# Patient Record
Sex: Female | Born: 1945 | Race: White | Hispanic: No | Marital: Married | Smoking: Never smoker
Health system: Southern US, Community
[De-identification: ages and names within clinical notes are randomized; demographics above are authoritative.]

## PROBLEM LIST (undated history)

## (undated) DIAGNOSIS — Z87440 Personal history of urinary (tract) infections: Secondary | ICD-10-CM

## (undated) DIAGNOSIS — I480 Paroxysmal atrial fibrillation: Secondary | ICD-10-CM

## (undated) DIAGNOSIS — N390 Urinary tract infection, site not specified: Secondary | ICD-10-CM

## (undated) DIAGNOSIS — K859 Acute pancreatitis without necrosis or infection, unspecified: Secondary | ICD-10-CM

## (undated) DIAGNOSIS — F32A Depression, unspecified: Secondary | ICD-10-CM

## (undated) DIAGNOSIS — K922 Gastrointestinal hemorrhage, unspecified: Secondary | ICD-10-CM

## (undated) DIAGNOSIS — K219 Gastro-esophageal reflux disease without esophagitis: Secondary | ICD-10-CM

## (undated) DIAGNOSIS — G629 Polyneuropathy, unspecified: Secondary | ICD-10-CM

## (undated) DIAGNOSIS — I5042 Chronic combined systolic (congestive) and diastolic (congestive) heart failure: Secondary | ICD-10-CM

## (undated) DIAGNOSIS — D649 Anemia, unspecified: Secondary | ICD-10-CM

## (undated) DIAGNOSIS — I10 Essential (primary) hypertension: Secondary | ICD-10-CM

## (undated) DIAGNOSIS — E785 Hyperlipidemia, unspecified: Secondary | ICD-10-CM

## (undated) DIAGNOSIS — F329 Major depressive disorder, single episode, unspecified: Secondary | ICD-10-CM

## (undated) DIAGNOSIS — I219 Acute myocardial infarction, unspecified: Secondary | ICD-10-CM

## (undated) DIAGNOSIS — S72001A Fracture of unspecified part of neck of right femur, initial encounter for closed fracture: Secondary | ICD-10-CM

## (undated) DIAGNOSIS — J9819 Other pulmonary collapse: Secondary | ICD-10-CM

## (undated) DIAGNOSIS — I340 Nonrheumatic mitral (valve) insufficiency: Secondary | ICD-10-CM

## (undated) DIAGNOSIS — J90 Pleural effusion, not elsewhere classified: Secondary | ICD-10-CM

## (undated) DIAGNOSIS — Z9289 Personal history of other medical treatment: Secondary | ICD-10-CM

## (undated) HISTORY — PX: APPENDECTOMY: SHX54

## (undated) HISTORY — PX: ESOPHAGOGASTRODUODENOSCOPY: SHX1529

## (undated) HISTORY — PX: OTHER SURGICAL HISTORY: SHX169

---
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
## 1974-02-26 HISTORY — PX: ABDOMINAL HYSTERECTOMY: SHX81

## 2001-01-15 ENCOUNTER — Encounter: Payer: Self-pay | Admitting: Neurology

## 2001-01-15 ENCOUNTER — Ambulatory Visit (HOSPITAL_COMMUNITY): Admission: RE | Admit: 2001-01-15 | Discharge: 2001-01-15 | Payer: Self-pay | Admitting: Neurology

## 2002-10-19 ENCOUNTER — Ambulatory Visit (HOSPITAL_COMMUNITY): Admission: RE | Admit: 2002-10-19 | Discharge: 2002-10-19 | Payer: Self-pay | Admitting: Ophthalmology

## 2002-10-19 ENCOUNTER — Encounter: Payer: Self-pay | Admitting: Ophthalmology

## 2005-10-15 ENCOUNTER — Ambulatory Visit (HOSPITAL_COMMUNITY): Admission: RE | Admit: 2005-10-15 | Discharge: 2005-10-15 | Payer: Self-pay | Admitting: Ophthalmology

## 2008-02-27 DIAGNOSIS — I219 Acute myocardial infarction, unspecified: Secondary | ICD-10-CM

## 2008-02-27 HISTORY — PX: CHOLECYSTECTOMY: SHX55

## 2008-02-27 HISTORY — PX: OTHER SURGICAL HISTORY: SHX169

## 2008-02-27 HISTORY — PX: CARDIAC CATHETERIZATION: SHX172

## 2008-02-27 HISTORY — DX: Acute myocardial infarction, unspecified: I21.9

## 2008-12-23 ENCOUNTER — Ambulatory Visit (HOSPITAL_COMMUNITY): Admission: RE | Admit: 2008-12-23 | Discharge: 2008-12-23 | Payer: Self-pay | Admitting: Ophthalmology

## 2009-01-19 ENCOUNTER — Ambulatory Visit: Payer: Self-pay | Admitting: Cardiology

## 2009-01-21 ENCOUNTER — Ambulatory Visit: Payer: Self-pay | Admitting: Cardiology

## 2009-01-21 ENCOUNTER — Ambulatory Visit: Payer: Self-pay | Admitting: Internal Medicine

## 2009-01-21 ENCOUNTER — Inpatient Hospital Stay (HOSPITAL_COMMUNITY): Admission: AD | Admit: 2009-01-21 | Discharge: 2009-01-31 | Payer: Self-pay | Admitting: Pulmonary Disease

## 2009-01-30 ENCOUNTER — Encounter (INDEPENDENT_AMBULATORY_CARE_PROVIDER_SITE_OTHER): Payer: Self-pay | Admitting: Family Medicine

## 2009-12-04 IMAGING — CR DG CHEST 1V PORT
1 series · 1 of 1 positions shown · non-contrast
Comparison: Portable chest x-ray of 01/24/2009

CLINICAL DATA: Respiratory failure, myocardial infarction

PORTABLE CHEST - 1 VIEW

[view not recorded]
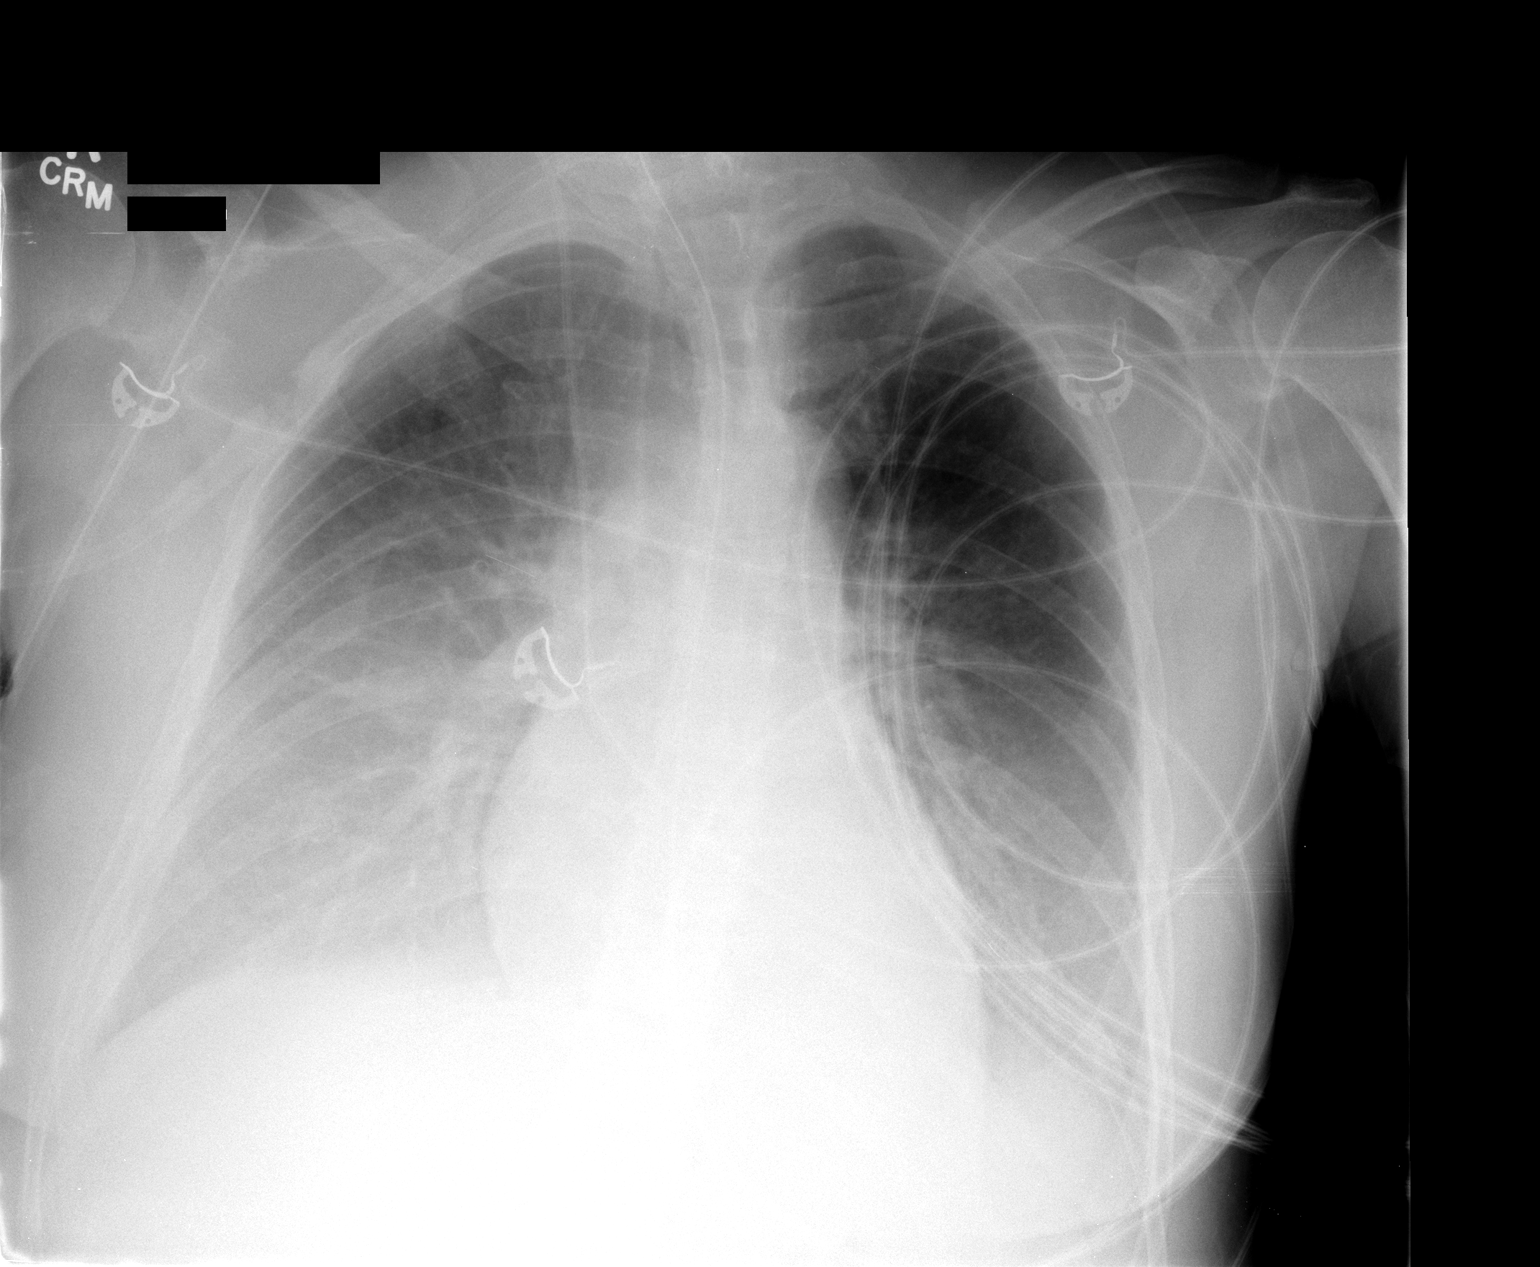

[1 of 1 positions shown; findings below may reference images not displayed]

FINDINGS: There is little change in probable effusions and mild
airspace disease.  Right IJ central venous catheter is unchanged in
position and an NG tube remains.  The heart is within normal limits
in size.
IMPRESSION: Little change in effusions and possible mild airspace disease.

## 2009-12-05 IMAGING — CR DG CHEST 1V PORT
1 series · 1 of 1 positions shown · non-contrast
Comparison: 01/25/2009

CLINICAL DATA: Pleural effusions.

PORTABLE CHEST - 1 VIEW

[view not recorded]
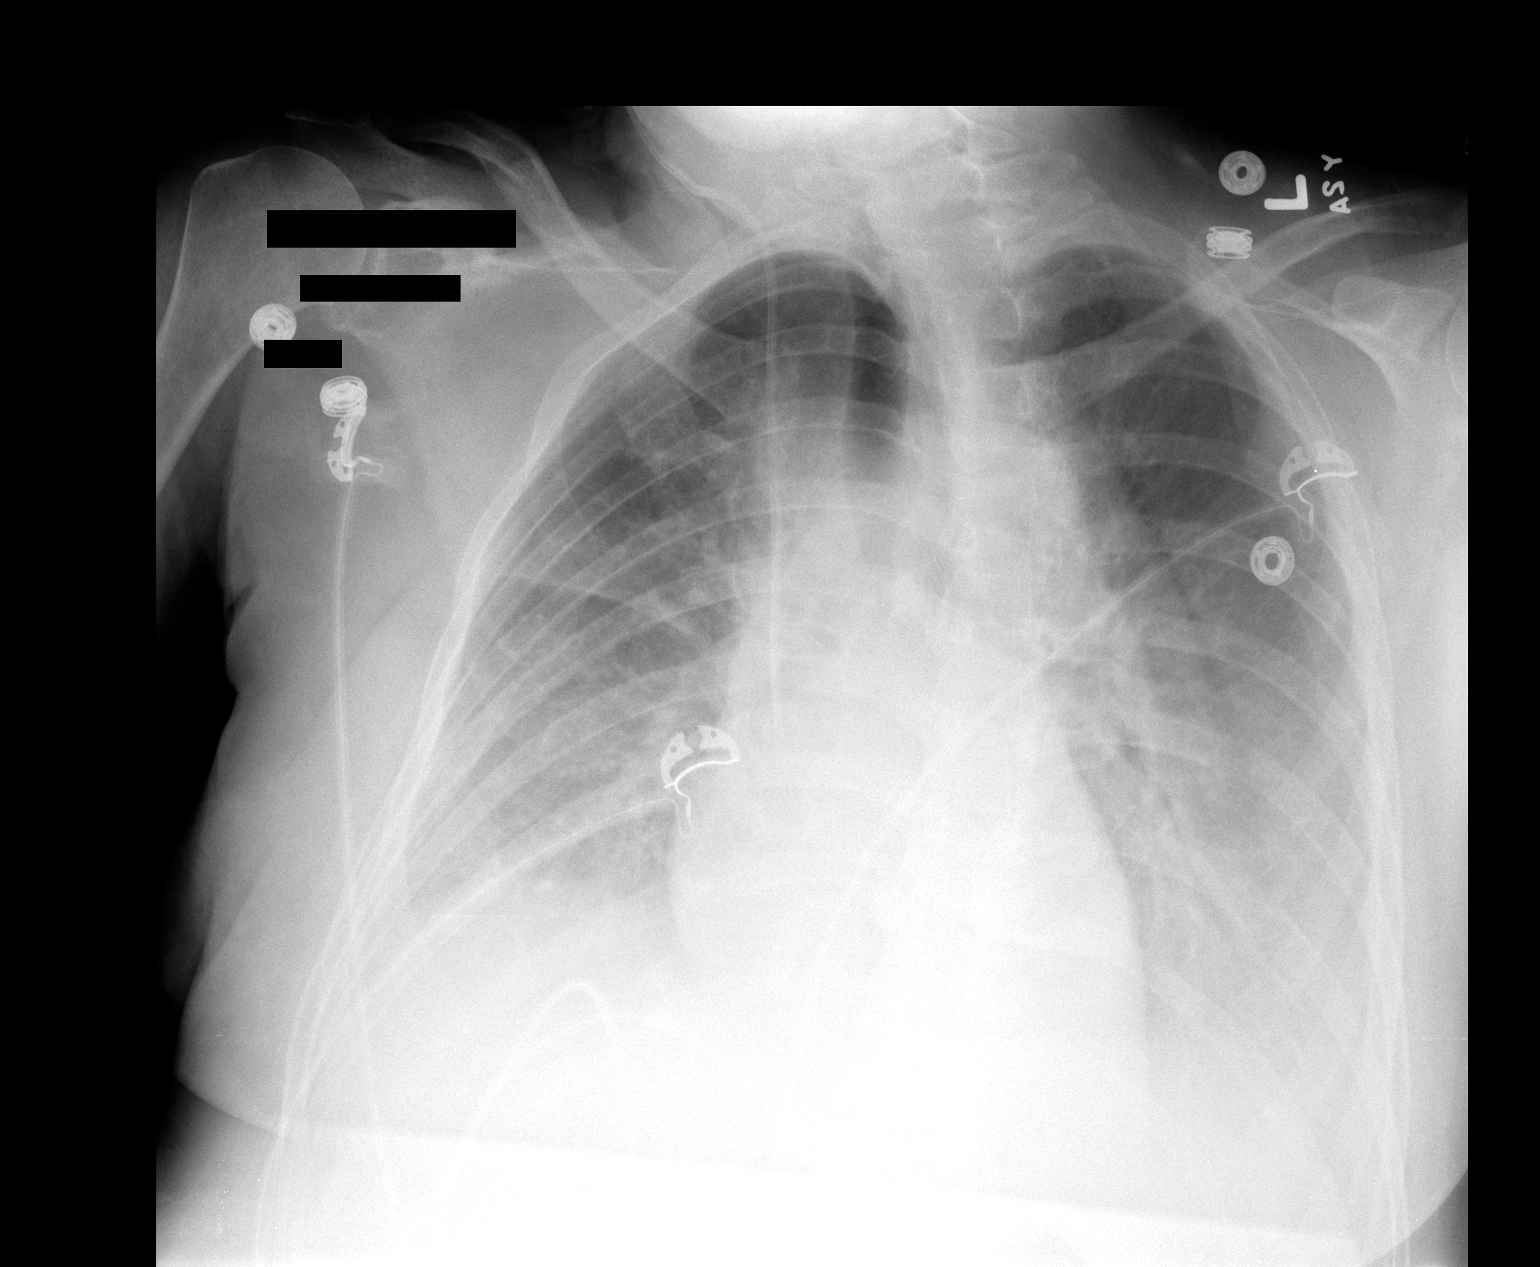

[1 of 1 positions shown; findings below may reference images not displayed]

FINDINGS: Layering bilateral pleural effusions again noted,
unchanged.  Bibasilar atelectasis present.  Right central line is
unchanged.  NG tube has been removed.
IMPRESSION: No significant change.

## 2010-05-30 LAB — DIFFERENTIAL
Basophils Absolute: 0 10*3/uL (ref 0.0–0.1)
Eosinophils Relative: 1 % (ref 0–5)
Lymphocytes Relative: 14 % (ref 12–46)
Lymphs Abs: 1.4 10*3/uL (ref 0.7–4.0)
Monocytes Absolute: 0.6 10*3/uL (ref 0.1–1.0)
Monocytes Relative: 6 % (ref 3–12)

## 2010-05-30 LAB — GLUCOSE, CAPILLARY
Glucose-Capillary: 116 mg/dL — ABNORMAL HIGH (ref 70–99)
Glucose-Capillary: 128 mg/dL — ABNORMAL HIGH (ref 70–99)
Glucose-Capillary: 150 mg/dL — ABNORMAL HIGH (ref 70–99)
Glucose-Capillary: 153 mg/dL — ABNORMAL HIGH (ref 70–99)
Glucose-Capillary: 164 mg/dL — ABNORMAL HIGH (ref 70–99)
Glucose-Capillary: 167 mg/dL — ABNORMAL HIGH (ref 70–99)
Glucose-Capillary: 169 mg/dL — ABNORMAL HIGH (ref 70–99)
Glucose-Capillary: 186 mg/dL — ABNORMAL HIGH (ref 70–99)
Glucose-Capillary: 187 mg/dL — ABNORMAL HIGH (ref 70–99)
Glucose-Capillary: 56 mg/dL — ABNORMAL LOW (ref 70–99)
Glucose-Capillary: 68 mg/dL — ABNORMAL LOW (ref 70–99)
Glucose-Capillary: 79 mg/dL (ref 70–99)

## 2010-05-30 LAB — BASIC METABOLIC PANEL
BUN: 18 mg/dL (ref 6–23)
BUN: 19 mg/dL (ref 6–23)
CO2: 24 mEq/L (ref 19–32)
CO2: 25 mEq/L (ref 19–32)
CO2: 25 mEq/L (ref 19–32)
CO2: 27 mEq/L (ref 19–32)
Calcium: 7.8 mg/dL — ABNORMAL LOW (ref 8.4–10.5)
Calcium: 8.6 mg/dL (ref 8.4–10.5)
Chloride: 106 mEq/L (ref 96–112)
Chloride: 107 mEq/L (ref 96–112)
Creatinine, Ser: 1.08 mg/dL (ref 0.4–1.2)
Creatinine, Ser: 1.22 mg/dL — ABNORMAL HIGH (ref 0.4–1.2)
GFR calc Af Amer: 54 mL/min — ABNORMAL LOW (ref >60)
GFR calc Af Amer: 59 mL/min — ABNORMAL LOW (ref >60)
Glucose, Bld: 32 mg/dL — CL (ref 70–99)
Sodium: 137 mEq/L (ref 135–145)
Sodium: 139 mEq/L (ref 135–145)

## 2010-05-30 LAB — CBC
HCT: 34.5 % — ABNORMAL LOW (ref 36.0–46.0)
Hemoglobin: 12 g/dL (ref 12.0–15.0)
MCHC: 34.9 g/dL (ref 30.0–36.0)
MCHC: 35.2 g/dL (ref 30.0–36.0)
MCV: 89.6 fL (ref 78.0–100.0)
MCV: 90.8 fL (ref 78.0–100.0)
Platelets: 133 10*3/uL — ABNORMAL LOW (ref 150–400)
Platelets: 151 10*3/uL (ref 150–400)
RBC: 3.77 MIL/uL — ABNORMAL LOW (ref 3.87–5.11)
RBC: 3.79 MIL/uL — ABNORMAL LOW (ref 3.87–5.11)
RDW: 12.8 % (ref 11.5–15.5)
WBC: 9.9 10*3/uL (ref 4.0–10.5)

## 2010-05-30 LAB — COMPREHENSIVE METABOLIC PANEL
AST: 25 U/L (ref 0–37)
Albumin: 2.5 g/dL — ABNORMAL LOW (ref 3.5–5.2)
BUN: 19 mg/dL (ref 6–23)
CO2: 26 mEq/L (ref 19–32)
Chloride: 107 mEq/L (ref 96–112)
Chloride: 111 mEq/L (ref 96–112)
Creatinine, Ser: 1.09 mg/dL (ref 0.4–1.2)
Creatinine, Ser: 1.11 mg/dL (ref 0.4–1.2)
GFR calc Af Amer: 60 mL/min — ABNORMAL LOW (ref >60)
GFR calc non Af Amer: 51 mL/min — ABNORMAL LOW (ref >60)
Total Bilirubin: 0.5 mg/dL (ref 0.3–1.2)
Total Bilirubin: 0.6 mg/dL (ref 0.3–1.2)
Total Protein: 5.5 g/dL — ABNORMAL LOW (ref 6.0–8.3)

## 2010-05-30 LAB — PHOSPHORUS
Phosphorus: 2.7 mg/dL (ref 2.3–4.6)
Phosphorus: 4 mg/dL (ref 2.3–4.6)

## 2010-05-30 LAB — CHOLESTEROL, TOTAL: Cholesterol: 130 mg/dL (ref 0–200)

## 2010-05-30 LAB — MAGNESIUM: Magnesium: 1.7 mg/dL (ref 1.5–2.5)

## 2010-05-30 LAB — TRIGLYCERIDES: Triglycerides: 185 mg/dL — ABNORMAL HIGH (ref <150)

## 2010-05-30 LAB — PROTIME-INR: INR: 1.1 (ref 0.00–1.49)

## 2010-05-31 LAB — POCT I-STAT 3, ART BLOOD GAS (G3+)
Acid-base deficit: 11 mmol/L — ABNORMAL HIGH (ref 0.0–2.0)
Acid-base deficit: 11 mmol/L — ABNORMAL HIGH (ref 0.0–2.0)
Acid-base deficit: 9 mmol/L — ABNORMAL HIGH (ref 0.0–2.0)
Acid-base deficit: 9 mmol/L — ABNORMAL HIGH (ref 0.0–2.0)
Bicarbonate: 15.3 mEq/L — ABNORMAL LOW (ref 20.0–24.0)
O2 Saturation: 97 %
O2 Saturation: 98 %
O2 Saturation: 98 %
Patient temperature: 97.4
Patient temperature: 97.4
Patient temperature: 97.6
Patient temperature: 98.7
TCO2: 15 mmol/L (ref 0–100)
pCO2 arterial: 31.2 mmHg — ABNORMAL LOW (ref 35.0–45.0)
pH, Arterial: 7.31 — ABNORMAL LOW (ref 7.350–7.400)
pH, Arterial: 7.319 — ABNORMAL LOW (ref 7.350–7.400)
pO2, Arterial: 109 mmHg — ABNORMAL HIGH (ref 80.0–100.0)
pO2, Arterial: 79 mmHg — ABNORMAL LOW (ref 80.0–100.0)
pO2, Arterial: 85 mmHg (ref 80.0–100.0)

## 2010-05-31 LAB — CARDIAC PANEL(CRET KIN+CKTOT+MB+TROPI)
CK, MB: 3.3 ng/mL (ref 0.3–4.0)
CK, MB: 4.1 ng/mL — ABNORMAL HIGH (ref 0.3–4.0)
CK, MB: 5.6 ng/mL — ABNORMAL HIGH (ref 0.3–4.0)
Relative Index: INVALID (ref 0.0–2.5)
Relative Index: INVALID (ref 0.0–2.5)
Total CK: 63 U/L (ref 7–177)
Total CK: 81 U/L (ref 7–177)
Troponin I: 0.39 ng/mL — ABNORMAL HIGH (ref 0.00–0.06)
Troponin I: 0.51 ng/mL (ref 0.00–0.06)
Troponin I: 0.92 ng/mL (ref 0.00–0.06)

## 2010-05-31 LAB — GLUCOSE, CAPILLARY
Glucose-Capillary: 109 mg/dL — ABNORMAL HIGH (ref 70–99)
Glucose-Capillary: 123 mg/dL — ABNORMAL HIGH (ref 70–99)
Glucose-Capillary: 123 mg/dL — ABNORMAL HIGH (ref 70–99)
Glucose-Capillary: 125 mg/dL — ABNORMAL HIGH (ref 70–99)
Glucose-Capillary: 130 mg/dL — ABNORMAL HIGH (ref 70–99)
Glucose-Capillary: 140 mg/dL — ABNORMAL HIGH (ref 70–99)
Glucose-Capillary: 140 mg/dL — ABNORMAL HIGH (ref 70–99)
Glucose-Capillary: 141 mg/dL — ABNORMAL HIGH (ref 70–99)
Glucose-Capillary: 143 mg/dL — ABNORMAL HIGH (ref 70–99)
Glucose-Capillary: 157 mg/dL — ABNORMAL HIGH (ref 70–99)
Glucose-Capillary: 159 mg/dL — ABNORMAL HIGH (ref 70–99)
Glucose-Capillary: 174 mg/dL — ABNORMAL HIGH (ref 70–99)
Glucose-Capillary: 176 mg/dL — ABNORMAL HIGH (ref 70–99)
Glucose-Capillary: 182 mg/dL — ABNORMAL HIGH (ref 70–99)
Glucose-Capillary: 185 mg/dL — ABNORMAL HIGH (ref 70–99)
Glucose-Capillary: 203 mg/dL — ABNORMAL HIGH (ref 70–99)
Glucose-Capillary: 213 mg/dL — ABNORMAL HIGH (ref 70–99)
Glucose-Capillary: 213 mg/dL — ABNORMAL HIGH (ref 70–99)
Glucose-Capillary: 218 mg/dL — ABNORMAL HIGH (ref 70–99)
Glucose-Capillary: 231 mg/dL — ABNORMAL HIGH (ref 70–99)
Glucose-Capillary: 245 mg/dL — ABNORMAL HIGH (ref 70–99)
Glucose-Capillary: 246 mg/dL — ABNORMAL HIGH (ref 70–99)
Glucose-Capillary: 284 mg/dL — ABNORMAL HIGH (ref 70–99)
Glucose-Capillary: 315 mg/dL — ABNORMAL HIGH (ref 70–99)

## 2010-05-31 LAB — DIFFERENTIAL
Basophils Relative: 0 % (ref 0–1)
Eosinophils Absolute: 0 10*3/uL (ref 0.0–0.7)
Eosinophils Relative: 0 % (ref 0–5)
Eosinophils Relative: 0 % (ref 0–5)
Lymphocytes Relative: 5 % — ABNORMAL LOW (ref 12–46)
Lymphs Abs: 0.5 10*3/uL — ABNORMAL LOW (ref 0.7–4.0)
Monocytes Absolute: 0.4 10*3/uL (ref 0.1–1.0)
Monocytes Absolute: 0.8 10*3/uL (ref 0.1–1.0)
Monocytes Relative: 10 % (ref 3–12)
Monocytes Relative: 5 % (ref 3–12)
Neutro Abs: 7.9 10*3/uL — ABNORMAL HIGH (ref 1.7–7.7)

## 2010-05-31 LAB — COMPREHENSIVE METABOLIC PANEL
ALT: 21 U/L (ref 0–35)
AST: 14 U/L (ref 0–37)
Albumin: 2.2 g/dL — ABNORMAL LOW (ref 3.5–5.2)
Albumin: 2.2 g/dL — ABNORMAL LOW (ref 3.5–5.2)
Albumin: 2.3 g/dL — ABNORMAL LOW (ref 3.5–5.2)
Alkaline Phosphatase: 40 U/L (ref 39–117)
Alkaline Phosphatase: 51 U/L (ref 39–117)
BUN: 41 mg/dL — ABNORMAL HIGH (ref 6–23)
BUN: 49 mg/dL — ABNORMAL HIGH (ref 6–23)
Calcium: 6.1 mg/dL — CL (ref 8.4–10.5)
Chloride: 119 mEq/L — ABNORMAL HIGH (ref 96–112)
Creatinine, Ser: 1.39 mg/dL — ABNORMAL HIGH (ref 0.4–1.2)
GFR calc Af Amer: 46 mL/min — ABNORMAL LOW (ref >60)
Potassium: 3 mEq/L — ABNORMAL LOW (ref 3.5–5.1)
Potassium: 3.5 mEq/L (ref 3.5–5.1)
Sodium: 142 mEq/L (ref 135–145)
Total Protein: 4.6 g/dL — ABNORMAL LOW (ref 6.0–8.3)
Total Protein: 4.6 g/dL — ABNORMAL LOW (ref 6.0–8.3)
Total Protein: 4.9 g/dL — ABNORMAL LOW (ref 6.0–8.3)

## 2010-05-31 LAB — PHOSPHORUS
Phosphorus: 2.3 mg/dL (ref 2.3–4.6)
Phosphorus: 2.5 mg/dL (ref 2.3–4.6)
Phosphorus: 2.9 mg/dL (ref 2.3–4.6)
Phosphorus: 3 mg/dL (ref 2.3–4.6)

## 2010-05-31 LAB — URINALYSIS, ROUTINE W REFLEX MICROSCOPIC
Nitrite: POSITIVE — AB
Specific Gravity, Urine: 1.021 (ref 1.005–1.030)
Urobilinogen, UA: 1 mg/dL (ref 0.0–1.0)

## 2010-05-31 LAB — CBC
HCT: 30.5 % — ABNORMAL LOW (ref 36.0–46.0)
HCT: 31.3 % — ABNORMAL LOW (ref 36.0–46.0)
HCT: 33.1 % — ABNORMAL LOW (ref 36.0–46.0)
Hemoglobin: 11.1 g/dL — ABNORMAL LOW (ref 12.0–15.0)
Hemoglobin: 11.7 g/dL — ABNORMAL LOW (ref 12.0–15.0)
MCHC: 35.3 g/dL (ref 30.0–36.0)
MCV: 92.3 fL (ref 78.0–100.0)
Platelets: 101 10*3/uL — ABNORMAL LOW (ref 150–400)
Platelets: 107 10*3/uL — ABNORMAL LOW (ref 150–400)
Platelets: 120 10*3/uL — ABNORMAL LOW (ref 150–400)
Platelets: 127 10*3/uL — ABNORMAL LOW (ref 150–400)
RDW: 13 % (ref 11.5–15.5)
RDW: 13.3 % (ref 11.5–15.5)
RDW: 13.3 % (ref 11.5–15.5)
RDW: 13.4 % (ref 11.5–15.5)
RDW: 13.7 % (ref 11.5–15.5)
WBC: 12 10*3/uL — ABNORMAL HIGH (ref 4.0–10.5)
WBC: 7.9 10*3/uL (ref 4.0–10.5)
WBC: 8.8 10*3/uL (ref 4.0–10.5)

## 2010-05-31 LAB — BASIC METABOLIC PANEL
CO2: 27 mEq/L (ref 19–32)
Chloride: 119 mEq/L — ABNORMAL HIGH (ref 96–112)
GFR calc non Af Amer: 28 mL/min — ABNORMAL LOW (ref >60)
GFR calc non Af Amer: 57 mL/min — ABNORMAL LOW (ref >60)
Glucose, Bld: 273 mg/dL — ABNORMAL HIGH (ref 70–99)
Glucose, Bld: 92 mg/dL (ref 70–99)
Potassium: 3.3 mEq/L — ABNORMAL LOW (ref 3.5–5.1)
Potassium: 4.1 mEq/L (ref 3.5–5.1)
Sodium: 140 mEq/L (ref 135–145)
Sodium: 140 mEq/L (ref 135–145)

## 2010-05-31 LAB — APTT: aPTT: 33 seconds (ref 24–37)

## 2010-05-31 LAB — CULTURE, BAL-QUANTITATIVE W GRAM STAIN

## 2010-05-31 LAB — AMYLASE: Amylase: 293 U/L — ABNORMAL HIGH (ref 27–131)

## 2010-05-31 LAB — URINE CULTURE: Culture: NO GROWTH

## 2010-05-31 LAB — CULTURE, BLOOD (ROUTINE X 2): Culture: NO GROWTH

## 2010-05-31 LAB — MRSA PCR SCREENING: MRSA by PCR: NEGATIVE

## 2010-05-31 LAB — PROTIME-INR
INR: 1.45 (ref 0.00–1.49)
Prothrombin Time: 17.5 seconds — ABNORMAL HIGH (ref 11.6–15.2)
Prothrombin Time: 18.4 seconds — ABNORMAL HIGH (ref 11.6–15.2)

## 2010-05-31 LAB — URINE MICROSCOPIC-ADD ON

## 2010-05-31 LAB — CHOLESTEROL, TOTAL: Cholesterol: 122 mg/dL (ref 0–200)

## 2010-05-31 LAB — TRIGLYCERIDES
Triglycerides: 117 mg/dL (ref <150)
Triglycerides: 125 mg/dL (ref <150)

## 2010-05-31 LAB — MAGNESIUM: Magnesium: 1.3 mg/dL — ABNORMAL LOW (ref 1.5–2.5)

## 2010-05-31 LAB — BRAIN NATRIURETIC PEPTIDE: Pro B Natriuretic peptide (BNP): 255 pg/mL — ABNORMAL HIGH (ref 0.0–100.0)

## 2010-05-31 LAB — CARBOXYHEMOGLOBIN: Carboxyhemoglobin: 0.8 % (ref 0.5–1.5)

## 2010-06-01 LAB — BASIC METABOLIC PANEL
CO2: 27 mEq/L (ref 19–32)
Chloride: 108 mEq/L (ref 96–112)
GFR calc Af Amer: 55 mL/min — ABNORMAL LOW (ref >60)
Potassium: 3.8 mEq/L (ref 3.5–5.1)
Sodium: 142 mEq/L (ref 135–145)

## 2010-06-01 LAB — HEMOGLOBIN AND HEMATOCRIT, BLOOD: HCT: 28.9 % — ABNORMAL LOW (ref 36.0–46.0)

## 2010-06-01 LAB — GLUCOSE, CAPILLARY: Glucose-Capillary: 154 mg/dL — ABNORMAL HIGH (ref 70–99)

## 2010-07-14 NOTE — Op Note (Signed)
NAME:  Vanessa Webb, Vanessa Webb                           ACCOUNT NO.:  [id number]   MEDICAL RECORD NO.:  [id number]                   PATIENT TYPE:  OIB   LOCATION:  2550                                 FACILITY:  [facility]   PHYSICIAN:  Clent Demark. Rankin, M.D.                DATE OF BIRTH:  09/11/1945   DATE OF PROCEDURE:  DATE OF DISCHARGE:  10/19/2002                                 OPERATIVE REPORT   PREOPERATIVE DIAGNOSES:  1. Dense vitreous hemorrhage, left eye, nonclearing.  2. Tractional detachment macular region, left eye.  3. Epiretinal membrane, left eye.  4. 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   POSTOPERATIVE DIAGNOSES:  1. Dense vitreous hemorrhage, left eye, nonclearing.  2. Tractional detachment macular region, left eye.  3. Epiretinal membrane left eye.  4. 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   PROCEDURE:  1. Posterior vitrectomy and membrane peel, left eye.  2. Endolaser pan photocoagulation, left eye.   SURGEON:  Clent Demark. Rankin, M.D.   ANESTHESIA:  Retrobulbar, __________.   INDICATIONS:  The patient is a 65 year old woman who has profound vision  loss, including vitreous hemorrhage in the left eye.  This is an attempt to  clear the vitreous hemorrhage, clear the visual axis, release visual retinal  traction, and induce quiescence of proliferative diabetic retinopathy.  She  understands the risks of anesthesia, including the rare occurrence of death  as well as to the eye, including, but not limited to hemorrhage, infection,  scarring, need for another surgery, no change in vision, loss in vision and  progression of disease despite intervention.  Appropriate signed consent was  obtained.  She was taken to the operating room.  In the operating room,  appropriate monitors followed by mild sedation and 0.75% Marcaine delivered  5 mL retrobulbar and an additional 5 mL laterally in the fashion of modified  Helyn App.  The  left ocular region was sterilely prepped and draped in the  usual ophthalmic fashion with the lid speculum applied.  Conjunctiva  peritomy was then fashioned temporally and supranasally 4 mm posterior to  the limbus in the infratemporal quadrant.  Placement was noted in the  vitreous cavity, verified visually.  Superior sclerotomy was then fashioned.  [employer] was placed in position with Biom attachment.  Posterior  vitrectomy was then begun.  Posterior hyoid was engaged and removed in a  modified on-block fashion and delamination was used to remove dense fibrous  attachment on the supratemporal arcade.  No complications occurred.  No  holes were formed.  Peripheral vitreous skirt was then elevated and trimmed  360 degrees.  Preretinal hemorrhage was aspirated.  Endolaser  photocoagulation was placed 360 degrees.  Hemostasis was spontaneous.  Instruments were removed from the eye.  Scleral depression was then used to  confirm that there were no retinal tears or holes.  Sclerotomies  were then  closed with 7-0 Vicryl suture.  The infusion was  removed and sclerotomy closed with 7-0 Vicryl suture.  Conjunctiva closed  with 7-0 Vicryl.  Subconjuctival injections of antibiotics applied.  The  patient tolerated the procedure well without complications.  She was taken  to the short stay area and discharged home as an outpatient.                                               Clent Demark Rankin, M.D.    GAR/MEDQ  D:  10/19/2002  T:  10/20/2002  Job:  [id number]

## 2010-07-14 NOTE — Op Note (Signed)
Vanessa Webb, WEYRAUCH                 ACCOUNT NO.:  [id number]   MEDICAL RECORD NO.:  [id number]          PATIENT TYPE:  AMB   LOCATION:  SDS                          FACILITY:  [facility]   PHYSICIAN:  Dominica Severin A. Rankin, M.D.   DATE OF BIRTH:  Sep 16, 1945   DATE OF PROCEDURE:  10/15/2005  DATE OF DISCHARGE:                                 OPERATIVE REPORT   PREOPERATIVE DIAGNOSES:  1. Epiretinal membrane right eye.  2. Progressive proliferative diabetes retinopathy, right eye, with      fibrovascular proliferation of the optic nerve, macular region.  3. Tractional detachment, nasal right eye.   POSTOPERATIVE DIAGNOSES:  1. Membrane right eye.  2. Progressive  proliferative , right eye, with fibrovascular      proliferation of the optic nerve, macular region.  3. Tractional detachment, nasal right eye.  4. Vitreous hemorrhage, right eye, mild.   PROCEDURE:  1. Posterior vitrectomy, membrane peel, proliferative diabetic retinopathy      tissue as well as epiretinal membrane right eye, 25-gauge.  2. Endolaser pan photocoagulation right eye.   SURGEON:  Clent Demark. Rankin, M.D.   ANESTHESIA:  Local retrobulbar anesthesia control.   INDICATIONS:  The patient is a 65 year old woman who has profound visual  loss threatened by epiretinal membrane with tractional retinal detachment  changes, proliferative tissue changes on the optic nerve as well as nasally.   The patient understands this is an attempt to release the traction.  She  also understand this is an attempt to reduce chances of permanent vision  loss on the basis of diabetic retinopathy and epiretinal membrane.  She  understands this is an attempt to induce quiescence of retinopathy because  of the progressive proliferative disease.  Appropriate signed consent was  obtained.  The patient was taken to the operating room.  She understood the  risks including but not limited to hemorrhage, infection, scarring, need for  more surgery, no  change in vision, loss of vision, progressive disease  despite intervention.  Consent was obtained and she was taken to the  operating room.   DESCRIPTION OF PROCEDURE:  In the operating room appropriate monitoring was  followed by mild sedation.  Marcaine 0.75% delivered 5 mL retrobulbar  followed by an additional 5 mL laterally in the fashion of modified Kirk Ruths.  The right periocular region was sterilely prepped and draped in the  usual ophthalmic fashion.  A lid speculum applied.  A 25-gauge trocar system  was then used to introduce the infusion.  Superior trocars applied.  Core  vitrectomy was then begun.  Peripheral vitreous was then circumcised 360  degrees.  Posterior hyaloid is removed in a modified [employer] technique and  a large sheet of fibrovascular tissue is moved off the optic nerve and its  attachments to the optic nerve was amputated in order to avoid closure of  the normal retinal vessels.  Laser photocoagulation was later then used on  the stump to provide hemostasis.  Fibrous tissue was then removed and  elevated anterior to the __________ 360 degrees.  Tractional changes  in the  optic nerve were released.  No holes were identified and therefore retinal  detachment did not need to treated further.   At this time peripheral vitreous skirt was trimmed to 360 degrees.  Hemostasis was obtained with laser photocoagulation.  Pan photocoagulation  was placed peripherally as well as posteriorly.  Membrane forceps were then  used to engage the epiretinal membrane and this was removed without  difficulty off the macular region.  At this time instruments were removed  from the eye.  Peripheral retina was inspected and found to be free of  retinal holes and tears.  Trocar, superior trocar was removed.  The inferior  trocar was removed. Subconjunctival Decadron applied.  A sterile patch and  Fox shield applied.  The patient tolerated the procedure well without   complications.      Clent Demark Rankin, M.D.  Electronically Signed     GAR/MEDQ  D:  10/15/2005  T:  10/15/2005  Job:  [id number]

## 2011-02-16 ENCOUNTER — Encounter (HOSPITAL_COMMUNITY): Payer: Self-pay

## 2011-02-21 ENCOUNTER — Encounter (HOSPITAL_COMMUNITY)
Admission: RE | Admit: 2011-02-21 | Discharge: 2011-02-21 | Disposition: A | Discharge status: Home / Self Care | Payer: Medicare Other | Source: Ambulatory Visit | Attending: Ophthalmology | Admitting: Ophthalmology

## 2011-02-21 ENCOUNTER — Other Ambulatory Visit: Payer: Self-pay

## 2011-02-21 ENCOUNTER — Encounter (HOSPITAL_COMMUNITY): Payer: Self-pay

## 2011-02-21 HISTORY — DX: Gastro-esophageal reflux disease without esophagitis: K21.9

## 2011-02-21 HISTORY — DX: Acute myocardial infarction, unspecified: I21.9

## 2011-02-21 HISTORY — DX: Essential (primary) hypertension: I10

## 2011-02-21 HISTORY — DX: Urinary tract infection, site not specified: N39.0

## 2011-02-21 LAB — BASIC METABOLIC PANEL
CO2: 25 mEq/L (ref 19–32)
Glucose, Bld: 245 mg/dL — ABNORMAL HIGH (ref 70–99)
Potassium: 3.7 mEq/L (ref 3.5–5.1)
Sodium: 142 mEq/L (ref 135–145)

## 2011-02-21 LAB — CBC
Hemoglobin: 9.7 g/dL — ABNORMAL LOW (ref 12.0–15.0)
RBC: 3.38 MIL/uL — ABNORMAL LOW (ref 3.87–5.11)
WBC: 6.1 10*3/uL (ref 4.0–10.5)

## 2011-02-21 NOTE — Patient Instructions (Addendum)
[place]  02/21/2011   Your procedure is scheduled on:   03/01/2011  Report to [facility] at 32  AM.  Call this number if you have problems the morning of surgery: [phone]   Remember:   Do not eat food:After Midnight.  May have clear liquids:until Midnight .  Clear liquids include soda, tea, black coffee, apple or grape juice, broth.  Take these medicines the morning of surgery with A SIP OF WATER: carvediolo,fludrocortisone,metoclopramide,omeprazole. Take 1/2 Lantus dosage night before your surgery.   Do not wear jewelry, make-up or nail polish.  Do not wear lotions, powders, or perfumes. You may wear deodorant.  Do not shave 48 hours prior to surgery.  Do not bring valuables to the hospital.  Contacts, dentures or bridgework may not be worn into surgery.  Leave suitcase in the car. After surgery it may be brought to your room.  For patients admitted to the hospital, checkout time is 11:00 AM the day of discharge.   Patients discharged the day of surgery will not be allowed to drive home.  Name and phone number of your driver: family  Special Instructions: N/A   Please read over the following fact sheets that you were given: Pain Booklet, Surgical Site Infection Prevention, Anesthesia Post-op Instructions and Care and Recovery After Surgery Cataract A cataract is a clouding of the lens of the eye. It is most often related to aging. A cataract is not a "film" over the surface of the eye. The lens is inside the eye and changes size of the pupil. The lens can enlarge to let more light enter the eye in dark environments and contract the size of the pupil to let in bright light. The lens is the part of the eye that helps focus light on the retina. The retina is the eye's light-sensitive layer. It is in the back of the eye that sends visual signals to the brain. In a normal eye, light passes through the lens and gets focused on the retina. To help produce a sharp image, the lens must  remain clear. When a lens becomes cloudy, vision is compromised by the degree and nature of the clouding. Certain cataracts make people more near-sighted as they develop, others increase glare, and all reduce vision to some degree or another. A cataract that is so dense that it becomes milky white and a white opacity can be seen through the pupil. When the white color is seen, it is called a "mature" or "hyper-mature cataract." Such cataracts cause total blindness in the affected eye. The cataract must be removed to prevent damage to the eye itself. Some types of cataracts can cause a secondary disease of the eye, such as certain types of glaucoma. In the early stages, better lighting and eyeglasses may lessen vision problems caused by cataracts. At a certain point, surgery may be needed to improve vision. CAUSES   Aging. However, cataracts may occur at any age, even in newborns.   Certain drugs.   Trauma to the eye.   Certain diseases (such as diabetes).   Inherited or acquired medical syndromes.  SYMPTOMS   Gradual, progressive drop in vision in the affected eye. Cataracts may develop at different rates in each eye. Cataracts may even be in just one eye with the other unaffected.   Cataracts due to trauma may develop quickly, sometimes over a matter or days or even hours. The result is severe and rapid visual loss.  DIAGNOSIS  To detect  a cataract, an eye doctor examines the lens. A well developed cataract can be diagnosed without dilating the pupil. Early cataracts and others of a specific nature are best diagnosed with an exam of the eyes with the pupils dilated by drops. TREATMENT   For an early cataract, vision may improve by using different eyeglasses or stronger lighting.   If the above measures do not help, surgery is the only effective treatment. This treatment removes the cloudy lens and replaces it with a substitute lens (Intraocular lens, or IOL). Newly developed IOL technology  allows the implanted lens to improve vision both at a distance and up close. Discuss with your eye surgeon about the possibility of still needing glasses. Also discuss how visual coordination between both eyes will be affected.  A cataract needs to be removed only when vision loss interferes with your everyday activities such as driving, reading or watching TV. You and your eye doctor can make that decision together. In most cases, waiting until you are ready to have cataract surgery will not harm your eye. If you have cataracts in both eyes, only one should be removed at a time. This allows the operated eye to heal and be out of danger from serious problems (such as infection or poor wound healing) before having the other eye undergo surgery.  Sometimes, a cataract should be removed even if it does not cause problems with your vision. For example, a cataract should be removed if it prevents examination or treatment of another eye problem. Just as you cannot see out of the affected eye well, your doctor cannot see into your eye well through a cataract. The vast majority of people who have cataract surgery have better vision afterward. CATARACT REMOVAL There are two primary ways to remove a cataract. Your doctor can explain the differences and help determine which is best for you:  Phacoemulsification (small incision cataract surgery). This involves making a small cut (incision) on the edge of the clear, dome-shaped surface that covers the front of the eye (the cornea). An injection behind the eye or eye drops are given to make this a painless procedure. The doctor then inserts a tiny probe into the eye. This device emits ultrasound waves that soften and break up the cloudy center of the lens so it can be removed by suction. Most cataract surgery is done this way. The cuts are usually so small and performed in such a manner that often no sutures are needed to keep it closed.   Extracapsular surgery. Your  doctor makes a slightly longer incision on the side of the cornea. The doctor removes the hard center of the lens. The remainder of the lens is then removed by suction. In some cases, extremely fine sutures are needed which the doctor may, or may not remove in the office after the surgery.  When an IOL is implanted, it needs no care. It becomes a permanent part of your eye and cannot be seen or felt.  Some people cannot have an IOL. They may have problems during surgery, or maybe they have another eye disease. For these people, a soft contact lens may be suggested. If an IOL or contact lens cannot be used, very powerful and thick glasses are required after surgery. Since vision is very different through such thick glasses, it is important to have your doctor discuss the impact on your vision after any cataract surgery where there is no plan to implant an IOL. The normal lens of the  eye is covered by a clear capsule. Both phacoemulsification and extracapsular surgery require that the back surface of this lens capsule be left in place. This helps support IOLs and prevents the IOL from dislocating and falling back into the deeper interior of the eye. Right after surgery, and often permanently this "posterior capsule" remains clear. In some cases however, it can become cloudy, presenting the same type of visual compromise that the original cataract did since light is again obstructed as it passes through the clear IOL. This condition is often referred to as an "after-cataract." Fortunately, after-cataracts are easily treated using a painless and very fast laser treatment that is performed without anesthesia or incisions. It is done in a matter of minutes in an outpatient environment. Visual improvement is often immediate.  HOME CARE INSTRUCTIONS   Your surgeon will discuss pre and post operative care with you prior to surgery. The majority of people are able to do almost all normal activities right away. Although,  it is often advised to avoid strenuous activity for a period of time.   Postoperative drops and careful avoidance of infection will be needed. Many surgeons suggest the use of a protective shield during the first few days after surgery.   There is a very small incidence of complication from modern cataract surgery, but it can happen. Infection that spreads to the inside of the eye (endophthalmitis) can result in total visual loss and even loss of the eye itself. In extremely rare instances, the inflammation of endophthalmitis can spread to both eyes (sympathetic ophthalmia). Appropriate post-operative care under the close observation of your surgeon is essential to a successful outcome.  SEEK IMMEDIATE MEDICAL CARE IF:   You have any sudden drop of vision in the operated eye.   You have pain in the operated eye.   You see a large number of floating dots in the field of vision in the operated eye.   You see flashing lights, or if a portion of your side vision in any direction appears black (like a curtain being drawn into your field of vision) in the operated eye.  Document Released: 02/12/2005 Document Revised: 10/25/2010 Document Reviewed: 03/31/2007 [facility] Patient Information 2012 [place].PATIENT INSTRUCTIONS POST-ANESTHESIA  IMMEDIATELY FOLLOWING SURGERY:  Do not drive or operate machinery for the first twenty four hours after surgery.  Do not make any important decisions for twenty four hours after surgery or while taking narcotic pain medications or sedatives.  If you develop intractable nausea and vomiting or a severe headache please notify your doctor immediately.  FOLLOW-UP:  Please make an appointment with your surgeon as instructed. You do not need to follow up with anesthesia unless specifically instructed to do so.  WOUND CARE INSTRUCTIONS (if applicable):  Keep a dry clean dressing on the anesthesia/puncture wound site if there is drainage.  Once the wound has quit  draining you may leave it open to air.  Generally you should leave the bandage intact for twenty four hours unless there is drainage.  If the epidural site drains for more than 36-48 hours please call the anesthesia department.  QUESTIONS?:  Please feel free to call your physician or the hospital operator if you have any questions, and they will be happy to assist you.     [place] [phone]

## 2011-02-22 NOTE — Pre-Procedure Instructions (Signed)
Pt's Hgb was 9.7 and Hmt 28.1 from pre-op lab work on 02/21/11 at 1112. Dr. Patsey Berthold notified and says they are okay, no further orders given.

## 2011-03-01 ENCOUNTER — Ambulatory Visit (HOSPITAL_COMMUNITY): Payer: Medicare Other | Admitting: Anesthesiology

## 2011-03-01 ENCOUNTER — Encounter (HOSPITAL_COMMUNITY): Payer: Self-pay | Admitting: Ophthalmology

## 2011-03-01 ENCOUNTER — Ambulatory Visit (HOSPITAL_COMMUNITY)
Admission: RE | Admit: 2011-03-01 | Discharge: 2011-03-01 | Disposition: A | Discharge status: Home / Self Care | Payer: Medicare Other | Source: Ambulatory Visit | Attending: Ophthalmology | Admitting: Ophthalmology

## 2011-03-01 ENCOUNTER — Encounter (HOSPITAL_COMMUNITY): Payer: Self-pay | Admitting: Anesthesiology

## 2011-03-01 ENCOUNTER — Encounter (HOSPITAL_COMMUNITY)
Admission: RE | Disposition: A | Discharge status: Home / Self Care | Payer: Self-pay | Source: Ambulatory Visit | Attending: Ophthalmology

## 2011-03-01 ENCOUNTER — Encounter (HOSPITAL_COMMUNITY): Payer: Self-pay | Admitting: *Deleted

## 2011-03-01 DIAGNOSIS — I10 Essential (primary) hypertension: Secondary | ICD-10-CM | POA: Insufficient documentation

## 2011-03-01 DIAGNOSIS — Z01810 Encounter for preprocedural cardiovascular examination: Secondary | ICD-10-CM | POA: Insufficient documentation

## 2011-03-01 DIAGNOSIS — Z01812 Encounter for preprocedural laboratory examination: Secondary | ICD-10-CM | POA: Insufficient documentation

## 2011-03-01 DIAGNOSIS — H2181 Floppy iris syndrome: Secondary | ICD-10-CM | POA: Insufficient documentation

## 2011-03-01 DIAGNOSIS — Z794 Long term (current) use of insulin: Secondary | ICD-10-CM | POA: Insufficient documentation

## 2011-03-01 DIAGNOSIS — H2589 Other age-related cataract: Secondary | ICD-10-CM | POA: Insufficient documentation

## 2011-03-01 DIAGNOSIS — Z79899 Other long term (current) drug therapy: Secondary | ICD-10-CM | POA: Insufficient documentation

## 2011-03-01 DIAGNOSIS — E119 Type 2 diabetes mellitus without complications: Secondary | ICD-10-CM | POA: Insufficient documentation

## 2011-03-01 HISTORY — PX: CATARACT EXTRACTION W/PHACO: SHX586

## 2011-03-01 LAB — GLUCOSE, CAPILLARY: Glucose-Capillary: 68 mg/dL — ABNORMAL LOW (ref 70–99)

## 2011-03-01 SURGERY — PHACOEMULSIFICATION, CATARACT, WITH IOL INSERTION
Anesthesia: Monitor Anesthesia Care | Site: Eye | Laterality: Right | Wound class: Clean

## 2011-03-01 MED ORDER — NEOMYCIN-POLYMYXIN-DEXAMETH 0.1 % OP OINT
TOPICAL_OINTMENT | OPHTHALMIC | Status: DC | PRN
  Administered 2011-03-01: 1 via OPHTHALMIC

## 2011-03-01 MED ORDER — PROVISC 10 MG/ML IO SOLN
INTRAOCULAR | Status: DC | PRN
  Administered 2011-03-01: 8.5 mg via INTRAOCULAR

## 2011-03-01 MED ORDER — EPINEPHRINE HCL 1 MG/ML IJ SOLN
INTRAMUSCULAR | Status: AC
  Filled 2011-03-01: qty 1

## 2011-03-01 MED ORDER — LIDOCAINE HCL (PF) 1 % IJ SOLN
INTRAMUSCULAR | Status: AC
  Filled 2011-03-01: qty 2

## 2011-03-01 MED ORDER — POVIDONE-IODINE 5 % OP SOLN
OPHTHALMIC | Status: DC | PRN
  Administered 2011-03-01: 1 via OPHTHALMIC

## 2011-03-01 MED ORDER — LACTATED RINGERS IV SOLN
INTRAVENOUS | Status: DC
  Administered 2011-03-01: 1000 mL via INTRAVENOUS

## 2011-03-01 MED ORDER — TETRACAINE HCL 0.5 % OP SOLN
OPHTHALMIC | Status: AC
  Administered 2011-03-01: 1 [drp] via OPHTHALMIC
  Filled 2011-03-01: qty 2

## 2011-03-01 MED ORDER — NEOMYCIN-POLYMYXIN-DEXAMETH 3.5-10000-0.1 OP OINT
TOPICAL_OINTMENT | OPHTHALMIC | Status: AC
  Filled 2011-03-01: qty 3.5

## 2011-03-01 MED ORDER — MIDAZOLAM HCL 2 MG/2ML IJ SOLN
1.0000 mg | INTRAMUSCULAR | Status: DC | PRN
  Administered 2011-03-01: 2 mg via INTRAVENOUS

## 2011-03-01 MED ORDER — DEXTROSE 50 % IV SOLN
INTRAVENOUS | Status: AC
  Administered 2011-03-01: 25 mL via INTRAVENOUS
  Filled 2011-03-01: qty 50

## 2011-03-01 MED ORDER — LACTATED RINGERS IV SOLN
INTRAVENOUS | Status: DC | PRN
  Administered 2011-03-01: 09:00:00 via INTRAVENOUS

## 2011-03-01 MED ORDER — LIDOCAINE HCL (PF) 1 % IJ SOLN
INTRAOCULAR | Status: DC | PRN
  Administered 2011-03-01: 09:00:00 via OPHTHALMIC

## 2011-03-01 MED ORDER — PHENYLEPHRINE HCL 2.5 % OP SOLN
OPHTHALMIC | Status: AC
  Administered 2011-03-01: 1 [drp] via OPHTHALMIC
  Filled 2011-03-01: qty 2

## 2011-03-01 MED ORDER — PHENYLEPHRINE HCL 2.5 % OP SOLN
1.0000 [drp] | OPHTHALMIC | Status: AC
  Administered 2011-03-01 (×3): 1 [drp] via OPHTHALMIC

## 2011-03-01 MED ORDER — EPINEPHRINE HCL 1 MG/ML IJ SOLN
INTRAOCULAR | Status: DC | PRN
  Administered 2011-03-01: 09:00:00

## 2011-03-01 MED ORDER — LIDOCAINE HCL 3.5 % OP GEL
OPHTHALMIC | Status: AC
  Administered 2011-03-01: 1 via OPHTHALMIC
  Filled 2011-03-01: qty 5

## 2011-03-01 MED ORDER — CYCLOPENTOLATE-PHENYLEPHRINE 0.2-1 % OP SOLN
OPHTHALMIC | Status: AC
  Administered 2011-03-01: 1 [drp] via OPHTHALMIC
  Filled 2011-03-01: qty 2

## 2011-03-01 MED ORDER — BSS IO SOLN
INTRAOCULAR | Status: DC | PRN
  Administered 2011-03-01: 15 mL via INTRAOCULAR

## 2011-03-01 MED ORDER — MIDAZOLAM HCL 2 MG/2ML IJ SOLN
INTRAMUSCULAR | Status: AC
  Administered 2011-03-01: 2 mg via INTRAVENOUS
  Filled 2011-03-01: qty 2

## 2011-03-01 MED ORDER — DEXTROSE 50 % IV SOLN: 12.5000 g | Freq: Once | INTRAVENOUS | Status: DC

## 2011-03-01 MED ORDER — TETRACAINE HCL 0.5 % OP SOLN
1.0000 [drp] | OPHTHALMIC | Status: AC
  Administered 2011-03-01 (×3): 1 [drp] via OPHTHALMIC

## 2011-03-01 MED ORDER — CYCLOPENTOLATE-PHENYLEPHRINE 0.2-1 % OP SOLN
1.0000 [drp] | OPHTHALMIC | Status: AC
Start: 2011-03-01 — End: 2011-03-01
  Administered 2011-03-01 (×3): 1 [drp] via OPHTHALMIC

## 2011-03-01 MED ORDER — MIDAZOLAM HCL 2 MG/2ML IJ SOLN: 1.0000 mg | INTRAMUSCULAR | Status: DC | PRN

## 2011-03-01 MED ORDER — LIDOCAINE 3.5 % OP GEL OPTIME - NO CHARGE
OPHTHALMIC | Status: DC | PRN
  Administered 2011-03-01: 1 [drp] via OPHTHALMIC

## 2011-03-01 MED ORDER — LACTATED RINGERS IV SOLN: INTRAVENOUS | Status: DC

## 2011-03-01 MED ORDER — LIDOCAINE HCL 3.5 % OP GEL
1.0000 "application " | Freq: Once | OPHTHALMIC | Status: AC
  Administered 2011-03-01: 1 via OPHTHALMIC

## 2011-03-01 SURGICAL SUPPLY — 32 items

## 2011-03-01 NOTE — Transfer of Care (Signed)
Immediate Anesthesia Transfer of Care Note  Patient: Vanessa Webb  Procedure(s) Performed:  CATARACT EXTRACTION PHACO AND INTRAOCULAR LENS PLACEMENT (IOC) - CDE: 15.83  Patient Location: Short Stay  Anesthesia Type: MAC  Level of Consciousness: awake, alert , oriented and patient cooperative  Airway & Oxygen Therapy: Patient Spontanous Breathing  Post-op Assessment: Report given to PACU RN, Post -op Vital signs reviewed and stable and Patient moving all extremities  Post vital signs: Reviewed and stable  Complications: No apparent anesthesia complications

## 2011-03-01 NOTE — Brief Op Note (Signed)
Pre-Op Dx: Cataract OD Post-Op Dx: Cataract OD Surgeon: Hikari Tripp Anesthesia: Topical with MAC Implant: Lenstec, Model Softec HD Blood Loss: None Specimen: None Complications: None 

## 2011-03-01 NOTE — Preoperative (Signed)
Beta Blockers   Reason not to administer Beta Blockers:Hold beta blocker due to other

## 2011-03-01 NOTE — Anesthesia Postprocedure Evaluation (Signed)
  Anesthesia Post-op Note  Patient: Vanessa Webb  Procedure(s) Performed:  CATARACT EXTRACTION PHACO AND INTRAOCULAR LENS PLACEMENT (IOC) - CDE: 15.83  Patient Location: Short Stay  Anesthesia Type: MAC  Level of Consciousness: awake, alert , oriented and patient cooperative  Airway and Oxygen Therapy: Patient Spontanous Breathing  Post-op Pain: none  Post-op Assessment: Post-op Vital signs reviewed, Patient's Cardiovascular Status Stable, Respiratory Function Stable, Patent Airway, No signs of Nausea or vomiting and Adequate PO intake  Post-op Vital Signs: Reviewed and stable  Complications: No apparent anesthesia complications

## 2011-03-01 NOTE — H&P (Signed)
I have reviewed the H&P, the patient was re-examined, and I have identified no interval changes in medical condition and plan of care since the history and physical of record  

## 2011-03-01 NOTE — Anesthesia Preprocedure Evaluation (Addendum)
Anesthesia Evaluation  Patient identified by MRN, date of birth, ID band Patient awake    Reviewed: Allergy & Precautions, H&P , NPO status , Patient's Chart, lab work & pertinent test results, reviewed documented beta blocker date and time   Airway Mallampati: III      Dental  (+) Teeth Intact   Pulmonary neg pulmonary ROS,    Pulmonary exam normal       Cardiovascular hypertension, Pt. on medications - angina+ CAD and + Past MI Regular Normal    Neuro/Psych Anxiety    GI/Hepatic PUD, GERD-  ,  Endo/Other  Diabetes mellitus-, Well Controlled, Type 2, Insulin Dependent  Renal/GU      Musculoskeletal   Abdominal   Peds  Hematology   Anesthesia Other Findings   Reproductive/Obstetrics                         Anesthesia Physical Anesthesia Plan  ASA: III  Anesthesia Plan: MAC   Post-op Pain Management:    Induction: Intravenous  Airway Management Planned: Nasal Cannula  Additional Equipment:   Intra-op Plan:   Post-operative Plan:   Informed Consent: I have reviewed the patients History and Physical, chart, labs and discussed the procedure including the risks, benefits and alternatives for the proposed anesthesia with the patient or authorized representative who has indicated his/her understanding and acceptance.     Plan Discussed with:   Anesthesia Plan Comments:         Anesthesia Quick Evaluation

## 2011-03-01 NOTE — Op Note (Signed)
NAMEMANEH, BA                 ACCOUNT NO.:  [id number]  MEDICAL RECORD NO.:  [id number]  LOCATION:  [facility]                          FACILITY:  [facility]  PHYSICIAN:  Richardo Hanks, MD       DATE OF BIRTH:  [birth year]  DATE OF PROCEDURE:  03/01/2011 DATE OF DISCHARGE:  03/01/2011                              OPERATIVE REPORT   PREOPERATIVE DIAGNOSIS:  Combined cataract, right eye, diagnosis code 366.19.  POSTOPERATIVE DIAGNOSES: 1. Combined cataract, right eye, diagnosis code 366.19. 2. 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  No surgical specimens.  Prosthetic device used is a Lenstec posterior chamber lens, model Softec HD, power of 22.0, serial number is [id number].          ______________________________ Richardo Hanks, MD     KEH/MEDQ  D:  03/01/2011  T:  03/01/2011  Job:  [id number]

## 2011-03-05 ENCOUNTER — Encounter (HOSPITAL_COMMUNITY): Payer: Self-pay | Admitting: Ophthalmology

## 2011-10-21 DIAGNOSIS — E1129 Type 2 diabetes mellitus with other diabetic kidney complication: Secondary | ICD-10-CM | POA: Insufficient documentation

## 2012-01-18 ENCOUNTER — Ambulatory Visit (INDEPENDENT_AMBULATORY_CARE_PROVIDER_SITE_OTHER): Payer: Medicare Other | Admitting: Urology

## 2012-01-18 DIAGNOSIS — N281 Cyst of kidney, acquired: Secondary | ICD-10-CM

## 2012-01-18 DIAGNOSIS — N189 Chronic kidney disease, unspecified: Secondary | ICD-10-CM

## 2012-05-19 ENCOUNTER — Other Ambulatory Visit: Payer: Self-pay | Admitting: Urology

## 2012-05-19 DIAGNOSIS — N281 Cyst of kidney, acquired: Secondary | ICD-10-CM

## 2012-05-21 ENCOUNTER — Ambulatory Visit (HOSPITAL_COMMUNITY)
Admission: RE | Admit: 2012-05-21 | Discharge: 2012-05-21 | Disposition: A | Discharge status: Home / Self Care | Payer: Medicare Other | Source: Ambulatory Visit | Attending: Urology | Admitting: Urology

## 2012-05-21 DIAGNOSIS — Q619 Cystic kidney disease, unspecified: Secondary | ICD-10-CM | POA: Insufficient documentation

## 2012-05-21 DIAGNOSIS — N281 Cyst of kidney, acquired: Secondary | ICD-10-CM

## 2012-05-22 ENCOUNTER — Ambulatory Visit: Payer: Medicare Other | Admitting: Internal Medicine

## 2012-05-23 ENCOUNTER — Ambulatory Visit (INDEPENDENT_AMBULATORY_CARE_PROVIDER_SITE_OTHER): Payer: Medicare Other | Admitting: Urology

## 2012-05-23 DIAGNOSIS — N3020 Other chronic cystitis without hematuria: Secondary | ICD-10-CM

## 2012-05-23 DIAGNOSIS — R3129 Other microscopic hematuria: Secondary | ICD-10-CM

## 2012-05-23 DIAGNOSIS — N329 Bladder disorder, unspecified: Secondary | ICD-10-CM

## 2012-07-25 ENCOUNTER — Other Ambulatory Visit: Payer: Self-pay | Admitting: *Deleted

## 2012-07-25 DIAGNOSIS — T82598A Other mechanical complication of other cardiac and vascular devices and implants, initial encounter: Secondary | ICD-10-CM

## 2012-08-25 ENCOUNTER — Encounter: Payer: Self-pay | Admitting: Vascular Surgery

## 2012-08-26 ENCOUNTER — Ambulatory Visit (INDEPENDENT_AMBULATORY_CARE_PROVIDER_SITE_OTHER): Payer: Medicare Other | Admitting: Vascular Surgery

## 2012-08-26 ENCOUNTER — Other Ambulatory Visit: Payer: Self-pay

## 2012-08-26 ENCOUNTER — Encounter (INDEPENDENT_AMBULATORY_CARE_PROVIDER_SITE_OTHER): Payer: Medicare Other | Admitting: *Deleted

## 2012-08-26 ENCOUNTER — Encounter: Payer: Self-pay | Admitting: Vascular Surgery

## 2012-08-26 VITALS — BP 112/57 | HR 92 | Resp 16 | Ht 64.0 in | Wt 147.0 lb

## 2012-08-26 DIAGNOSIS — T82598A Other mechanical complication of other cardiac and vascular devices and implants, initial encounter: Secondary | ICD-10-CM

## 2012-08-26 DIAGNOSIS — N186 End stage renal disease: Secondary | ICD-10-CM

## 2012-08-26 DIAGNOSIS — N179 Acute kidney failure, unspecified: Secondary | ICD-10-CM | POA: Insufficient documentation

## 2012-08-26 NOTE — Progress Notes (Signed)
Subjective:     Patient ID: Vanessa Webb, female   DOB: March 18, 1945, 67 y.o.   MRN: [id number]  HPI this 67 year old female with diabetes mellitus-type one was referred for vascular access by Dr. Carson Myrtle. She is right-handed. She has never had hemodialysis in the past and is currently not on hemodialysis.  Past Medical History  Diagnosis Date  . Myocardial infarction 2010  . Hypertension   . Diabetes mellitus   . Anxiety   . GERD (gastroesophageal reflux disease)   . UTI (lower urinary tract infection)     History  Substance Use Topics  . Smoking status: Never Smoker   . Smokeless tobacco: Not on file  . Alcohol Use: No    Family History  Problem Relation Age of Onset  . Anesthesia problems Neg Hx   . Hypotension Neg Hx   . Malignant hyperthermia Neg Hx   . Pseudochol deficiency Neg Hx     No Known Allergies  Current outpatient prescriptions:aspirin EC 81 MG tablet, Take 81 mg by mouth daily.  , Disp: , Rfl: ;  carvedilol (COREG) 3.125 MG tablet, Take 3.125 mg by mouth 2 (two) times daily with a meal.  , Disp: , Rfl: ;  ciprofloxacin (CIPRO) 250 MG tablet, Take 250 mg by mouth 2 (two) times daily.  , Disp: , Rfl: ;  fludrocortisone (FLORINEF) 0.1 MG tablet, Take 0.1 mg by mouth daily.  , Disp: , Rfl:  insulin aspart protamine-insulin aspart (NOVOLOG 70/30) (70-30) 100 UNIT/ML injection, Inject 36-50 Units into the skin 2 (two) times daily with a meal. Inject 50 units at breakfast and 36 units with supper , Disp: , Rfl: ;  insulin glargine (LANTUS) 100 UNIT/ML injection, Inject 55 Units into the skin at bedtime. , Disp: , Rfl:  metoCLOPramide (REGLAN) 10 MG tablet, Take 10 mg by mouth 4 (four) times daily -  with meals and at bedtime.  , Disp: , Rfl: ;  nitrofurantoin, macrocrystal-monohydrate, (MACROBID) 100 MG capsule, Take 100 mg by mouth 2 (two) times daily with a meal.  , Disp: , Rfl: ;  omeprazole (PRILOSEC) 20 MG capsule, Take 20 mg by mouth 2 (two) times daily.  , Disp: ,  Rfl:  potassium chloride (K-DUR,KLOR-CON) 10 MEQ tablet, Take 10 mEq by mouth 2 (two) times daily.  , Disp: , Rfl: ;  simvastatin (ZOCOR) 40 MG tablet, Take 40 mg by mouth at bedtime.  , Disp: , Rfl: ;  spironolactone (ALDACTONE) 25 MG tablet, Take 25 mg by mouth 2 (two) times daily.  , Disp: , Rfl:   BP 112/57  Pulse 92  Resp 16  Ht 5\' 4"  (1.626 m)  Wt 147 lb (66.679 kg)  BMI 25.22 kg/m2  Body mass index is 25.22 kg/(m^2).           Review of Systems denies chest pain, dyspnea on exertion, PND, orthopnea, claudication. Other systems negative and complete review of systems    Objective:   Physical Exam blood pressure 112/57 heart rate 92 respirations 16 Gen.-alert and oriented x3 in no apparent distress HEENT normal for age Lungs no rhonchi or wheezing Cardiovascular regular rhythm no murmurs carotid pulses 3+ palpable no bruits audible Abdomen soft nontender no palpable masses Musculoskeletal free of  major deformities Skin clear -no rashes Neurologic normal Lower extremities 3+ femoral and dorsalis pedis pulses palpable bilaterally with no edema  I have ordered bilateral vein mapping which are reviewed and interpreted and also performed a independent bedside SonoSite ultrasound  exam. Her veins are quite small and in my opinion the only option is a right basilic vein transposition      Assessment:     End-stage renal disease-not on hemodialysis- Needs vascular access    Plan:     Plan right basilic vein transposition on Thursday, July 17 at [place] this at length with patient and her husband and the fact that her veins are small and this may not mature into usable fistula.

## 2012-09-01 ENCOUNTER — Encounter (HOSPITAL_COMMUNITY): Payer: Self-pay | Admitting: Pharmacy Technician

## 2012-09-05 ENCOUNTER — Encounter (HOSPITAL_COMMUNITY): Payer: Self-pay

## 2012-09-05 ENCOUNTER — Ambulatory Visit (HOSPITAL_COMMUNITY)
Admission: RE | Admit: 2012-09-05 | Discharge: 2012-09-05 | Disposition: A | Discharge status: Home / Self Care | Payer: Medicare Other | Source: Ambulatory Visit | Attending: Anesthesiology | Admitting: Anesthesiology

## 2012-09-05 ENCOUNTER — Encounter (HOSPITAL_COMMUNITY)
Admission: RE | Admit: 2012-09-05 | Discharge: 2012-09-05 | Disposition: A | Discharge status: Home / Self Care | Payer: Medicare Other | Source: Ambulatory Visit | Attending: Vascular Surgery | Admitting: Vascular Surgery

## 2012-09-05 DIAGNOSIS — I10 Essential (primary) hypertension: Secondary | ICD-10-CM | POA: Insufficient documentation

## 2012-09-05 DIAGNOSIS — Z01810 Encounter for preprocedural cardiovascular examination: Secondary | ICD-10-CM | POA: Insufficient documentation

## 2012-09-05 DIAGNOSIS — Z01812 Encounter for preprocedural laboratory examination: Secondary | ICD-10-CM | POA: Insufficient documentation

## 2012-09-05 DIAGNOSIS — Z01818 Encounter for other preprocedural examination: Secondary | ICD-10-CM | POA: Insufficient documentation

## 2012-09-05 HISTORY — DX: Personal history of other medical treatment: Z92.89

## 2012-09-05 HISTORY — DX: Anemia, unspecified: D64.9

## 2012-09-05 HISTORY — DX: Acute pancreatitis without necrosis or infection, unspecified: K85.90

## 2012-09-05 HISTORY — DX: Gastrointestinal hemorrhage, unspecified: K92.2

## 2012-09-05 HISTORY — DX: Polyneuropathy, unspecified: G62.9

## 2012-09-05 HISTORY — DX: Major depressive disorder, single episode, unspecified: F32.9

## 2012-09-05 HISTORY — DX: Personal history of urinary (tract) infections: Z87.440

## 2012-09-05 HISTORY — DX: Depression, unspecified: F32.A

## 2012-09-05 HISTORY — DX: Hyperlipidemia, unspecified: E78.5

## 2012-09-05 NOTE — Progress Notes (Signed)
Pt doesn't have a cardiologist  Medical Md is Dr.David Tapper  Echo and stress test to be requested from [facility] cath report in epic from 2010  Denies EKG or CXR in past

## 2012-09-05 NOTE — Pre-Procedure Instructions (Signed)
Vanessa Webb  09/05/2012   Your procedure is scheduled on:  Thurs, July 17 @ 7:30 AM  Report to [place] at 5:30 AM.  Call this number if you have problems the morning of surgery: [phone]   Remember:   Do not eat food or drink liquids after midnight.   Take these medicines the morning of surgery with A SIP OF WATER: Carvedilol(Coreg) and Omeprazole(Prilosec)                            No Aspirin,Goody's,BC's,Aleve,Ibuprofen,Fish Oil,or any Herbal Medications   Do not wear jewelry, make-up or nail polish.  Do not wear lotions, powders, or perfumes. You may wear deodorant.  Do not shave 48 hours prior to surgery.   Do not bring valuables to the hospital.  [facility] is not responsible                   for any belongings or valuables.  Contacts, dentures or bridgework may not be worn into surgery.  Leave suitcase in the car. After surgery it may be brought to your room.  For patients admitted to the hospital, checkout time is 11:00 AM the day of  discharge.   Patients discharged the day of surgery will not be allowed to drive  home.    Special Instructions: Shower using CHG 2 nights before surgery and the night before surgery.  If you shower the day of surgery use CHG.  Use special wash - you have one bottle of CHG for all showers.  You should use approximately 1/3 of the bottle for each shower.   Please read over the following fact sheets that you were given: Pain Booklet, Coughing and Deep Breathing and Surgical Site Infection Prevention

## 2012-09-08 ENCOUNTER — Other Ambulatory Visit: Payer: Self-pay

## 2012-09-10 MED ORDER — DEXTROSE 5 % IV SOLN
1.5000 g | INTRAVENOUS | Status: AC
  Administered 2012-09-11: 1.5 g via INTRAVENOUS
  Filled 2012-09-10: qty 1.5

## 2012-09-11 ENCOUNTER — Ambulatory Visit (HOSPITAL_COMMUNITY): Payer: Medicare Other | Admitting: Anesthesiology

## 2012-09-11 ENCOUNTER — Other Ambulatory Visit: Payer: Self-pay | Admitting: *Deleted

## 2012-09-11 ENCOUNTER — Ambulatory Visit (HOSPITAL_COMMUNITY)
Admission: RE | Admit: 2012-09-11 | Discharge: 2012-09-11 | Disposition: A | Discharge status: Home / Self Care | Payer: Medicare Other | Source: Ambulatory Visit | Attending: Vascular Surgery | Admitting: Vascular Surgery

## 2012-09-11 ENCOUNTER — Encounter (HOSPITAL_COMMUNITY)
Admission: RE | Disposition: A | Discharge status: Home / Self Care | Payer: Self-pay | Source: Ambulatory Visit | Attending: Vascular Surgery

## 2012-09-11 ENCOUNTER — Telehealth: Payer: Self-pay | Admitting: Vascular Surgery

## 2012-09-11 ENCOUNTER — Encounter (HOSPITAL_COMMUNITY): Payer: Self-pay | Admitting: Anesthesiology

## 2012-09-11 ENCOUNTER — Encounter (HOSPITAL_COMMUNITY): Payer: Self-pay | Admitting: *Deleted

## 2012-09-11 DIAGNOSIS — K219 Gastro-esophageal reflux disease without esophagitis: Secondary | ICD-10-CM | POA: Insufficient documentation

## 2012-09-11 DIAGNOSIS — I252 Old myocardial infarction: Secondary | ICD-10-CM | POA: Insufficient documentation

## 2012-09-11 DIAGNOSIS — N186 End stage renal disease: Secondary | ICD-10-CM

## 2012-09-11 DIAGNOSIS — Z4931 Encounter for adequacy testing for hemodialysis: Secondary | ICD-10-CM

## 2012-09-11 DIAGNOSIS — F411 Generalized anxiety disorder: Secondary | ICD-10-CM | POA: Insufficient documentation

## 2012-09-11 DIAGNOSIS — I120 Hypertensive chronic kidney disease with stage 5 chronic kidney disease or end stage renal disease: Secondary | ICD-10-CM | POA: Insufficient documentation

## 2012-09-11 DIAGNOSIS — E109 Type 1 diabetes mellitus without complications: Secondary | ICD-10-CM | POA: Insufficient documentation

## 2012-09-11 DIAGNOSIS — Z7982 Long term (current) use of aspirin: Secondary | ICD-10-CM | POA: Insufficient documentation

## 2012-09-11 DIAGNOSIS — Z87440 Personal history of urinary (tract) infections: Secondary | ICD-10-CM | POA: Insufficient documentation

## 2012-09-11 DIAGNOSIS — Z79899 Other long term (current) drug therapy: Secondary | ICD-10-CM | POA: Insufficient documentation

## 2012-09-11 HISTORY — PX: BASCILIC VEIN TRANSPOSITION: SHX5742

## 2012-09-11 LAB — POCT I-STAT 4, (NA,K, GLUC, HGB,HCT): Glucose, Bld: 99 mg/dL (ref 70–99)

## 2012-09-11 LAB — GLUCOSE, CAPILLARY: Glucose-Capillary: 97 mg/dL (ref 70–99)

## 2012-09-11 SURGERY — TRANSPOSITION, VEIN, BASILIC
Anesthesia: General | Site: Arm Upper | Laterality: Right | Wound class: Clean

## 2012-09-11 MED ORDER — EPHEDRINE SULFATE 50 MG/ML IJ SOLN
INTRAMUSCULAR | Status: DC | PRN
  Administered 2012-09-11 (×2): 10 mg via INTRAVENOUS

## 2012-09-11 MED ORDER — OXYCODONE HCL 5 MG PO TABS: 5.0000 mg | ORAL_TABLET | Freq: Four times a day (QID) | ORAL | Status: DC | PRN

## 2012-09-11 MED ORDER — ONDANSETRON HCL 4 MG/2ML IJ SOLN
INTRAMUSCULAR | Status: DC | PRN
  Administered 2012-09-11: 4 mg via INTRAVENOUS

## 2012-09-11 MED ORDER — MIDAZOLAM HCL 5 MG/5ML IJ SOLN
INTRAMUSCULAR | Status: DC | PRN
  Administered 2012-09-11: 0.5 mg via INTRAVENOUS

## 2012-09-11 MED ORDER — HYDROMORPHONE HCL PF 1 MG/ML IJ SOLN: 0.2500 mg | INTRAMUSCULAR | Status: DC | PRN

## 2012-09-11 MED ORDER — OXYCODONE HCL 5 MG/5ML PO SOLN: 5.0000 mg | Freq: Once | ORAL | Status: DC | PRN

## 2012-09-11 MED ORDER — PROPOFOL 10 MG/ML IV BOLUS
INTRAVENOUS | Status: DC | PRN
  Administered 2012-09-11: 20 mg via INTRAVENOUS
  Administered 2012-09-11: 110 mg via INTRAVENOUS
  Administered 2012-09-11: 20 mg via INTRAVENOUS
  Administered 2012-09-11: 10 mg via INTRAVENOUS

## 2012-09-11 MED ORDER — 0.9 % SODIUM CHLORIDE (POUR BTL) OPTIME
TOPICAL | Status: DC | PRN
  Administered 2012-09-11: 1000 mL

## 2012-09-11 MED ORDER — PROMETHAZINE HCL 25 MG/ML IJ SOLN: 6.2500 mg | INTRAMUSCULAR | Status: DC | PRN

## 2012-09-11 MED ORDER — FENTANYL CITRATE 0.05 MG/ML IJ SOLN
INTRAMUSCULAR | Status: DC | PRN
  Administered 2012-09-11: 25 ug via INTRAVENOUS
  Administered 2012-09-11: 75 ug via INTRAVENOUS
  Administered 2012-09-11: 25 ug via INTRAVENOUS

## 2012-09-11 MED ORDER — LIDOCAINE HCL (CARDIAC) 20 MG/ML IV SOLN
INTRAVENOUS | Status: DC | PRN
  Administered 2012-09-11: 80 mg via INTRAVENOUS

## 2012-09-11 MED ORDER — SODIUM CHLORIDE 0.9 % IR SOLN
Status: DC | PRN
  Administered 2012-09-11: 08:00:00

## 2012-09-11 MED ORDER — SODIUM CHLORIDE 0.9 % IV SOLN
INTRAVENOUS | Status: DC | PRN
  Administered 2012-09-11 (×2): via INTRAVENOUS

## 2012-09-11 MED ORDER — SODIUM CHLORIDE 0.9 % IV SOLN: INTRAVENOUS | Status: DC

## 2012-09-11 MED ORDER — OXYCODONE HCL 5 MG PO TABS: 5.0000 mg | ORAL_TABLET | Freq: Once | ORAL | Status: DC | PRN

## 2012-09-11 SURGICAL SUPPLY — 37 items
ADH SKN CLS APL DERMABOND .7 (GAUZE/BANDAGES/DRESSINGS) ×2
CANISTER SUCTION 2500CC (MISCELLANEOUS) ×2 IMPLANT
CLIP TI MEDIUM 24 (CLIP) ×2 IMPLANT
CLIP TI WIDE RED SMALL 24 (CLIP) ×2 IMPLANT
CLOTH BEACON ORANGE TIMEOUT ST (SAFETY) ×2 IMPLANT
COVER PROBE W GEL 5X96 (DRAPES) IMPLANT
COVER SURGICAL LIGHT HANDLE (MISCELLANEOUS) ×2 IMPLANT
DERMABOND ADVANCED (GAUZE/BANDAGES/DRESSINGS) ×2
DERMABOND ADVANCED .7 DNX12 (GAUZE/BANDAGES/DRESSINGS) ×2 IMPLANT
ELECT REM PT RETURN 9FT ADLT (ELECTROSURGICAL) ×2
ELECTRODE REM PT RTRN 9FT ADLT (ELECTROSURGICAL) ×1 IMPLANT
GEL ULTRASOUND 20GR AQUASONIC (MISCELLANEOUS) ×2 IMPLANT
GLOVE BIO SURGEON STRL SZ 6.5 (GLOVE) ×4 IMPLANT
GLOVE BIOGEL PI IND STRL 6.5 (GLOVE) ×3 IMPLANT
GLOVE BIOGEL PI IND STRL 7.0 (GLOVE) ×1 IMPLANT
GLOVE BIOGEL PI INDICATOR 6.5 (GLOVE) ×3
GLOVE BIOGEL PI INDICATOR 7.0 (GLOVE) ×1
GLOVE ECLIPSE 6.5 STRL STRAW (GLOVE) ×2 IMPLANT
GLOVE SS BIOGEL STRL SZ 7 (GLOVE) ×1 IMPLANT
GLOVE SUPERSENSE BIOGEL SZ 7 (GLOVE) ×1
GLOVE SURG SS PI 7.5 STRL IVOR (GLOVE) ×4 IMPLANT
GOWN STRL NON-REIN LRG LVL3 (GOWN DISPOSABLE) ×10 IMPLANT
KIT BASIN OR (CUSTOM PROCEDURE TRAY) ×2 IMPLANT
KIT ROOM TURNOVER OR (KITS) ×2 IMPLANT
NS IRRIG 1000ML POUR BTL (IV SOLUTION) ×2 IMPLANT
PACK CV ACCESS (CUSTOM PROCEDURE TRAY) ×2 IMPLANT
PAD ARMBOARD 7.5X6 YLW CONV (MISCELLANEOUS) ×4 IMPLANT
SPONGE GAUZE 4X4 12PLY (GAUZE/BANDAGES/DRESSINGS) ×2 IMPLANT
SUT PROLENE 6 0 BV (SUTURE) ×4 IMPLANT
SUT SILK 2 0 SH (SUTURE) ×2 IMPLANT
SUT VIC AB 3-0 SH 27 (SUTURE) ×6
SUT VIC AB 3-0 SH 27X BRD (SUTURE) ×3 IMPLANT
SUT VICRYL 4-0 PS2 18IN ABS (SUTURE) ×2 IMPLANT
TOWEL OR 17X24 6PK STRL BLUE (TOWEL DISPOSABLE) ×2 IMPLANT
TOWEL OR 17X26 10 PK STRL BLUE (TOWEL DISPOSABLE) ×2 IMPLANT
UNDERPAD 30X30 INCONTINENT (UNDERPADS AND DIAPERS) ×2 IMPLANT
WATER STERILE IRR 1000ML POUR (IV SOLUTION) ×2 IMPLANT

## 2012-09-11 NOTE — Anesthesia Postprocedure Evaluation (Signed)
Anesthesia Post Note  Patient: Vanessa Webb  Procedure(s) Performed: Procedure(s) (LRB): BASCILIC VEIN TRANSPOSITION- RIGHT ARM (Right)  Anesthesia type: general  Patient location: PACU  Post pain: Pain level controlled  Post assessment: Patient's Cardiovascular Status Stable  Last Vitals:  Filed Vitals:   09/11/12 1000  BP: 155/60  Pulse: 79  Temp:   Resp: 15    Post vital signs: Reviewed and stable  Level of consciousness: sedated  Complications: No apparent anesthesia complications

## 2012-09-11 NOTE — Interval H&P Note (Signed)
History and Physical Interval Note:  09/11/2012 7:25 AM  Vanessa Webb  has presented today for surgery, with the diagnosis of End Stage Renal Disease  The various methods of treatment have been discussed with the patient and family. After consideration of risks, benefits and other options for treatment, the patient has consented to  Procedure(s): [place] (Right) as a surgical intervention .  The patient's history has been reviewed, patient examined, no change in status, stable for surgery.  I have reviewed the patient's chart and labs.  Questions were answered to the patient's satisfaction.     [name]

## 2012-09-11 NOTE — H&P (View-Only) (Signed)
Subjective:     Patient ID: Vanessa Webb, female   DOB: [birth year], 67 y.o.   MRN: [id number]  HPI this 67 year old female with diabetes mellitus-type one was referred for vascular access by Dr. Carson Myrtle. She is right-handed. She has never had hemodialysis in the past and is currently not on hemodialysis.  Past Medical History  Diagnosis Date  . Myocardial infarction 2010  . Hypertension   . Diabetes mellitus   . Anxiety   . GERD (gastroesophageal reflux disease)   . UTI (lower urinary tract infection)     History  Substance Use Topics  . Smoking status: Never Smoker   . Smokeless tobacco: Not on file  . Alcohol Use: No    Family History  Problem Relation Age of Onset  . Anesthesia problems Neg Hx   . Hypotension Neg Hx   . Malignant hyperthermia Neg Hx   . Pseudochol deficiency Neg Hx     No Known Allergies  Current outpatient prescriptions:aspirin EC 81 MG tablet, Take 81 mg by mouth daily.  , Disp: , Rfl: ;  carvedilol (COREG) 3.125 MG tablet, Take 3.125 mg by mouth 2 (two) times daily with a meal.  , Disp: , Rfl: ;  ciprofloxacin (CIPRO) 250 MG tablet, Take 250 mg by mouth 2 (two) times daily.  , Disp: , Rfl: ;  fludrocortisone (FLORINEF) 0.1 MG tablet, Take 0.1 mg by mouth daily.  , Disp: , Rfl:  insulin aspart protamine-insulin aspart (NOVOLOG 70/30) (70-30) 100 UNIT/ML injection, Inject 36-50 Units into the skin 2 (two) times daily with a meal. Inject 50 units at breakfast and 36 units with supper , Disp: , Rfl: ;  insulin glargine (LANTUS) 100 UNIT/ML injection, Inject 55 Units into the skin at bedtime. , Disp: , Rfl:  metoCLOPramide (REGLAN) 10 MG tablet, Take 10 mg by mouth 4 (four) times daily -  with meals and at bedtime.  , Disp: , Rfl: ;  nitrofurantoin, macrocrystal-monohydrate, (MACROBID) 100 MG capsule, Take 100 mg by mouth 2 (two) times daily with a meal.  , Disp: , Rfl: ;  omeprazole (PRILOSEC) 20 MG capsule, Take 20 mg by mouth 2 (two) times daily.  , Disp: ,  Rfl:  potassium chloride (K-DUR,KLOR-CON) 10 MEQ tablet, Take 10 mEq by mouth 2 (two) times daily.  , Disp: , Rfl: ;  simvastatin (ZOCOR) 40 MG tablet, Take 40 mg by mouth at bedtime.  , Disp: , Rfl: ;  spironolactone (ALDACTONE) 25 MG tablet, Take 25 mg by mouth 2 (two) times daily.  , Disp: , Rfl:   BP 112/57  Pulse 92  Resp 16  Ht 5\' 4"  (1.626 m)  Wt 147 lb (66.679 kg)  BMI 25.22 kg/m2  Body mass index is 25.22 kg/(m^2).           Review of Systems denies chest pain, dyspnea on exertion, PND, orthopnea, claudication. Other systems negative and complete review of systems    Objective:   Physical Exam blood pressure 112/57 heart rate 92 respirations 16 Gen.-alert and oriented x3 in no apparent distress HEENT normal for age Lungs no rhonchi or wheezing Cardiovascular regular rhythm no murmurs carotid pulses 3+ palpable no bruits audible Abdomen soft nontender no palpable masses Musculoskeletal free of  major deformities Skin clear -no rashes Neurologic normal Lower extremities 3+ femoral and dorsalis pedis pulses palpable bilaterally with no edema  I have ordered bilateral vein mapping which are reviewed and interpreted and also performed a independent bedside SonoSite ultrasound  exam. Her veins are quite small and in my opinion the only option is a right basilic vein transposition      Assessment:     End-stage renal disease-not on hemodialysis- Needs vascular access    Plan:     Plan right basilic vein transposition on Thursday, July 17 at [place] this at length with patient and her husband and the fact that her veins are small and this may not mature into usable fistula.

## 2012-09-11 NOTE — Preoperative (Signed)
Beta Blockers   Reason not to administer Beta Blockers:Coreg usually in the a.m. Pt skipped today; to evaluate need intraop.

## 2012-09-11 NOTE — Anesthesia Preprocedure Evaluation (Addendum)
Anesthesia Evaluation  Patient identified by MRN, date of birth, ID band Patient awake    Reviewed: Allergy & Precautions, H&P , NPO status , Patient's Chart, lab work & pertinent test results, reviewed documented beta blocker date and time   History of Anesthesia Complications Negative for: history of anesthetic complications  Airway Mallampati: III TM Distance: >3 FB Neck ROM: Full    Dental  (+) Teeth Intact and Dental Advisory Given   Pulmonary neg pulmonary ROS,    Pulmonary exam normal       Cardiovascular hypertension, Pt. on home beta blockers + Past MI     Neuro/Psych PSYCHIATRIC DISORDERS Depression negative neurological ROS     GI/Hepatic GERD-  Controlled and Medicated,  Endo/Other  diabetes, Well Controlled, Type 2, Insulin Dependent  Renal/GU ESRF and DialysisRenal disease     Musculoskeletal   Abdominal   Peds  Hematology   Anesthesia Other Findings   Reproductive/Obstetrics                         Anesthesia Physical Anesthesia Plan  ASA: III  Anesthesia Plan: General   Post-op Pain Management:    Induction: Intravenous  Airway Management Planned: LMA  Additional Equipment:   Intra-op Plan:   Post-operative Plan: Extubation in OR  Informed Consent: I have reviewed the patients History and Physical, chart, labs and discussed the procedure including the risks, benefits and alternatives for the proposed anesthesia with the patient or authorized representative who has indicated his/her understanding and acceptance.   Dental advisory given  Plan Discussed with: CRNA, Anesthesiologist and Surgeon  Anesthesia Plan Comments:        Anesthesia Quick Evaluation

## 2012-09-11 NOTE — Anesthesia Procedure Notes (Signed)
Procedure Name: LMA Insertion Date/Time: 09/11/2012 7:37 AM Performed by: Williemae Area B Pre-anesthesia Checklist: Patient being monitored, Patient identified, Emergency Drugs available and Suction available Patient Re-evaluated:Patient Re-evaluated prior to inductionOxygen Delivery Method: Circle system utilized Preoxygenation: Pre-oxygenation with 100% oxygen Intubation Type: IV induction Ventilation: Mask ventilation without difficulty LMA: LMA inserted LMA Size: 4.0 Number of attempts: 1 Placement Confirmation: breath sounds checked- equal and bilateral and positive ETCO2 Tube secured with: Tape (across cheeks) Dental Injury: Teeth and Oropharynx as per pre-operative assessment

## 2012-09-11 NOTE — Telephone Encounter (Addendum)
Message copied by Doristine Section on Thu Sep 11, 2012 10:55 AM ------      Message from: Peter Minium K      Created: Thu Sep 11, 2012 10:06 AM      Regarding: schedule                   ----- Message -----         From: Gabriel Earing, PA-C         Sent: 09/11/2012   9:27 AM           To: Mena Goes, CMA            S/p right BVT 09/11/12.  F/u in 6-8 weeks with duplex of AVF with Dr. Kellie Simmering.            Thanks,      Aldona Bar ------  notified patient of fu appt. with dr. Kellie Simmering on 11-04-12 2:30

## 2012-09-11 NOTE — Op Note (Signed)
OPERATIVE REPORT  Date of Surgery: 09/11/2012  Surgeon: [name], MD  Assistant: Dionicio Stall  Pre-op Diagnosis: End Stage Renal Disease  Post-op Diagnosis: End Stage Renal Disease  Procedure: Procedure(s): [place]- RIGHT ARM  Anesthesia: LMA  EBL: Minimal  Complications: None  Procedure Details: Patient to the operating room placed in the supine position at which time satisfactory general LMA anesthesia was measured. Right upper extremity was prepped after marking the basilic vein using the ultrasound SonoSite. Prepping and draping routine sterile manner the basilic vein was exposed through multiple incisions along the medial aspect of the right upper extremity beginning in the antecubital area medially up to the axilla. There was a satisfactory vein. Branches were ligated with 3 and 4-0 silk ties divided transected after ligating it distally. Brachial artery was exposed in the distal upper arm through a separate small incision. The vein was tunneled in a curvilinear fashion anteriorly being careful to avoid any kinks. Artery was occluded proximally and distally a 15 blade extended with Potts scissors the vein was carefully measured spatulated and anastomosed end-to-side with 6-0 Prolene. Clamps released there was good pulse and thrill in the fistula with excellent Doppler flow. There is slight diminution of flow in the radial and ulnar arteries distally with the fistula patent which improved with compression of the fistula. No heparin was given. Adequate hemostasis was achieved wounds closed in layers of Vicryl subcuticular fashion with Dermabond patient taken to recovery in stable condition   [name], MD 09/11/2012 9:29 AM

## 2012-09-11 NOTE — Transfer of Care (Signed)
Immediate Anesthesia Transfer of Care Note  Patient: Vanessa Webb  Procedure(s) Performed: Procedure(s): [place]- RIGHT ARM (Right)  Patient Location: PACU  Anesthesia Type:General  Level of Consciousness: awake and alert   Airway & Oxygen Therapy: Patient Spontanous Breathing and Patient connected to nasal cannula oxygen  Post-op Assessment: Report given to PACU RN  Post vital signs: Reviewed and stable  Complications: No apparent anesthesia complications

## 2012-09-15 ENCOUNTER — Encounter (HOSPITAL_COMMUNITY): Payer: Self-pay | Admitting: Vascular Surgery

## 2012-10-10 DIAGNOSIS — R0602 Shortness of breath: Secondary | ICD-10-CM

## 2012-11-03 ENCOUNTER — Encounter: Payer: Self-pay | Admitting: Vascular Surgery

## 2012-11-04 ENCOUNTER — Encounter (INDEPENDENT_AMBULATORY_CARE_PROVIDER_SITE_OTHER): Payer: Medicare Other | Admitting: *Deleted

## 2012-11-04 ENCOUNTER — Encounter: Payer: Self-pay | Admitting: Vascular Surgery

## 2012-11-04 ENCOUNTER — Ambulatory Visit (INDEPENDENT_AMBULATORY_CARE_PROVIDER_SITE_OTHER): Payer: Medicare Other | Admitting: Vascular Surgery

## 2012-11-04 ENCOUNTER — Other Ambulatory Visit: Payer: Self-pay

## 2012-11-04 VITALS — BP 144/63 | HR 76 | Resp 16 | Ht 67.0 in | Wt 142.0 lb

## 2012-11-04 DIAGNOSIS — N186 End stage renal disease: Secondary | ICD-10-CM

## 2012-11-04 DIAGNOSIS — T82598A Other mechanical complication of other cardiac and vascular devices and implants, initial encounter: Secondary | ICD-10-CM

## 2012-11-04 DIAGNOSIS — Z4931 Encounter for adequacy testing for hemodialysis: Secondary | ICD-10-CM

## 2012-11-04 NOTE — Progress Notes (Signed)
[facility] POST OPERATIVE OFFICE NOTE  CC:  F/u for surgery  HPI:  This is a 67 y.o. female who is s/p right BVT on 09/11/12 by Dr. Kellie Simmering.  Pt states that she has done well since surgery.  She denies any symptoms of steal with the exception of her hand gets cool sometimes.    Allergies  Allergen Reactions  . Sulfa Antibiotics     nausea    Current Outpatient Prescriptions  Medication Sig Dispense Refill  . calcitRIOL (ROCALTROL) 0.25 MCG capsule Take 0.25 mcg by mouth daily.      . carvedilol (COREG) 3.125 MG tablet Take 3.125 mg by mouth 2 (two) times daily with a meal.      . cholecalciferol (VITAMIN D) 1000 UNITS tablet Take 1,000 Units by mouth daily.      . Cyanocobalamin (VITAMIN B 12 PO) Take 1 tablet by mouth daily.      . folic acid (FOLVITE) 1 MG tablet Take 1 mg by mouth daily.      . insulin aspart protamine-insulin aspart (NOVOLOG 70/30) (70-30) 100 UNIT/ML injection Inject into the skin 2 (two) times daily with a meal. Sliding scale      . insulin glargine (LANTUS) 100 UNIT/ML injection Inject into the skin daily. Sliding scale per patient      . metoCLOPramide (REGLAN) 10 MG tablet Take 10 mg by mouth 4 (four) times daily -  with meals and at bedtime.        Marland Kitchen omeprazole (PRILOSEC) 20 MG capsule Take 20 mg by mouth daily.      [place] (OYSTER CALCIUM) 500 MG TABS Take 1,000 mg of elemental calcium by mouth 3 (three) times daily.      . potassium chloride (K-DUR,KLOR-CON) 10 MEQ tablet Take 10 mEq by mouth 2 (two) times daily.        . simvastatin (ZOCOR) 40 MG tablet Take 40 mg by mouth at bedtime.        . vitamin C (ASCORBIC ACID) 500 MG tablet Take 500 mg by mouth daily.      . Omega-3 Fatty Acids (PRO NUTRIENTS OMEGA 3 PO) Take 2 tablets by mouth daily.      Marland Kitchen oxyCODONE (ROXICODONE) 5 MG immediate release tablet Take 1 tablet (5 mg total) by mouth every 6 (six) hours as needed for pain.  30 tablet  0   No current facility-administered  medications for this visit.     ROS:  See HPI  Physical Exam:  Filed Vitals:   11/04/12 1457  BP: 144/63  Pulse: 76  Resp: 16    Incision:  Healing nicely Extremities:  + palpable right radial pulse with equal grip bilaterally;  + thrill and bruit within the fistula   Duplex RUA AVF 11/04/12: -Patent right upper extremity AVF with elevated velocities in the brachial artery and anastomosis; there is no disease observed -anatomic narrowing in the mid upper arm with elevated velocities of 487 cm/s and .33 diameter.  A/P:  This is a 67 y.o. female here for f/u to her right BVT on 09/11/12.  Her fistula is working nicely, however, there is a narrowing midway up the fistula with increased velocities of 487 cm/s. Will set the pt up for a fistulogram in 4 weeks to give it time to mature more and use minimal contrast as the pt is not on HD yet.     Leontine Locket, PA-C [facility] [phone]  Clinic MD:  Pt seen and examined with Dr. Kellie Simmering  Patient has evidence of narrowed segment in midportion of right basilic vein transposition. There is excellent pulse and palpable thrill in fistula. The narrowed segment is visible through the skin in the midportion.  Feel we should proceed with a fistulogram in about 4 more weeks to see if this is amenable to balloon angioplasty if not will let the fistula be utilized. Fistula does have good flow throughout

## 2012-11-21 ENCOUNTER — Encounter (HOSPITAL_COMMUNITY): Payer: Self-pay | Admitting: Pharmacy Technician

## 2012-12-02 ENCOUNTER — Ambulatory Visit (HOSPITAL_COMMUNITY)
Admission: RE | Admit: 2012-12-02 | Discharge: 2012-12-02 | Disposition: A | Discharge status: Home / Self Care | Payer: Medicare Other | Source: Ambulatory Visit | Attending: Surgery | Admitting: Surgery

## 2012-12-02 ENCOUNTER — Encounter (HOSPITAL_COMMUNITY)
Admission: RE | Disposition: A | Discharge status: Home / Self Care | Payer: Self-pay | Source: Ambulatory Visit | Attending: Surgery

## 2012-12-02 DIAGNOSIS — I871 Compression of vein: Secondary | ICD-10-CM | POA: Insufficient documentation

## 2012-12-02 DIAGNOSIS — N186 End stage renal disease: Secondary | ICD-10-CM | POA: Insufficient documentation

## 2012-12-02 DIAGNOSIS — Y832 Surgical operation with anastomosis, bypass or graft as the cause of abnormal reaction of the patient, or of later complication, without mention of misadventure at the time of the procedure: Secondary | ICD-10-CM | POA: Insufficient documentation

## 2012-12-02 DIAGNOSIS — T82898A Other specified complication of vascular prosthetic devices, implants and grafts, initial encounter: Secondary | ICD-10-CM | POA: Insufficient documentation

## 2012-12-02 HISTORY — PX: SHUNTOGRAM: SHX5491

## 2012-12-02 LAB — POCT I-STAT, CHEM 8
Calcium, Ion: 1.25 mmol/L (ref 1.13–1.30)
Chloride: 111 mEq/L (ref 96–112)
HCT: 28 % — ABNORMAL LOW (ref 36.0–46.0)
Sodium: 145 mEq/L (ref 135–145)
TCO2: 21 mmol/L (ref 0–100)

## 2012-12-02 SURGERY — ASSESSMENT, SHUNT FUNCTION, WITH CONTRAST RADIOGRAPHIC STUDY
Anesthesia: LOCAL

## 2012-12-02 MED ORDER — HYDRALAZINE HCL 20 MG/ML IJ SOLN
10.0000 mg | INTRAMUSCULAR | Status: DC | PRN
  Administered 2012-12-02: 10 mg via INTRAVENOUS

## 2012-12-02 MED ORDER — SODIUM CHLORIDE 0.9 % IJ SOLN: 3.0000 mL | INTRAMUSCULAR | Status: DC | PRN

## 2012-12-02 MED ORDER — HEPARIN (PORCINE) IN NACL 2-0.9 UNIT/ML-% IJ SOLN
INTRAMUSCULAR | Status: AC
  Filled 2012-12-02: qty 500

## 2012-12-02 MED ORDER — HYDRALAZINE HCL 20 MG/ML IJ SOLN
INTRAMUSCULAR | Status: AC
  Filled 2012-12-02: qty 1

## 2012-12-02 MED ORDER — LABETALOL HCL 5 MG/ML IV SOLN: 10.0000 mg | INTRAVENOUS | Status: DC | PRN

## 2012-12-02 MED ORDER — LIDOCAINE HCL (PF) 1 % IJ SOLN
INTRAMUSCULAR | Status: AC
  Filled 2012-12-02: qty 30

## 2012-12-02 NOTE — Op Note (Signed)
[facility] [place]  Patient name: Vanessa Webb MRN: [id number] DOB: Jun 27, 1945 Sex: female  12/02/2012 Pre-operative Diagnosis: Non-maturing right basilic vein transposition Post-operative diagnosis:  Same Surgeon:  Eldridge Abrahams Procedure Performed:  1.  ultrasound-guided access, right basilic vein  2.  fistulogram   Indications:  The patient has recently undergone right basilic vein transposition.  Ultrasound identified a stenosis within its midportion.  She comes in for further evaluation.  She is not yet on dialysis.  Procedure:  The patient was identified in the holding area and taken to room 8.  The patient was then placed supine on the table and prepped and draped in the usual sterile fashion.  A time out was called.  Ultrasound was used to evaluate the fistula.  The vein was patent and compressible.  A digital ultrasound image was acquired.  The fistula was then accessed under ultrasound guidance using a micropuncture needle.  An 018 wire was then asvanced without resistance and a micropuncture sheath was placed.  Contrast injections were then performed through the sheath.  Findings:  The area of concern identified on ultrasound correlates to a 20-30% stenosis.  This lesion was evaluated in 2 different views and the degree of stenosis did not change   Intervention:  None  Impression:  #1  area identified on ultrasound corresponded to a 20-30% stenosis, therefore no intervention was performed  #2  a total of 5 cc of contrast was utilized for the study    V. Annamarie Major, M.D. [facility] [place] Office: [phone] Pager:  [phone]

## 2012-12-02 NOTE — Interval H&P Note (Signed)
History and Physical Interval Note:  12/02/2012 7:34 AM  Vanessa Webb  has presented today for surgery, with the diagnosis of End stage renal  The various methods of treatment have been discussed with the patient and family. After consideration of risks, benefits and other options for treatment, the patient has consented to  Procedure(s): Fistulagram (N/A) as a surgical intervention .  The patient's history has been reviewed, patient examined, no change in status, stable for surgery.  I have reviewed the patient's chart and labs.  Questions were answered to the patient's satisfaction.     Rhondalyn Clingan IV, V. WELLS

## 2012-12-02 NOTE — H&P (View-Only) (Signed)
[facility] POST OPERATIVE OFFICE NOTE  CC:  F/u for surgery  HPI:  This is a 67 y.o. female who is s/p right BVT on 09/11/12 by Dr. Kellie Simmering.  Pt states that she has done well since surgery.  She denies any symptoms of steal with the exception of her hand gets cool sometimes.    Allergies  Allergen Reactions  . Sulfa Antibiotics     nausea    Current Outpatient Prescriptions  Medication Sig Dispense Refill  . calcitRIOL (ROCALTROL) 0.25 MCG capsule Take 0.25 mcg by mouth daily.      . carvedilol (COREG) 3.125 MG tablet Take 3.125 mg by mouth 2 (two) times daily with a meal.      . cholecalciferol (VITAMIN D) 1000 UNITS tablet Take 1,000 Units by mouth daily.      . Cyanocobalamin (VITAMIN B 12 PO) Take 1 tablet by mouth daily.      . folic acid (FOLVITE) 1 MG tablet Take 1 mg by mouth daily.      . insulin aspart protamine-insulin aspart (NOVOLOG 70/30) (70-30) 100 UNIT/ML injection Inject into the skin 2 (two) times daily with a meal. Sliding scale      . insulin glargine (LANTUS) 100 UNIT/ML injection Inject into the skin daily. Sliding scale per patient      . metoCLOPramide (REGLAN) 10 MG tablet Take 10 mg by mouth 4 (four) times daily -  with meals and at bedtime.        Marland Kitchen omeprazole (PRILOSEC) 20 MG capsule Take 20 mg by mouth daily.      Loma Boston (OYSTER CALCIUM) 500 MG TABS Take 1,000 mg of elemental calcium by mouth 3 (three) times daily.      . potassium chloride (K-DUR,KLOR-CON) 10 MEQ tablet Take 10 mEq by mouth 2 (two) times daily.        . simvastatin (ZOCOR) 40 MG tablet Take 40 mg by mouth at bedtime.        . vitamin C (ASCORBIC ACID) 500 MG tablet Take 500 mg by mouth daily.      . Omega-3 Fatty Acids (PRO NUTRIENTS OMEGA 3 PO) Take 2 tablets by mouth daily.      Marland Kitchen oxyCODONE (ROXICODONE) 5 MG immediate release tablet Take 1 tablet (5 mg total) by mouth every 6 (six) hours as needed for pain.  30 tablet  0   No current facility-administered  medications for this visit.     ROS:  See HPI  Physical Exam:  Filed Vitals:   11/04/12 1457  BP: 144/63  Pulse: 76  Resp: 16    Incision:  Healing nicely Extremities:  + palpable right radial pulse with equal grip bilaterally;  + thrill and bruit within the fistula   Duplex RUA AVF 11/04/12: -Patent right upper extremity AVF with elevated velocities in the brachial artery and anastomosis; there is no disease observed -anatomic narrowing in the mid upper arm with elevated velocities of 487 cm/s and .33 diameter.  A/P:  This is a 67 y.o. female here for f/u to her right BVT on 09/11/12.  Her fistula is working nicely, however, there is a narrowing midway up the fistula with increased velocities of 487 cm/s. Will set the pt up for a fistulogram in 4 weeks to give it time to mature more and use minimal contrast as the pt is not on HD yet.     Leontine Locket, PA-C [facility] [phone]  Clinic MD:  Pt seen and examined with Dr. Kellie Simmering  Patient has evidence of narrowed segment in midportion of right basilic vein transposition. There is excellent pulse and palpable thrill in fistula. The narrowed segment is visible through the skin in the midportion.  Feel we should proceed with a fistulogram in about 4 more weeks to see if this is amenable to balloon angioplasty if not will let the fistula be utilized. Fistula does have good flow throughout

## 2014-02-04 ENCOUNTER — Encounter (HOSPITAL_COMMUNITY): Payer: Self-pay | Admitting: Surgery

## 2015-03-11 ENCOUNTER — Other Ambulatory Visit: Payer: Self-pay | Admitting: Podiatry

## 2015-03-11 DIAGNOSIS — I739 Peripheral vascular disease, unspecified: Secondary | ICD-10-CM

## 2015-03-18 ENCOUNTER — Encounter: Payer: Self-pay | Admitting: Vascular Surgery

## 2015-03-18 ENCOUNTER — Ambulatory Visit (INDEPENDENT_AMBULATORY_CARE_PROVIDER_SITE_OTHER): Payer: Medicare Other | Admitting: Vascular Surgery

## 2015-03-18 ENCOUNTER — Other Ambulatory Visit: Payer: Self-pay | Admitting: *Deleted

## 2015-03-18 ENCOUNTER — Ambulatory Visit (HOSPITAL_COMMUNITY)
Admission: RE | Admit: 2015-03-18 | Discharge: 2015-03-18 | Disposition: A | Discharge status: Home / Self Care | Payer: Medicare Other | Source: Ambulatory Visit | Attending: Vascular Surgery | Admitting: Vascular Surgery

## 2015-03-18 VITALS — BP 180/72 | HR 70 | Temp 97.1°F | Resp 16 | Ht 63.75 in | Wt 140.0 lb

## 2015-03-18 DIAGNOSIS — I10 Essential (primary) hypertension: Secondary | ICD-10-CM | POA: Insufficient documentation

## 2015-03-18 DIAGNOSIS — I7025 Atherosclerosis of native arteries of other extremities with ulceration: Secondary | ICD-10-CM | POA: Diagnosis not present

## 2015-03-18 DIAGNOSIS — E1142 Type 2 diabetes mellitus with diabetic polyneuropathy: Secondary | ICD-10-CM | POA: Diagnosis not present

## 2015-03-18 DIAGNOSIS — E785 Hyperlipidemia, unspecified: Secondary | ICD-10-CM | POA: Insufficient documentation

## 2015-03-18 DIAGNOSIS — E118 Type 2 diabetes mellitus with unspecified complications: Secondary | ICD-10-CM

## 2015-03-18 DIAGNOSIS — R938 Abnormal findings on diagnostic imaging of other specified body structures: Secondary | ICD-10-CM | POA: Diagnosis not present

## 2015-03-18 DIAGNOSIS — I70239 Atherosclerosis of native arteries of right leg with ulceration of unspecified site: Secondary | ICD-10-CM

## 2015-03-18 DIAGNOSIS — I70249 Atherosclerosis of native arteries of left leg with ulceration of unspecified site: Secondary | ICD-10-CM | POA: Insufficient documentation

## 2015-03-18 DIAGNOSIS — N186 End stage renal disease: Secondary | ICD-10-CM

## 2015-03-18 MED ORDER — COLLAGENASE 250 UNIT/GM EX OINT: 1.0000 "application " | TOPICAL_OINTMENT | Freq: Every day | CUTANEOUS | Status: DC

## 2015-03-18 NOTE — Progress Notes (Signed)
Filed Vitals:   03/18/15 1340 03/18/15 1349  BP: 180/67 180/72  Pulse: 70 70  Temp: 97.1 F (36.2 C)   TempSrc: Oral   Resp: 16   Height: 5' 3.75" (1.619 m)   Weight: 140 lb (63.504 kg)   SpO2: 98%

## 2015-03-18 NOTE — Progress Notes (Signed)
Referred by:  Zella Richer. [address], [place], [place] [address]  Reason for referral: Right foot ulcers  History of Present Illness  Vanessa Webb is a 70 y.o. (20-Mar-1945) female who presents with chief complaint: right foot ulcers.  Onset of symptom occurred 2 weeks ago, patient development a blistered on side of L 5th toe.  Pain is described as achy, severity 15/10, and associated with manipulating R foot.  Patient has attempted to treat this pain with rest and wound care.  The patient has no rest pain symptoms.  She has been getting serial debridement.  She denies any purulence or fever and chills.  Atherosclerotic risk factors include: IDDM, HTN, and ESRD.  Past Medical History  Diagnosis Date  . UTI (lower urinary tract infection)   . Pancreatitis   . Myocardial infarction ([place]) 2010  . Hypertension     takes Coreg daily  . Hyperlipidemia     takes Zocor daily  . Peripheral neuropathy ([place])   . GERD (gastroesophageal reflux disease)     takes Omeprazole daily  . GI bleeding   . History of bladder infections     last one a month ago;saw Dr.Wrenn and cysto was done in office;was on antibiotics and completed 2wks ago  . History of blood transfusion     no blood transfusion  . Anemia     takes Folic Acid;pt states she gets an injection every 2wks   . Diabetes mellitus     takes Lantus and Novolog 70/30;fasting blood sugar 200;Type 2  . Depression     but doesn't take any meds for it    Past Surgical History  Procedure Laterality Date  . Appendectomy    . Cataract extraction w/phaco  03/01/2011    Procedure: CATARACT EXTRACTION PHACO AND INTRAOCULAR LENS PLACEMENT (IOC);  Surgeon: Tonny Branch;  Location: [facility];  Service: Ophthalmology;  Laterality: Right;  CDE: 15.83  . Abdominal hysterectomy  1976  . Ruptured ulcer in stomach  2010    had a feeding tube over a month  . Cholecystectomy  2010  . Dilation and curettage of uterus  1965  . Cardiac  catheterization  2010  . Esophagogastroduodenoscopy    . Bilateral laser surgery    . Hemorrhage to bilateral eye    . Bascilic vein transposition Right 09/11/2012    Procedure: BASCILIC VEIN TRANSPOSITION- RIGHT ARM;  Surgeon: Mal Misty, MD;  Location: [facility];  Service: Vascular;  Laterality: Right;  . Shuntogram N/A 12/02/2012    Procedure: Fistulagram;  Surgeon: Serafina Mitchell, MD;  Location: [facility];  Service: Cardiovascular;  Laterality: N/A;    Social History   Social History  . Marital Status: Married    Spouse Name: N/A  . Number of Children: N/A  . Years of Education: N/A   Occupational History  . Not on file.   Social History Main Topics  . Smoking status: Never Smoker   . Smokeless tobacco: Never Used  . Alcohol Use: No  . Drug Use: No  . Sexual Activity: Yes    Birth Control/ Protection: Surgical   Other Topics Concern  . Not on file   Social History Narrative    Family History  Problem Relation Age of Onset  . Anesthesia problems Neg Hx   . Hypotension Neg Hx   . Malignant hyperthermia Neg Hx   . Pseudochol deficiency Neg Hx   . Diabetes Mother   .  Cancer Father     Current Outpatient Prescriptions  Medication Sig Dispense Refill  . calcitRIOL (ROCALTROL) 0.25 MCG capsule Take 0.25 mcg by mouth daily.    . carvedilol (COREG) 3.125 MG tablet Take 3.125 mg by mouth 2 (two) times daily with a meal.    . cholecalciferol (VITAMIN D) 1000 UNITS tablet Take 1,000 Units by mouth daily.    . Cyanocobalamin (VITAMIN B 12 PO) Take 1 tablet by mouth daily.    . folic acid (FOLVITE) 1 MG tablet Take 1 mg by mouth daily.    . insulin aspart protamine-insulin aspart (NOVOLOG 70/30) (70-30) 100 UNIT/ML injection Inject into the skin 2 (two) times daily with a meal. Sliding scale    . insulin glargine (LANTUS) 100 UNIT/ML injection Inject into the skin 2 (two) times daily. Sliding scale per patient    . metoCLOPramide (REGLAN) 10 MG tablet Take 10 mg by mouth  4 (four) times daily -  with meals and at bedtime.      . Omega-3 Fatty Acids (PRO NUTRIENTS OMEGA 3 PO) Take 2 tablets by mouth daily.    Marland Kitchen omeprazole (PRILOSEC) 20 MG capsule Take 20 mg by mouth daily.    Loma Boston (OYSTER CALCIUM) 500 MG TABS Take 1,000 mg of elemental calcium by mouth 3 (three) times daily.    . potassium chloride (K-DUR,KLOR-CON) 10 MEQ tablet Take 10 mEq by mouth 2 (two) times daily.      . simvastatin (ZOCOR) 40 MG tablet Take 40 mg by mouth at bedtime.      . vitamin C (ASCORBIC ACID) 500 MG tablet Take 500 mg by mouth daily.    Marland Kitchen ACCU-CHEK AVIVA PLUS test strip     . amLODipine (NORVASC) 5 MG tablet     . atorvastatin (LIPITOR) 20 MG tablet     . B Complex-C-Folic Acid (RENA-VITE RX) 1 MG TABS     . B-D INS SYR ULTRAFINE 1CC/31G 31G X 5/16" 1 ML MISC     . benzonatate (TESSALON) 100 MG capsule     . calcium acetate (PHOSLO) 667 MG capsule     . collagenase (SANTYL) ointment Apply 1 application topically daily. 30 g 1  . doxycycline (VIBRA-TABS) 100 MG tablet     . ipratropium-albuterol (DUONEB) 0.5-2.5 (3) MG/3ML SOLN     . levofloxacin (LEVAQUIN) 500 MG tablet     . lidocaine-prilocaine (EMLA) cream      No current facility-administered medications for this visit.    Allergies  Allergen Reactions  . Sulfa Antibiotics Nausea Only    nausea     REVIEW OF SYSTEMS:  (Positives checked otherwise negative)  CARDIOVASCULAR:   [ ]  chest pain,  [ ]  chest pressure,  [ ]  palpitations,  [ ]  shortness of breath when laying flat,  [ ]  shortness of breath with exertion,   [ ]  pain in feet when walking,  [x]  pain in feet when laying flat, [ ]  history of blood clot in veins (DVT),  [ ]  history of phlebitis,  [x]  swelling in legs,  [ ]  varicose veins  PULMONARY:   [ ]  productive cough,  [ ]  asthma,  [ ]  wheezing  NEUROLOGIC:   [ ]  weakness in arms or legs,  [ ]  numbness in arms or legs,  [ ]  difficulty speaking or slurred speech,  [ ]  temporary loss  of vision in one eye,  [ ]  dizziness  HEMATOLOGIC:   [ ]  bleeding problems,  [ ]   problems with blood clotting too easily  MUSCULOSKEL:   [ ]  joint pain, [ ]  joint swelling  GASTROINTEST:   [ ]  vomiting blood,  [ ]  blood in stool     GENITOURINARY:   [ ]  burning with urination,  [ ]  blood in urine  PSYCHIATRIC:   [ ]  history of major depression  INTEGUMENTARY:   [x]  rashes,  [ ]  ulcers  CONSTITUTIONAL:   [ ]  fever,  [ ]  chills   For VQI Use Only  PRE-ADM LIVING: Home  AMB STATUS: Ambulatory  CAD Sx: None  PRIOR CHF: None  STRESS TEST: [x]  No, [ ]  Normal, [ ]  + ischemia, [ ]  + MI, [ ]  Both   Physical Examination  Filed Vitals:   03/18/15 1340 03/18/15 1349  BP: 180/67 180/72  Pulse: 70 70  Temp: 97.1 F (36.2 C)   TempSrc: Oral   Resp: 16   Height: 5' 3.75" (1.619 m)   Weight: 140 lb (63.504 kg)   SpO2: 98%    Body mass index is 24.23 kg/(m^2).  General: A&O x 3, WDWN  Head: Vanessa Webb/AT  Ear/Nose/Throat: Hearing grossly intact, nares w/o erythema or drainage, oropharynx w/o Erythema/Exudate, Mallampati score: 3  Eyes: PERRLA, EOMI  Neck: Supple, no nuchal rigidity, no palpable LAD  Pulmonary: Sym exp, good air movt, CTAB, no rales, rhonchi, & wheezing  Cardiac: RRR, Nl S1, S2, no Murmurs, rubs or gallops Vascular: Vessel Right Left  Radial Not Palpable Palpable  Ulnar Not Palpable Palpable  Brachial Palpable Palpable  Carotid Palpable, without bruit Palpable, without bruit  Aorta Not palpable N/A  Femoral Palpable Palpable  Popliteal Palpable Palpable  PT Palpable Palpable  DP Palpable Palpable   Gastrointestinal: soft, NTND, -G/R, - HSM, - masses, - CVAT B  Musculoskeletal: M/S 5/5 throughout , Extremities without ischemic changes, +bruit and thrill in RUA, R foot: ulcer with black eschar at base overlying lateral 5th MT, Shallow plantar ulcer overlying 1st MT, B edema 1+ (L>R), no LDS, visible varicoses veins (R>L)  Neurologic: CN  2-12 intact , Pain and light touch intact in extremities , Motor exam as listed above  Psychiatric: Judgment intact, Mood & affect appropriate for pt's clinical situation  Dermatologic: See M/S exam for extremity exam, no rashes otherwise noted  Lymph : No Cervical, Axillary, or Inguinal lymphadenopathy    Non-Invasive Vascular Imaging  Outside ABI (Date: 03/09/15)  R: [place], DP: tri, PT: tri, TBI: 0.58  L: [place] , DP:tri , PT: tri, TBI:   BLE arterial duplex (03/18/2015)  R: Bi/triphasic throughout except mid-distal AT  L: Bi/triphasic throughout except mid-distal AT   Outside Studies/Documentation 10 pages of outside documents were reviewed including: outside wound clinic chart, outside physiologic study.   Medical Decision Making  Vanessa Webb is a 70 y.o. female who presents with: RLE critical limb ischemia in the form ulcers, ESRD, LLE medial calcification without significant PAD yet   TBI 0.58 in right foot, so nearly at 0.60 threshold that any wound in the R foot should heal.  Would send debrided tissue for evaluation for calciphylaxis at next round of debridement.  Additional pharmacologic intervention with the help of her nephrologist might be needed if she is determined to have calciphlaxis.  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  Added some Santyl to the R 5th MT ulcer  with wet-to-dry dressing to see if chemical debridement can help the process along.  I discussed in depth with the patient the nature of atherosclerosis, and emphasized the importance of maximal medical management including strict control of blood pressure, blood glucose, and lipid levels, antiplatelet agents, obtaining regular exercise, and cessation of smoking.  The patient is aware that without maximal medical management the underlying  atherosclerotic disease process will progress, limiting the benefit of any interventions. The patient is currently on a statin:  Lipitor and Zocor are listed. The patient is currently not on an anti-platelet.  The patient should be started on ASA 81 mg PO daily. Follow up in 1 month for wound check.  Thank you for allowing [place] to participate in this patient's care.   Adele Barthel, MD [facility] [place] Office: [phone] Pager: [phone]  03/18/2015, 2:28 PM

## 2015-03-18 NOTE — Addendum Note (Signed)
Addended by: Dorthula Rue L on: 03/18/2015 04:47 PM   Modules accepted: Orders

## 2015-04-07 ENCOUNTER — Other Ambulatory Visit: Payer: Self-pay

## 2015-04-13 ENCOUNTER — Encounter (HOSPITAL_COMMUNITY): Payer: Medicare Other

## 2015-04-13 ENCOUNTER — Ambulatory Visit: Payer: Medicare Other | Admitting: Vascular Surgery

## 2015-04-19 ENCOUNTER — Other Ambulatory Visit: Payer: Self-pay

## 2016-03-02 ENCOUNTER — Encounter: Payer: Self-pay | Admitting: *Deleted

## 2016-03-02 ENCOUNTER — Ambulatory Visit (INDEPENDENT_AMBULATORY_CARE_PROVIDER_SITE_OTHER): Payer: Medicare Other | Admitting: Cardiology

## 2016-03-02 ENCOUNTER — Encounter: Payer: Self-pay | Admitting: Cardiology

## 2016-03-02 VITALS — BP 185/68 | HR 66 | Ht 67.0 in | Wt 129.0 lb

## 2016-03-02 DIAGNOSIS — R000 Tachycardia, unspecified: Secondary | ICD-10-CM | POA: Diagnosis not present

## 2016-03-02 DIAGNOSIS — I471 Supraventricular tachycardia: Secondary | ICD-10-CM

## 2016-03-02 DIAGNOSIS — I340 Nonrheumatic mitral (valve) insufficiency: Secondary | ICD-10-CM | POA: Diagnosis not present

## 2016-03-02 MED ORDER — METOPROLOL TARTRATE 25 MG PO TABS: 37.5000 mg | ORAL_TABLET | Freq: Two times a day (BID) | ORAL | 3 refills | Status: DC

## 2016-03-02 NOTE — Patient Instructions (Addendum)
Your physician recommends that you schedule a follow-up appointment in: [place] physician has recommended you make the following change in your medication:  Start Lopressor 37.5 mg Two Times Daily STOP Taking Digoxin    Your physician has requested that you have an echocardiogram. Echocardiography is a painless test that uses sound waves to create images of your heart. It provides your doctor with information about the size and shape of your heart and how well your heart's chambers and valves are working. This procedure takes approximately one hour. There are no restrictions for this procedure.  If you need a refill on your cardiac medications before your next appointment, please call your pharmacy.  Thank you for choosing [facility]!

## 2016-03-02 NOTE — Progress Notes (Addendum)
Clinical Summary Vanessa Webb is a 71 y.o.female seen as a new patient  1. Tachycardia - patient with recent admission to [facility]. Issues with tachycardia, notes mention episodes of SVT and afib - available EKG shows only narrow complex regular tachycardia consistent, SVT. Do not see documented evidence of aifb. She was not started on anticoagulation.     2. Mitral valve regurgitation - notes from [facility] mention history of mitral regurgitation. Details are unclear, from clinic notes plans were for repeat echo in late 2017, does not appear this was completed . - no recent issues with LE edema, SOB or DOE.   3. ESRD - compliant with HD.    Past Medical History:  Diagnosis Date  . Anemia    takes Folic Acid;pt states she gets an injection every 2wks   . Depression    but doesn't take any meds for it  . Diabetes mellitus    takes Lantus and Novolog 70/30;fasting blood sugar 200;Type 2  . GERD (gastroesophageal reflux disease)    takes Omeprazole daily  . GI bleeding   . History of bladder infections    last one a month ago;saw Dr.Wrenn and cysto was done in office;was on antibiotics and completed 2wks ago  . History of blood transfusion    no blood transfusion  . Hyperlipidemia    takes Zocor daily  . Hypertension    takes Coreg daily  . Myocardial infarction 2010  . Pancreatitis   . Peripheral neuropathy ([place])   . UTI (lower urinary tract infection)      Allergies  Allergen Reactions  . Sulfa Antibiotics Nausea Only    nausea     Current Outpatient Prescriptions  Medication Sig Dispense Refill  . ACCU-CHEK AVIVA PLUS test strip     . amLODipine (NORVASC) 5 MG tablet     . atorvastatin (LIPITOR) 20 MG tablet     . B Complex-C-Folic Acid (RENA-VITE RX) 1 MG TABS     . B-D INS SYR ULTRAFINE 1CC/31G 31G X 5/16" 1 ML MISC     . benzonatate (TESSALON) 100 MG capsule     . calcitRIOL (ROCALTROL) 0.25 MCG capsule Take 0.25 mcg by mouth daily.    .  calcium acetate (PHOSLO) 667 MG capsule     . carvedilol (COREG) 3.125 MG tablet Take 3.125 mg by mouth 2 (two) times daily with a meal.    . cholecalciferol (VITAMIN D) 1000 UNITS tablet Take 1,000 Units by mouth daily.    . collagenase (SANTYL) ointment Apply 1 application topically daily. 30 g 1  . Cyanocobalamin (VITAMIN B 12 PO) Take 1 tablet by mouth daily.    Marland Kitchen doxycycline (VIBRA-TABS) 100 MG tablet     . folic acid (FOLVITE) 1 MG tablet Take 1 mg by mouth daily.    . insulin aspart protamine-insulin aspart (NOVOLOG 70/30) (70-30) 100 UNIT/ML injection Inject into the skin 2 (two) times daily with a meal. Sliding scale    . insulin glargine (LANTUS) 100 UNIT/ML injection Inject into the skin 2 (two) times daily. Sliding scale per patient    . ipratropium-albuterol (DUONEB) 0.5-2.5 (3) MG/3ML SOLN     . levofloxacin (LEVAQUIN) 500 MG tablet     . lidocaine-prilocaine (EMLA) cream     . metoCLOPramide (REGLAN) 10 MG tablet Take 10 mg by mouth 4 (four) times daily -  with meals and at bedtime.      . Omega-3 Fatty Acids (PRO NUTRIENTS OMEGA 3  PO) Take 2 tablets by mouth daily.    Marland Kitchen omeprazole (PRILOSEC) 20 MG capsule Take 20 mg by mouth daily.    Loma Boston (OYSTER CALCIUM) 500 MG TABS Take 1,000 mg of elemental calcium by mouth 3 (three) times daily.    . potassium chloride (K-DUR,KLOR-CON) 10 MEQ tablet Take 10 mEq by mouth 2 (two) times daily.      . simvastatin (ZOCOR) 40 MG tablet Take 40 mg by mouth at bedtime.      . vitamin C (ASCORBIC ACID) 500 MG tablet Take 500 mg by mouth daily.     No current facility-administered medications for this visit.      Past Surgical History:  Procedure Laterality Date  . ABDOMINAL HYSTERECTOMY  1976  . APPENDECTOMY    . BASCILIC VEIN TRANSPOSITION Right 09/11/2012   Procedure: BASCILIC VEIN TRANSPOSITION- RIGHT ARM;  Surgeon: Mal Misty, MD;  Location: [place];  Service: Vascular;  Laterality: Right;  . bilateral laser surgery    .  CARDIAC CATHETERIZATION  2010  . CATARACT EXTRACTION W/PHACO  03/01/2011   Procedure: CATARACT EXTRACTION PHACO AND INTRAOCULAR LENS PLACEMENT (IOC);  Surgeon: Tonny Ladonya Jerkins;  Location: [facility];  Service: Ophthalmology;  Laterality: Right;  CDE: 15.83  . CHOLECYSTECTOMY  2010  . DILATION AND CURETTAGE OF UTERUS  1965  . ESOPHAGOGASTRODUODENOSCOPY    . hemorrhage to bilateral eye    . ruptured ulcer in stomach  2010   had a feeding tube over a month  . SHUNTOGRAM N/A 12/02/2012   Procedure: Fistulagram;  Surgeon: Serafina Mitchell, MD;  Location[facility] C[facility] LAB;  Service: Cardiovascular;  Laterality: N/A;     Allergies  Allergen Reactions  . Sulfa Antibiotics Nausea Only    nausea      Family History  Problem Relation Age of Onset  . Anesthesia problems Neg Hx   . Hypotension Neg Hx   . Malignant hyperthermia Neg Hx   . Pseudochol deficiency Neg Hx   . Diabetes Mother   . Cancer Father      Social History Ms. Dace reports that she has never smoked. She has never used smokeless tobacco. Ms. Michna reports that she does not drink alcohol.   Review of Systems CONSTITUTIONAL: No weight loss, fever, chills, weakness or fatigue.  HEENT: Eyes: No visual loss, blurred vision, double vision or yellow sclerae.No hearing loss, sneezing, congestion, runny nose or sore throat.  SKIN: No rash or itching.  CARDIOVASCULAR: per HPI RESPIRATORY: No shortness of breath, cough or sputum.  GASTROINTESTINAL: No anorexia, nausea, vomiting or diarrhea. No abdominal pain or blood.  GENITOURINARY: No burning on urination, no polyuria NEUROLOGICAL: No headache, dizziness, syncope, paralysis, ataxia, numbness or tingling in the extremities. No change in bowel or bladder control.  MUSCULOSKELETAL: No muscle, back pain, joint pain or stiffness.  LYMPHATICS: No enlarged nodes. No history of splenectomy.  PSYCHIATRIC: No history of depression or anxiety.  ENDOCRINOLOGIC: No reports of sweating, cold or heat  intolerance. No polyuria or polydipsia.  Marland Kitchen   Physical Examination Vitals:   03/02/16 1251  BP: (!) 185/68  Pulse: 66   Vitals:   03/02/16 1251  Weight: 129 lb (58.5 kg)  Height: 5\' 7"  (1.702 m)    Gen: resting comfortably, no acute distress HEENT: no scleral icterus, pupils equal round and reactive, no palptable cervical adenopathy,  CV: RRR, no m/r/g, no jvd Resp: Clear to auscultation bilaterally GI: abdomen is soft, non-tender, non-distended, normal bowel sounds, no  hepatosplenomegaly MSK: extremities are warm, no edema.  Skin: warm, no rash Neuro:  no focal deficits Psych: appropriate affect     Assessment and Plan   1. Tachycardia/PSVT - hospital notes mention svt and afib, only avaialble EKG shows SVT. At this time afib has not been confirmed, will not start anticoag - request all EKGs from recent admit. If no afib will obtain 21 day event monitor to further evaluate - with her ESRD we will stop digoxin, increase lopressor to 37.5mg  bid  2. Mitral regrugitation - mentioned in [facility] notes, details are unclear - repeat echo.   F/u 6 weeks  Arnoldo Lenis, M.D  EKGs reviewed from [place] [place]. Previous tracings do show episodes of afib. Her CHADS2Vasc score is 4, anticoagulation is indicated. In setting of ESRD coumadin would be favored option. Will have her return to [facility] in 1-2 weeks to discuss.   Carlyle Dolly MD

## 2016-03-04 ENCOUNTER — Encounter: Payer: Self-pay | Admitting: Cardiology

## 2016-03-07 ENCOUNTER — Ambulatory Visit (HOSPITAL_COMMUNITY)
Admission: RE | Admit: 2016-03-07 | Discharge: 2016-03-07 | Disposition: A | Discharge status: Home / Self Care | Payer: Medicare Other | Source: Ambulatory Visit | Attending: Cardiology | Admitting: Cardiology

## 2016-03-07 DIAGNOSIS — I517 Cardiomegaly: Secondary | ICD-10-CM | POA: Diagnosis not present

## 2016-03-07 DIAGNOSIS — I371 Nonrheumatic pulmonary valve insufficiency: Secondary | ICD-10-CM | POA: Insufficient documentation

## 2016-03-07 DIAGNOSIS — I340 Nonrheumatic mitral (valve) insufficiency: Secondary | ICD-10-CM

## 2016-03-07 DIAGNOSIS — I358 Other nonrheumatic aortic valve disorders: Secondary | ICD-10-CM | POA: Diagnosis not present

## 2016-03-07 NOTE — Progress Notes (Signed)
*  PRELIMINARY RESULTS* Echocardiogram 2D Echocardiogram has been performed.  Vanessa Webb 03/07/2016, 12:57 PM

## 2016-03-13 ENCOUNTER — Telehealth: Payer: Self-pay

## 2016-03-13 ENCOUNTER — Telehealth: Payer: Self-pay | Admitting: *Deleted

## 2016-03-13 NOTE — Telephone Encounter (Signed)
-----   Message from Arnoldo Lenis, MD sent at 03/09/2016  2:17 PM EST ----- Echo shows normal heart pumping function. She has one heart valve that is moderately leaky, this is not an issue at this time and something we will just monitor  Zandra Abts MD

## 2016-03-13 NOTE — Telephone Encounter (Signed)
Unable to reach pt. Have left several messages for pt to return call. Will mail pt. Letter.

## 2016-03-13 NOTE — Telephone Encounter (Signed)
Called patient with test results. No answer. Unable to leave message.  

## 2016-03-16 ENCOUNTER — Ambulatory Visit (INDEPENDENT_AMBULATORY_CARE_PROVIDER_SITE_OTHER): Payer: Medicare Other | Admitting: Adult Health

## 2016-03-16 ENCOUNTER — Encounter: Payer: Self-pay | Admitting: Adult Health

## 2016-03-16 VITALS — BP 128/60 | HR 72 | Ht 67.0 in | Wt 127.0 lb

## 2016-03-16 DIAGNOSIS — I471 Supraventricular tachycardia: Secondary | ICD-10-CM

## 2016-03-16 DIAGNOSIS — I4891 Unspecified atrial fibrillation: Secondary | ICD-10-CM | POA: Diagnosis not present

## 2016-03-16 NOTE — Progress Notes (Signed)
Cardiology Office Note   Date:  03/16/2016   ID:  Vanessa Webb, DOB 1946-01-05, MRN [id number]  PCP:  Deloria Lair, MD  Cardiologist:  Cloria Spring, NP   No chief complaint on file.     History of Present Illness: Vanessa Webb is a 71 y.o. female who presents for ongoing assessment and management of tachycardia, mitral valve regurgitation, with history of end-stage renal disease and associated anemia. The patient was last seen by Dr. Harl Bowie on 03/02/2016. She apparently was at [facility] prior to that office visit with hospital notes documenting SVT and A. fib. She was to have a cardiac monitor placed if she continued to have symptoms. Echocardiogram was repeated.  Echocardiogram 03/07/2016 Left ventricle: The cavity size was normal. Wall thickness was   increased in a pattern of moderate LVH. Systolic function was   normal. The estimated ejection fraction was in the range of 55%   to 60%. Wall motion was normal; there were no regional wall   motion abnormalities. Diastolic dysfunction, indeterminate grade. - Aortic valve: Trileaflet; mildly calcified leaflets. - Mitral valve: Severely calcified annulus. Mildly thickened,   mildly calcified leaflets . There was moderate regurgitation. - Left atrium: The atrium was moderately dilated. - Pulmonic valve: There was mild regurgitation.  She comes today without complaints. She is tolerating her medications without fatigue or dyspnea. She states that the symptoms began when she was given steroid dose pack and also was taking pseudoephedrine in cough syrup. She has not been using either, and has no further episodes of tachycardia.   \ Past Medical History:  Diagnosis Date  . Anemia    takes Folic Acid;pt states she gets an injection every 2wks   . Depression    but doesn't take any meds for it  . Diabetes mellitus    takes Lantus and Novolog 70/30;fasting blood sugar 200;Type 2  . GERD (gastroesophageal reflux  disease)    takes Omeprazole daily  . GI bleeding   . History of bladder infections    last one a month ago;saw Dr.Wrenn and cysto was done in office;was on antibiotics and completed 2wks ago  . History of blood transfusion    no blood transfusion  . Hyperlipidemia    takes Zocor daily  . Hypertension    takes Coreg daily  . Myocardial infarction 2010  . Pancreatitis   . Peripheral neuropathy ([place])   . UTI (lower urinary tract infection)     Past Surgical History:  Procedure Laterality Date  . ABDOMINAL HYSTERECTOMY  1976  . APPENDECTOMY    . BASCILIC VEIN TRANSPOSITION Right 09/11/2012   Procedure: BASCILIC VEIN TRANSPOSITION- RIGHT ARM;  Surgeon: Mal Misty, MD;  Location: [place];  Service: Vascular;  Laterality: Right;  . bilateral laser surgery    . CARDIAC CATHETERIZATION  2010  . CATARACT EXTRACTION W/PHACO  03/01/2011   Procedure: CATARACT EXTRACTION PHACO AND INTRAOCULAR LENS PLACEMENT (IOC);  Surgeon: Tonny Branch;  Location: [facility];  Service: Ophthalmology;  Laterality: Right;  CDE: 15.83  . CHOLECYSTECTOMY  2010  . DILATION AND CURETTAGE OF UTERUS  1965  . ESOPHAGOGASTRODUODENOSCOPY    . hemorrhage to bilateral eye    . ruptured ulcer in stomach  2010   had a feeding tube over a month  . SHUNTOGRAM N/A 12/02/2012   Procedure: Fistulagram;  Surgeon: Serafina Mitchell, MD;  Location: [facility] CATH LAB;  Service: Cardiovascular;  Laterality: N/A;     Current Outpatient Prescriptions  Medication Sig Dispense Refill  . ACCU-CHEK AVIVA PLUS test strip     . aspirin EC 81 MG tablet Take by mouth.    Marland Kitchen atorvastatin (LIPITOR) 20 MG tablet     . B Complex-C-Folic Acid (RENA-VITE RX) 1 MG TABS     . B-D INS SYR ULTRAFINE 1CC/31G 31G X 5/16" 1 ML MISC     . calcium acetate (PHOSLO) 667 MG capsule 2 (two) times daily.    . insulin glargine (LANTUS) 100 UNIT/ML injection Inject into the skin 2 (two) times daily. Sliding scale per patient    . lidocaine-prilocaine (EMLA) cream     .  megestrol (MEGACE) 40 MG tablet Take 40 mg by mouth 2 (two) times daily.  0  . metoprolol tartrate (LOPRESSOR) 25 MG tablet Take 1.5 tablets (37.5 mg total) by mouth 2 (two) times daily. 180 tablet 3  . promethazine (PHENERGAN) 25 MG tablet Take 25 mg by mouth every 4 (four) hours as needed.  0  . simvastatin (ZOCOR) 40 MG tablet Take 40 mg by mouth at bedtime.      . vitamin C (ASCORBIC ACID) 500 MG tablet Take 500 mg by mouth daily.     No current facility-administered medications for this visit.     Allergies:   Sulfa antibiotics    Social History:  The patient  reports that she has never smoked. She has never used smokeless tobacco. She reports that she does not drink alcohol or use drugs.   Family History:  The patient's family history includes Cancer in her father; Diabetes in her daughter and mother; Heart attack in her daughter; Pneumonia in her daughter.    ROS: All other systems are reviewed and negative. Unless otherwise mentioned in H&P    PHYSICAL EXAM: VS:  There were no vitals taken for this visit. , BMI There is no height or weight on file to calculate BMI. GEN: Well nourished, well developed, in no acute distress  HEENT: normal  Neck: no JVD, carotid bruits, or masses Cardiac: RRR; murmur heard at the RSB, (from dialysis fistula) no murmurs, rubs, or gallops,no edema  Respiratory:  clear to auscultation bilaterally, normal work of breathing GI: soft, nontender, nondistended, + BS MS: no deformity or atrophy Dialysis fistula on the upper right arm, good palpable thrill. Skin: warm and dry, no rash Neuro:  Strength and sensation are intact Psych: euthymic mood, full affect   EKG:  The ekg ordered today demonstrates NSR, Intraventricular conduction delay. HR 64 bpm.   Recent Labs: No results found for requested labs within last 8760 hours.    Lipid Panel    Component Value Date/Time   CHOL  01/31/2009 0450    130 (NOTE) ATP III Classification:      < 200         mg/dL        Desirable     200 - 239     mg/dL        Borderline High     >= 240        mg/dL        High    TRIG 185 (H) 01/31/2009 0450      Wt Readings from Last 3 Encounters:  03/02/16 129 lb (58.5 kg)  03/18/15 140 lb (63.5 kg)  12/02/12 140 lb (63.5 kg)        ASSESSMENT AND PLAN:  1. Tachycardia: Heart rate is well controlled on metoprolol. She has had no further episodes  of racing heart rate. Her HR is in the 60 during dialysis. She does not need a cardiac monitor at this time. Will continue current regimen. She is to avoid steroids and pseudoephedrine.   2. Mitral Valve regurgitation: Continue current regimen as she is asymptomatic.   3. ESRD: Continue dialysis regimen.    Current medicines are reviewed at length with the patient today.    Labs/ tests ordered today include:  No orders of the defined types were placed in this encounter.    Disposition:   FU with Dr. Harl Bowie as scheduled.    Signed, Jory Sims, NP  03/16/2016 7:27 AM    [facility] [address] [place], [place] [address] Phone: [phone]; Fax: [phone]

## 2016-03-16 NOTE — Progress Notes (Signed)
Name: Vanessa Webb    DOB: 10/19/1945  Age: 71 y.o.  MR#: [id number]       PCP:  Deloria Lair, MD      Insurance: Payor: [employer] MEDICARE / Plan: [facility] MEDICARE / Product Type: *No Product type* /   CC:   No chief complaint on file.   VS Vitals:   03/16/16 1443  BP: 128/60  Pulse: 72  SpO2: 94%  Weight: 127 lb (57.6 kg)  Height: '5\' 7"'  (1.702 m)    Weights Current Weight  03/16/16 127 lb (57.6 kg)  03/02/16 129 lb (58.5 kg)  03/18/15 140 lb (63.5 kg)    Blood Pressure  BP Readings from Last 3 Encounters:  03/16/16 128/60  03/02/16 (!) 185/68  03/18/15 (!) 180/72     Admit date:  (Not on file) Last encounter with RMR:  Visit date not found   Allergy Sulfa antibiotics  Current Outpatient Prescriptions  Medication Sig Dispense Refill  . ACCU-CHEK AVIVA PLUS test strip     . aspirin EC 81 MG tablet Take by mouth.    Marland Kitchen atorvastatin (LIPITOR) 20 MG tablet     . B Complex-C-Folic Acid (RENA-VITE RX) 1 MG TABS     . B-D INS SYR ULTRAFINE 1CC/31G 31G X 5/16" 1 ML MISC     . calcium acetate (PHOSLO) 667 MG capsule 2 (two) times daily.    . insulin glargine (LANTUS) 100 UNIT/ML injection Inject into the skin 2 (two) times daily. Sliding scale per patient    . lidocaine-prilocaine (EMLA) cream     . megestrol (MEGACE) 40 MG tablet Take 40 mg by mouth 2 (two) times daily.  0  . metoprolol tartrate (LOPRESSOR) 25 MG tablet Take 1.5 tablets (37.5 mg total) by mouth 2 (two) times daily. 180 tablet 3  . promethazine (PHENERGAN) 25 MG tablet Take 25 mg by mouth every 4 (four) hours as needed.  0  . simvastatin (ZOCOR) 40 MG tablet Take 40 mg by mouth at bedtime.      . vitamin C (ASCORBIC ACID) 500 MG tablet Take 500 mg by mouth daily.     No current facility-administered medications for this visit.     Discontinued Meds:   There are no discontinued medications.  Patient Active Problem List   Diagnosis Date Noted  . Atherosclerosis of native arteries of the  extremities with ulceration ([place]) 03/18/2015  . DM (diabetes mellitus) with complications ([place]) 38/45/3646  . Acute renal failure ([place]) 08/26/2012  . End stage renal disease ([place]) 08/26/2012    LABS    Component Value Date/Time   NA 145 12/02/2012 0619   NA 144 09/11/2012 0603   NA 142 02/21/2011 1100   K 4.1 12/02/2012 0619   K 4.5 09/11/2012 0603   K 3.7 02/21/2011 1100   CL 111 12/02/2012 0619   CL 106 02/21/2011 1100   CL 107 01/31/2009 0450   CO2 25 02/21/2011 1100   CO2 18 (L) 01/31/2009 0450   CO2 24 01/29/2009 0546   GLUCOSE 75 12/02/2012 0619   GLUCOSE 99 09/11/2012 0603   GLUCOSE 245 (H) 02/21/2011 1100   BUN 64 (H) 12/02/2012 0619   BUN 19 02/21/2011 1100   BUN 18 01/31/2009 0450   CREATININE 5.50 (H) 12/02/2012 0619   CREATININE 1.94 (H) 02/21/2011 1100   CREATININE 1.11 01/31/2009 0450   CALCIUM 8.0 (L) 02/21/2011 1100   CALCIUM 8.0 (L) 01/31/2009 0450   CALCIUM 8.3 (L) 01/29/2009 0546  GFRNONAA 26 (L) 02/21/2011 1100   GFRNONAA 49 (L) 01/31/2009 0450   GFRNONAA 49 (L) 01/29/2009 0546   GFRAA 30 (L) 02/21/2011 1100   GFRAA (L) 01/31/2009 0450    60        The eGFR has been calculated using the MDRD equation. This calculation has not been validated in all clinical situations. eGFR's persistently <60 mL/min signify possible Chronic Kidney Disease.   GFRAA (L) 01/29/2009 0546    59        The eGFR has been calculated using the MDRD equation. This calculation has not been validated in all clinical situations. eGFR's persistently <60 mL/min signify possible Chronic Kidney Disease.   CMP     Component Value Date/Time   NA 145 12/02/2012 0619   K 4.1 12/02/2012 0619   CL 111 12/02/2012 0619   CO2 25 02/21/2011 1100   GLUCOSE 75 12/02/2012 0619   BUN 64 (H) 12/02/2012 0619   CREATININE 5.50 (H) 12/02/2012 0619   CALCIUM 8.0 (L) 02/21/2011 1100   PROT 5.5 (L) 01/31/2009 0450   ALBUMIN 2.5 (L) 01/31/2009 0450   AST 25 01/31/2009 0450   ALT  16 01/31/2009 0450   ALKPHOS 51 01/31/2009 0450   BILITOT 0.5 01/31/2009 0450   GFRNONAA 26 (L) 02/21/2011 1100   GFRAA 30 (L) 02/21/2011 1100       Component Value Date/Time   WBC 6.1 02/21/2011 1112   WBC 9.9 01/31/2009 0450   WBC 8.9 01/29/2009 0546   HGB 9.5 (L) 12/02/2012 0619   HGB 8.8 (L) 09/11/2012 0603   HGB 9.7 (L) 02/21/2011 1112   HCT 28.0 (L) 12/02/2012 0619   HCT 26.0 (L) 09/11/2012 0603   HCT 28.1 (L) 02/21/2011 1112   MCV 83.1 02/21/2011 1112   MCV 89.6 01/31/2009 0450   MCV 90.8 01/29/2009 0546    Lipid Panel     Component Value Date/Time   CHOL  01/31/2009 0450    130 (NOTE) ATP III Classification:      < 200        mg/dL        Desirable     200 - 239     mg/dL        Borderline High     >= 240        mg/dL        High    TRIG 185 (H) 01/31/2009 0450    ABG    Component Value Date/Time   PHART 7.350 01/23/2009 0429   PCO2ART 27.3 (L) 01/23/2009 0429   PO2ART 109.0 (H) 01/23/2009 0429   HCO3 15.1 (L) 01/23/2009 0429   TCO2 21 12/02/2012 0619   ACIDBASEDEF 9.0 (H) 01/23/2009 0429   O2SAT 98.0 01/23/2009 0429     No results found for: TSH BNP (last 3 results) No results for input(s): BNP in the last 8760 hours.  ProBNP (last 3 results) No results for input(s): PROBNP in the last 8760 hours.  Cardiac Panel (last 3 results) No results for input(s): CKTOTAL, CKMB, TROPONINI, RELINDX in the last 72 hours.  Iron/TIBC/Ferritin/ %Sat No results found for: IRON, TIBC, FERRITIN, IRONPCTSAT   EKG Orders placed or performed in visit on 03/16/16  . EKG 12-Lead     Prior Assessment and Plan Problem List as of 03/16/2016 Reviewed: 03/04/2016  6:17 PM by Carlyle Dolly, MD     Cardiovascular and Mediastinum   Atherosclerosis of native arteries of the extremities with ulceration ([place])  Endocrine   DM (diabetes mellitus) with complications ([facility])     Genitourinary   Acute renal failure ([facility])   End stage renal disease ([place])       Imaging: No  results found.

## 2016-03-16 NOTE — Patient Instructions (Signed)
Your physician recommends that you schedule a follow-up appointment in: in [place] with Dr Harl Bowie as planned      Your physician recommends that you continue on your current medications as directed. Please refer to the Current Medication list given to you today.     Thank you for choosing [facility] !

## 2016-04-13 ENCOUNTER — Encounter: Payer: Self-pay | Admitting: Cardiology

## 2016-04-13 ENCOUNTER — Ambulatory Visit (INDEPENDENT_AMBULATORY_CARE_PROVIDER_SITE_OTHER): Payer: Medicare Other | Admitting: Cardiology

## 2016-04-13 VITALS — BP 163/67 | HR 65 | Ht 67.0 in

## 2016-04-13 DIAGNOSIS — I480 Paroxysmal atrial fibrillation: Secondary | ICD-10-CM | POA: Insufficient documentation

## 2016-04-13 DIAGNOSIS — I4891 Unspecified atrial fibrillation: Secondary | ICD-10-CM | POA: Diagnosis not present

## 2016-04-13 NOTE — Patient Instructions (Addendum)
Medication Instructions:   Your physician recommends that you continue on your current medications as directed. Please refer to the Current Medication list given to you today.  Labwork: NONE  Testing/Procedures: NONE  Follow-Up:  Your physician recommends that you schedule a follow-up appointment in: 4 months. You will receive a reminder letter in the mail in about 2 months reminding you to call and schedule your appointment. If you don't receive this letter, please contact our office.  Any Other Special Instructions Will Be Listed Below (If Applicable).  You are being referred to the Coumadin Clinic. You will see Edrick Oh RN.  If you need a refill on your cardiac medications before your next appointment, please call your pharmacy

## 2016-04-13 NOTE — Progress Notes (Signed)
Clinical Summary Vanessa Webb is a 71 y.o.female seen today for follow up of the following medical problems.   1. Tachycardia - patient with recent admission to [facility]. Issues with tachycardia, notes mention episodes of SVT and afib - initial available EKG shows only narrow complex regular tachycardia consistent, SVT. However since last visit, we requested additional records and EKGs from [facility] which did show previous episodes of atrial fibrillation. She returns today to discuss anticoagulation.  - CHADS2Vasc is 4 - she denies any recent palpitaitons.    2. ESRD - compliant with HD.  Past Medical History:  Diagnosis Date  . Anemia    takes Folic Acid;pt states she gets an injection every 2wks   . Depression    but doesn't take any meds for it  . Diabetes mellitus    takes Lantus and Novolog 70/30;fasting blood sugar 200;Type 2  . GERD (gastroesophageal reflux disease)    takes Omeprazole daily  . GI bleeding   . History of bladder infections    last one a month ago;saw Dr.Wrenn and cysto was done in office;was on antibiotics and completed 2wks ago  . History of blood transfusion    no blood transfusion  . Hyperlipidemia    takes Zocor daily  . Hypertension    takes Coreg daily  . Myocardial infarction 2010  . Pancreatitis   . Peripheral neuropathy ([place])   . UTI (lower urinary tract infection)      Allergies  Allergen Reactions  . Sulfa Antibiotics Nausea Only    nausea     Current Outpatient Prescriptions  Medication Sig Dispense Refill  . ACCU-CHEK AVIVA PLUS test strip     . aspirin EC 81 MG tablet Take by mouth.    Marland Kitchen atorvastatin (LIPITOR) 20 MG tablet     . B Complex-C-Folic Acid (RENA-VITE RX) 1 MG TABS     . B-D INS SYR ULTRAFINE 1CC/31G 31G X 5/16" 1 ML MISC     . calcium acetate (PHOSLO) 667 MG capsule 2 (two) times daily.    . insulin glargine (LANTUS) 100 UNIT/ML injection Inject into the skin 2 (two) times daily. Sliding scale per patient      . lidocaine-prilocaine (EMLA) cream     . megestrol (MEGACE) 40 MG tablet Take 40 mg by mouth 2 (two) times daily.  0  . metoprolol tartrate (LOPRESSOR) 25 MG tablet Take 1.5 tablets (37.5 mg total) by mouth 2 (two) times daily. 180 tablet 3  . promethazine (PHENERGAN) 25 MG tablet Take 25 mg by mouth every 4 (four) hours as needed.  0  . simvastatin (ZOCOR) 40 MG tablet Take 40 mg by mouth at bedtime.      . vitamin C (ASCORBIC ACID) 500 MG tablet Take 500 mg by mouth daily.     No current facility-administered medications for this visit.      Past Surgical History:  Procedure Laterality Date  . ABDOMINAL HYSTERECTOMY  1976  . APPENDECTOMY    . BASCILIC VEIN TRANSPOSITION Right 09/11/2012   Procedure: BASCILIC VEIN TRANSPOSITION- RIGHT ARM;  Surgeon: Mal Misty, MD;  Location: [facility];  Service: Vascular;  Laterality: Right;  . bilateral laser surgery    . CARDIAC CATHETERIZATION  2010  . CATARACT EXTRACTION W/PHACO  03/01/2011   Procedure: CATARACT EXTRACTION PHACO AND INTRAOCULAR LENS PLACEMENT (IOC);  Surgeon: Tonny Akiah Bauch;  Location: [facility];  Service: Ophthalmology;  Laterality: Right;  CDE: 15.83  . CHOLECYSTECTOMY  2010  .  DILATION AND CURETTAGE OF UTERUS  1965  . ESOPHAGOGASTRODUODENOSCOPY    . hemorrhage to bilateral eye    . ruptured ulcer in stomach  2010   had a feeding tube over a month  . SHUNTOGRAM N/A 12/02/2012   Procedure: Fistulagram;  Surgeon: Serafina Mitchell, MD;  Location: [facility] CATH LAB;  Service: Cardiovascular;  Laterality: N/A;     Allergies  Allergen Reactions  . Sulfa Antibiotics Nausea Only    nausea      Family History  Problem Relation Age of Onset  . Diabetes Mother   . Cancer Father   . Diabetes Daughter   . Heart attack Daughter   . Pneumonia Daughter   . Anesthesia problems Neg Hx   . Hypotension Neg Hx   . Malignant hyperthermia Neg Hx   . Pseudochol deficiency Neg Hx      Social History Ms. Belson reports that she has never smoked.  She has never used smokeless tobacco. Ms. Harries reports that she does not drink alcohol.   Review of Systems CONSTITUTIONAL: No weight loss, fever, chills, weakness or fatigue.  HEENT: Eyes: No visual loss, blurred vision, double vision or yellow sclerae.No hearing loss, sneezing, congestion, runny nose or sore throat.  SKIN: No rash or itching.  CARDIOVASCULAR: per hpi RESPIRATORY: No shortness of breath, cough or sputum.  GASTROINTESTINAL: No anorexia, nausea, vomiting or diarrhea. No abdominal pain or blood.  GENITOURINARY: No burning on urination, no polyuria NEUROLOGICAL: No headache, dizziness, syncope, paralysis, ataxia, numbness or tingling in the extremities. No change in bowel or bladder control.  MUSCULOSKELETAL: No muscle, back pain, joint pain or stiffness.  LYMPHATICS: No enlarged nodes. No history of splenectomy.  PSYCHIATRIC: No history of depression or anxiety.  ENDOCRINOLOGIC: No reports of sweating, cold or heat intolerance. No polyuria or polydipsia.  Marland Kitchen   Physical Examination Vitals:   04/13/16 1422  BP: (!) 163/67  Pulse: 65   Vitals:   04/13/16 1422  Height: 5\' 7"  (1.702 m)    Gen: resting comfortably, no acute distress HEENT: no scleral icterus, pupils equal round and reactive, no palptable cervical adenopathy,  CV: RRR, no m/r/g, no jvd Resp: Clear to auscultation bilaterally GI: abdomen is soft, non-tender, non-distended, normal bowel sounds, no hepatosplenomegaly MSK: extremities are warm, no edema.  Skin: warm, no rash Neuro:  no focal deficits Psych: appropriate affect   Diagnostic Studies     Assessment and Plan  1. PAF - CHADS2Vasc score is 4, we will start coumadin given her history of ESRD - no recent palpitaitons, conitnue beta blocker      Carlyle Dolly MD      Arnoldo Lenis, M.D., F.A.C.C.

## 2016-04-17 ENCOUNTER — Ambulatory Visit (INDEPENDENT_AMBULATORY_CARE_PROVIDER_SITE_OTHER): Payer: Medicare Other | Admitting: *Deleted

## 2016-04-17 DIAGNOSIS — N186 End stage renal disease: Secondary | ICD-10-CM | POA: Diagnosis not present

## 2016-04-17 DIAGNOSIS — I4891 Unspecified atrial fibrillation: Secondary | ICD-10-CM

## 2016-04-17 DIAGNOSIS — E118 Type 2 diabetes mellitus with unspecified complications: Secondary | ICD-10-CM | POA: Diagnosis not present

## 2016-04-17 DIAGNOSIS — I7025 Atherosclerosis of native arteries of other extremities with ulceration: Secondary | ICD-10-CM

## 2016-04-17 MED ORDER — WARFARIN SODIUM 2.5 MG PO TABS: 2.5000 mg | ORAL_TABLET | Freq: Every day | ORAL | 3 refills | Status: DC

## 2016-04-23 ENCOUNTER — Ambulatory Visit: Payer: Medicare Other | Admitting: Cardiology

## 2016-04-24 ENCOUNTER — Ambulatory Visit (INDEPENDENT_AMBULATORY_CARE_PROVIDER_SITE_OTHER): Payer: Medicare Other | Admitting: *Deleted

## 2016-04-24 DIAGNOSIS — N186 End stage renal disease: Secondary | ICD-10-CM

## 2016-04-24 DIAGNOSIS — I7025 Atherosclerosis of native arteries of other extremities with ulceration: Secondary | ICD-10-CM | POA: Diagnosis not present

## 2016-04-24 DIAGNOSIS — E118 Type 2 diabetes mellitus with unspecified complications: Secondary | ICD-10-CM | POA: Diagnosis not present

## 2016-04-24 DIAGNOSIS — I4891 Unspecified atrial fibrillation: Secondary | ICD-10-CM

## 2016-04-24 LAB — POCT INR
INR: 1.3
INR: 1.3

## 2016-04-30 LAB — POCT INR: INR: 1

## 2016-05-01 ENCOUNTER — Ambulatory Visit (INDEPENDENT_AMBULATORY_CARE_PROVIDER_SITE_OTHER): Payer: Medicare Other | Admitting: *Deleted

## 2016-05-01 DIAGNOSIS — I7025 Atherosclerosis of native arteries of other extremities with ulceration: Secondary | ICD-10-CM | POA: Diagnosis not present

## 2016-05-01 DIAGNOSIS — E118 Type 2 diabetes mellitus with unspecified complications: Secondary | ICD-10-CM | POA: Diagnosis not present

## 2016-05-01 DIAGNOSIS — I4891 Unspecified atrial fibrillation: Secondary | ICD-10-CM | POA: Diagnosis not present

## 2016-05-01 DIAGNOSIS — N186 End stage renal disease: Secondary | ICD-10-CM | POA: Diagnosis not present

## 2016-05-01 LAB — POCT INR: INR: 2.3

## 2016-05-07 ENCOUNTER — Other Ambulatory Visit: Payer: Self-pay | Admitting: *Deleted

## 2016-05-07 MED ORDER — WARFARIN SODIUM 2.5 MG PO TABS: 2.5000 mg | ORAL_TABLET | ORAL | 3 refills | Status: DC

## 2016-05-08 ENCOUNTER — Ambulatory Visit (INDEPENDENT_AMBULATORY_CARE_PROVIDER_SITE_OTHER): Payer: Medicare Other | Admitting: *Deleted

## 2016-05-08 DIAGNOSIS — I7025 Atherosclerosis of native arteries of other extremities with ulceration: Secondary | ICD-10-CM | POA: Diagnosis not present

## 2016-05-08 DIAGNOSIS — E118 Type 2 diabetes mellitus with unspecified complications: Secondary | ICD-10-CM | POA: Diagnosis not present

## 2016-05-08 DIAGNOSIS — N186 End stage renal disease: Secondary | ICD-10-CM

## 2016-05-08 DIAGNOSIS — I4891 Unspecified atrial fibrillation: Secondary | ICD-10-CM | POA: Diagnosis not present

## 2016-05-08 LAB — POCT INR: INR: 1.7

## 2016-05-22 ENCOUNTER — Ambulatory Visit (INDEPENDENT_AMBULATORY_CARE_PROVIDER_SITE_OTHER): Payer: Medicare Other | Admitting: *Deleted

## 2016-05-22 DIAGNOSIS — I7025 Atherosclerosis of native arteries of other extremities with ulceration: Secondary | ICD-10-CM | POA: Diagnosis not present

## 2016-05-22 DIAGNOSIS — I4891 Unspecified atrial fibrillation: Secondary | ICD-10-CM | POA: Diagnosis not present

## 2016-05-22 DIAGNOSIS — E118 Type 2 diabetes mellitus with unspecified complications: Secondary | ICD-10-CM | POA: Diagnosis not present

## 2016-05-22 DIAGNOSIS — N186 End stage renal disease: Secondary | ICD-10-CM

## 2016-05-22 LAB — POCT INR: INR: 2

## 2016-06-05 ENCOUNTER — Ambulatory Visit (INDEPENDENT_AMBULATORY_CARE_PROVIDER_SITE_OTHER): Payer: Medicare Other | Admitting: *Deleted

## 2016-06-05 DIAGNOSIS — E118 Type 2 diabetes mellitus with unspecified complications: Secondary | ICD-10-CM

## 2016-06-05 DIAGNOSIS — N186 End stage renal disease: Secondary | ICD-10-CM | POA: Diagnosis not present

## 2016-06-05 DIAGNOSIS — I4891 Unspecified atrial fibrillation: Secondary | ICD-10-CM | POA: Diagnosis not present

## 2016-06-05 DIAGNOSIS — I7025 Atherosclerosis of native arteries of other extremities with ulceration: Secondary | ICD-10-CM | POA: Diagnosis not present

## 2016-06-05 LAB — POCT INR: INR: 2.2

## 2016-06-21 ENCOUNTER — Other Ambulatory Visit: Payer: Self-pay

## 2016-06-21 MED ORDER — WARFARIN SODIUM 2.5 MG PO TABS: 2.5000 mg | ORAL_TABLET | ORAL | 3 refills | Status: DC

## 2016-06-26 ENCOUNTER — Ambulatory Visit (INDEPENDENT_AMBULATORY_CARE_PROVIDER_SITE_OTHER): Payer: Medicare Other | Admitting: *Deleted

## 2016-06-26 DIAGNOSIS — I4891 Unspecified atrial fibrillation: Secondary | ICD-10-CM | POA: Diagnosis not present

## 2016-06-26 DIAGNOSIS — E118 Type 2 diabetes mellitus with unspecified complications: Secondary | ICD-10-CM | POA: Diagnosis not present

## 2016-06-26 DIAGNOSIS — I7025 Atherosclerosis of native arteries of other extremities with ulceration: Secondary | ICD-10-CM

## 2016-06-26 DIAGNOSIS — N186 End stage renal disease: Secondary | ICD-10-CM | POA: Diagnosis not present

## 2016-06-26 LAB — POCT INR: INR: 2.7

## 2016-07-24 ENCOUNTER — Ambulatory Visit (INDEPENDENT_AMBULATORY_CARE_PROVIDER_SITE_OTHER): Payer: Medicare Other | Admitting: *Deleted

## 2016-07-24 DIAGNOSIS — I4891 Unspecified atrial fibrillation: Secondary | ICD-10-CM

## 2016-07-24 DIAGNOSIS — I7025 Atherosclerosis of native arteries of other extremities with ulceration: Secondary | ICD-10-CM

## 2016-07-24 DIAGNOSIS — N186 End stage renal disease: Secondary | ICD-10-CM

## 2016-07-24 DIAGNOSIS — E118 Type 2 diabetes mellitus with unspecified complications: Secondary | ICD-10-CM | POA: Diagnosis not present

## 2016-07-24 LAB — POCT INR: INR: 1.8

## 2016-07-25 ENCOUNTER — Encounter: Payer: Self-pay | Admitting: *Deleted

## 2016-07-25 ENCOUNTER — Telehealth: Payer: Self-pay | Admitting: *Deleted

## 2016-07-25 NOTE — Telephone Encounter (Signed)
-----   Message from Arnoldo Lenis, MD sent at 07/25/2016 11:58 AM EDT ----- Ok to stop ASA  Zandra Abts MD ----- Message ----- From: Malen Gauze, RN Sent: 07/24/2016   3:01 PM To: Arnoldo Lenis, MD  Pt wants to know if she should stop ASA 81mg ?

## 2016-07-25 NOTE — Telephone Encounter (Signed)
Husband told and he verbalized understanding.

## 2016-08-14 ENCOUNTER — Ambulatory Visit (INDEPENDENT_AMBULATORY_CARE_PROVIDER_SITE_OTHER): Payer: Medicare Other | Admitting: *Deleted

## 2016-08-14 ENCOUNTER — Encounter: Payer: Self-pay | Admitting: Cardiology

## 2016-08-14 ENCOUNTER — Ambulatory Visit (INDEPENDENT_AMBULATORY_CARE_PROVIDER_SITE_OTHER): Payer: Medicare Other | Admitting: Cardiology

## 2016-08-14 VITALS — BP 178/62 | HR 68 | Ht 67.0 in | Wt 123.0 lb

## 2016-08-14 DIAGNOSIS — N186 End stage renal disease: Secondary | ICD-10-CM

## 2016-08-14 DIAGNOSIS — I4891 Unspecified atrial fibrillation: Secondary | ICD-10-CM

## 2016-08-14 DIAGNOSIS — E118 Type 2 diabetes mellitus with unspecified complications: Secondary | ICD-10-CM | POA: Diagnosis not present

## 2016-08-14 DIAGNOSIS — I480 Paroxysmal atrial fibrillation: Secondary | ICD-10-CM

## 2016-08-14 DIAGNOSIS — I7025 Atherosclerosis of native arteries of other extremities with ulceration: Secondary | ICD-10-CM

## 2016-08-14 DIAGNOSIS — I10 Essential (primary) hypertension: Secondary | ICD-10-CM

## 2016-08-14 LAB — POCT INR: INR: 3.5

## 2016-08-14 NOTE — Patient Instructions (Signed)

## 2016-08-14 NOTE — Progress Notes (Signed)
Clinical Summary Ms. Katona is a 71 y.o.female seen today for follow up of the following medical problems.   1. PAF - patient with recent admission to [facility]. Issues with tachycardia, notes mention episodes of SVT and afib - initial available EKG shows only narrow complex regular tachycardia consistent, SVT. However since last visit, we requested additional records and EKGs from [facility] which did show previous episodes of atrial fibrillation. She returns today to discuss anticoagulation.  - CHADS2Vasc is 4, she is on coumadin   - no recent palpitaitons - no bleeding troubles on coumadin  2. ESRD - compliant with HD.  - can have some low bp's in HD   3. HTN - compliant with meds - reports bp's at HD usually in 120s/60s   Past Medical History:  Diagnosis Date  . Anemia    takes Folic Acid;pt states she gets an injection every 2wks   . Depression    but doesn't take any meds for it  . Diabetes mellitus    takes Lantus and Novolog 70/30;fasting blood sugar 200;Type 2  . GERD (gastroesophageal reflux disease)    takes Omeprazole daily  . GI bleeding   . History of bladder infections    last one a month ago;saw Dr.Wrenn and cysto was done in office;was on antibiotics and completed 2wks ago  . History of blood transfusion    no blood transfusion  . Hyperlipidemia    takes Zocor daily  . Hypertension    takes Coreg daily  . Myocardial infarction 2010  . Pancreatitis   . Peripheral neuropathy ([place])   . UTI (lower urinary tract infection)      Allergies  Allergen Reactions  . Sulfa Antibiotics Nausea Only    nausea     Current Outpatient Prescriptions  Medication Sig Dispense Refill  . ACCU-CHEK AVIVA PLUS test strip     . aspirin EC 81 MG tablet Take by mouth.    Marland Kitchen atorvastatin (LIPITOR) 20 MG tablet     . B Complex-C-Folic Acid (RENA-VITE RX) 1 MG TABS     . B-D INS SYR ULTRAFINE 1CC/31G 31G X 5/16" 1 ML MISC     . calcium acetate (PHOSLO) 667 MG  capsule 2 (two) times daily.    . insulin glargine (LANTUS) 100 UNIT/ML injection Inject into the skin 2 (two) times daily. Sliding scale per patient    . lidocaine-prilocaine (EMLA) cream     . metoprolol tartrate (LOPRESSOR) 25 MG tablet Take 1.5 tablets (37.5 mg total) by mouth 2 (two) times daily. 180 tablet 3  . promethazine (PHENERGAN) 25 MG tablet Take 25 mg by mouth every 4 (four) hours as needed.  0  . vitamin C (ASCORBIC ACID) 500 MG tablet Take 500 mg by mouth daily.    Marland Kitchen warfarin (COUMADIN) 2.5 MG tablet Take 1 tablet (2.5 mg total) by mouth as directed. 1 tablet daily except 2 tablets on Mondays, Wednesdays and Fridays 90 tablet 3   No current facility-administered medications for this visit.      Past Surgical History:  Procedure Laterality Date  . ABDOMINAL HYSTERECTOMY  1976  . APPENDECTOMY    . BASCILIC VEIN TRANSPOSITION Right 09/11/2012   Procedure: BASCILIC VEIN TRANSPOSITION- RIGHT ARM;  Surgeon: Mal Misty, MD;  Location: [place];  Service: Vascular;  Laterality: Right;  . bilateral laser surgery    . CARDIAC CATHETERIZATION  2010  . CATARACT EXTRACTION W/PHACO  03/01/2011   Procedure: CATARACT EXTRACTION PHACO  AND INTRAOCULAR LENS PLACEMENT (IOC);  Surgeon: Tonny Miaya Lafontant;  Location: [facility];  Service: Ophthalmology;  Laterality: Right;  CDE: 15.83  . CHOLECYSTECTOMY  2010  . DILATION AND CURETTAGE OF UTERUS  1965  . ESOPHAGOGASTRODUODENOSCOPY    . hemorrhage to bilateral eye    . ruptured ulcer in stomach  2010   had a feeding tube over a month  . SHUNTOGRAM N/A 12/02/2012   Procedure: Fistulagram;  Surgeon: Serafina Mitchell, MD;  Location: [facility] CATH LAB;  Service: Cardiovascular;  Laterality: N/A;     Allergies  Allergen Reactions  . Sulfa Antibiotics Nausea Only    nausea      Family History  Problem Relation Age of Onset  . Diabetes Mother   . Cancer Father   . Diabetes Daughter   . Heart attack Daughter   . Pneumonia Daughter   . Anesthesia problems  Neg Hx   . Hypotension Neg Hx   . Malignant hyperthermia Neg Hx   . Pseudochol deficiency Neg Hx      Social History Ms. Podgorski reports that she has never smoked. She has never used smokeless tobacco. Ms. Scorsone reports that she does not drink alcohol.   Review of Systems CONSTITUTIONAL: No weight loss, fever, chills, weakness or fatigue.  HEENT: Eyes: No visual loss, blurred vision, double vision or yellow sclerae.No hearing loss, sneezing, congestion, runny nose or sore throat.  SKIN: No rash or itching.  CARDIOVASCULAR: no chest pain, no palpitations.  RESPIRATORY: No shortness of breath, cough or sputum.  GASTROINTESTINAL: No anorexia, nausea, vomiting or diarrhea. No abdominal pain or blood.  GENITOURINARY: No burning on urination, no polyuria NEUROLOGICAL: No headache, dizziness, syncope, paralysis, ataxia, numbness or tingling in the extremities. No change in bowel or bladder control.  MUSCULOSKELETAL: No muscle, back pain, joint pain or stiffness.  LYMPHATICS: No enlarged nodes. No history of splenectomy.  PSYCHIATRIC: No history of depression or anxiety.  ENDOCRINOLOGIC: No reports of sweating, cold or heat intolerance. No polyuria or polydipsia.  Marland Kitchen   Physical Examination Vitals:   08/14/16 1345  BP: (!) 178/62  Pulse: 68   Vitals:   08/14/16 1345  Weight: 123 lb (55.8 kg)  Height: 5\' 7"  (1.702 m)    Gen: resting comfortably, no acute distress HEENT: no scleral icterus, pupils equal round and reactive, no palptable cervical adenopathy,  CV: ESP,2/3 systolic murmur at apex, no jvd Resp: Clear to auscultation bilaterally GI: abdomen is soft, non-tender, non-distended, normal bowel sounds, no hepatosplenomegaly MSK: extremities are warm, no edema.  Skin: warm, no rash Neuro:  no focal deficits Psych: appropriate affect      Assessment and Plan  1. PAF - CHADS2Vasc score is 4, continue coumadin - no recent palpitaitons  2. HTN - elevated in clinic, her  checks per her report at HD are at goal -continue to monitor  F/u 6 months      Arnoldo Lenis, M.D.

## 2016-08-15 ENCOUNTER — Ambulatory Visit: Payer: Medicare Other | Admitting: Cardiology

## 2016-09-04 ENCOUNTER — Ambulatory Visit (INDEPENDENT_AMBULATORY_CARE_PROVIDER_SITE_OTHER): Payer: Medicare Other | Admitting: *Deleted

## 2016-09-04 DIAGNOSIS — E118 Type 2 diabetes mellitus with unspecified complications: Secondary | ICD-10-CM | POA: Diagnosis not present

## 2016-09-04 DIAGNOSIS — N186 End stage renal disease: Secondary | ICD-10-CM | POA: Diagnosis not present

## 2016-09-04 DIAGNOSIS — I4891 Unspecified atrial fibrillation: Secondary | ICD-10-CM

## 2016-09-04 DIAGNOSIS — I7025 Atherosclerosis of native arteries of other extremities with ulceration: Secondary | ICD-10-CM

## 2016-09-04 LAB — POCT INR: INR: 5.9

## 2016-09-13 ENCOUNTER — Ambulatory Visit (INDEPENDENT_AMBULATORY_CARE_PROVIDER_SITE_OTHER): Payer: Medicare Other | Admitting: *Deleted

## 2016-09-13 DIAGNOSIS — N186 End stage renal disease: Secondary | ICD-10-CM

## 2016-09-13 DIAGNOSIS — I7025 Atherosclerosis of native arteries of other extremities with ulceration: Secondary | ICD-10-CM

## 2016-09-13 DIAGNOSIS — I4891 Unspecified atrial fibrillation: Secondary | ICD-10-CM

## 2016-09-13 DIAGNOSIS — E118 Type 2 diabetes mellitus with unspecified complications: Secondary | ICD-10-CM | POA: Diagnosis not present

## 2016-09-13 LAB — POCT INR: INR: 1.8

## 2016-09-18 ENCOUNTER — Other Ambulatory Visit: Payer: Self-pay | Admitting: Cardiology

## 2016-09-18 NOTE — Telephone Encounter (Signed)
Called patient to verify dose and directions for lopressor. Patient confirmed that she is taking 25 mg 1&1/2 tablets by mouth twice daily.

## 2016-10-04 ENCOUNTER — Ambulatory Visit (INDEPENDENT_AMBULATORY_CARE_PROVIDER_SITE_OTHER): Payer: Medicare Other | Admitting: *Deleted

## 2016-10-04 DIAGNOSIS — I4891 Unspecified atrial fibrillation: Secondary | ICD-10-CM | POA: Diagnosis not present

## 2016-10-04 DIAGNOSIS — N186 End stage renal disease: Secondary | ICD-10-CM

## 2016-10-04 DIAGNOSIS — I7025 Atherosclerosis of native arteries of other extremities with ulceration: Secondary | ICD-10-CM

## 2016-10-04 DIAGNOSIS — E118 Type 2 diabetes mellitus with unspecified complications: Secondary | ICD-10-CM | POA: Diagnosis not present

## 2016-10-04 LAB — POCT INR: INR: 2.1

## 2016-10-18 DIAGNOSIS — I471 Supraventricular tachycardia: Secondary | ICD-10-CM | POA: Diagnosis present

## 2016-10-18 DIAGNOSIS — E785 Hyperlipidemia, unspecified: Secondary | ICD-10-CM | POA: Diagnosis present

## 2016-10-31 ENCOUNTER — Other Ambulatory Visit: Payer: Self-pay

## 2016-10-31 DIAGNOSIS — T82510A Breakdown (mechanical) of surgically created arteriovenous fistula, initial encounter: Secondary | ICD-10-CM

## 2016-11-01 ENCOUNTER — Ambulatory Visit (INDEPENDENT_AMBULATORY_CARE_PROVIDER_SITE_OTHER): Payer: Medicare Other | Admitting: *Deleted

## 2016-11-01 DIAGNOSIS — E118 Type 2 diabetes mellitus with unspecified complications: Secondary | ICD-10-CM

## 2016-11-01 DIAGNOSIS — I4891 Unspecified atrial fibrillation: Secondary | ICD-10-CM | POA: Diagnosis not present

## 2016-11-01 DIAGNOSIS — N186 End stage renal disease: Secondary | ICD-10-CM | POA: Diagnosis not present

## 2016-11-01 DIAGNOSIS — I7025 Atherosclerosis of native arteries of other extremities with ulceration: Secondary | ICD-10-CM

## 2016-11-01 LAB — POCT INR: INR: 1.7

## 2016-11-22 ENCOUNTER — Ambulatory Visit (INDEPENDENT_AMBULATORY_CARE_PROVIDER_SITE_OTHER): Payer: Medicare Other | Admitting: *Deleted

## 2016-11-22 DIAGNOSIS — I7025 Atherosclerosis of native arteries of other extremities with ulceration: Secondary | ICD-10-CM | POA: Diagnosis not present

## 2016-11-22 DIAGNOSIS — N186 End stage renal disease: Secondary | ICD-10-CM

## 2016-11-22 DIAGNOSIS — E118 Type 2 diabetes mellitus with unspecified complications: Secondary | ICD-10-CM

## 2016-11-22 DIAGNOSIS — I4891 Unspecified atrial fibrillation: Secondary | ICD-10-CM

## 2016-11-22 LAB — POCT INR: INR: 2.3

## 2016-11-27 ENCOUNTER — Other Ambulatory Visit: Payer: Self-pay | Admitting: *Deleted

## 2016-11-27 ENCOUNTER — Encounter: Payer: Self-pay | Admitting: *Deleted

## 2016-11-27 ENCOUNTER — Ambulatory Visit (HOSPITAL_COMMUNITY)
Admission: RE | Admit: 2016-11-27 | Discharge: 2016-11-27 | Disposition: A | Discharge status: Home / Self Care | Payer: Medicare Other | Source: Ambulatory Visit | Attending: Family | Admitting: Family

## 2016-11-27 ENCOUNTER — Encounter: Payer: Self-pay | Admitting: Vascular Surgery

## 2016-11-27 ENCOUNTER — Ambulatory Visit (INDEPENDENT_AMBULATORY_CARE_PROVIDER_SITE_OTHER): Payer: Medicare Other | Admitting: Vascular Surgery

## 2016-11-27 VITALS — BP 171/60 | HR 57 | Temp 97.0°F | Resp 16 | Ht 64.0 in | Wt 125.8 lb

## 2016-11-27 DIAGNOSIS — I878 Other specified disorders of veins: Secondary | ICD-10-CM | POA: Diagnosis not present

## 2016-11-27 DIAGNOSIS — T82510A Breakdown (mechanical) of surgically created arteriovenous fistula, initial encounter: Secondary | ICD-10-CM | POA: Insufficient documentation

## 2016-11-27 DIAGNOSIS — N186 End stage renal disease: Secondary | ICD-10-CM | POA: Diagnosis not present

## 2016-11-27 NOTE — Progress Notes (Signed)
[facility] [place]  Patient name: Vanessa Webb MRN: [id number] DOB: February 29, 1945 Sex: female  REASON FOR CONSULT: Long bleeding and high arterial pressures and diminished clearance from it arm AV fistula  HPI: Vanessa Webb is a 71 y.o. female, who is here for evaluation of her right arm basilic vein transposition AV fistula. This was initially done in July 2014. She's had one episode of fistulogram since that time showing no evidence of central issues. Her shuntogram was in October 2014. Since that time she's had excellent use of her fistula. Recently she has been having several episodes of prolonged bleeding and according to the family has had episodes of diminished clearance and according to the dialysis center high arterial pressures. She is seen today for further evaluation. She is on chronic Coumadin therapy. Since for atrial fibrillation.  Past Medical History:  Diagnosis Date  . Anemia    takes Folic Acid;pt states she gets an injection every 2wks   . Depression    but doesn't take any meds for it  . Diabetes mellitus    takes Lantus and Novolog 70/30;fasting blood sugar 200;Type 2  . GERD (gastroesophageal reflux disease)    takes Omeprazole daily  . GI bleeding   . History of bladder infections    last one a month ago;saw Dr.Wrenn and cysto was done in office;was on antibiotics and completed 2wks ago  . History of blood transfusion    no blood transfusion  . Hyperlipidemia    takes Zocor daily  . Hypertension    takes Coreg daily  . Myocardial infarction ([place]) 2010  . Pancreatitis   . Peripheral neuropathy   . UTI (lower urinary tract infection)     Family History  Problem Relation Age of Onset  . Diabetes Mother   . Cancer Father   . Diabetes Daughter   . Heart attack Daughter   . Pneumonia Daughter   . Anesthesia problems Neg Hx   . Hypotension Neg Hx   . Malignant hyperthermia Neg Hx   . Pseudochol  deficiency Neg Hx     SOCIAL HISTORY: Social History   Social History  . Marital status: Married    Spouse name: N/A  . Number of children: N/A  . Years of education: N/A   Occupational History  . Not on file.   Social History Main Topics  . Smoking status: Never Smoker  . Smokeless tobacco: Never Used  . Alcohol use No  . Drug use: No  . Sexual activity: Yes    Birth control/ protection: Surgical   Other Topics Concern  . Not on file   Social History Narrative  . No narrative on file    Allergies  Allergen Reactions  . Sulfa Antibiotics Nausea Only    nausea    Current Outpatient Prescriptions  Medication Sig Dispense Refill  . ACCU-CHEK AVIVA PLUS test strip     . B-D INS SYR ULTRAFINE 1CC/31G 31G X 5/16" 1 ML MISC     . insulin glargine (LANTUS) 100 UNIT/ML injection Inject into the skin 2 (two) times daily. Sliding scale per patient    . lidocaine-prilocaine (EMLA) cream     . metoprolol tartrate (LOPRESSOR) 25 MG tablet TAKE 1 AND 1/2 TABLETS BY MOUTH TWICE DAILY. (MORNING,BEDTIME) 270 tablet 3  . vitamin C (ASCORBIC ACID) 500 MG tablet Take 500 mg by mouth daily.    Marland Kitchen warfarin (COUMADIN) 2.5 MG tablet Take 1 tablet (2.5 mg  total) by mouth as directed. 1 tablet daily except 2 tablets on Mondays, Wednesdays and Fridays 90 tablet 3   No current facility-administered medications for this visit.     REVIEW OF SYSTEMS:  [X]  denotes positive finding, [ ]  denotes negative finding Cardiac  Comments:  Chest pain or chest pressure:    Shortness of breath upon exertion:    Short of breath when lying flat:    Irregular heart rhythm:        Vascular    Pain in calf, thigh, or hip brought on by ambulation:    Pain in feet at night that wakes you up from your sleep:     Blood clot in your veins:    Leg swelling:         Pulmonary    Oxygen at home:    Productive cough:     Wheezing:         Neurologic    Sudden weakness in arms or legs:     Sudden numbness  in arms or legs:     Sudden onset of difficulty speaking or slurred speech:    Temporary loss of vision in one eye:     Problems with dizziness:         Gastrointestinal    Blood in stool:     Vomited blood:         Genitourinary    Burning when urinating:     Blood in urine:        Psychiatric    Major depression:         Hematologic    Bleeding problems:    Problems with blood clotting too easily:        Skin    Rashes or ulcers:        Constitutional    Fever or chills:      PHYSICAL EXAM: Vitals:   11/27/16 0853  BP: (!) 171/60  Pulse: (!) 57  Resp: 16  Temp: (!) 97 F (36.1 C)  TempSrc: Oral  SpO2: 100%  Weight: 125 lb 12.8 oz (57.1 kg)  Height: 5\' 4"  (1.626 m)    GENERAL: The patient is a well-nourished female, in no acute distress. The vital signs are documented above. CARDIOVASCULAR: Well-developed right upper arm AV fistula with no evidence of skin breakdown. It appears the dialysis has been doing an excellent job of rotation of needle sites. There is somewhat of a pulsatile nature of her fistula. PULMONARY: There is good air exchange  ABDOMEN: Soft and non-tender  MUSCULOSKELETAL: There are no major deformities or cyanosis. NEUROLOGIC: No focal weakness or paresthesias are detected. SKIN: There are no ulcers or rashes noted. PSYCHIATRIC: The patient has a normal affect.  DATA:  Duplex today reveals no evidence of stenosis in the vein graft itself she does have normal velocities through the vein graft.  MEDICAL ISSUES: Discussed this at length with the patient and her husband present. Due to poor clearance and elevated arterial pressures and prolonged bleeding I am concerned that she may have a central venous stenosis. We will schedule her for outpatient shuntogram on a nondialysis day at her earliest convenience. She undergo knows that she will undergo angioplasty and appropriate treatment at the same setting.   Rosetta Posner, MD FACS [facility] Tel [phone] Pager [phone]

## 2016-12-03 ENCOUNTER — Encounter (HOSPITAL_COMMUNITY): Payer: Self-pay | Admitting: Vascular Surgery

## 2016-12-03 ENCOUNTER — Encounter (HOSPITAL_COMMUNITY)
Admission: RE | Disposition: A | Discharge status: Home / Self Care | Payer: Self-pay | Source: Ambulatory Visit | Attending: Vascular Surgery

## 2016-12-03 ENCOUNTER — Ambulatory Visit (HOSPITAL_COMMUNITY)
Admission: RE | Admit: 2016-12-03 | Discharge: 2016-12-03 | Disposition: A | Discharge status: Home / Self Care | Payer: Medicare Other | Source: Ambulatory Visit | Attending: Vascular Surgery | Admitting: Vascular Surgery

## 2016-12-03 DIAGNOSIS — Z79899 Other long term (current) drug therapy: Secondary | ICD-10-CM | POA: Diagnosis not present

## 2016-12-03 DIAGNOSIS — E785 Hyperlipidemia, unspecified: Secondary | ICD-10-CM | POA: Insufficient documentation

## 2016-12-03 DIAGNOSIS — Z882 Allergy status to sulfonamides status: Secondary | ICD-10-CM | POA: Insufficient documentation

## 2016-12-03 DIAGNOSIS — E1142 Type 2 diabetes mellitus with diabetic polyneuropathy: Secondary | ICD-10-CM | POA: Diagnosis not present

## 2016-12-03 DIAGNOSIS — F329 Major depressive disorder, single episode, unspecified: Secondary | ICD-10-CM | POA: Insufficient documentation

## 2016-12-03 DIAGNOSIS — I252 Old myocardial infarction: Secondary | ICD-10-CM | POA: Insufficient documentation

## 2016-12-03 DIAGNOSIS — Z7901 Long term (current) use of anticoagulants: Secondary | ICD-10-CM | POA: Insufficient documentation

## 2016-12-03 DIAGNOSIS — Y832 Surgical operation with anastomosis, bypass or graft as the cause of abnormal reaction of the patient, or of later complication, without mention of misadventure at the time of the procedure: Secondary | ICD-10-CM | POA: Insufficient documentation

## 2016-12-03 DIAGNOSIS — T82858A Stenosis of vascular prosthetic devices, implants and grafts, initial encounter: Secondary | ICD-10-CM | POA: Diagnosis not present

## 2016-12-03 DIAGNOSIS — K219 Gastro-esophageal reflux disease without esophagitis: Secondary | ICD-10-CM | POA: Diagnosis not present

## 2016-12-03 DIAGNOSIS — I10 Essential (primary) hypertension: Secondary | ICD-10-CM | POA: Diagnosis not present

## 2016-12-03 DIAGNOSIS — I4891 Unspecified atrial fibrillation: Secondary | ICD-10-CM | POA: Insufficient documentation

## 2016-12-03 DIAGNOSIS — T82838A Hemorrhage of vascular prosthetic devices, implants and grafts, initial encounter: Secondary | ICD-10-CM | POA: Insufficient documentation

## 2016-12-03 DIAGNOSIS — Y9289 Other specified places as the place of occurrence of the external cause: Secondary | ICD-10-CM | POA: Diagnosis not present

## 2016-12-03 DIAGNOSIS — Z794 Long term (current) use of insulin: Secondary | ICD-10-CM | POA: Diagnosis not present

## 2016-12-03 HISTORY — PX: A/V FISTULAGRAM: CATH118298

## 2016-12-03 HISTORY — PX: PERIPHERAL VASCULAR BALLOON ANGIOPLASTY: CATH118281

## 2016-12-03 LAB — POCT I-STAT, CHEM 8
BUN: 43 mg/dL — ABNORMAL HIGH (ref 6–20)
CALCIUM ION: 1.01 mmol/L — AB (ref 1.15–1.40)
CREATININE: 5.9 mg/dL — AB (ref 0.44–1.00)
Chloride: 98 mmol/L — ABNORMAL LOW (ref 101–111)
GLUCOSE: 98 mg/dL (ref 65–99)
HCT: 30 % — ABNORMAL LOW (ref 36.0–46.0)
HEMOGLOBIN: 10.2 g/dL — AB (ref 12.0–15.0)
Potassium: 4 mmol/L (ref 3.5–5.1)
Sodium: 136 mmol/L (ref 135–145)
TCO2: 27 mmol/L (ref 22–32)

## 2016-12-03 LAB — GLUCOSE, CAPILLARY: Glucose-Capillary: 98 mg/dL (ref 65–99)

## 2016-12-03 LAB — PROTIME-INR
INR: 1.24
PROTHROMBIN TIME: 15.5 s — AB (ref 11.4–15.2)

## 2016-12-03 SURGERY — A/V FISTULAGRAM
Anesthesia: LOCAL | Laterality: Right

## 2016-12-03 MED ORDER — FENTANYL CITRATE (PF) 100 MCG/2ML IJ SOLN
INTRAMUSCULAR | Status: DC | PRN
Start: 2016-12-03 — End: 2016-12-03
  Administered 2016-12-03: 50 ug via INTRAVENOUS

## 2016-12-03 MED ORDER — HEPARIN SODIUM (PORCINE) 1000 UNIT/ML IJ SOLN
INTRAMUSCULAR | Status: AC
Start: 2016-12-03 — End: 2016-12-03
  Filled 2016-12-03: qty 1

## 2016-12-03 MED ORDER — IODIXANOL 320 MG/ML IV SOLN
INTRAVENOUS | Status: DC | PRN
  Administered 2016-12-03: 22 mL via INTRAVENOUS

## 2016-12-03 MED ORDER — HEPARIN (PORCINE) IN NACL 2-0.9 UNIT/ML-% IJ SOLN
INTRAMUSCULAR | Status: AC
  Filled 2016-12-03: qty 500

## 2016-12-03 MED ORDER — SODIUM CHLORIDE 0.9% FLUSH: 3.0000 mL | INTRAVENOUS | Status: DC | PRN

## 2016-12-03 MED ORDER — MIDAZOLAM HCL 2 MG/2ML IJ SOLN
INTRAMUSCULAR | Status: DC | PRN
  Administered 2016-12-03: 1 mg via INTRAVENOUS

## 2016-12-03 MED ORDER — MIDAZOLAM HCL 2 MG/2ML IJ SOLN
INTRAMUSCULAR | Status: AC
  Filled 2016-12-03: qty 2

## 2016-12-03 MED ORDER — HEPARIN SODIUM (PORCINE) 1000 UNIT/ML IJ SOLN
INTRAMUSCULAR | Status: DC | PRN
  Administered 2016-12-03: 4000 [IU] via INTRAVENOUS

## 2016-12-03 MED ORDER — HEPARIN (PORCINE) IN NACL 2-0.9 UNIT/ML-% IJ SOLN
INTRAMUSCULAR | Status: AC | PRN
  Administered 2016-12-03: 500 mL

## 2016-12-03 MED ORDER — LIDOCAINE HCL 2 % IJ SOLN
INTRAMUSCULAR | Status: AC
  Filled 2016-12-03: qty 10

## 2016-12-03 MED ORDER — LIDOCAINE HCL (PF) 1 % IJ SOLN
INTRAMUSCULAR | Status: DC | PRN
  Administered 2016-12-03: 2 mL

## 2016-12-03 MED ORDER — FENTANYL CITRATE (PF) 100 MCG/2ML IJ SOLN
INTRAMUSCULAR | Status: AC
Start: 2016-12-03 — End: 2016-12-03
  Filled 2016-12-03: qty 2

## 2016-12-03 SURGICAL SUPPLY — 19 items
BAG SNAP BAND KOVER 36X36 (MISCELLANEOUS) ×2 IMPLANT
BALLN MUSTANG 10.0X40 75 (BALLOONS) ×2
BALLN MUSTANG 8.0X40 135 (BALLOONS) ×2
BALLOON MUSTANG 10.0X40 75 (BALLOONS) ×1 IMPLANT
BALLOON MUSTANG 8.0X40 135 (BALLOONS) ×1 IMPLANT
COVER DOME SNAP 22 D (MISCELLANEOUS) ×2 IMPLANT
COVER PRB 48X5XTLSCP FOLD TPE (BAG) ×1 IMPLANT
COVER PROBE 5X48 (BAG) ×2
KIT ENCORE 26 ADVANTAGE (KITS) ×2 IMPLANT
KIT MICROINTRODUCER STIFF 5F (SHEATH) ×2 IMPLANT
PROTECTION STATION PRESSURIZED (MISCELLANEOUS) ×2
SHEATH PINNACLE R/O II 6F 4CM (SHEATH) ×2 IMPLANT
STATION PROTECTION PRESSURIZED (MISCELLANEOUS) ×1 IMPLANT
STOPCOCK MORSE 400PSI 3WAY (MISCELLANEOUS) ×2 IMPLANT
TRAY PV CATH (CUSTOM PROCEDURE TRAY) ×2 IMPLANT
TUBING CIL FLEX 10 FLL-RA (TUBING) ×2 IMPLANT
WIRE BENTSON .035X145CM (WIRE) ×2 IMPLANT
WIRE ROSEN-J .035X260CM (WIRE) ×2 IMPLANT
WIRE TORQFLEX AUST .018X40CM (WIRE) ×2 IMPLANT

## 2016-12-03 NOTE — Interval H&P Note (Signed)
History and Physical Interval Note:  12/03/2016 9:33 AM  Vanessa Webb  has presented today for surgery, with the diagnosis of instage renal  The various methods of treatment have been discussed with the patient and family. After consideration of risks, benefits and other options for treatment, the patient has consented to  Procedure(s): A/V Fistulagram - Right Arm (Right) as a surgical intervention .  The patient's history has been reviewed, patient examined, no change in status, stable for surgery.  I have reviewed the patient's chart and labs.  Questions were answered to the patient's satisfaction.     [name]

## 2016-12-03 NOTE — Op Note (Signed)
   PATIENT: Vanessa Webb      MRN: [id number] DOB: 01-03-1946    DATE OF PROCEDURE: 12/03/2016  INDICATIONS:    Vanessa Webb is a 70 y.o. female Prolonged bleeding from AV fistula after hemodialysis.  PROCEDURE:    1. Ultrasound-guided access to right brachiocephalic AV fistula 2. Fistulogram right brachiocephalic AV fistula 3. Venoplasty of stenosis of right brachiocephalic AV fistula 4. Conscious sedation  SURGEON: Judeth Cornfield. Scot Dock, MD, FACS  ANESTHESIA: Local with sedation   EBL: Minimal  TECHNIQUE: The patient was brought to the peripheral vascular lab and was sedated. The period of conscious sedation was 16 minutes.  During that time period, I was present face-to-face 100% of the time.  The patient was administered 1 mg of Versed and 50 g of fentanyl. The patient's heart rate, blood pressure, and oxygen saturation were monitored by the nurse continuously during the procedure.  The right arm was prepped and draped in usual sterile fashion. Under ultrasound guidance, after the skin was anesthetized, the right brachiocephalic fistula was cannulated with a micropuncture needle. A micro-puncture introduced was placed over the wire. A fistulogram was obtained to evaluate the fistula from the point of cannulation to include the central veins. There was a stenosis in the central portion of the fistula which I elected to address with balloon angioplasty.  The micropuncture sheath was exchanged for a short 6 [place] sheath over a [employer] wire. The patient was then heparinized. I initially selected an 8 mm x 4 cm balloon which was inflated to 20 atm for 1 minute. While the balloon was inflated, a retrograde shot was obtained to a weight the proximal fistula and arterial anastomosis. Completion films showed some residual stenosis. I then went back with a 10 mm x 4 cm balloon which was inflated to 16 atm for 1 minute. Completion film showed only mild residual stenosis. The fistula had an  excellent thrill at this point and was not pulsatile.  The cannulation site was closed with a 4-0 Monocryl. Hemostasis was obtained. The patient tolerated the procedure well and no immediate complications were noted.   FINDINGS:   1. No central venous stenosis 2. 80% stenosis of fistula just below the shoulder. This was successfully ballooned as described above with 15% residual stenosis. 3. The arterial anastomosis was patent.  [name], MD, FACS [facility]  DATE OF DICTATION:   12/03/2016

## 2016-12-03 NOTE — Discharge Instructions (Signed)

## 2016-12-03 NOTE — H&P (View-Only) (Signed)
[facility] [place]  Patient name: Vanessa Webb MRN: [id number] DOB: Dec 06, 1945 Sex: female  REASON FOR CONSULT: Long bleeding and high arterial pressures and diminished clearance from it arm AV fistula  HPI: Vanessa Webb is a 71 y.o. female, who is here for evaluation of her right arm basilic vein transposition AV fistula. This was initially done in July 2014. She's had one episode of fistulogram since that time showing no evidence of central issues. Her shuntogram was in October 2014. Since that time she's had excellent use of her fistula. Recently she has been having several episodes of prolonged bleeding and according to the family has had episodes of diminished clearance and according to the dialysis center high arterial pressures. She is seen today for further evaluation. She is on chronic Coumadin therapy. Since for atrial fibrillation.  Past Medical History:  Diagnosis Date  . Anemia    takes Folic Acid;pt states she gets an injection every 2wks   . Depression    but doesn't take any meds for it  . Diabetes mellitus    takes Lantus and Novolog 70/30;fasting blood sugar 200;Type 2  . GERD (gastroesophageal reflux disease)    takes Omeprazole daily  . GI bleeding   . History of bladder infections    last one a month ago;saw Dr.Wrenn and cysto was done in office;was on antibiotics and completed 2wks ago  . History of blood transfusion    no blood transfusion  . Hyperlipidemia    takes Zocor daily  . Hypertension    takes Coreg daily  . Myocardial infarction ([place]) 2010  . Pancreatitis   . Peripheral neuropathy   . UTI (lower urinary tract infection)     Family History  Problem Relation Age of Onset  . Diabetes Mother   . Cancer Father   . Diabetes Daughter   . Heart attack Daughter   . Pneumonia Daughter   . Anesthesia problems Neg Hx   . Hypotension Neg Hx   . Malignant hyperthermia Neg Hx   . Pseudochol  deficiency Neg Hx     SOCIAL HISTORY: Social History   Social History  . Marital status: Married    Spouse name: N/A  . Number of children: N/A  . Years of education: N/A   Occupational History  . Not on file.   Social History Main Topics  . Smoking status: Never Smoker  . Smokeless tobacco: Never Used  . Alcohol use No  . Drug use: No  . Sexual activity: Yes    Birth control/ protection: Surgical   Other Topics Concern  . Not on file   Social History Narrative  . No narrative on file    Allergies  Allergen Reactions  . Sulfa Antibiotics Nausea Only    nausea    Current Outpatient Prescriptions  Medication Sig Dispense Refill  . ACCU-CHEK AVIVA PLUS test strip     . B-D INS SYR ULTRAFINE 1CC/31G 31G X 5/16" 1 ML MISC     . insulin glargine (LANTUS) 100 UNIT/ML injection Inject into the skin 2 (two) times daily. Sliding scale per patient    . lidocaine-prilocaine (EMLA) cream     . metoprolol tartrate (LOPRESSOR) 25 MG tablet TAKE 1 AND 1/2 TABLETS BY MOUTH TWICE DAILY. (MORNING,BEDTIME) 270 tablet 3  . vitamin C (ASCORBIC ACID) 500 MG tablet Take 500 mg by mouth daily.    Marland Kitchen warfarin (COUMADIN) 2.5 MG tablet Take 1 tablet (2.5 mg  total) by mouth as directed. 1 tablet daily except 2 tablets on Mondays, Wednesdays and Fridays 90 tablet 3   No current facility-administered medications for this visit.     REVIEW OF SYSTEMS:  [X]  denotes positive finding, [ ]  denotes negative finding Cardiac  Comments:  Chest pain or chest pressure:    Shortness of breath upon exertion:    Short of breath when lying flat:    Irregular heart rhythm:        Vascular    Pain in calf, thigh, or hip brought on by ambulation:    Pain in feet at night that wakes you up from your sleep:     Blood clot in your veins:    Leg swelling:         Pulmonary    Oxygen at home:    Productive cough:     Wheezing:         Neurologic    Sudden weakness in arms or legs:     Sudden numbness  in arms or legs:     Sudden onset of difficulty speaking or slurred speech:    Temporary loss of vision in one eye:     Problems with dizziness:         Gastrointestinal    Blood in stool:     Vomited blood:         Genitourinary    Burning when urinating:     Blood in urine:        Psychiatric    Major depression:         Hematologic    Bleeding problems:    Problems with blood clotting too easily:        Skin    Rashes or ulcers:        Constitutional    Fever or chills:      PHYSICAL EXAM: Vitals:   11/27/16 0853  BP: (!) 171/60  Pulse: (!) 57  Resp: 16  Temp: (!) 97 F (36.1 C)  TempSrc: Oral  SpO2: 100%  Weight: 125 lb 12.8 oz (57.1 kg)  Height: 5\' 4"  (1.626 m)    GENERAL: The patient is a well-nourished female, in no acute distress. The vital signs are documented above. CARDIOVASCULAR: Well-developed right upper arm AV fistula with no evidence of skin breakdown. It appears the dialysis has been doing an excellent job of rotation of needle sites. There is somewhat of a pulsatile nature of her fistula. PULMONARY: There is good air exchange  ABDOMEN: Soft and non-tender  MUSCULOSKELETAL: There are no major deformities or cyanosis. NEUROLOGIC: No focal weakness or paresthesias are detected. SKIN: There are no ulcers or rashes noted. PSYCHIATRIC: The patient has a normal affect.  DATA:  Duplex today reveals no evidence of stenosis in the vein graft itself she does have normal velocities through the vein graft.  MEDICAL ISSUES: Discussed this at length with the patient and her husband present. Due to poor clearance and elevated arterial pressures and prolonged bleeding I am concerned that she may have a central venous stenosis. We will schedule her for outpatient shuntogram on a nondialysis day at her earliest convenience. She undergo knows that she will undergo angioplasty and appropriate treatment at the same setting.   Rosetta Posner, MD FACS [facility] Tel [phone] Pager [phone]

## 2016-12-04 MED FILL — Lidocaine HCl Local Inj 2%: INTRAMUSCULAR | Qty: 10 | Status: AC

## 2016-12-07 ENCOUNTER — Other Ambulatory Visit: Payer: Self-pay | Admitting: Cardiology

## 2016-12-20 ENCOUNTER — Ambulatory Visit (INDEPENDENT_AMBULATORY_CARE_PROVIDER_SITE_OTHER): Payer: Medicare Other | Admitting: *Deleted

## 2016-12-20 DIAGNOSIS — I4891 Unspecified atrial fibrillation: Secondary | ICD-10-CM

## 2016-12-20 DIAGNOSIS — N186 End stage renal disease: Secondary | ICD-10-CM | POA: Diagnosis not present

## 2016-12-20 DIAGNOSIS — I7025 Atherosclerosis of native arteries of other extremities with ulceration: Secondary | ICD-10-CM

## 2016-12-20 DIAGNOSIS — E118 Type 2 diabetes mellitus with unspecified complications: Secondary | ICD-10-CM

## 2016-12-20 DIAGNOSIS — Z5181 Encounter for therapeutic drug level monitoring: Secondary | ICD-10-CM

## 2016-12-20 LAB — POCT INR: INR: 2.2

## 2017-01-24 ENCOUNTER — Ambulatory Visit (INDEPENDENT_AMBULATORY_CARE_PROVIDER_SITE_OTHER): Payer: Medicare Other | Admitting: *Deleted

## 2017-01-24 DIAGNOSIS — N186 End stage renal disease: Secondary | ICD-10-CM

## 2017-01-24 DIAGNOSIS — I4891 Unspecified atrial fibrillation: Secondary | ICD-10-CM

## 2017-01-24 DIAGNOSIS — E118 Type 2 diabetes mellitus with unspecified complications: Secondary | ICD-10-CM

## 2017-01-24 DIAGNOSIS — I7025 Atherosclerosis of native arteries of other extremities with ulceration: Secondary | ICD-10-CM

## 2017-01-24 LAB — POCT INR: INR: 2.2

## 2017-01-24 MED ORDER — WARFARIN SODIUM 2.5 MG PO TABS: ORAL_TABLET | ORAL | 6 refills | Status: DC

## 2017-01-28 ENCOUNTER — Encounter: Payer: Self-pay | Admitting: *Deleted

## 2017-01-28 ENCOUNTER — Other Ambulatory Visit: Payer: Self-pay

## 2017-01-28 ENCOUNTER — Encounter: Payer: Self-pay | Admitting: Cardiology

## 2017-01-28 ENCOUNTER — Ambulatory Visit: Payer: Medicare Other | Admitting: Cardiology

## 2017-01-28 VITALS — BP 180/70 | HR 65 | Ht 64.0 in | Wt 128.0 lb

## 2017-01-28 DIAGNOSIS — I10 Essential (primary) hypertension: Secondary | ICD-10-CM | POA: Diagnosis not present

## 2017-01-28 DIAGNOSIS — I480 Paroxysmal atrial fibrillation: Secondary | ICD-10-CM

## 2017-01-28 MED ORDER — AMLODIPINE BESYLATE 2.5 MG PO TABS: ORAL_TABLET | ORAL | 3 refills | Status: DC

## 2017-01-28 NOTE — Patient Instructions (Signed)
Medication Instructions:   Begin Norvasc 2.5mg  daily - to be taken on non dialysis days (Monday, Wednesday, Friday, & Sunday.    Continue all other medications.    Labwork: none  Testing/Procedures: none  Follow-Up: Your physician wants you to follow up in: 6 months.  You will receive a reminder letter in the mail one-two months in advance.  If you don't receive a letter, please call our office to schedule the follow up appointment   Any Other Special Instructions Will Be Listed Below (If Applicable).  If you need a refill on your cardiac medications before your next appointment, please call your pharmacy.

## 2017-01-28 NOTE — Progress Notes (Signed)
Clinical Summary Vanessa Webb is a 71 y.o.female seen today for follow up of the following medical problems.   1. PAF - patient with recent admission to [facility]. Issues with tachycardia, notes mention episodes of SVT and afib - initial available EKG shows only narrow complex regular tachycardia consistent, SVT. However since last visit, we requested additional records and EKGs from [place]  did show previous episodes of atrial fibrillation.  - CHADS2Vasc is 4, she is on coumadin   - 1-2 episodes of palpitations since last visit, lasts about 1 minute.  - no recent bleeding on coumadin  2. ESRD - compliant with HD T,Th,Sat - can have some low bp's in HD   3. HTN - compliant with meds - can have low bps during HD  Past Medical History:  Diagnosis Date  . Anemia    takes Folic Acid;pt states she gets an injection every 2wks   . Depression    but doesn't take any meds for it  . Diabetes mellitus    takes Lantus and Novolog 70/30;fasting blood sugar 200;Type 2  . GERD (gastroesophageal reflux disease)    takes Omeprazole daily  . GI bleeding   . History of bladder infections    last one a month ago;saw Dr.Wrenn and cysto was done in office;was on antibiotics and completed 2wks ago  . History of blood transfusion    no blood transfusion  . Hyperlipidemia    takes Zocor daily  . Hypertension    takes Coreg daily  . Myocardial infarction ([place]) 2010  . Pancreatitis   . Peripheral neuropathy   . UTI (lower urinary tract infection)      Allergies  Allergen Reactions  . Sulfa Antibiotics Nausea Only    nausea     Current Outpatient Medications  Medication Sig Dispense Refill  . ACCU-CHEK AVIVA PLUS test strip     . acetaminophen (TYLENOL) 500 MG tablet Take 500 mg by mouth every 6 (six) hours as needed (for pain.).    Marland Kitchen atorvastatin (LIPITOR) 20 MG tablet Take 20 mg by mouth at bedtime.    . B Complex-C-Folic Acid (VP-VITE RX) 1 MG TABS Take 1 tablet by mouth  at bedtime.    . B-D INS SYR ULTRAFINE 1CC/31G 31G X 5/16" 1 ML MISC     . insulin glargine (LANTUS) 100 UNIT/ML injection Inject 3-15 Units into the skin at bedtime. Sliding scale per patient    . Insulin Lispro Prot & Lispro (HUMALOG MIX 75/25 KWIKPEN [place]) Inject 0-18 Units into the skin daily before supper. Sliding scale insulin    . lidocaine-prilocaine (EMLA) cream Apply 1 application topically daily as needed (prior to port being accessed).     . metoprolol tartrate (LOPRESSOR) 25 MG tablet TAKE 1 AND 1/2 TABLETS BY MOUTH TWICE DAILY. (MORNING,BEDTIME) 270 tablet 3  . warfarin (COUMADIN) 2.5 MG tablet Take coumadin 2 tablets daily except 1 tablet on Tuesdays and Saturdays 60 tablet 6   No current facility-administered medications for this visit.      Past Surgical History:  Procedure Laterality Date  . A/V FISTULAGRAM Right 12/03/2016   Procedure: A/V Fistulagram - Right Arm;  Surgeon: Angelia Mould, MD;  Location: [place] [facility];  Service: Cardiovascular;  Laterality: Right;  . ABDOMINAL HYSTERECTOMY  1976  . APPENDECTOMY    . BASCILIC VEIN TRANSPOSITION Right 09/11/2012   Procedure: BASCILIC VEIN TRANSPOSITION- RIGHT ARM;  Surgeon: Mal Misty, MD;  Location: [place];  Service: Vascular;  Laterality: Right;  . bilateral laser surgery    . CARDIAC CATHETERIZATION  2010  . CATARACT EXTRACTION W/PHACO  03/01/2011   Procedure: CATARACT EXTRACTION PHACO AND INTRAOCULAR LENS PLACEMENT (IOC);  Surgeon: Tonny Branch;  Location: [facility];  Service: Ophthalmology;  Laterality: Right;  CDE: 15.83  . CHOLECYSTECTOMY  2010  . DILATION AND CURETTAGE OF UTERUS  1965  . ESOPHAGOGASTRODUODENOSCOPY    . hemorrhage to bilateral eye    . PERIPHERAL VASCULAR BALLOON ANGIOPLASTY Right 12/03/2016   Procedure: PERIPHERAL VASCULAR BALLOON ANGIOPLASTY;  Surgeon: Angelia Mould, MD;  Location: [place] [facility];  Service: Cardiovascular;  Laterality: Right;  arm fistula  . ruptured ulcer in  stomach  2010   had a feeding tube over a month  . SHUNTOGRAM N/A 12/02/2012   Procedure: Fistulagram;  Surgeon: Serafina Mitchell, MD;  Location: [facility] C[facility];  Service: Cardiovascular;  Laterality: N/A;     Allergies  Allergen Reactions  . Sulfa Antibiotics Nausea Only    nausea      Family History  Problem Relation Age of Onset  . Diabetes Mother   . Cancer Father   . Diabetes Daughter   . Heart attack Daughter   . Pneumonia Daughter   . Anesthesia problems Neg Hx   . Hypotension Neg Hx   . Malignant hyperthermia Neg Hx   . Pseudochol deficiency Neg Hx      Social History Vanessa Webb reports that  has never smoked. she has never used smokeless tobacco. Vanessa Webb reports that she does not drink alcohol.   Review of Systems CONSTITUTIONAL: No weight loss, fever, chills, weakness or fatigue.  HEENT: Eyes: No visual loss, blurred vision, double vision or yellow sclerae.No hearing loss, sneezing, congestion, runny nose or sore throat.  SKIN: No rash or itching.  CARDIOVASCULAR: per hpi RESPIRATORY: No shortness of breath, cough or sputum.  GASTROINTESTINAL: No anorexia, nausea, vomiting or diarrhea. No abdominal pain or blood.  GENITOURINARY: No burning on urination, no polyuria NEUROLOGICAL: No headache, dizziness, syncope, paralysis, ataxia, numbness or tingling in the extremities. No change in bowel or bladder control.  MUSCULOSKELETAL: No muscle, back pain, joint pain or stiffness.  LYMPHATICS: No enlarged nodes. No history of splenectomy.  PSYCHIATRIC: No history of depression or anxiety.  ENDOCRINOLOGIC: No reports of sweating, cold or heat intolerance. No polyuria or polydipsia.  Marland Kitchen   Physical Examination Vitals:   01/28/17 1525  BP: (!) 180/70  Pulse: 65  SpO2: 96%   Vitals:   01/28/17 1525  Weight: 128 lb (58.1 kg)  Height: 5\' 4"  (1.626 m)    Gen: resting comfortably, no acute distress HEENT: no scleral icterus, pupils equal round and reactive, no  palptable cervical adenopathy,  CV: RRR, no m/r/g, no jvd Resp: Clear to auscultation bilaterally GI: abdomen is soft, non-tender, non-distended, normal bowel sounds, no hepatosplenomegaly MSK: extremities are warm, no edema.  Skin: warm, no rash Neuro:  no focal deficits Psych: appropriate affect   Diagnostic Studies     Assessment and Plan  1. PAF - CHADS2Vasc score is 4, continue coumadin - mild infrquent symptoms, continue current meds  2. HTN - can have low bp's during HD, high bp's on non-HD days - try starting norvasc 2.5mg  daily on non-HD days (M,W,F,Sun)      F/u 6 months      Arnoldo Lenis, M.D.

## 2017-01-30 ENCOUNTER — Encounter: Payer: Self-pay | Admitting: Cardiology

## 2017-03-08 ENCOUNTER — Telehealth: Payer: Self-pay | Admitting: Cardiology

## 2017-03-08 NOTE — Telephone Encounter (Signed)
Returned call to the pt & explained to the pt that she should stay on her normal dose of Coumadin. Clarified pt's dose of 2 tabs daily & 1 tab on Tuesday & Friday.  Pt aware that if she misses another appt in the future to remain on normal dose until she has her INR checked & she verbalized understanding. Confirmed her 03/12/17 at 235pm in [place]. Pt will call back with any other questions/issues.

## 2017-03-12 ENCOUNTER — Ambulatory Visit (INDEPENDENT_AMBULATORY_CARE_PROVIDER_SITE_OTHER): Payer: Medicare Other | Admitting: *Deleted

## 2017-03-12 DIAGNOSIS — I4891 Unspecified atrial fibrillation: Secondary | ICD-10-CM | POA: Diagnosis not present

## 2017-03-12 DIAGNOSIS — N186 End stage renal disease: Secondary | ICD-10-CM | POA: Diagnosis not present

## 2017-03-12 DIAGNOSIS — E118 Type 2 diabetes mellitus with unspecified complications: Secondary | ICD-10-CM | POA: Diagnosis not present

## 2017-03-12 DIAGNOSIS — I7025 Atherosclerosis of native arteries of other extremities with ulceration: Secondary | ICD-10-CM

## 2017-03-12 LAB — POCT INR: INR: 3

## 2017-03-12 NOTE — Patient Instructions (Signed)
Continue coumadin 2 tablets daily except 1 tablet on Tuesdays and Saturdays Recheck 6 weeks

## 2017-04-26 ENCOUNTER — Ambulatory Visit (INDEPENDENT_AMBULATORY_CARE_PROVIDER_SITE_OTHER): Payer: Medicare Other | Admitting: *Deleted

## 2017-04-26 DIAGNOSIS — I4891 Unspecified atrial fibrillation: Secondary | ICD-10-CM | POA: Diagnosis not present

## 2017-04-26 LAB — POCT INR: INR: 2.6

## 2017-04-26 NOTE — Patient Instructions (Signed)
Continue coumadin 2 tablets daily except 1 tablet on Tuesdays and Saturdays Recheck 6 weeks

## 2017-06-04 ENCOUNTER — Ambulatory Visit (INDEPENDENT_AMBULATORY_CARE_PROVIDER_SITE_OTHER): Payer: Medicare Other | Admitting: *Deleted

## 2017-06-04 DIAGNOSIS — I4891 Unspecified atrial fibrillation: Secondary | ICD-10-CM

## 2017-06-04 DIAGNOSIS — Z5181 Encounter for therapeutic drug level monitoring: Secondary | ICD-10-CM

## 2017-06-04 LAB — POCT INR: INR: 2.5

## 2017-06-04 NOTE — Patient Instructions (Signed)
Continue coumadin 2 tablets daily except 1 tablet on Tuesdays and Saturdays Recheck 6 weeks

## 2017-07-16 ENCOUNTER — Ambulatory Visit (INDEPENDENT_AMBULATORY_CARE_PROVIDER_SITE_OTHER): Payer: Medicare Other | Admitting: *Deleted

## 2017-07-16 DIAGNOSIS — I4891 Unspecified atrial fibrillation: Secondary | ICD-10-CM | POA: Diagnosis not present

## 2017-07-16 DIAGNOSIS — Z5181 Encounter for therapeutic drug level monitoring: Secondary | ICD-10-CM

## 2017-07-16 LAB — POCT INR: INR: 4.1 — AB (ref 2.0–3.0)

## 2017-07-16 NOTE — Patient Instructions (Signed)
Hold coumadin tonight, take 1 tablet tomorrow night then resume 2 tablets daily except 1 tablet on Tuesdays and Saturdays Recheck 3 weeks

## 2017-08-05 ENCOUNTER — Ambulatory Visit: Payer: Medicare Other | Admitting: Cardiology

## 2017-08-06 ENCOUNTER — Ambulatory Visit (INDEPENDENT_AMBULATORY_CARE_PROVIDER_SITE_OTHER): Payer: Medicare Other | Admitting: *Deleted

## 2017-08-06 ENCOUNTER — Ambulatory Visit: Payer: Medicare Other | Admitting: Cardiology

## 2017-08-06 ENCOUNTER — Encounter: Payer: Self-pay | Admitting: Cardiology

## 2017-08-06 VITALS — BP 138/60 | HR 72 | Ht 64.0 in | Wt 128.6 lb

## 2017-08-06 DIAGNOSIS — I10 Essential (primary) hypertension: Secondary | ICD-10-CM

## 2017-08-06 DIAGNOSIS — I4891 Unspecified atrial fibrillation: Secondary | ICD-10-CM

## 2017-08-06 LAB — POCT INR: INR: 4.1 — AB (ref 2.0–3.0)

## 2017-08-06 NOTE — Progress Notes (Signed)
Clinical Summary Vanessa Webb is a 72 y.o.female seen today for follow up of the following medical problems.   1.PAF - patient with recent admission to [facility]. Issues with tachycardia, notes mention episodes of SVT and afib - initial available EKG shows only narrow complex regular tachycardia consistent, SVT. However since last visit, we requested additional records and EKGs from [place]  did show previous episodes of atrial fibrillation.  - CHADS2Vasc is 4, she is on coumadin   - no recent palpitations - compliant with meds - no bleeding on coumadin.   2. ESRD - compliant with HD T,Th,Sat - can have some low bp's in HD   3. HTN - can have low bps during HD - compliant with meds.    Past Medical History:  Diagnosis Date  . Anemia    takes Folic Acid;pt states she gets an injection every 2wks   . Depression    but doesn't take any meds for it  . Diabetes mellitus    takes Lantus and Novolog 70/30;fasting blood sugar 200;Type 2  . GERD (gastroesophageal reflux disease)    takes Omeprazole daily  . GI bleeding   . History of bladder infections    last one a month ago;saw Dr.Wrenn and cysto was done in office;was on antibiotics and completed 2wks ago  . History of blood transfusion    no blood transfusion  . Hyperlipidemia    takes Zocor daily  . Hypertension    takes Coreg daily  . Myocardial infarction ([place]) 2010  . Pancreatitis   . Peripheral neuropathy   . UTI (lower urinary tract infection)      Allergies  Allergen Reactions  . Sulfa Antibiotics Nausea Only    nausea     Current Outpatient Medications  Medication Sig Dispense Refill  . ACCU-CHEK AVIVA PLUS test strip     . acetaminophen (TYLENOL) 500 MG tablet Take 500 mg by mouth every 6 (six) hours as needed (for pain.).    Marland Kitchen amLODipine (NORVASC) 2.5 MG tablet Take one tab by mouth only on non dialysis days (Monday, Wednesday, Friday, & Sunday) 70 tablet 3  . atorvastatin (LIPITOR) 20 MG  tablet Take 20 mg by mouth at bedtime.    . B Complex-C-Folic Acid (VP-VITE RX) 1 MG TABS Take 1 tablet by mouth at bedtime.    . B-D INS SYR ULTRAFINE 1CC/31G 31G X 5/16" 1 ML MISC     . insulin glargine (LANTUS) 100 UNIT/ML injection Inject 3-15 Units into the skin at bedtime. Sliding scale per patient    . Insulin Lispro Prot & Lispro (HUMALOG MIX 75/25 KWIKPEN [place]) Inject 0-18 Units into the skin daily before supper. Sliding scale insulin    . lidocaine-prilocaine (EMLA) cream Apply 1 application topically daily as needed (prior to port being accessed).     . metoprolol tartrate (LOPRESSOR) 25 MG tablet TAKE 1 AND 1/2 TABLETS BY MOUTH TWICE DAILY. (MORNING,BEDTIME) 270 tablet 3  . warfarin (COUMADIN) 2.5 MG tablet Take coumadin 2 tablets daily except 1 tablet on Tuesdays and Saturdays 60 tablet 6   No current facility-administered medications for this visit.      Past Surgical History:  Procedure Laterality Date  . A/V FISTULAGRAM Right 12/03/2016   Procedure: A/V Fistulagram - Right Arm;  Surgeon: Angelia Mould, MD;  Location: [place] [facility];  Service: Cardiovascular;  Laterality: Right;  . ABDOMINAL HYSTERECTOMY  1976  . APPENDECTOMY    . BASCILIC VEIN TRANSPOSITION  Right 09/11/2012   Procedure: BASCILIC VEIN TRANSPOSITION- RIGHT ARM;  Surgeon: Mal Misty, MD;  Location: [place];  Service: Vascular;  Laterality: Right;  . bilateral laser surgery    . CARDIAC CATHETERIZATION  2010  . CATARACT EXTRACTION W/PHACO  03/01/2011   Procedure: CATARACT EXTRACTION PHACO AND INTRAOCULAR LENS PLACEMENT (IOC);  Surgeon: Tonny Branch;  Location: [facility];  Service: Ophthalmology;  Laterality: Right;  CDE: 15.83  . CHOLECYSTECTOMY  2010  . DILATION AND CURETTAGE OF UTERUS  1965  . ESOPHAGOGASTRODUODENOSCOPY    . hemorrhage to bilateral eye    . PERIPHERAL VASCULAR BALLOON ANGIOPLASTY Right 12/03/2016   Procedure: PERIPHERAL VASCULAR BALLOON ANGIOPLASTY;  Surgeon: Angelia Mould, MD;   Location: [place] [facility];  Service: Cardiovascular;  Laterality: Right;  arm fistula  . ruptured ulcer in stomach  2010   had a feeding tube over a month  . SHUNTOGRAM N/A 12/02/2012   Procedure: Fistulagram;  Surgeon: Serafina Mitchell, MD;  Location[facility] C[facility];  Service: Cardiovascular;  Laterality: N/A;     Allergies  Allergen Reactions  . Sulfa Antibiotics Nausea Only    nausea      Family History  Problem Relation Age of Onset  . Diabetes Mother   . Cancer Father   . Diabetes Daughter   . Heart attack Daughter   . Pneumonia Daughter   . Anesthesia problems Neg Hx   . Hypotension Neg Hx   . Malignant hyperthermia Neg Hx   . Pseudochol deficiency Neg Hx      Social History Ms. Hollman reports that she has never smoked. She has never used smokeless tobacco. Ms. Barnett reports that she does not drink alcohol.   Review of Systems CONSTITUTIONAL: No weight loss, fever, chills, weakness or fatigue.  HEENT: Eyes: No visual loss, blurred vision, double vision or yellow sclerae.No hearing loss, sneezing, congestion, runny nose or sore throat.  SKIN: No rash or itching.  CARDIOVASCULAR: per hpi RESPIRATORY: No shortness of breath, cough or sputum.  GASTROINTESTINAL: No anorexia, nausea, vomiting or diarrhea. No abdominal pain or blood.  GENITOURINARY: No burning on urination, no polyuria NEUROLOGICAL: No headache, dizziness, syncope, paralysis, ataxia, numbness or tingling in the extremities. No change in bowel or bladder control.  MUSCULOSKELETAL: No muscle, back pain, joint pain or stiffness.  LYMPHATICS: No enlarged nodes. No history of splenectomy.  PSYCHIATRIC: No history of depression or anxiety.  ENDOCRINOLOGIC: No reports of sweating, cold or heat intolerance. No polyuria or polydipsia.  Marland Kitchen   Physical Examination Vitals:   08/06/17 1321  BP: 138/60  Pulse: 72  SpO2: 100%   Vitals:   08/06/17 1321  Weight: 128 lb 9.6 oz (58.3 kg)  Height: 5\' 4"  (1.626 m)      Gen: resting comfortably, no acute distress HEENT: no scleral icterus, pupils equal round and reactive, no palptable cervical adenopathy,  CV: RRR, no m/r/g, no jvd Resp: Clear to auscultation bilaterally GI: abdomen is soft, non-tender, non-distended, normal bowel sounds, no hepatosplenomegaly MSK: extremities are warm, no edema.  Skin: warm, no rash Neuro:  no focal deficits Psych: appropriate affect    Assessment and Plan  1. PAF - CHADS2Vasc score is 4,continue coumadin - EKG today shows normal sinus rhythm - no recent symptoms, continue current meds  2. HTN - reasonable control today, overall therapy limited by low bp's at times during HD   F/u 6 months      Arnoldo Lenis, M.D.

## 2017-08-06 NOTE — Patient Instructions (Signed)
Hold coumadin tonight then decrease dose to 1/2 tablet daily except 1 tablet on Mondays, Wednesdays and Fridays. Recheck 3 weeks

## 2017-08-06 NOTE — Patient Instructions (Signed)

## 2017-08-12 ENCOUNTER — Encounter: Payer: Self-pay | Admitting: Cardiology

## 2017-08-21 ENCOUNTER — Other Ambulatory Visit: Payer: Self-pay | Admitting: Cardiology

## 2017-08-27 ENCOUNTER — Ambulatory Visit (INDEPENDENT_AMBULATORY_CARE_PROVIDER_SITE_OTHER): Payer: Medicare Other | Admitting: *Deleted

## 2017-08-27 DIAGNOSIS — Z5181 Encounter for therapeutic drug level monitoring: Secondary | ICD-10-CM | POA: Diagnosis not present

## 2017-08-27 DIAGNOSIS — I4891 Unspecified atrial fibrillation: Secondary | ICD-10-CM

## 2017-08-27 LAB — POCT INR: INR: 2.2 (ref 2.0–3.0)

## 2017-08-27 NOTE — Patient Instructions (Signed)
Continue coumadin 1/2 tablet daily except 1 tablet on Mondays, Wednesdays and Fridays. Recheck 4 weeks

## 2017-09-05 ENCOUNTER — Other Ambulatory Visit: Payer: Self-pay | Admitting: Cardiology

## 2017-09-24 ENCOUNTER — Ambulatory Visit (INDEPENDENT_AMBULATORY_CARE_PROVIDER_SITE_OTHER): Payer: Medicare Other | Admitting: *Deleted

## 2017-09-24 DIAGNOSIS — Z5181 Encounter for therapeutic drug level monitoring: Secondary | ICD-10-CM | POA: Diagnosis not present

## 2017-09-24 DIAGNOSIS — I4891 Unspecified atrial fibrillation: Secondary | ICD-10-CM | POA: Diagnosis not present

## 2017-09-24 LAB — POCT INR: INR: 2.5 (ref 2.0–3.0)

## 2017-09-24 NOTE — Patient Instructions (Signed)
Continue coumadin 1 tablet daily except 2 tablets on Mondays, Wednesdays and Fridays. Recheck 4 weeks

## 2017-10-19 ENCOUNTER — Encounter (HOSPITAL_COMMUNITY): Payer: Self-pay | Admitting: Internal Medicine

## 2017-10-19 ENCOUNTER — Inpatient Hospital Stay (HOSPITAL_COMMUNITY): Payer: Medicare Other

## 2017-10-19 ENCOUNTER — Other Ambulatory Visit: Payer: Self-pay

## 2017-10-19 ENCOUNTER — Inpatient Hospital Stay (HOSPITAL_COMMUNITY)
Admission: AD | Admit: 2017-10-19 | Discharge: 2017-10-24 | DRG: 480 | Disposition: A | Discharge status: Skilled Nursing Facility | Payer: Medicare Other | Attending: Internal Medicine | Admitting: Internal Medicine

## 2017-10-19 DIAGNOSIS — S72001A Fracture of unspecified part of neck of right femur, initial encounter for closed fracture: Secondary | ICD-10-CM

## 2017-10-19 DIAGNOSIS — F329 Major depressive disorder, single episode, unspecified: Secondary | ICD-10-CM | POA: Diagnosis present

## 2017-10-19 DIAGNOSIS — Z882 Allergy status to sulfonamides status: Secondary | ICD-10-CM

## 2017-10-19 DIAGNOSIS — Z794 Long term (current) use of insulin: Secondary | ICD-10-CM

## 2017-10-19 DIAGNOSIS — I252 Old myocardial infarction: Secondary | ICD-10-CM | POA: Diagnosis not present

## 2017-10-19 DIAGNOSIS — Z961 Presence of intraocular lens: Secondary | ICD-10-CM | POA: Diagnosis present

## 2017-10-19 DIAGNOSIS — Z8249 Family history of ischemic heart disease and other diseases of the circulatory system: Secondary | ICD-10-CM

## 2017-10-19 DIAGNOSIS — K219 Gastro-esophageal reflux disease without esophagitis: Secondary | ICD-10-CM | POA: Diagnosis present

## 2017-10-19 DIAGNOSIS — W010XXA Fall on same level from slipping, tripping and stumbling without subsequent striking against object, initial encounter: Secondary | ICD-10-CM | POA: Diagnosis not present

## 2017-10-19 DIAGNOSIS — S72141A Displaced intertrochanteric fracture of right femur, initial encounter for closed fracture: Secondary | ICD-10-CM | POA: Diagnosis present

## 2017-10-19 DIAGNOSIS — Z9841 Cataract extraction status, right eye: Secondary | ICD-10-CM

## 2017-10-19 DIAGNOSIS — I2510 Atherosclerotic heart disease of native coronary artery without angina pectoris: Secondary | ICD-10-CM | POA: Diagnosis present

## 2017-10-19 DIAGNOSIS — M898X9 Other specified disorders of bone, unspecified site: Secondary | ICD-10-CM | POA: Diagnosis not present

## 2017-10-19 DIAGNOSIS — E1151 Type 2 diabetes mellitus with diabetic peripheral angiopathy without gangrene: Secondary | ICD-10-CM | POA: Diagnosis present

## 2017-10-19 DIAGNOSIS — Z79899 Other long term (current) drug therapy: Secondary | ICD-10-CM | POA: Diagnosis not present

## 2017-10-19 DIAGNOSIS — Z992 Dependence on renal dialysis: Secondary | ICD-10-CM

## 2017-10-19 DIAGNOSIS — E785 Hyperlipidemia, unspecified: Secondary | ICD-10-CM | POA: Diagnosis present

## 2017-10-19 DIAGNOSIS — Z7901 Long term (current) use of anticoagulants: Secondary | ICD-10-CM | POA: Diagnosis not present

## 2017-10-19 DIAGNOSIS — E1129 Type 2 diabetes mellitus with other diabetic kidney complication: Secondary | ICD-10-CM | POA: Diagnosis present

## 2017-10-19 DIAGNOSIS — I480 Paroxysmal atrial fibrillation: Secondary | ICD-10-CM | POA: Diagnosis present

## 2017-10-19 DIAGNOSIS — Z90710 Acquired absence of both cervix and uterus: Secondary | ICD-10-CM

## 2017-10-19 DIAGNOSIS — D631 Anemia in chronic kidney disease: Secondary | ICD-10-CM | POA: Diagnosis present

## 2017-10-19 DIAGNOSIS — N186 End stage renal disease: Secondary | ICD-10-CM | POA: Diagnosis present

## 2017-10-19 DIAGNOSIS — Z8781 Personal history of (healed) traumatic fracture: Secondary | ICD-10-CM

## 2017-10-19 DIAGNOSIS — I7025 Atherosclerosis of native arteries of other extremities with ulceration: Secondary | ICD-10-CM | POA: Diagnosis present

## 2017-10-19 DIAGNOSIS — M79673 Pain in unspecified foot: Secondary | ICD-10-CM

## 2017-10-19 DIAGNOSIS — I120 Hypertensive chronic kidney disease with stage 5 chronic kidney disease or end stage renal disease: Secondary | ICD-10-CM | POA: Diagnosis present

## 2017-10-19 DIAGNOSIS — E1122 Type 2 diabetes mellitus with diabetic chronic kidney disease: Secondary | ICD-10-CM | POA: Diagnosis present

## 2017-10-19 DIAGNOSIS — M79671 Pain in right foot: Secondary | ICD-10-CM | POA: Diagnosis present

## 2017-10-19 DIAGNOSIS — S72009A Fracture of unspecified part of neck of unspecified femur, initial encounter for closed fracture: Secondary | ICD-10-CM

## 2017-10-19 DIAGNOSIS — I10 Essential (primary) hypertension: Secondary | ICD-10-CM | POA: Diagnosis present

## 2017-10-19 DIAGNOSIS — Z419 Encounter for procedure for purposes other than remedying health state, unspecified: Secondary | ICD-10-CM

## 2017-10-19 HISTORY — DX: Fracture of unspecified part of neck of right femur, initial encounter for closed fracture: S72.001A

## 2017-10-19 HISTORY — DX: Hyperlipidemia, unspecified: E78.5

## 2017-10-19 HISTORY — DX: Essential (primary) hypertension: I10

## 2017-10-19 LAB — COMPREHENSIVE METABOLIC PANEL
ALT: 75 U/L — ABNORMAL HIGH (ref 0–44)
Anion gap: 12 (ref 5–15)
CO2: 27 mmol/L (ref 22–32)
Chloride: 99 mmol/L (ref 98–111)
GFR calc Af Amer: 7 mL/min — ABNORMAL LOW (ref >60)
GFR calc non Af Amer: 6 mL/min — ABNORMAL LOW (ref >60)
Potassium: 5.5 mmol/L — ABNORMAL HIGH (ref 3.5–5.1)
Sodium: 138 mmol/L (ref 135–145)
Total Bilirubin: 1.2 mg/dL (ref 0.3–1.2)

## 2017-10-19 LAB — COMPREHENSIVE METABOLIC PANEL WITH GFR
AST: 127 U/L — ABNORMAL HIGH (ref 15–41)
Albumin: 3.2 g/dL — ABNORMAL LOW (ref 3.5–5.0)
Alkaline Phosphatase: 91 U/L (ref 38–126)
BUN: 28 mg/dL — ABNORMAL HIGH (ref 8–23)
Calcium: 8.3 mg/dL — ABNORMAL LOW (ref 8.9–10.3)
Creatinine, Ser: 6.06 mg/dL — ABNORMAL HIGH (ref 0.44–1.00)
Glucose, Bld: 132 mg/dL — ABNORMAL HIGH (ref 70–99)
Total Protein: 7.1 g/dL (ref 6.5–8.1)

## 2017-10-19 LAB — CBC
HCT: 38.8 % (ref 36.0–46.0)
Hemoglobin: 12.1 g/dL (ref 12.0–15.0)
MCH: 27.2 pg (ref 26.0–34.0)
MCHC: 31.2 g/dL (ref 30.0–36.0)
MCV: 87.2 fL (ref 78.0–100.0)
Platelets: 98 K/uL — ABNORMAL LOW (ref 150–400)
RBC: 4.45 MIL/uL (ref 3.87–5.11)
RDW: 14.5 % (ref 11.5–15.5)
WBC: 8 10*3/uL (ref 4.0–10.5)

## 2017-10-19 LAB — MAGNESIUM: Magnesium: 1.9 mg/dL (ref 1.7–2.4)

## 2017-10-19 LAB — GLUCOSE, CAPILLARY
Glucose-Capillary: 106 mg/dL — ABNORMAL HIGH (ref 70–99)
Glucose-Capillary: 70 mg/dL (ref 70–99)

## 2017-10-19 LAB — PROTIME-INR
INR: 1.39
Prothrombin Time: 16.9 seconds — ABNORMAL HIGH (ref 11.4–15.2)

## 2017-10-19 LAB — SURGICAL PCR SCREEN
MRSA, PCR: NEGATIVE
Staphylococcus aureus: POSITIVE — AB

## 2017-10-19 LAB — APTT: aPTT: 33 s (ref 24–36)

## 2017-10-19 MED ORDER — HYDROMORPHONE HCL 1 MG/ML IJ SOLN
0.5000 mg | INTRAMUSCULAR | Status: DC | PRN
  Administered 2017-10-19 – 2017-10-22 (×4): 0.5 mg via INTRAVENOUS
  Filled 2017-10-19 (×2): qty 1

## 2017-10-19 MED ORDER — METOPROLOL TARTRATE 25 MG PO TABS
25.0000 mg | ORAL_TABLET | Freq: Two times a day (BID) | ORAL | Status: DC
  Administered 2017-10-19 – 2017-10-24 (×9): 25 mg via ORAL
  Filled 2017-10-19 (×9): qty 1

## 2017-10-19 MED ORDER — HYDRALAZINE HCL 20 MG/ML IJ SOLN: 10.0000 mg | INTRAMUSCULAR | Status: DC | PRN

## 2017-10-19 MED ORDER — ACETAMINOPHEN 650 MG RE SUPP: 650.0000 mg | Freq: Four times a day (QID) | RECTAL | Status: DC | PRN

## 2017-10-19 MED ORDER — PENTAFLUOROPROP-TETRAFLUOROETH EX AERO: 1.0000 "application " | INHALATION_SPRAY | CUTANEOUS | Status: DC | PRN

## 2017-10-19 MED ORDER — ONDANSETRON HCL 4 MG/2ML IJ SOLN: 4.0000 mg | Freq: Four times a day (QID) | INTRAMUSCULAR | Status: DC | PRN

## 2017-10-19 MED ORDER — SODIUM CHLORIDE 0.9% FLUSH
3.0000 mL | Freq: Two times a day (BID) | INTRAVENOUS | Status: DC
  Administered 2017-10-19 – 2017-10-24 (×9): 3 mL via INTRAVENOUS

## 2017-10-19 MED ORDER — ONDANSETRON HCL 4 MG PO TABS: 4.0000 mg | ORAL_TABLET | Freq: Four times a day (QID) | ORAL | Status: DC | PRN

## 2017-10-19 MED ORDER — DIPHENOXYLATE-ATROPINE 2.5-0.025 MG PO TABS: 1.0000 | ORAL_TABLET | Freq: Four times a day (QID) | ORAL | Status: DC | PRN

## 2017-10-19 MED ORDER — POLYETHYLENE GLYCOL 3350 17 G PO PACK: 17.0000 g | PACK | Freq: Every day | ORAL | Status: DC | PRN

## 2017-10-19 MED ORDER — INSULIN ASPART 100 UNIT/ML ~~LOC~~ SOLN: 0.0000 [IU] | Freq: Every day | SUBCUTANEOUS | Status: DC

## 2017-10-19 MED ORDER — SODIUM CHLORIDE 0.9 % IV SOLN: 100.0000 mL | INTRAVENOUS | Status: DC | PRN

## 2017-10-19 MED ORDER — OXYCODONE HCL 5 MG PO TABS
5.0000 mg | ORAL_TABLET | ORAL | Status: DC | PRN
  Administered 2017-10-19: 5 mg via ORAL
  Filled 2017-10-19: qty 1

## 2017-10-19 MED ORDER — CHLORHEXIDINE GLUCONATE CLOTH 2 % EX PADS
6.0000 | MEDICATED_PAD | Freq: Every day | CUTANEOUS | Status: DC
  Administered 2017-10-20 – 2017-10-22 (×2): 6 via TOPICAL

## 2017-10-19 MED ORDER — SODIUM CHLORIDE 0.9% FLUSH: 3.0000 mL | INTRAVENOUS | Status: DC | PRN

## 2017-10-19 MED ORDER — ATORVASTATIN CALCIUM 20 MG PO TABS
20.0000 mg | ORAL_TABLET | Freq: Every day | ORAL | Status: DC
  Administered 2017-10-19 – 2017-10-23 (×5): 20 mg via ORAL
  Filled 2017-10-19 (×5): qty 1

## 2017-10-19 MED ORDER — HYDROMORPHONE HCL 1 MG/ML IJ SOLN
INTRAMUSCULAR | Status: AC
  Administered 2017-10-19: 0.5 mg via INTRAVENOUS
  Filled 2017-10-19: qty 0.5

## 2017-10-19 MED ORDER — AMLODIPINE BESYLATE 5 MG PO TABS
2.5000 mg | ORAL_TABLET | ORAL | Status: DC
  Administered 2017-10-20 – 2017-10-23 (×3): 2.5 mg via ORAL
  Filled 2017-10-19 (×3): qty 1

## 2017-10-19 MED ORDER — ACETAMINOPHEN 325 MG PO TABS: 650.0000 mg | ORAL_TABLET | Freq: Four times a day (QID) | ORAL | Status: DC | PRN

## 2017-10-19 MED ORDER — CLONIDINE HCL 0.1 MG PO TABS: 0.1000 mg | ORAL_TABLET | ORAL | Status: AC | PRN

## 2017-10-19 MED ORDER — LIDOCAINE-PRILOCAINE 2.5-2.5 % EX CREA: 1.0000 "application " | TOPICAL_CREAM | CUTANEOUS | Status: DC | PRN

## 2017-10-19 MED ORDER — INSULIN ASPART 100 UNIT/ML ~~LOC~~ SOLN
0.0000 [IU] | Freq: Three times a day (TID) | SUBCUTANEOUS | Status: DC
  Administered 2017-10-21: 2 [IU] via SUBCUTANEOUS
  Administered 2017-10-21: 1 [IU] via SUBCUTANEOUS
  Administered 2017-10-21: 2 [IU] via SUBCUTANEOUS

## 2017-10-19 MED ORDER — SODIUM CHLORIDE 0.9 % IV SOLN
250.0000 mL | INTRAVENOUS | Status: DC | PRN
  Administered 2017-10-20 (×2): via INTRAVENOUS

## 2017-10-19 NOTE — Consult Note (Signed)
Reason for Consult:  Right hip fracture Referring Physician: [facility] in [place], [place] is an 72 y.o. female.  HPI: The patient is a pleasant 72 year old female with multiple medical problems who is on dialysis as well and unfortunately sustained an accidental mechanical fall earlier today.  She was seen at outlying hospital and found to have a right hip intertrochanteric fracture.  I was contacted by the ER staff at the outlying hospital who requested transfer to the [facility] system due to the complexity of the patient's comorbidities.  She was transferred to the renal floor and is being seen by the hospitalist service.  She is on Coumadin.  She does report right hip pain which is quite significant and mild right foot pain.  She understands that we would not proceed with surgery until tomorrow pending her labs and medical work-up.  I talked her in length in detail what this type of surgery involves.  Past Medical History:  Diagnosis Date  . Anemia    takes Folic Acid;pt states she gets an injection every 2wks   . Closed right hip fracture, initial encounter ([place]) 10/19/2017  . Depression    but doesn't take any meds for it  . Diabetes mellitus    takes Lantus and Novolog 70/30;fasting blood sugar 200;Type 2  . GERD (gastroesophageal reflux disease)    takes Omeprazole daily  . GI bleeding   . History of bladder infections    last one a month ago;saw Dr.Wrenn and cysto was done in office;was on antibiotics and completed 2wks ago  . History of blood transfusion    no blood transfusion  . HLD (hyperlipidemia) 10/19/2017  . HTN (hypertension) 10/19/2017  . Hyperlipidemia    takes Zocor daily  . Hypertension    takes Coreg daily  . Myocardial infarction ([place]) 2010  . Pancreatitis   . Peripheral neuropathy   . UTI (lower urinary tract infection)     Past Surgical History:  Procedure Laterality Date  . A/V FISTULAGRAM Right 12/03/2016   Procedure: A/V Fistulagram -  Right Arm;  Surgeon: Angelia Mould, MD;  Location: [place] [facility];  Service: Cardiovascular;  Laterality: Right;  . ABDOMINAL HYSTERECTOMY  1976  . APPENDECTOMY    . BASCILIC VEIN TRANSPOSITION Right 09/11/2012   Procedure: BASCILIC VEIN TRANSPOSITION- RIGHT ARM;  Surgeon: Mal Misty, MD;  Location: [place];  Service: Vascular;  Laterality: Right;  . bilateral laser surgery    . CARDIAC CATHETERIZATION  2010  . CATARACT EXTRACTION W/PHACO  03/01/2011   Procedure: CATARACT EXTRACTION PHACO AND INTRAOCULAR LENS PLACEMENT (IOC);  Surgeon: Tonny Branch;  [facility];  Service: Ophthalmology;  Laterality: Right;  CDE: 15.83  . CHOLECYSTECTOMY  2010  . DILATION AND CURETTAGE OF UTERUS  1965  . ESOPHAGOGASTRODUODENOSCOPY    . hemorrhage to bilateral eye    . PERIPHERAL VASCULAR BALLOON ANGIOPLASTY Right 12/03/2016   Procedure: PERIPHERAL VASCULAR BALLOON ANGIOPLASTY;  Surgeon: Angelia Mould, MD;  Location: [place] [facility];  Service: Cardiovascular;  Laterality: Right;  arm fistula  . ruptured ulcer in stomach  2010   had a feeding tube over a month  . SHUNTOGRAM N/A 12/02/2012   Procedure: Fistulagram;  Surgeon: Serafina Mitchell, MD;  Location: [facility] CATH LAB;  Service: Cardiovascular;  Laterality: N/A;    Family History  Problem Relation Age of Onset  . Diabetes Mother   . Cancer Father   . Diabetes Daughter   .  Heart attack Daughter   . Pneumonia Daughter   . Anesthesia problems Neg Hx   . Hypotension Neg Hx   . Malignant hyperthermia Neg Hx   . Pseudochol deficiency Neg Hx     Social History:  reports that she has never smoked. She has never used smokeless tobacco. She reports that she does not drink alcohol or use drugs.  Allergies:  Allergies  Allergen Reactions  . Sulfa Antibiotics Nausea Only    nausea    Medications: I have reviewed the patient's current medications.  No results found for this or any previous visit (from the past 48 hour(s)).  No  results found.  Review of Systems  Musculoskeletal: Positive for joint pain.  All other systems reviewed and are negative.  Blood pressure (!) 193/66, pulse 73, temperature 97.8 F (36.6 C), temperature source Oral, resp. rate 18, height 5\' 4"  (1.626 m), weight 51 kg, SpO2 100 %. Physical Exam  Constitutional: She is oriented to person, place, and time. She appears well-developed and well-nourished.  Eyes: Pupils are equal, round, and reactive to light.  Neck: Normal range of motion.  Cardiovascular: Normal rate.  Respiratory: Effort normal.  GI: Soft.  Musculoskeletal:       Right hip: She exhibits decreased range of motion, decreased strength, tenderness, bony tenderness and deformity.       Right foot: There is bony tenderness.  Neurological: She is alert and oriented to person, place, and time.  Skin: Skin is warm and dry.  Psychiatric: She has a normal mood and affect.    Assessment/Plan: Displaced right hip intertrochanteric fracture  The plan will be to proceed to the operating room for surgery tomorrow pending medical clearance.  She can eat today and will have her n.p.o. after midnight in anticipation of surgery tomorrow.  I had a long thorough discussion about the risk and benefits of this type of surgery.  She understands given the significance of her pain and the detrimental effect this is having on her mobility and quality of life that surgery is a viable option.  She understands her goals are to be decreased pain, improve mobility and overall improve quality of life with hopefully diminishing the risks of pneumonia, bedsores and DVT as well as other associated risks of untreated hip fractures.  Mcarthur Rossetti 10/19/2017, 4:22 PM

## 2017-10-19 NOTE — Consult Note (Signed)
Renal Service Consult Note [place] Duc[place] 10/19/2017 [place]azing Requesting Physician:  Dr Herbert Moors  Reason for Consult:  ESRD pt w/ hip fracture HPI: The patient is a 72 y.o. year-old w/ hx of HTN, afib, CAD sp MI, ESRD on HD TTS [facility] admitted here for R hip fracture after a fall.  Did not get HD today. Went to ED at [facility] and was transferred here for surgery due to her comorbidities.  INR is pending. She is going to be NPO after MN for possible surgery tomorrow.   PMH sig for HTN, UTI, pancreatitis, MI, HTN, HL, GI bleed, DM ( off on insulin now), depression and anemia.   She denies any recent HD issues, on HD x 4 yrs w/ Dr Hinda Lenis, using RUA AVF.  Only c/o is R hip pain. Labs here are pending.   Labs from [place] this am showed >>   INR 1.2   Na 137  K 4.8  BUN 26  Cr 5.24  CA 8.6   LFT's ok  Alb 3.5  WBC 7k  Hb 11.8  plt 98k    EKG showed NSR 60 bpm.     ROS  denies CP  no joint pain   no HA  no blurry vision  no rash  no diarrhea  no nausea/ vomiting   Past Medical History  Past Medical History:  Diagnosis Date  . Anemia    takes Folic Acid;pt states she gets an injection every 2wks   . Closed right hip fracture, initial encounter ([place]) 10/19/2017  . Depression    but doesn't take any meds for it  . Diabetes mellitus    takes Lantus and Novolog 70/30;fasting blood sugar 200;Type 2  . GERD (gastroesophageal reflux disease)    takes Omeprazole daily  . GI bleeding   . History of bladder infections    last one a month ago;saw Dr.Wrenn and cysto was done in office;was on antibiotics and completed 2wks ago  . History of blood transfusion    no blood transfusion  . HLD (hyperlipidemia) 10/19/2017  . HTN (hypertension) 10/19/2017  . Hyperlipidemia    takes Zocor daily  . Hypertension    takes Coreg daily  . Myocardial infarction ([place]) 2010  . Pancreatitis   . Peripheral neuropathy   . UTI (lower urinary tract  infection)    Past Surgical History  Past Surgical History:  Procedure Laterality Date  . A/V FISTULAGRAM Right 12/03/2016   Procedure: A/V Fistulagram - Right Arm;  Surgeon: Angelia Mould, MD;  Location: [place] [facility];  Service: Cardiovascular;  Laterality: Right;  . ABDOMINAL HYSTERECTOMY  1976  . APPENDECTOMY    . BASCILIC VEIN TRANSPOSITION Right 09/11/2012   Procedure: BASCILIC VEIN TRANSPOSITION- RIGHT ARM;  Surgeon: Mal Misty, MD;  Location: [place];  Service: Vascular;  Laterality: Right;  . bilateral laser surgery    . CARDIAC CATHETERIZATION  2010  . CATARACT EXTRACTION W/PHACO  03/01/2011   Procedure: CATARACT EXTRACTION PHACO AND INTRAOCULAR LENS PLACEMENT (IOC);  Surgeon: Tonny Branch;  Location: [facility];  Service: Ophthalmology;  Laterality: Right;  CDE: 15.83  . CHOLECYSTECTOMY  2010  . DILATION AND CURETTAGE OF UTERUS  1965  . ESOPHAGOGASTRODUODENOSCOPY    . hemorrhage to bilateral eye    . PERIPHERAL VASCULAR BALLOON ANGIOPLASTY Right 12/03/2016   Procedure: PERIPHERAL VASCULAR BALLOON ANGIOPLASTY;  Surgeon: Angelia Mould, MD;  Location: [place] [facility];  Service: Cardiovascular;  Laterality: Right;  arm fistula  . ruptured ulcer in stomach  2010   had a feeding tube over a month  . SHUNTOGRAM N/A 12/02/2012   Procedure: Fistulagram;  Surgeon: Serafina Mitchell, MD;  Location[facility];  Service: Cardiovascular;  Laterality: N/A;   Family History  Family History  Problem Relation Age of Onset  . Diabetes Mother   . Cancer Father   . Diabetes Daughter   . Heart attack Daughter   . Pneumonia Daughter   . Anesthesia problems Neg Hx   . Hypotension Neg Hx   . Malignant hyperthermia Neg Hx   . Pseudochol deficiency Neg Hx    Social History  reports that she has never smoked. She has never used smokeless tobacco. She reports that she does not drink alcohol or use drugs. Allergies  Allergies  Allergen Reactions  . Sulfa Antibiotics Nausea Only     nausea   Home medications Prior to Admission medications   Medication Sig Start Date End Date Taking? Authorizing Provider  acetaminophen (TYLENOL) 500 MG tablet Take 500 mg by mouth every 6 (six) hours as needed (for pain.).   Yes [provider]  amLODipine (NORVASC) 2.5 MG tablet Take one tab by mouth only on non dialysis days (Monday, Wednesday, Friday, & Sunday) 01/28/17  Yes Branch, Alphonse Guild, MD  atorvastatin (LIPITOR) 20 MG tablet Take 20 mg by mouth at bedtime.   Yes [provider]  diphenoxylate-atropine (LOMOTIL) 2.5-0.025 MG tablet Take 1 tablet by mouth 4 (four) times daily as needed for diarrhea or loose stools. 09/24/17  Yes [provider]  insulin glargine (LANTUS) 100 UNIT/ML injection Inject 3-15 Units into the skin at bedtime. Sliding scale per patient   Yes [provider]  lidocaine-prilocaine (EMLA) cream Apply 1 application topically daily as needed (prior to port being accessed).  02/15/15  Yes [provider]  metoprolol tartrate (LOPRESSOR) 25 MG tablet TAKE 1 AND 1/2 TABLETS BY MOUTH TWICE DAILY. (MORNING,BEDTIME) Patient taking differently: Take 25 mg by mouth 2 (two) times daily.  08/21/17  Yes Arnoldo Lenis, MD  warfarin (COUMADIN) 2.5 MG tablet Take 1 tablet daily except 2 tablets on Mondays, Wednesdays and Fridays or as directed Patient taking differently: Take 2.5-5 mg by mouth See admin instructions. Take 1 tablet (2.5mg ) daily on Tuesdays,Thursdays, Saturdays & Sundays. Then take 2 tablets (5mg ) daily on Mondays, Wednesdays and Fridays. 09/05/17  Yes BranchAlphonse Guild, MD   Liver Function Tests No results for input(s): AST, ALT, ALKPHOS, BILITOT, PROT, ALBUMIN in the last 168 hours. No results for input(s): LIPASE, AMYLASE in the last 168 hours. CBC No results for input(s): WBC, NEUTROABS, HGB, HCT, MCV, PLT in the last 168 hours. Basic Metabolic Panel No results for input(s): NA, K, CL, CO2, GLUCOSE, BUN,  CREATININE, CALCIUM, PHOS in the last 168 hours. Iron/TIBC/Ferritin/ %Sat No results found for: IRON, TIBC, FERRITIN, IRONPCTSAT  Vitals:   10/19/17 1533 10/19/17 1541  BP: (!) 193/66   Pulse: 73   Resp: 18   Temp: 97.8 F (36.6 C)   TempSrc: Oral   SpO2: 100%   Weight:  51 kg  Height:  5\' 4"  (1.626 m)   Exam Gen alert, elderly WF no distress, lying flat No rash, cyanosis or gangrene Sclera anicteric, throat clear No jvd or bruits Chest clear bilat RRR no MRG Abd soft ntnd no mass or ascites +bs GU defer MS no joint effusions or deformity Ext  no LE or UE edema, no wounds or ulcers Neuro is alert, Ox 3 , nf RUA AVF+bruit    Home meds:  - amlodipine 2.5 on mwf and Sunday/ metoprolol tartrate 25 bid  - warfarin 2.5mg  hs nonHD days and 5mg  hs mwf  - insulin glargine 3-15u hs  - diphyoxylate-atropine 1 qid prn diarrhea/ atorvastatin 20 hs  Dialysis: [facility] TTS  3h 73min and dry wt 54kg per patient     Impression: 1. R hip fracture - after fall today. INR is low, on for surg in am.   2. ESRD usual HD TTS.  Missed HD today due to #1. Plan gentle HD this evening.  3. Volume - is under dry wt, no fluid off w/ HD tonight 4. Afib paroxysmal - NSR per EKG from [facility] facility.  INR is low at 1.2- 1.3 today.  5. HTN - BP's up, resume home meds, prn hydralazine and prn clonidine ordered 6. DM2 - per primary   Plan - as above  Kelly Splinter MD [place] pager [phone]   10/19/2017, 4:40 PM

## 2017-10-19 NOTE — Progress Notes (Signed)
HD tx completed @ 2305 w/o problem, UF goal of keep even met, blood rinsed back, VSS, report called to Rodena Piety, Therapist, sports

## 2017-10-19 NOTE — Progress Notes (Signed)
HD tx initiated via 15Gx2 w/o problem, pull/push/flush well w/o problem, VSS w/ increased bp, will cont to monitor while on HD tx

## 2017-10-19 NOTE — H&P (Signed)
History and Physical    Vanessa Webb OXB:[id number] DOB: 1946/01/25 DOA: 10/19/2017   PCP: Monico Blitz, MD   Patient coming from: home, [facility]   Chief Complaint: hip fracture on R  HPI: Vanessa Webb is a 72 y.o. female with medical history significant of type 2 diabetes on sliding scale insulin, hypertension, hyperlipidemia, coronary artery disease status post MI with no intervention, paroxysmal atrial fibrillation on warfarin, ESRD on Tuesday/Thursday/Saturday dialysis at [facility] in [facility] who comes in after mechanical with right hip fracture.  Patient reports that she was going to dialysis and she tripped over a piece of equipment and fell on her right hip.  She did not hit her head or lose any consciousness.  She denies any nausea, vomiting, diarrhea, chest pain, palpitations, syncope/presyncope.  She has not had any orthopnea or lower extremity edema.  She immediately felt right hip pain and was transferred to the emergency room at [facility] rocking him where she was noted to have a right intertrochanteric fracture.  She also reported some foot pain and was thought to have a possible second proximal phalanx fracture.  Her labs were reported as appropriate however she has been having INR checked.  She reports her last INR was therapeutic.  ED Course: Patient transferred from [facility] rocking him but per signout patient was noted to have a unremarkable EKG with a troponin of 0.25 which is around her baseline for her ESRD.  Her x-ray showed a right hip fracture and possible right proximal second phalanx fracture.  Markable.  Patient was transferred here.  Review of Systems: As per HPI otherwise 10 point review of systems negative.    Past Medical History:  Diagnosis Date  . Anemia    takes Folic Acid;pt states she gets an injection every 2wks   . Closed right hip fracture, initial encounter ([place]) 10/19/2017  . Depression    but doesn't take any meds for it  . Diabetes  mellitus    takes Lantus and Novolog 70/30;fasting blood sugar 200;Type 2  . GERD (gastroesophageal reflux disease)    takes Omeprazole daily  . GI bleeding   . History of bladder infections    last one a month ago;saw Dr.Wrenn and cysto was done in office;was on antibiotics and completed 2wks ago  . History of blood transfusion    no blood transfusion  . HLD (hyperlipidemia) 10/19/2017  . HTN (hypertension) 10/19/2017  . Hyperlipidemia    takes Zocor daily  . Hypertension    takes Coreg daily  . Myocardial infarction ([place]) 2010  . Pancreatitis   . Peripheral neuropathy   . UTI (lower urinary tract infection)     Past Surgical History:  Procedure Laterality Date  . A/V FISTULAGRAM Right 12/03/2016   Procedure: A/V Fistulagram - Right Arm;  Surgeon: Angelia Mould, MD;  Location: [place] [facility];  Service: Cardiovascular;  Laterality: Right;  . ABDOMINAL HYSTERECTOMY  1976  . APPENDECTOMY    . BASCILIC VEIN TRANSPOSITION Right 09/11/2012   Procedure: BASCILIC VEIN TRANSPOSITION- RIGHT ARM;  Surgeon: Mal Misty, MD;  Location: [place];  Service: Vascular;  Laterality: Right;  . bilateral laser surgery    . CARDIAC CATHETERIZATION  2010  . CATARACT EXTRACTION W/PHACO  03/01/2011   Procedure: CATARACT EXTRACTION PHACO AND INTRAOCULAR LENS PLACEMENT (IOC);  Surgeon: Tonny Branch;  Location: [facility];  Service: Ophthalmology;  Laterality: Right;  CDE: 15.83  . CHOLECYSTECTOMY  2010  . DILATION  AND CURETTAGE OF UTERUS  1965  . ESOPHAGOGASTRODUODENOSCOPY    . hemorrhage to bilateral eye    . PERIPHERAL VASCULAR BALLOON ANGIOPLASTY Right 12/03/2016   Procedure: PERIPHERAL VASCULAR BALLOON ANGIOPLASTY;  Surgeon: Angelia Mould, MD;  Location: [place] [facility];  Service: Cardiovascular;  Laterality: Right;  arm fistula  . ruptured ulcer in stomach  2010   had a feeding tube over a month  . SHUNTOGRAM N/A 12/02/2012   Procedure: Fistulagram;  Surgeon: Serafina Mitchell, MD;   Location: [facility] CATH LAB;  Service: Cardiovascular;  Laterality: N/A;     reports that she has never smoked. She has never used smokeless tobacco. She reports that she does not drink alcohol or use drugs.  Allergies  Allergen Reactions  . Sulfa Antibiotics Nausea Only    nausea    Family History  Problem Relation Age of Onset  . Diabetes Mother   . Cancer Father   . Diabetes Daughter   . Heart attack Daughter   . Pneumonia Daughter   . Anesthesia problems Neg Hx   . Hypotension Neg Hx   . Malignant hyperthermia Neg Hx   . Pseudochol deficiency Neg Hx      Prior to Admission medications   Medication Sig Start Date End Date Taking? Authorizing Provider  ACCU-CHEK AVIVA PLUS test strip  02/26/15   [provider]  acetaminophen (TYLENOL) 500 MG tablet Take 500 mg by mouth every 6 (six) hours as needed (for pain.).    [provider]  amLODipine (NORVASC) 2.5 MG tablet Take one tab by mouth only on non dialysis days (Monday, Wednesday, Friday, & Sunday) 01/28/17   Arnoldo Lenis, MD  atorvastatin (LIPITOR) 20 MG tablet Take 20 mg by mouth at bedtime.    [provider]  insulin glargine (LANTUS) 100 UNIT/ML injection Inject 3-15 Units into the skin at bedtime. Sliding scale per patient    [provider]  lidocaine-prilocaine (EMLA) cream Apply 1 application topically daily as needed (prior to port being accessed).  02/15/15   [provider]  metoprolol tartrate (LOPRESSOR) 25 MG tablet TAKE 1 AND 1/2 TABLETS BY MOUTH TWICE DAILY. (MORNING,BEDTIME) 08/21/17   Arnoldo Lenis, MD  warfarin (COUMADIN) 2.5 MG tablet Take 1 tablet daily except 2 tablets on Mondays, Wednesdays and Fridays or as directed 09/05/17   Arnoldo Lenis, MD    Physical Exam: Vitals:   10/19/17 1533  BP: (!) 193/66  Pulse: 73  Resp: 18  Temp: 97.8 F (36.6 C)  TempSrc: Oral  SpO2: 100%    Constitutional: NAD, calm, comfortable Vitals:   10/19/17 1533   BP: (!) 193/66  Pulse: 73  Resp: 18  Temp: 97.8 F (36.6 C)  TempSrc: Oral  SpO2: 100%   Eyes: Anicteric sclera ENMT: Dry mucous membranes, poor dentition.  Neck: normal, supple Respiratory: No increased work of breathing, clear to auscultation over anterior lung fields, no wheezes, rhonchi, rales Cardiovascular: Irregularly irregular, 2 out of 6 referred systolic murmur,.  Abdomen: no tenderness, no masses palpated. No hepatosplenomegaly. Bowel sounds positive.  Musculoskeletal: No lower extremity edema, right upper extremity fistula with palpable thrill and audible bruit.  Intact distal pulses of her right lower extremity, pain with movement Skin: no rashes over visible skin Neurologic: Grossly intact, not moving right lower extremity due to pain Psychiatric: Normal judgment and insight. Alert and oriented x 3. Normal mood.    Labs on Admission: I have personally reviewed following labs and  imaging studies  CBC: No results for input(s): WBC, NEUTROABS, HGB, HCT, MCV, PLT in the last 168 hours. Basic Metabolic Panel: No results for input(s): NA, K, CL, CO2, GLUCOSE, BUN, CREATININE, CALCIUM, MG, PHOS in the last 168 hours. GFR: CrCl cannot be calculated (Patient's most recent lab result is older than the maximum 21 days allowed.). Liver Function Tests: No results for input(s): AST, ALT, ALKPHOS, BILITOT, PROT, ALBUMIN in the last 168 hours. No results for input(s): LIPASE, AMYLASE in the last 168 hours. No results for input(s): AMMONIA in the last 168 hours. Coagulation Profile: No results for input(s): INR, PROTIME in the last 168 hours. Cardiac Enzymes: No results for input(s): CKTOTAL, CKMB, CKMBINDEX, TROPONINI in the last 168 hours. BNP (last 3 results) No results for input(s): PROBNP in the last 8760 hours. HbA1C: No results for input(s): HGBA1C in the last 72 hours. CBG: No results for input(s): GLUCAP in the last 168 hours. Lipid Profile: No results for input(s):  CHOL, HDL, LDLCALC, TRIG, CHOLHDL, LDLDIRECT in the last 72 hours. Thyroid Function Tests: No results for input(s): TSH, T4TOTAL, FREET4, T3FREE, THYROIDAB in the last 72 hours. Anemia Panel: No results for input(s): VITAMINB12, FOLATE, FERRITIN, TIBC, IRON, RETICCTPCT in the last 72 hours. Urine analysis:    Component Value Date/Time   COLORURINE YELLOW 01/21/2009 1719   APPEARANCEUR CLOUDY (A) 01/21/2009 1719   LABSPEC 1.021 01/21/2009 1719   PHURINE 5.0 01/21/2009 1719   GLUCOSEU NEGATIVE 01/21/2009 1719   HGBUR TRACE (A) 01/21/2009 1719   BILIRUBINUR MODERATE (A) 01/21/2009 1719   KETONESUR 15 (A) 01/21/2009 1719   PROTEINUR 30 (A) 01/21/2009 1719   UROBILINOGEN 1.0 01/21/2009 1719   NITRITE POSITIVE (A) 01/21/2009 1719   LEUKOCYTESUR MODERATE (A) 01/21/2009 1719    Radiological Exams on Admission: No results found.  EKG: Independently reviewed.  Pending  Assessment/Plan Principal Problem:   Closed right hip fracture, initial encounter ([place]) Active Problems:   End stage renal disease (HCC)   Atherosclerosis of native arteries of the extremities with ulceration (HCC)   DM (diabetes mellitus) with complications (HCC)   Atrial fibrillation, unspecified type ([place])   HTN (hypertension)   HLD (hyperlipidemia)    #) Right hip fracture: In the setting of mechanical fall. -Repeat pelvic x-rays and hip x-ray ordered -Orthopedic surgery consult, Dr. Ninfa Linden - N.p.o. at midnight - We will check INR and hold warfarin  #) Right foot pain: Reported outside hospital noted possible second proximal phalanx fracture. -Repeat x-rays -Ortho consult per above  #) ESRD on Tuesday/Thursday/Saturday dialysis: Patient is dialyzed at [place] in [facility]. -Nephrology consult to complete dialysis likely tomorrow as she missed her Saturday dialysis due to fall and fracture  #) Nonobstructing coronary artery disease status post NSTEMI: Patient reports remote history with cardiac  catheterization that had no need for intervention. -Continue atorvastatin 20 mg nightly - Continue metoprolol tartrate 37.5 mg twice daily  #) Hypertension/hyperlipidemia: -Continue statin -Continue beta-blocker -Continue amlodipine 2.5 mg on Monday, Wednesday, Friday, Sunday  #) Type 2 diabetes: Patient reports that she is gradually been weaned off of insulin as she is quite sensitive and likely because of her ESRD.  She reports only being on sliding scale at bedtime. -Pending medication reconciliation -Sensitive sliding scale, AC at bedtime  #) Chronic atrial fibrillation: -Hold home warfarin -Pending INR  Fluids: Tolerating p.o. Letter lites: Monitor and supplement Exline nutrition: Renal/carb restricted diet  Prophylaxis: None needed pending INR  Disposition: Pending surgery and PT consult  Full code   Cristy Folks MD [facility]  If 7PM-7AM, please contact night-coverage [email or web address] Password [facility]  10/19/2017, 4:03 PM

## 2017-10-20 ENCOUNTER — Inpatient Hospital Stay (HOSPITAL_COMMUNITY): Payer: Medicare Other | Admitting: Certified Registered"

## 2017-10-20 ENCOUNTER — Encounter (HOSPITAL_COMMUNITY): Payer: Self-pay | Admitting: Certified Registered"

## 2017-10-20 ENCOUNTER — Encounter (HOSPITAL_COMMUNITY)
Admission: AD | Disposition: A | Discharge status: Skilled Nursing Facility | Payer: Self-pay | Source: Home / Self Care | Attending: Internal Medicine

## 2017-10-20 ENCOUNTER — Inpatient Hospital Stay (HOSPITAL_COMMUNITY): Payer: Medicare Other

## 2017-10-20 HISTORY — PX: INTRAMEDULLARY (IM) NAIL INTERTROCHANTERIC: SHX5875

## 2017-10-20 LAB — BASIC METABOLIC PANEL WITH GFR
Anion gap: 11 (ref 5–15)
BUN: 12 mg/dL (ref 8–23)
GFR calc Af Amer: 14 mL/min — ABNORMAL LOW (ref >60)
GFR calc non Af Amer: 12 mL/min — ABNORMAL LOW (ref >60)
Potassium: 4.5 mmol/L (ref 3.5–5.1)
Sodium: 139 mmol/L (ref 135–145)

## 2017-10-20 LAB — HEPATITIS B SURFACE ANTIBODY,QUALITATIVE: HEP B S AB: REACTIVE

## 2017-10-20 LAB — PROTIME-INR
INR: 1.5
Prothrombin Time: 18 seconds — ABNORMAL HIGH (ref 11.4–15.2)

## 2017-10-20 LAB — BASIC METABOLIC PANEL
CO2: 29 mmol/L (ref 22–32)
Calcium: 8.2 mg/dL — ABNORMAL LOW (ref 8.9–10.3)
Chloride: 99 mmol/L (ref 98–111)
Creatinine, Ser: 3.54 mg/dL — ABNORMAL HIGH (ref 0.44–1.00)
Glucose, Bld: 98 mg/dL (ref 70–99)

## 2017-10-20 LAB — CBC
HCT: 34.6 % — ABNORMAL LOW (ref 36.0–46.0)
Hemoglobin: 10.8 g/dL — ABNORMAL LOW (ref 12.0–15.0)
MCH: 27.3 pg (ref 26.0–34.0)
MCHC: 31.2 g/dL (ref 30.0–36.0)
MCV: 87.4 fL (ref 78.0–100.0)
Platelets: 83 10*3/uL — ABNORMAL LOW (ref 150–400)
RBC: 3.96 MIL/uL (ref 3.87–5.11)
RDW: 14.4 % (ref 11.5–15.5)
WBC: 6.2 K/uL (ref 4.0–10.5)

## 2017-10-20 LAB — GLUCOSE, CAPILLARY
GLUCOSE-CAPILLARY: 99 mg/dL (ref 70–99)
GLUCOSE-CAPILLARY: 86 mg/dL (ref 70–99)
GLUCOSE-CAPILLARY: 89 mg/dL (ref 70–99)
GLUCOSE-CAPILLARY: 83 mg/dL (ref 70–99)
GLUCOSE-CAPILLARY: 119 mg/dL — AB (ref 70–99)
Glucose-Capillary: 72 mg/dL (ref 70–99)

## 2017-10-20 LAB — HEPATITIS B SURFACE ANTIGEN: HEP B S AG: NEGATIVE

## 2017-10-20 SURGERY — FIXATION, FRACTURE, INTERTROCHANTERIC, WITH INTRAMEDULLARY ROD
Anesthesia: General | Site: Hip | Laterality: Right

## 2017-10-20 MED ORDER — EPHEDRINE 5 MG/ML INJ
INTRAVENOUS | Status: AC
  Filled 2017-10-20: qty 10

## 2017-10-20 MED ORDER — ROCURONIUM BROMIDE 100 MG/10ML IV SOLN
INTRAVENOUS | Status: DC | PRN
  Administered 2017-10-20: 50 mg via INTRAVENOUS

## 2017-10-20 MED ORDER — ONDANSETRON HCL 4 MG PO TABS: 4.0000 mg | ORAL_TABLET | Freq: Four times a day (QID) | ORAL | Status: DC | PRN

## 2017-10-20 MED ORDER — LIDOCAINE HCL (CARDIAC) PF 100 MG/5ML IV SOSY
PREFILLED_SYRINGE | INTRAVENOUS | Status: DC | PRN
  Administered 2017-10-20: 60 mg via INTRAVENOUS

## 2017-10-20 MED ORDER — DEXAMETHASONE SODIUM PHOSPHATE 4 MG/ML IJ SOLN
INTRAMUSCULAR | Status: DC | PRN
  Administered 2017-10-20: 10 mg via INTRAVENOUS

## 2017-10-20 MED ORDER — PROPOFOL 10 MG/ML IV BOLUS
INTRAVENOUS | Status: DC | PRN
  Administered 2017-10-20: 80 mg via INTRAVENOUS

## 2017-10-20 MED ORDER — PHENYLEPHRINE 40 MCG/ML (10ML) SYRINGE FOR IV PUSH (FOR BLOOD PRESSURE SUPPORT)
PREFILLED_SYRINGE | INTRAVENOUS | Status: AC
  Filled 2017-10-20: qty 10

## 2017-10-20 MED ORDER — HYDROCODONE-ACETAMINOPHEN 5-325 MG PO TABS
1.0000 | ORAL_TABLET | ORAL | Status: DC | PRN
  Administered 2017-10-21: 2 via ORAL
  Administered 2017-10-21: 1 via ORAL
  Filled 2017-10-20: qty 2
  Filled 2017-10-20: qty 1

## 2017-10-20 MED ORDER — CEFAZOLIN SODIUM-DEXTROSE 1-4 GM/50ML-% IV SOLN
INTRAVENOUS | Status: DC | PRN
  Administered 2017-10-20: 1 g via INTRAVENOUS

## 2017-10-20 MED ORDER — PROPOFOL 10 MG/ML IV BOLUS
INTRAVENOUS | Status: AC
  Filled 2017-10-20: qty 20

## 2017-10-20 MED ORDER — MORPHINE SULFATE (PF) 2 MG/ML IV SOLN: 0.5000 mg | INTRAVENOUS | Status: DC | PRN

## 2017-10-20 MED ORDER — CEFAZOLIN SODIUM-DEXTROSE 2-4 GM/100ML-% IV SOLN: 2.0000 g | Freq: Four times a day (QID) | INTRAVENOUS | Status: DC

## 2017-10-20 MED ORDER — ONDANSETRON HCL 4 MG/2ML IJ SOLN: 4.0000 mg | Freq: Four times a day (QID) | INTRAMUSCULAR | Status: DC | PRN

## 2017-10-20 MED ORDER — PHENYLEPHRINE HCL 10 MG/ML IJ SOLN
INTRAMUSCULAR | Status: DC | PRN
  Administered 2017-10-20: 40 ug via INTRAVENOUS
  Administered 2017-10-20 (×4): 80 ug via INTRAVENOUS

## 2017-10-20 MED ORDER — HYDROCODONE-ACETAMINOPHEN 7.5-325 MG PO TABS: 1.0000 | ORAL_TABLET | ORAL | Status: DC | PRN

## 2017-10-20 MED ORDER — ROCURONIUM BROMIDE 50 MG/5ML IV SOSY
PREFILLED_SYRINGE | INTRAVENOUS | Status: AC
  Filled 2017-10-20: qty 5

## 2017-10-20 MED ORDER — PHENOL 1.4 % MT LIQD: 1.0000 | OROMUCOSAL | Status: DC | PRN

## 2017-10-20 MED ORDER — SUGAMMADEX SODIUM 200 MG/2ML IV SOLN
INTRAVENOUS | Status: DC | PRN
  Administered 2017-10-20: 200 mg via INTRAVENOUS

## 2017-10-20 MED ORDER — ONDANSETRON HCL 4 MG/2ML IJ SOLN
INTRAMUSCULAR | Status: DC | PRN
  Administered 2017-10-20: 4 mg via INTRAVENOUS

## 2017-10-20 MED ORDER — HYDROMORPHONE HCL 1 MG/ML IJ SOLN: 0.2500 mg | INTRAMUSCULAR | Status: DC | PRN

## 2017-10-20 MED ORDER — FENTANYL CITRATE (PF) 250 MCG/5ML IJ SOLN
INTRAMUSCULAR | Status: AC
  Filled 2017-10-20: qty 5

## 2017-10-20 MED ORDER — DOCUSATE SODIUM 100 MG PO CAPS
100.0000 mg | ORAL_CAPSULE | Freq: Two times a day (BID) | ORAL | Status: DC
  Administered 2017-10-21 – 2017-10-24 (×8): 100 mg via ORAL
  Filled 2017-10-20 (×8): qty 1

## 2017-10-20 MED ORDER — LIDOCAINE 2% (20 MG/ML) 5 ML SYRINGE
INTRAMUSCULAR | Status: AC
  Filled 2017-10-20: qty 5

## 2017-10-20 MED ORDER — MEPERIDINE HCL 50 MG/ML IJ SOLN: 6.2500 mg | INTRAMUSCULAR | Status: DC | PRN

## 2017-10-20 MED ORDER — 0.9 % SODIUM CHLORIDE (POUR BTL) OPTIME
TOPICAL | Status: DC | PRN
  Administered 2017-10-20: 1000 mL

## 2017-10-20 MED ORDER — ACETAMINOPHEN 325 MG PO TABS: 325.0000 mg | ORAL_TABLET | Freq: Four times a day (QID) | ORAL | Status: DC | PRN

## 2017-10-20 MED ORDER — METOCLOPRAMIDE HCL 5 MG/ML IJ SOLN: 5.0000 mg | Freq: Three times a day (TID) | INTRAMUSCULAR | Status: DC | PRN

## 2017-10-20 MED ORDER — WARFARIN SODIUM 5 MG PO TABS
5.0000 mg | ORAL_TABLET | Freq: Once | ORAL | Status: AC
  Administered 2017-10-20: 5 mg via ORAL
  Filled 2017-10-20: qty 1

## 2017-10-20 MED ORDER — WARFARIN - PHARMACIST DOSING INPATIENT
Freq: Every day | Status: DC
  Administered 2017-10-22 – 2017-10-24 (×2)

## 2017-10-20 MED ORDER — ONDANSETRON HCL 4 MG/2ML IJ SOLN: 4.0000 mg | Freq: Once | INTRAMUSCULAR | Status: DC | PRN

## 2017-10-20 MED ORDER — SUCCINYLCHOLINE CHLORIDE 200 MG/10ML IV SOSY
PREFILLED_SYRINGE | INTRAVENOUS | Status: AC
  Filled 2017-10-20: qty 10

## 2017-10-20 MED ORDER — METOCLOPRAMIDE HCL 5 MG PO TABS: 5.0000 mg | ORAL_TABLET | Freq: Three times a day (TID) | ORAL | Status: DC | PRN

## 2017-10-20 MED ORDER — FENTANYL CITRATE (PF) 100 MCG/2ML IJ SOLN
INTRAMUSCULAR | Status: DC | PRN
  Administered 2017-10-20: 50 ug via INTRAVENOUS
  Administered 2017-10-20: 100 ug via INTRAVENOUS

## 2017-10-20 MED ORDER — MENTHOL 3 MG MT LOZG: 1.0000 | LOZENGE | OROMUCOSAL | Status: DC | PRN

## 2017-10-20 SURGICAL SUPPLY — 45 items
BLADE SURG 15 STRL LF DISP TIS (BLADE) ×1 IMPLANT
BLADE SURG 15 STRL SS (BLADE) ×2
BNDG GAUZE ELAST 4 BULKY (GAUZE/BANDAGES/DRESSINGS) ×3 IMPLANT
COVER PERINEAL POST (MISCELLANEOUS) ×3 IMPLANT
COVER SURGICAL LIGHT HANDLE (MISCELLANEOUS) ×3 IMPLANT
DRAPE STERI IOBAN 125X83 (DRAPES) ×3 IMPLANT
DRSG AQUACEL AG ADV 3.5X10 (GAUZE/BANDAGES/DRESSINGS) ×3 IMPLANT
DRSG MEPILEX BORDER 4X4 (GAUZE/BANDAGES/DRESSINGS) IMPLANT
DRSG MEPILEX BORDER 4X8 (GAUZE/BANDAGES/DRESSINGS) ×3 IMPLANT
DRSG PAD ABDOMINAL 8X10 ST (GAUZE/BANDAGES/DRESSINGS) ×6 IMPLANT
DURAPREP 26ML APPLICATOR (WOUND CARE) ×3 IMPLANT
ELECT REM PT RETURN 9FT ADLT (ELECTROSURGICAL) ×3
ELECTRODE REM PT RTRN 9FT ADLT (ELECTROSURGICAL) ×1 IMPLANT
FACESHIELD WRAPAROUND (MASK) ×3 IMPLANT
GAUZE XEROFORM 5X9 LF (GAUZE/BANDAGES/DRESSINGS) ×3 IMPLANT
GLOVE BIO SURGEON STRL SZ8 (GLOVE) ×3 IMPLANT
GLOVE BIOGEL PI IND STRL 8 (GLOVE) ×1 IMPLANT
GLOVE BIOGEL PI INDICATOR 8 (GLOVE) ×2
GLOVE ORTHO TXT STRL SZ7.5 (GLOVE) ×3 IMPLANT
GOWN STRL REUS W/ TWL LRG LVL3 (GOWN DISPOSABLE) ×1 IMPLANT
GOWN STRL REUS W/ TWL XL LVL3 (GOWN DISPOSABLE) ×2 IMPLANT
GOWN STRL REUS W/TWL LRG LVL3 (GOWN DISPOSABLE) ×2
GOWN STRL REUS W/TWL XL LVL3 (GOWN DISPOSABLE) ×4
GUIDE PIN 3.2X343 (PIN) ×2
GUIDE PIN 3.2X343MM (PIN) ×4
KIT BASIN OR (CUSTOM PROCEDURE TRAY) ×3 IMPLANT
KIT TURNOVER KIT B (KITS) ×3 IMPLANT
LINER BOOT UNIVERSAL DISP (MISCELLANEOUS) ×3 IMPLANT
MANIFOLD NEPTUNE II (INSTRUMENTS) ×3 IMPLANT
NAIL TRIGEN RT 10MMX36CM-125 (Nail) ×3 IMPLANT
NS IRRIG 1000ML POUR BTL (IV SOLUTION) ×3 IMPLANT
PACK GENERAL/GYN (CUSTOM PROCEDURE TRAY) ×3 IMPLANT
PAD ARMBOARD 7.5X6 YLW CONV (MISCELLANEOUS) ×6 IMPLANT
PAD CAST 4YDX4 CTTN HI CHSV (CAST SUPPLIES) ×2 IMPLANT
PADDING CAST COTTON 4X4 STRL (CAST SUPPLIES) ×6
PIN GUIDE 3.2X343MM (PIN) ×2 IMPLANT
SCREW LAG COMPR KIT 90/85 (Screw) ×3 IMPLANT
STAPLER VISISTAT 35W (STAPLE) ×3 IMPLANT
SUT VIC AB 0 CT1 27 (SUTURE) ×6
SUT VIC AB 0 CT1 27XBRD ANBCTR (SUTURE) ×2 IMPLANT
SUT VIC AB 2-0 CT1 27 (SUTURE) ×6
SUT VIC AB 2-0 CT1 TAPERPNT 27 (SUTURE) ×2 IMPLANT
TOWEL OR 17X24 6PK STRL BLUE (TOWEL DISPOSABLE) ×3 IMPLANT
TOWEL OR 17X26 10 PK STRL BLUE (TOWEL DISPOSABLE) ×3 IMPLANT
WATER STERILE IRR 1000ML POUR (IV SOLUTION) ×3 IMPLANT

## 2017-10-20 NOTE — Transfer of Care (Signed)
Immediate Anesthesia Transfer of Care Note  Patient: Vanessa Webb  Procedure(s) Performed: INTRAMEDULLARY (IM) NAIL INTERTROCHANTRIC FEMORAL NAIL (Right Hip)  Patient Location: PACU  Anesthesia Type:General  Level of Consciousness: awake, alert  and oriented  Airway & Oxygen Therapy: Patient Spontanous Breathing and Patient connected to nasal cannula oxygen  Post-op Assessment: Report given to RN, Post -op Vital signs reviewed and stable and Patient moving all extremities  Post vital signs: Reviewed and stable  Last Vitals:  Vitals Value Taken Time  BP 157/50 10/20/2017  5:01 PM  Temp 36.3 C 10/20/2017  5:00 PM  Pulse 73 10/20/2017  5:10 PM  Resp 19 10/20/2017  5:10 PM  SpO2 100 % 10/20/2017  5:10 PM  Vitals shown include unvalidated device data.  Last Pain:  Vitals:   10/20/17 1013  TempSrc:   PainSc: 10-Worst pain ever      Patients Stated Pain Goal: 2 (93/55/21 7471)  Complications: No apparent anesthesia complications

## 2017-10-20 NOTE — Progress Notes (Signed)
ANTICOAGULATION CONSULT NOTE - Initial Consult  Pharmacy Consult for Warfarin Indication: atrial fibrillation  Allergies  Allergen Reactions  . Sulfa Antibiotics Nausea Only    nausea   Patient Measurements: Height: 5\' 4"  (162.6 cm) Weight: 113 lb 1.5 oz (51.3 kg) IBW/kg (Calculated) : 54.7 Heparin Dosing Weight: 51 kg  Vital Signs: Temp: 98.2 F (36.8 C) (08/25 0921) Temp Source: Oral (08/25 0921) BP: 123/57 (08/25 0921) Pulse Rate: 77 (08/25 0921)  Labs: Recent Labs    10/19/17 1704 10/20/17 0505  HGB 12.1 10.8*  HCT 38.8 34.6*  PLT 98* 83*  APTT 33  --   LABPROT 16.9* 18.0*  INR 1.39 1.50  CREATININE 6.06* 3.54*   Estimated Creatinine Clearance: 11.5 mL/min (A) (by C-G formula based on SCr of 3.54 mg/dL (H)).  Assessment: 4 yoF presenting s/p mechanical fall with R hip fracture, PMH afib on warfarin PTA. Pharmacy consulted to resume warfarin dosing. INR below goal at 1.5 this AM. Hgb 12.1>>10.7, pltc 83. No bleeding noted.  PTA warfarin dose: 2.5mg  TTSa, 5mg  MWF (last dose 8/23 PTA, admit INR 1.5)  Goal of Therapy:  INR 2-3 Monitor platelets by anticoagulation protocol: Yes   Plan:  Warfarin 5mg  PO x1 Daily INR  Monitor s/sx of bleeding  Erin N. Gerarda Fraction, PharmD PGY2 Infectious Diseases Pharmacy Resident Phone: [phone] 10/20/2017,5:01 PM

## 2017-10-20 NOTE — Anesthesia Preprocedure Evaluation (Signed)
Anesthesia Evaluation  Patient identified by MRN, date of birth, ID band Patient awake    Reviewed: Allergy & Precautions, NPO status , Patient's Chart, lab work & pertinent test results  Airway Mallampati: I  TM Distance: >3 FB Neck ROM: Full    Dental   Pulmonary    Pulmonary exam normal        Cardiovascular hypertension, Pt. on medications Normal cardiovascular exam     Neuro/Psych Depression    GI/Hepatic GERD  Medicated and Controlled,  Endo/Other  diabetes, Type 2, Insulin Dependent  Renal/GU ESRF and DialysisRenal disease     Musculoskeletal   Abdominal   Peds  Hematology   Anesthesia Other Findings   Reproductive/Obstetrics                             Anesthesia Physical Anesthesia Plan  ASA: III  Anesthesia Plan: General   Post-op Pain Management:    Induction: Intravenous  PONV Risk Score and Plan: 3 and Ondansetron, Midazolam and Treatment may vary due to age or medical condition  Airway Management Planned: Oral ETT  Additional Equipment:   Intra-op Plan:   Post-operative Plan: Extubation in OR  Informed Consent: I have reviewed the patients History and Physical, chart, labs and discussed the procedure including the risks, benefits and alternatives for the proposed anesthesia with the patient or authorized representative who has indicated his/her understanding and acceptance.     Plan Discussed with: CRNA and Surgeon  Anesthesia Plan Comments:         Anesthesia Quick Evaluation

## 2017-10-20 NOTE — Progress Notes (Signed)
Patient ID: Vanessa Webb, female   DOB: 1945-12-11, 72 y.o.   MRN: [id number] I have spoken to the family at the bedside and they understand that are are proceeding to surgery today for fixation of her right hip fracture.  The risks and benefits have been discussed in detail.  They also understand our delay due to other emergencies.

## 2017-10-20 NOTE — Progress Notes (Signed)
PHARMACY NOTE:  ANTIMICROBIAL RENAL DOSAGE ADJUSTMENT  Current antimicrobial regimen includes a mismatch between antimicrobial dosage and estimated renal function.  As per policy approved by the Pharmacy & Therapeutics and Medical Executive Committees, the antimicrobial dosage will be adjusted accordingly.  Current antimicrobial dosage:  Ancef 2gm IV q6h x2 doses  Indication: surgical prophylaxis  Renal Function:  Estimated Creatinine Clearance: 11.5 mL/min (A) (by C-G formula based on SCr of 3.54 mg/dL (H)). [x]      On intermittent HD, scheduled:TTS []      On CRRT    Antimicrobial dosage has been changed to:  Ancef given at pm today. No further dosing needed as this dose will cover for at least 24hrs  Additional comments:   Thank you for allowing pharmacy to be a part of this patient's care.  Hildred Laser, PharmD Clinical Pharmacist Please check Amion for pharmacy contact number

## 2017-10-20 NOTE — Op Note (Signed)
NAME: Vanessa Webb, Vanessa Webb MEDICAL RECORD DS:[id number] ACCOUNT [id number] DATE OF BIRTH:02-07-1946 FACILITY: [facility] LOCATION: [facility] PHYSICIAN:Bulmaro Feagans Kerry Fort, MD  OPERATIVE REPORT  DATE OF PROCEDURE:  10/20/2017  PREOPERATIVE DIAGNOSIS:  Right hip intertrochanteric fracture with displacement.  POSTOPERATIVE DIAGNOSIS:  Right hip intertrochanteric fracture with displacement.  PROCEDURE:  Intramedullary nail and hip screw placement of right hip.  IMPLANTS:  [employer] right InterTAN femoral nail measuring 10 x 34 with a 90/85 mm dual lag screw compression screw construct.  SURGEON:  Lind Guest. Ninfa Linden, MD  ANESTHESIA:  General.  ANTIBIOTICS:  One gram IV Ancef.  ESTIMATED BLOOD LOSS:  Less than 100 mL.  COMPLICATIONS:  None.  INDICATIONS:  The patient is a very frail 72 year old female on dialysis, who sustained a mechanical fall early yesterday.  She was seen in an outside hospital and transferred to [facility] for definitive care of a displaced right hip intertrochanteric  fracture.  I did note that there is a basicervical component of this involving femoral neck.   She was admitted to the Medicine Service last evening and she had dialysis as well.  She now presents having been medically cleared for surgery.  I talked to  her and her family about this in detail.  Given her frail condition, she is still at high risk for any type of surgery, no matter what, but the goal of surgery is to decrease her pain and improve her quality of life which they understand as well.  DESCRIPTION OF PROCEDURE:  After informed consent was obtained and appropriate right hip was marked.  She was brought to the operating room where general anesthesia was obtained while she was on her stretcher.  She was then placed supine on the fracture  table with the right leg in line skeletal traction and internal rotation with adduction.  A perineal post was placed as well as a left hip flexed and  adducted out of the field in a well-padded well leg holder.  We then assessed the fracture under direct  fluoroscopy and it did reduce it.  We then chose our femoral nail, keeping it sterile in the box choosing a 10 x 34 right InterTAN femoral nail from [employer].  This was passed off the back table and opened sterilely.  We then prepped her right  hip with DuraPrep and sterile drapes.  Time-out was called to identify correct patient, correct right hip.  I then made a small incision just proximal to the greater trochanter and dissected down to the greater trochanter.  I placed a temporary pin in an  antegrade fashion, traversing the center position of the trochanteric area down to past the lesser trochanter.  I then used initiating reamer to open the femoral canal and removed the guide pin.  I then placed the long femoral nail without needing to  ream down toward the knee.  This placement was verified again under direct fluoroscopy.  Using separate lateral incision and the outrigger guide, we lined up a guide pin into the femoral head and neck region getting a center position.  We took a  measurement off of this and we chose a 90/85 mm dual lag screw, compression screw construct.  We then drilled my depth for these in place and without difficulty.  We let some traction off the bed as we compressed the fracture.  We then put the hip  through internal and external rotation under direct fluoroscopy and it moved in the unit.  We were  pleased with the placement of the hardware.  We then removed the outrigger guide.  We irrigated the wounds with normal saline solution and closed deep  tissue with 0 Vicryl followed by 2-0 Vicryl subcutaneous tissue and interrupted staples on the skin.  Xeroform and Aquacel dressing was applied.    She was taken off the fracture table, awakened, extubated, and taken to recovery room in stable condition.  All final counts were correct.  There were no complications  noted.  AN/NUANCE  D:10/20/2017 T:10/20/2017 JOB:[id number]

## 2017-10-20 NOTE — Brief Op Note (Signed)
10/20/2017  4:42 PM  PATIENT:  Vanessa Webb  72 y.o. female  PRE-OPERATIVE DIAGNOSIS:  intertroch hip fracture, right  POST-OPERATIVE DIAGNOSIS:  intertroch hip fracture, right  PROCEDURE:  Procedure(s): INTRAMEDULLARY (IM) NAIL INTERTROCHANTRIC FEMORAL NAIL (Right)  SURGEON:  Surgeon(s) and Role:    Mcarthur Rossetti, MD - Primary  ANESTHESIA:   general  EBL:  50 mL   COUNTS:  YES  DICTATION: .Other Dictation: Dictation Number [id number]  PLAN OF CARE: Admit to inpatient   PATIENT DISPOSITION:  PACU - hemodynamically stable.   Delay start of Pharmacological VTE agent (>24hrs) due to surgical blood loss or risk of bleeding: no

## 2017-10-20 NOTE — Anesthesia Procedure Notes (Signed)
Procedure Name: Intubation Date/Time: 10/20/2017 3:46 PM Performed by: Caliber Landess T, CRNA Pre-anesthesia Checklist: Patient identified, Emergency Drugs available, Suction available and Patient being monitored Patient Re-evaluated:Patient Re-evaluated prior to induction Oxygen Delivery Method: Circle system utilized Preoxygenation: Pre-oxygenation with 100% oxygen Induction Type: IV induction Ventilation: Mask ventilation without difficulty Laryngoscope Size: Mac and 3 Grade View: Grade III Tube type: Oral Tube size: 7.0 mm Number of attempts: 2 Airway Equipment and Method: Patient positioned with wedge pillow and Stylet Placement Confirmation: ETT inserted through vocal cords under direct vision,  positive ETCO2 and breath sounds checked- equal and bilateral Secured at: 21 cm Dental Injury: Teeth and Oropharynx as per pre-operative assessment  Difficulty Due To: Difficult Airway- due to anterior larynx and Difficulty was anticipated

## 2017-10-20 NOTE — Progress Notes (Signed)
[facility] Progress Note  Subjective: no new /co, awaiting trip to OR. No problems on HD last night.   Vitals:   10/20/17 0921 10/20/17 1700 10/20/17 1715 10/20/17 1730  BP: (!) 123/57 (!) 157/50 (!) 165/50 (!) 161/49  Pulse: 77 73 74 75  Resp: 17 16 15 19   Temp: 98.2 F (36.8 C) (!) 97.3 F (36.3 C)    TempSrc: Oral     SpO2: 94% 100% 100% 100%  Weight:      Height:        Inpatient medications: . amLODipine  2.5 mg Oral Q M,W,F,Su-1800  . atorvastatin  20 mg Oral QHS  . Chlorhexidine Gluconate Cloth  6 each Topical Q0600  . docusate sodium  100 mg Oral BID  . insulin aspart  0-5 Units Subcutaneous QHS  . insulin aspart  0-9 Units Subcutaneous TID WC  . metoprolol tartrate  25 mg Oral BID  . sodium chloride flush  3 mL Intravenous Q12H  . warfarin  5 mg Oral ONCE-1800  . Warfarin - Pharmacist Dosing Inpatient   Does not apply q1800   . sodium chloride    . sodium chloride    .  ceFAZolin (ANCEF) IV     sodium chloride, sodium chloride, acetaminophen **OR** acetaminophen, cloNIDine, diphenoxylate-atropine, hydrALAZINE, HYDROcodone-acetaminophen, HYDROcodone-acetaminophen, HYDROmorphone (DILAUDID) injection, lidocaine-prilocaine, menthol-cetylpyridinium **OR** phenol, metoCLOPramide **OR** metoCLOPramide (REGLAN) injection, morphine injection, ondansetron **OR** ondansetron (ZOFRAN) IV, ondansetron **OR** ondansetron (ZOFRAN) IV, oxyCODONE, pentafluoroprop-tetrafluoroeth, polyethylene glycol, sodium chloride flush  Iron/TIBC/Ferritin/ %Sat No results found for: IRON, TIBC, FERRITIN, IRONPCTSAT  Exam: Gen alert, elderly WF no distress, lying flat No jvd or bruits Chest clear bilat RRR no MRG Abd soft ntnd no mass or ascites +bs GU defer MS no joint effusions or deformity Ext no LE or UE edema, no wounds or ulcers Neuro is alert, Ox 3 , nf RUA AVF+bruit    Home meds:  - amlodipine 2.5 on mwf and Sunday/ metoprolol tartrate 25 bid  - warfarin 2.5mg   hs nonHD days and 5mg  hs mwf  - insulin glargine 3-15u hs  - diphyoxylate-atropine 1 qid prn diarrhea/ atorvastatin 20 hs  Dialysis: [facility]  3h 61min and dry wt 54kg per patient     Impression: 1. R hip fracture - after fall 8/24. Going to OR today 2. ESRD - on HD TTS.  Had dialysis last night. Stable vol / lytes.  3. Volume - is under dry wt, stable vol 4. Afib paroxysmal - per primary 5. HTN - cont home BP meds x 2, added prn IV hydralazine and po clonidine  6. DM2 - per primary   Plan - as above   Kelly Splinter MD [facility] pager [phone]   10/20/2017, 9: 05 AM   Recent Labs  Lab 10/19/17 1704 10/20/17 0505  NA 138 139  K 5.5* 4.5  CL 99 99  CO2 27 29  GLUCOSE 132* 98  BUN 28* 12  CREATININE 6.06* 3.54*  CALCIUM 8.3* 8.2*  ALBUMIN 3.2*  --   INR 1.39 1.50   Recent Labs  Lab 10/19/17 1704  AST 127*  ALT 75*  ALKPHOS 91  BILITOT 1.2  PROT 7.1   Recent Labs  Lab 10/19/17 1704 10/20/17 0505  WBC 8.0 6.2  HGB 12.1 10.8*  HCT 38.8 34.6*  MCV 87.2 87.4  PLT 98* 83*

## 2017-10-20 NOTE — Progress Notes (Signed)
PROGRESS NOTE    Vanessa Webb  IPJ:[id number] DOB: 1945-08-02 DOA: 10/19/2017 PCP: Monico Blitz, MD   Brief Narrative:  72 year old with past medical history relevant for chronic atrial fibrillation on warfarin, ESRD on Tuesday/Thursday/Saturday dialysis in [place] at [place], hypertension, hyperlipidemia, type 2 diabetes on insulin who presented after mechanical fall at dialysis with right hip fracture.   Assessment & Plan:   Principal Problem:   Closed right hip fracture, initial encounter ([place]) Active Problems:   End stage renal disease ([place])   Atherosclerosis of native arteries of the extremities with ulceration ([place])   DM (diabetes mellitus) with complications ([facility])   Atrial fibrillation, unspecified type ([place])   HTN (hypertension)   HLD (hyperlipidemia)   S/P right hip fracture   #) Right hip fracture: In setting of mechanical fall. - Plan for operating room today on 10/20/2017 -Holding home warfarin, INR is 1.5 -Pain control and PT afterwards  #) Right foot pain: Reported outside hospital noted possible second proximal phalanx fracture. -Foot x-ray shows no acute fracture -Ortho consult per above  #) ESRD on Tuesday/Thursday/Saturday dialysis: Patient is dialyzed at [facility] in [facility]. -Nephrology consult  #) Nonobstructing coronary artery disease status post NSTEMI: Patient reports remote history with cardiac catheterization that had no need for intervention. -Continue atorvastatin 20 mg nightly - Continue metoprolol tartrate 37.5 mg twice daily  #) Hypertension/hyperlipidemia: -Continue statin -Continue beta-blocker -Continue amlodipine 2.5 mg on Monday, Wednesday, Friday, Sunday  #) Type 2 diabetes: Only on sliding scale glargine at home -Sensitive sliding scale, AC at bedtime  #) Chronic atrial fibrillation: -Hold home warfarin, restart after surgery  Fluids: Tolerating p.o. Electrolytes s: Monitor and supplement  nutrition:  Renal/carb restricted diet  Prophylaxis: SCDs  Disposition: Pending surgery and PT consult  Full code   Consultants:   Orthopedic surgery  Nephrology  Procedures:   10/20/2017 pending surgery for hip  Antimicrobials:   Perioperative antibiotics   Subjective: Patient reports that her hip pain is not quite so bothersome however her foot pain is fairly significant.  She denies any nausea, vomiting, diarrhea, chest pain.  She did have dialysis yesterday without any complications.  Objective: Vitals:   10/19/17 2324 10/19/17 2353 10/20/17 0410 10/20/17 0921  BP: (!) 166/54 (!) 178/63 (!) 163/51 (!) 123/57  Pulse: 85 84 79 77  Resp: (!) 22 16 16 17   Temp: 98 F (36.7 C) 98.2 F (36.8 C) 98.5 F (36.9 C) 98.2 F (36.8 C)  TempSrc: Oral Oral Oral Oral  SpO2: 95% 94% (!) 89% 94%  Weight: 51.3 kg     Height:        Intake/Output Summary (Last 24 hours) at 10/20/2017 1103 Last data filed at 10/20/2017 0600 Gross per 24 hour  Intake 0 ml  Output 0 ml  Net 0 ml   Filed Weights   10/19/17 1541 10/19/17 1934 10/19/17 2324  Weight: 51 kg 51.3 kg 51.3 kg    Examination:  General exam: Appears calm and comfortable  Respiratory system: Clear to auscultation. Respiratory effort normal. Cardiovascular system: Irregularly irregular, 2 out of 6 referred systolic murmur Gastrointestinal system: Abdomen is nondistended, soft and nontender. No organomegaly or masses felt. Normal bowel sounds heard. Central nervous system: Alert and oriented. No focal neurological deficits. Extremities: No lower extremity edema, right upper extremity fistula with palpable thrill and audible bruit.  Intact distal pulses of her right lower extremity, pain with movement, pain on palpation of dorsum of right foot with  no point tenderness Skin: No rashes over visible skin Psychiatry: Judgement and insight appear normal. Mood & affect appropriate.     Data Reviewed: I have personally reviewed  following labs and imaging studies  CBC: Recent Labs  Lab 10/19/17 1704 10/20/17 0505  WBC 8.0 6.2  HGB 12.1 10.8*  HCT 38.8 34.6*  MCV 87.2 87.4  PLT 98* 83*   Basic Metabolic Panel: Recent Labs  Lab 10/19/17 1704 10/20/17 0505  NA 138 139  K 5.5* 4.5  CL 99 99  CO2 27 29  GLUCOSE 132* 98  BUN 28* 12  CREATININE 6.06* 3.54*  CALCIUM 8.3* 8.2*  MG 1.9  --    GFR: Estimated Creatinine Clearance: 11.5 mL/min (A) (by C-G formula based on SCr of 3.54 mg/dL (H)). Liver Function Tests: Recent Labs  Lab 10/19/17 1704  AST 127*  ALT 75*  ALKPHOS 91  BILITOT 1.2  PROT 7.1  ALBUMIN 3.2*   No results for input(s): LIPASE, AMYLASE in the last 168 hours. No results for input(s): AMMONIA in the last 168 hours. Coagulation Profile: Recent Labs  Lab 10/19/17 1704 10/20/17 0505  INR 1.39 1.50   Cardiac Enzymes: No results for input(s): CKTOTAL, CKMB, CKMBINDEX, TROPONINI in the last 168 hours. BNP (last 3 results) No results for input(s): PROBNP in the last 8760 hours. HbA1C: No results for input(s): HGBA1C in the last 72 hours. CBG: Recent Labs  Lab 10/19/17 1532 10/19/17 2351 10/20/17 0757  GLUCAP 106* 70 99   Lipid Profile: No results for input(s): CHOL, HDL, LDLCALC, TRIG, CHOLHDL, LDLDIRECT in the last 72 hours. Thyroid Function Tests: No results for input(s): TSH, T4TOTAL, FREET4, T3FREE, THYROIDAB in the last 72 hours. Anemia Panel: No results for input(s): VITAMINB12, FOLATE, FERRITIN, TIBC, IRON, RETICCTPCT in the last 72 hours. Sepsis Labs: No results for input(s): PROCALCITON, LATICACIDVEN in the last 168 hours.  Recent Results (from the past 240 hour(s))  Surgical pcr screen     Status: Abnormal   Collection Time: 10/19/17  4:01 PM  Result Value Ref Range Status   MRSA, PCR NEGATIVE NEGATIVE Final   Staphylococcus aureus POSITIVE (A) NEGATIVE Final    Comment: (NOTE) The Xpert SA Assay (FDA approved for NASAL specimens in patients 12 years  of age and older), is one component of a comprehensive surveillance program. It is not intended to diagnose infection nor to guide or monitor treatment. Performed at [facility], [place] [address] [place], [place] [address]          Radiology Studies: Dg Foot Complete Right  Result Date: 10/19/2017 CLINICAL DATA:  Fall, medial right foot pain EXAM: RIGHT FOOT COMPLETE - 3+ VIEW COMPARISON:  None. FINDINGS: No fracture or dislocation is seen. The joint spaces are preserved. Visualized soft tissues are within normal limits. Vascular calcifications. IMPRESSION: Negative. Electronically Signed   By: Julian Hy M.D.   On: 10/19/2017 16:55   Dg Hip Unilat With Pelvis 2-3 Views Right  Result Date: 10/19/2017 CLINICAL DATA:  Fall, right hip pain EXAM: DG HIP (WITH OR WITHOUT PELVIS) 2-3V RIGHT COMPARISON:  [facility] right hip radiographs dated 10/19/2017 at 0725 hours FINDINGS: Intertrochanteric right hip fracture. Mild with foreshortening with varus angulation. Bilateral hip joint spaces are symmetric. Visualized bony pelvis appears intact. IMPRESSION: Intertrochanteric right hip fracture, as above. Electronically Signed   By: Julian Hy M.D.   On: 10/19/2017 16:54        Scheduled Meds: . amLODipine  2.5 mg  Oral Q M,W,F,Su-1800  . atorvastatin  20 mg Oral QHS  . Chlorhexidine Gluconate Cloth  6 each Topical Q0600  . insulin aspart  0-5 Units Subcutaneous QHS  . insulin aspart  0-9 Units Subcutaneous TID WC  . metoprolol tartrate  25 mg Oral BID  . sodium chloride flush  3 mL Intravenous Q12H   Continuous Infusions: . sodium chloride    . sodium chloride       LOS: 1 day    Time spent: [place], MD [facility]  If 7PM-7AM, please contact night-coverage [email or web address] Password [facility] 10/20/2017, 11:03 AM

## 2017-10-20 NOTE — Anesthesia Postprocedure Evaluation (Signed)
Anesthesia Post Note  Patient: Vanessa Webb  Procedure(s) Performed: INTRAMEDULLARY (IM) NAIL INTERTROCHANTRIC FEMORAL NAIL (Right Hip)     Patient location during evaluation: PACU Anesthesia Type: General Level of consciousness: awake and alert Pain management: pain level controlled Vital Signs Assessment: post-procedure vital signs reviewed and stable Respiratory status: spontaneous breathing, nonlabored ventilation, respiratory function stable and patient connected to nasal cannula oxygen Cardiovascular status: blood pressure returned to baseline and stable Postop Assessment: no apparent nausea or vomiting Anesthetic complications: no    Last Vitals:  Vitals:   10/20/17 1715 10/20/17 1730  BP: (!) 165/50 (!) 161/49  Pulse: 74 75  Resp: 15 19  Temp:    SpO2: 100% 100%    Last Pain:  Vitals:   10/20/17 1730  TempSrc:   PainSc: 0-No pain                 Dustine Bertini DAVID

## 2017-10-21 ENCOUNTER — Encounter (HOSPITAL_COMMUNITY): Payer: Self-pay | Admitting: Orthopaedic Surgery

## 2017-10-21 LAB — CBC
HCT: 34.3 % — ABNORMAL LOW (ref 36.0–46.0)
Hemoglobin: 10.7 g/dL — ABNORMAL LOW (ref 12.0–15.0)
MCH: 27.6 pg (ref 26.0–34.0)
MCHC: 31.2 g/dL (ref 30.0–36.0)
MCV: 88.4 fL (ref 78.0–100.0)
Platelets: 82 10*3/uL — ABNORMAL LOW (ref 150–400)
RBC: 3.88 MIL/uL (ref 3.87–5.11)
RDW: 14.6 % (ref 11.5–15.5)
WBC: 7.8 10*3/uL (ref 4.0–10.5)

## 2017-10-21 LAB — RENAL FUNCTION PANEL
ALBUMIN: 2.7 g/dL — AB (ref 3.5–5.0)
ANION GAP: 15 (ref 5–15)
BUN: 31 mg/dL — ABNORMAL HIGH (ref 8–23)
CALCIUM: 7.7 mg/dL — AB (ref 8.9–10.3)
CO2: 22 mmol/L (ref 22–32)
Chloride: 100 mmol/L (ref 98–111)
Creatinine, Ser: 5.24 mg/dL — ABNORMAL HIGH (ref 0.44–1.00)
GFR calc Af Amer: 9 mL/min — ABNORMAL LOW (ref >60)
GFR calc non Af Amer: 7 mL/min — ABNORMAL LOW (ref >60)
GLUCOSE: 169 mg/dL — AB (ref 70–99)
PHOSPHORUS: 7.9 mg/dL — AB (ref 2.5–4.6)
Potassium: 4.8 mmol/L (ref 3.5–5.1)
SODIUM: 137 mmol/L (ref 135–145)

## 2017-10-21 LAB — GLUCOSE, CAPILLARY
GLUCOSE-CAPILLARY: 153 mg/dL — AB (ref 70–99)
Glucose-Capillary: 144 mg/dL — ABNORMAL HIGH (ref 70–99)
Glucose-Capillary: 189 mg/dL — ABNORMAL HIGH (ref 70–99)
Glucose-Capillary: 150 mg/dL — ABNORMAL HIGH (ref 70–99)

## 2017-10-21 LAB — PROTIME-INR
INR: 1.55
Prothrombin Time: 18.4 seconds — ABNORMAL HIGH (ref 11.4–15.2)

## 2017-10-21 MED ORDER — OXYCODONE HCL 5 MG PO TABS
5.0000 mg | ORAL_TABLET | ORAL | Status: DC | PRN
  Administered 2017-10-21 – 2017-10-24 (×7): 5 mg via ORAL
  Filled 2017-10-21 (×7): qty 1

## 2017-10-21 MED ORDER — CALCIUM ACETATE (PHOS BINDER) 667 MG PO CAPS
1334.0000 mg | ORAL_CAPSULE | Freq: Three times a day (TID) | ORAL | Status: DC
  Administered 2017-10-21 – 2017-10-24 (×9): 1334 mg via ORAL
  Filled 2017-10-21 (×8): qty 2

## 2017-10-21 MED ORDER — MUPIROCIN 2 % EX OINT
1.0000 "application " | TOPICAL_OINTMENT | Freq: Two times a day (BID) | CUTANEOUS | Status: DC
  Administered 2017-10-21 – 2017-10-24 (×6): 1 via NASAL
  Filled 2017-10-21: qty 22

## 2017-10-21 MED ORDER — DIPHENHYDRAMINE HCL 25 MG PO CAPS
25.0000 mg | ORAL_CAPSULE | ORAL | Status: DC | PRN
  Administered 2017-10-21: 25 mg via ORAL
  Filled 2017-10-21: qty 1

## 2017-10-21 MED ORDER — WARFARIN SODIUM 5 MG PO TABS
5.0000 mg | ORAL_TABLET | Freq: Once | ORAL | Status: AC
  Administered 2017-10-21: 5 mg via ORAL
  Filled 2017-10-21: qty 1

## 2017-10-21 MED ORDER — CHLORHEXIDINE GLUCONATE CLOTH 2 % EX PADS
6.0000 | MEDICATED_PAD | Freq: Every day | CUTANEOUS | Status: DC
  Administered 2017-10-24: 6 via TOPICAL

## 2017-10-21 NOTE — Progress Notes (Signed)
Initial Nutrition Assessment  DOCUMENTATION CODES:   Not applicable  INTERVENTION:  - Continue to encourage PO intakes.   NUTRITION DIAGNOSIS:   Inadequate oral intake related to decreased appetite as evidenced by per patient/family report.  GOAL:   Patient will meet greater than or equal to 90% of their needs  MONITOR:   PO intake, Weight trends, Labs, I & O's  REASON FOR ASSESSMENT:   Malnutrition Screening Tool  ASSESSMENT:   72 year old with past medical history relevant for chronic atrial fibrillation on warfarin, ESRD on Tuesday/Thursday/Saturday dialysis in [place] at [place], hypertension, hyperlipidemia, type 2 diabetes on insulin who presented after mechanical fall at dialysis with right hip fracture.  BMI indicates normal weight. Per chart review, patient consumed 25% of lunch today. Patient and husband at bedside and report that lunch was fruit and jello. Patient states that for breakfast she received grits, ice cream, and coffee but that items were not appealing or at the right temperature so she did not eat well. She states that she has had a bad day and eating has not been a priority. It was unclear how appetite had been PTA and unable to obtain a diet recall despite asking in several ways.   Patient is offered protein supplements at dialysis but will only accept protein bars. She refuses any ONS available here. She denies abdominal pain or nausea. She denies any questions or concerns about nutrition. She is seen weekly or every other week by RD at HD. She states that a long time ago she was told to follow a 1200 kcal/day diet and that other than that she only monitors vitamin K intake, no other restrictions at home.   Patient and husband deny weight changes and state that dry weight is 54 lb (119 lb). Later in conversation her husband mentions that her body weight has changed recently and that this was not being accounted for at HD. Per chart review,  current weight is 113 lb and weight on 6/11 was 128 lb. This indicates 15 lb weight loss (11.7% body weight loss in 2 months).   Medications reviewed; 1334 mg Phoslo TID, 100 mg Colace BID, sliding scale Novolog.  Labs reviewed; CBGs: 144 and 153 mg/dL today, BUN: 31 mg/dL, creatinine: 5.24 mg/dL, Ca: 7.7 mg/dL, Phos: 7.9 mg/dL, GFR: 7 mL/min.    NUTRITION - FOCUSED PHYSICAL EXAM:  Patient refused NFPE at this time; will attempt at follow-up, if possible.   Diet Order:   Diet Order            Diet renal/carb modified with fluid restriction Diet-HS Snack? Nothing; Fluid restriction: 1200 mL Fluid; Room service appropriate? Yes; Fluid consistency: Thin  Diet effective now              EDUCATION NEEDS:   No education needs have been identified at this time  Skin:  Skin Assessment: Skin Integrity Issues: Skin Integrity Issues:: Incisions Incisions: R hip (8/25)  Last BM:  PTA/unknown  Height:   Ht Readings from Last 1 Encounters:  10/19/17 5\' 4"  (1.626 m)    Weight:   Wt Readings from Last 1 Encounters:  10/19/17 51.3 kg    Ideal Body Weight:  54.54 kg  BMI:  Body mass index is 19.41 kg/m.  Estimated Nutritional Needs:   Kcal:  1540-1795 (30-35 kcal/kg)  Protein:  70-80 grams  Fluid:  UOP +1L/day     Jarome Matin, MS, RD, LDN, CNSC Inpatient Clinical Dietitian Pager # [phone] After hours/weekend  pager # [phone]

## 2017-10-21 NOTE — Progress Notes (Signed)
Patient ID: Vanessa Webb, female   DOB: 06/16/1945, 72 y.o.   MRN: [id number]  [facility] Progress Note   Assessment/ Plan:   1.  Closed right hip fracture: Status post open reduction and internal fixation yesterday with intramedullary nail/hip screw placement.  Currently undergoing physical therapy to determine needs upon discharge.  Tentative plan for discharge noted tomorrow. 2. ESRD: Continue hemodialysis on her usual outpatient schedule of Tuesday, Thursday and Saturday with hemodialysis again tomorrow.  No acute indications for dialysis noted today. 3. Anemia: Mild drop of hemoglobin noted today, continue to monitor trend to restart ESA. 4. CKD-MBD: Hyperphosphatemia noted, begin calcium acetate for phosphorus binding. 5. Nutrition: Low albumin level noted-indeed may be an acute phase reactant following recent fracture, supplement diet with Nepro. 6. Hypertension: Blood pressures mildly elevated-possibly compounded by pain, continue amlodipine.  Subjective:   Reports some pain over surgical site   Objective:   BP (!) 157/55 (BP Location: Left Arm)   Pulse 67   Temp (!) 97.5 F (36.4 C) (Oral)   Resp 18   Ht 5\' 4"  (1.626 m)   Wt 51.3 kg   SpO2 100%   BMI 19.41 kg/m   Physical Exam: Gen: Working with physical therapy or attempting to ambulate her with a rolling walker CVS: Pulse regular rhythm, normal rate, S1 and S2 normal Resp: Clear to auscultation, no rales/rhonchi Abd: Soft, flat, nontender Ext: No lower extremity edema  Labs: BMET Recent Labs  Lab 10/19/17 1704 10/20/17 0505 10/21/17 0405  NA 138 139 137  K 5.5* 4.5 4.8  CL 99 99 100  CO2 27 29 22   GLUCOSE 132* 98 169*  BUN 28* 12 31*  CREATININE 6.06* 3.54* 5.24*  CALCIUM 8.3* 8.2* 7.7*  PHOS  --   --  7.9*   CBC Recent Labs  Lab 10/19/17 1704 10/20/17 0505 10/21/17 0405  WBC 8.0 6.2 7.8  HGB 12.1 10.8* 10.7*  HCT 38.8 34.6* 34.3*  MCV 87.2 87.4 88.4  PLT 98* 83* 82*    Medications:    . amLODipine  2.5 mg Oral Q M,W,F,Su-1800  . atorvastatin  20 mg Oral QHS  . Chlorhexidine Gluconate Cloth  6 each Topical Q0600  . docusate sodium  100 mg Oral BID  . insulin aspart  0-5 Units Subcutaneous QHS  . insulin aspart  0-9 Units Subcutaneous TID WC  . metoprolol tartrate  25 mg Oral BID  . sodium chloride flush  3 mL Intravenous Q12H  . warfarin  5 mg Oral ONCE-1800  . Warfarin - Pharmacist Dosing Inpatient   Does not apply [id number]   Elmarie Shiley, MD 10/21/2017, 11:17 AM

## 2017-10-21 NOTE — Progress Notes (Signed)
PROGRESS NOTE    Vanessa Webb  GYJ:[id number] DOB: [birth year] DOA: 10/19/2017 PCP: Monico Blitz, MD   Brief Narrative:  72 year old with past medical history relevant for chronic atrial fibrillation on warfarin, ESRD on Tuesday/Thursday/Saturday dialysis in [place] at [place], hypertension, hyperlipidemia, type 2 diabetes on insulin who presented after mechanical fall at dialysis with right hip fracture.   Assessment & Plan:   Principal Problem:   Closed right hip fracture, initial encounter [facility]) Active Problems:   End stage renal disease ([place])   Atherosclerosis of native arteries of the extremities with ulceration ([place])   DM (diabetes mellitus) with complications ([facility])   Atrial fibrillation, unspecified type ([place])   HTN (hypertension)   HLD (hyperlipidemia)   S/P right hip fracture   #) Right hip fracture status post intramedullary nailing right hip screw on 10/20/2017: In setting of mechanical fall. - Doing well postoperatively -Start home warfarin -Pain control -PT for placement  #) Right foot pain: Reported outside hospital noted possible second proximal phalanx fracture. -Foot x-ray shows no acute fracture  #) ESRD on Tuesday/Thursday/Saturday dialysis: Patient is dialyzed at [place] in [facility]. -Nephrology consult  #) Nonobstructing coronary artery disease status post NSTEMI: Patient reports remote history with cardiac catheterization that had no need for intervention. -Continue atorvastatin 20 mg nightly - Continue metoprolol tartrate 37.5 mg twice daily  #) Hypertension/hyperlipidemia: -Continue statin -Continue beta-blocker -Continue amlodipine 2.5 mg on Monday, Wednesday, Friday, Sunday  #) Type 2 diabetes: Only on sliding scale glargine at home -Sensitive sliding scale, AC at bedtime  #) Chronic atrial fibrillation: -Restarting home warfarin  Fluids: Tolerating p.o. Electrolytes s: Monitor and supplement  nutrition:  Renal/carb restricted diet  Prophylaxis: Home warfarin  Disposition: Pending surgery and PT consult  Full code   Consultants:   Orthopedic surgery  Nephrology  Procedures:   10/20/2017 pending surgery for hip  Antimicrobials:   Perioperative antibiotics   Subjective: Patient reports that she is doing better after the surgery.  She does report some pain however reports it is well controlled.  She denies any nausea, vomiting, diarrhea, cough, congestion, rhinorrhea.  Objective: Vitals:   10/20/17 2034 10/21/17 0047 10/21/17 0531 10/21/17 0757  BP: (!) 141/49 (!) 136/50 (!) 155/51 (!) 157/55  Pulse: 72 72 67 67  Resp: 15  16 18   Temp: 98.4 F (36.9 C)   (!) 97.5 F (36.4 C)  TempSrc:    Oral  SpO2: 100%  100% 100%  Weight:      Height:        Intake/Output Summary (Last 24 hours) at 10/21/2017 1013 Last data filed at 10/21/2017 0936 Gross per 24 hour  Intake 850 ml  Output 50 ml  Net 800 ml   Filed Weights   10/19/17 1541 10/19/17 1934 10/19/17 2324  Weight: 51 kg 51.3 kg 51.3 kg    Examination:  General exam: Appears calm and comfortable  Respiratory system: Clear to auscultation. Respiratory effort normal. Cardiovascular system: Irregularly irregular, 2 out of 6 referred systolic murmur Gastrointestinal system: Abdomen is nondistended, soft and nontender. No organomegaly or masses felt. Normal bowel sounds heard. Central nervous system: Alert and oriented. No focal neurological deficits. Extremities: No lower extremity edema, right upper extremity fistula with palpable thrill and audible bruit.  Intact distal pulses of her right lower extremity,  Skin: Incision site is clean dry and intact Psychiatry: Judgement and insight appear normal. Mood & affect appropriate.     Data Reviewed: I have personally  reviewed following labs and imaging studies  CBC: Recent Labs  Lab 10/19/17 1704 10/20/17 0505 10/21/17 0405  WBC 8.0 6.2 7.8  HGB 12.1 10.8*  10.7*  HCT 38.8 34.6* 34.3*  MCV 87.2 87.4 88.4  PLT 98* 83* 82*   Basic Metabolic Panel: Recent Labs  Lab 10/19/17 1704 10/20/17 0505 10/21/17 0405  NA 138 139 137  K 5.5* 4.5 4.8  CL 99 99 100  CO2 27 29 22   GLUCOSE 132* 98 169*  BUN 28* 12 31*  CREATININE 6.06* 3.54* 5.24*  CALCIUM 8.3* 8.2* 7.7*  MG 1.9  --   --   PHOS  --   --  7.9*   GFR: Estimated Creatinine Clearance: 7.7 mL/min (A) (by C-G formula based on SCr of 5.24 mg/dL (H)). Liver Function Tests: Recent Labs  Lab 10/19/17 1704 10/21/17 0405  AST 127*  --   ALT 75*  --   ALKPHOS 91  --   BILITOT 1.2  --   PROT 7.1  --   ALBUMIN 3.2* 2.7*   No results for input(s): LIPASE, AMYLASE in the last 168 hours. No results for input(s): AMMONIA in the last 168 hours. Coagulation Profile: Recent Labs  Lab 10/19/17 1704 10/20/17 0505 10/21/17 0405  INR 1.39 1.50 1.55   Cardiac Enzymes: No results for input(s): CKTOTAL, CKMB, CKMBINDEX, TROPONINI in the last 168 hours. BNP (last 3 results) No results for input(s): PROBNP in the last 8760 hours. HbA1C: No results for input(s): HGBA1C in the last 72 hours. CBG: Recent Labs  Lab 10/20/17 1406 10/20/17 1702 10/20/17 1759 10/20/17 2035 10/21/17 0753  GLUCAP 89 72 83 119* 144*   Lipid Profile: No results for input(s): CHOL, HDL, LDLCALC, TRIG, CHOLHDL, LDLDIRECT in the last 72 hours. Thyroid Function Tests: No results for input(s): TSH, T4TOTAL, FREET4, T3FREE, THYROIDAB in the last 72 hours. Anemia Panel: No results for input(s): VITAMINB12, FOLATE, FERRITIN, TIBC, IRON, RETICCTPCT in the last 72 hours. Sepsis Labs: No results for input(s): PROCALCITON, LATICACIDVEN in the last 168 hours.  Recent Results (from the past 240 hour(s))  Surgical pcr screen     Status: Abnormal   Collection Time: 10/19/17  4:01 PM  Result Value Ref Range Status   MRSA, PCR NEGATIVE NEGATIVE Final   Staphylococcus aureus POSITIVE (A) NEGATIVE Final    Comment:  (NOTE) The Xpert SA Assay (FDA approved for NASAL specimens in patients 59 years of age and older), is one component of a comprehensive surveillance program. It is not intended to diagnose infection nor to guide or monitor treatment. Performed at [facility], [place] [address], [place], [place] [address]          Radiology Studies: Dg Foot Complete Right  Result Date: 10/19/2017 CLINICAL DATA:  Fall, medial right foot pain EXAM: RIGHT FOOT COMPLETE - 3+ VIEW COMPARISON:  None. FINDINGS: No fracture or dislocation is seen. The joint spaces are preserved. Visualized soft tissues are within normal limits. Vascular calcifications. IMPRESSION: Negative. Electronically Signed   By: Julian Hy M.D.   On: 10/19/2017 16:55   Dg C-arm 1-60 Min  Result Date: 10/20/2017 CLINICAL DATA:  Right hip ORIF EXAM: DG C-ARM 61-120 MIN; RIGHT FEMUR 2 VIEWS COMPARISON:  None. FINDINGS: Screws been placed across the right femoral neck. An intramedullary screw extends through the femur, extending distally. No distal interlocking screw noted. IMPRESSION: Hardware placement as above. Electronically Signed   By: Dorise Bullion III M.D   On: 10/20/2017 16:59  Dg Hip Unilat With Pelvis 2-3 Views Right  Result Date: 10/19/2017 CLINICAL DATA:  Fall, right hip pain EXAM: DG HIP (WITH OR WITHOUT PELVIS) 2-3V RIGHT COMPARISON:  [facility] right hip radiographs dated 10/19/2017 at 0725 hours FINDINGS: Intertrochanteric right hip fracture. Mild with foreshortening with varus angulation. Bilateral hip joint spaces are symmetric. Visualized bony pelvis appears intact. IMPRESSION: Intertrochanteric right hip fracture, as above. Electronically Signed   By: Julian Hy M.D.   On: 10/19/2017 16:54   Dg Femur, Min 2 Views Right  Result Date: 10/20/2017 CLINICAL DATA:  Right hip ORIF EXAM: DG C-ARM 61-120 MIN; RIGHT FEMUR 2 VIEWS COMPARISON:  None. FINDINGS: Screws been placed across the right femoral neck.  An intramedullary screw extends through the femur, extending distally. No distal interlocking screw noted. IMPRESSION: Hardware placement as above. Electronically Signed   By: Dorise Bullion III M.D   On: 10/20/2017 16:59        Scheduled Meds: . amLODipine  2.5 mg Oral Q M,W,F,Su-1800  . atorvastatin  20 mg Oral QHS  . Chlorhexidine Gluconate Cloth  6 each Topical Q0600  . docusate sodium  100 mg Oral BID  . insulin aspart  0-5 Units Subcutaneous QHS  . insulin aspart  0-9 Units Subcutaneous TID WC  . metoprolol tartrate  25 mg Oral BID  . sodium chloride flush  3 mL Intravenous Q12H  . warfarin  5 mg Oral ONCE-1800  . Warfarin - Pharmacist Dosing Inpatient   Does not apply q1800   Continuous Infusions: . sodium chloride    . sodium chloride       LOS: 2 days    Time spent: [place], MD [facility]  If 7PM-7AM, please contact night-coverage [email or web address] Password [email or web address] 10/21/2017, 10:13 AM

## 2017-10-21 NOTE — Evaluation (Addendum)
Physical Therapy Evaluation Patient Details Name: Vanessa Webb MRN: [id number] DOB: [birth year] Today's Date: 10/21/2017   History of Present Illness  Vanessa Webb is a 72 y.o. female with medical history significant of type 2 diabetes on sliding scale insulin, hypertension, hyperlipidemia, coronary artery disease status post MI with no intervention, paroxysmal atrial fibrillation on warfarin, ESRD on Tuesday/Thursday/Saturday dialysis at [facility] in [facility] who came to the hospital after a mechanical fall at dialysis per her report, with resulting right hip fracture, possible R proximal second phalanx (managing non-operatively), now s/p IM Nailing 10/20/17. She is WBAT RLE.  Clinical Impression   Patient is s/p above surgery resulting in functional limitations due to the deficits listed below (see PT Problem List). Independent Prior to admission; Presents with Pain in R hip and foot, decr functional mobility, decr activity tolerance; Some dizziness with incr time in standing/walking (unable to obtain standing BP); Husband is very supportive, and can give 24 hour assist, and they both verbalized a goal to get home; Noted OT recommending SNF for rehab -- I agree with OT that we must be cautious and assess her activity tolerance post HD before sending her home;  Patient will benefit from skilled PT to increase their independence and safety with mobility to allow discharge to the venue listed below.       Follow Up Recommendations Addendum 8/27: Vanessa Webb will need to be mobile enough to manage going to Outpt HD 3x/week to be able to go home; Recommend pursuing post-acute rehab options, especially given conversation with RN about her status post HD today (8/27); Pt and husband voiced that they would like to get home, and if they decline post-acute rehab, then Home health PT;Supervision/Assistance - 24 hour(We do need to assess her activity tolerance post HD tomorrow(8/27); I still lean towards dc  home with very helpful spouse -- just want to be cautious with dc home given 3x/week trip to HD; Still, 24 hours usually makes a big difference - often for the better- with my orthopedic patients)    Equipment Recommendations  Rolling walker with 5" wheels;3in1 (PT)    Recommendations for Other Services OT consult(as ordered)     Precautions / Restrictions Precautions Precautions: Fall Restrictions Weight Bearing Restrictions: Yes RLE Weight Bearing: Weight bearing as tolerated      Mobility  Bed Mobility Overal bed mobility: Needs Assistance Bed Mobility: Supine to Sit     Supine to sit: Min assist     General bed mobility comments: Min A RLE to advance to EOB  Transfers Overall transfer level: Needs assistance Equipment used: Rolling walker (2 wheeled) Transfers: Sit to/from Stand Sit to Stand: Min assist Stand pivot transfers: Min assist       General transfer comment: Min Vc's for hand placement, safety and sequencing required w/ RW.  Ambulation/Gait Ambulation/Gait assistance: Min assist;+2 safety/equipment Gait Distance (Feet): 20 Feet Assistive device: Rolling walker (2 wheeled) Gait Pattern/deviations: Decreased stance time - right;Decreased step length - left     General Gait Details: Cues for sequence, and to bear down into RW to unweigh painful RLE in stance; Fatigued with incr time in upright activity  Stairs            Wheelchair Mobility    Modified Rankin (Stroke Patients Only)       Balance Overall balance assessment: Needs assistance Sitting-balance support: Bilateral upper extremity supported Sitting balance-Leahy Scale: Good     Standing balance support: Bilateral upper extremity supported  Standing balance-Leahy Scale: Fair                               Pertinent Vitals/Pain Pain Assessment: 0-10 Pain Score: 3  Pain Location: Foot Pain Descriptors / Indicators: Aching;Constant Pain Intervention(s): Monitored  during session(educated pt to use RW to unweigh painful RLE in stance)    Home Living Family/patient expects to be discharged to:: Private residence Living Arrangements: Spouse/significant other Available Help at Discharge: Family Type of Home: House Home Access: Stairs to enter Entrance Stairs-Rails: None Entrance Stairs-Number of Steps: 2 STE Home Layout: One level;Other (Comment)(+1 step into bedroom) Home Equipment: Tub bench;Grab bars - tub/shower      Prior Function Level of Independence: Independent         Comments: Husband drives her to and from HD     Hand Dominance   Dominant Hand: Right    Extremity/Trunk Assessment   Upper Extremity Assessment Upper Extremity Assessment: Defer to OT evaluation    Lower Extremity Assessment Lower Extremity Assessment: RLE deficits/detail RLE Deficits / Details: Grossly decr AROM and strength, limited by pain in hip; painful across dorsum of foot with weight bearing as well       Communication   Communication: No difficulties  Cognition Arousal/Alertness: Awake/alert Behavior During Therapy: WFL for tasks assessed/performed;Flat affect Overall Cognitive Status: Within Functional Limits for tasks assessed                                        General Comments      Exercises     Assessment/Plan    PT Assessment Patient needs continued PT services  PT Problem List Decreased strength;Decreased range of motion;Decreased activity tolerance;Decreased balance;Decreased mobility;Decreased knowledge of use of DME;Decreased knowledge of precautions;Pain       PT Treatment Interventions DME instruction;Gait training;Stair training;Functional mobility training;Therapeutic activities;Therapeutic exercise;Balance training;Patient/family education    PT Goals (Current goals can be found in the Care Plan section)  Acute Rehab PT Goals Patient Stated Goal: "Go home" and do things for herself PT Goal  Formulation: With patient Time For Goal Achievement: 11/04/17 Potential to Achieve Goals: Good    Frequency Min 6X/week   Barriers to discharge Other (comment) Would like to see how she does walking post HD    Co-evaluation               AM-PAC PT "6 Clicks" Daily Activity  Outcome Measure Difficulty turning over in bed (including adjusting bedclothes, sheets and blankets)?: A Lot Difficulty moving from lying on back to sitting on the side of the bed? : Unable Difficulty sitting down on and standing up from a chair with arms (e.g., wheelchair, bedside commode, etc,.)?: A Lot Help needed moving to and from a bed to chair (including a wheelchair)?: A Lot Help needed walking in hospital room?: A Little Help needed climbing 3-5 steps with a railing? : A Lot 6 Click Score: 12    End of Session Equipment Utilized During Treatment: Gait belt Activity Tolerance: Patient limited by fatigue Patient left: in chair;with call bell/phone within reach;with family/visitor present Nurse Communication: Mobility status PT Visit Diagnosis: Other abnormalities of gait and mobility (R26.89);Pain Pain - Right/Left: Right Pain - part of body: Hip(foot)    Time: 1110-1130 PT Time Calculation (min) (ACUTE ONLY): 20 min   Charges:   PT  Evaluation $PT Eval Moderate Complexity: [place], [place]  Acute Rehabilitation Services Pager [phone] [facility] [place] 10/21/2017, 1:10 PM

## 2017-10-21 NOTE — Evaluation (Signed)
Occupational Therapy Evaluation Patient Details Name: Vanessa Webb MRN: [id number] DOB: [birth year] Today's Date: 10/21/2017    History of Present Illness Vanessa Webb is a 72 y.o. female with medical history significant of type 2 diabetes on sliding scale insulin, hypertension, hyperlipidemia, coronary artery disease status post MI with no intervention, paroxysmal atrial fibrillation on warfarin, ESRD on Tuesday/Thursday/Saturday dialysis at [place] in [facility] who came to the hospital after a mechanical fall at dialysis per her report, with resulting right hip fracture, now s/p IM Nailing 10/20/17. She is WBAT RLE.   Clinical Impression   Pt was admitted as above currently Min A overall for functional mobility/transfers and ADL's. She reports that was independent with ADL's and did not use DME prior to this admission. She should benefit from acute OT assist in maximizing independence with ADL's and self care tasks as well as functional mobility/transfers related to ADL's at anticipated Mod I level. She may benefit from short term SNF Rehab prior to returning home.    Follow Up Recommendations  SNF;Supervision/Assistance - 24 hour    Equipment Recommendations  3 in 1 bedside commode;Other (comment)(Consider LH A/E for ADL's)    Recommendations for Other Services       Precautions / Restrictions Precautions Precautions: Fall Restrictions Weight Bearing Restrictions: Yes RLE Weight Bearing: Weight bearing as tolerated      Mobility Bed Mobility Overal bed mobility: Needs Assistance Bed Mobility: Supine to Sit     Supine to sit: Min assist     General bed mobility comments: Min A RLE to advance to EOB  Transfers Overall transfer level: Needs assistance Equipment used: Rolling walker (2 wheeled) Transfers: Sit to/from [facility] Sit to Stand: Min assist Stand pivot transfers: Min assist       General transfer comment: Min Vc's for hand  placement, safety and sequencing required w/ RW.    Balance Overall balance assessment: Needs assistance Sitting-balance support: Bilateral upper extremity supported Sitting balance-Leahy Scale: Good     Standing balance support: Bilateral upper extremity supported Standing balance-Leahy Scale: Fair                             ADL either performed or assessed with clinical judgement   ADL Overall ADL's : Needs assistance/impaired Eating/Feeding: Set up;Bed level   Grooming: Wash/dry hands;Wash/dry face;Modified independent;Sitting   Upper Body Bathing: Set up;Sitting   Lower Body Bathing: Sitting/lateral leans;Sit to/from stand;Moderate assistance(Simulated)   Upper Body Dressing : Modified independent;Sitting   Lower Body Dressing: Moderate assistance;Sit to/from stand;Sitting/lateral leans(Simulated) Lower Body Dressing Details (indicate cue type and reason): May benefit from A/E PRN Toilet Transfer: Minimal assistance;Cueing for safety;Cueing for sequencing;Ambulation;BSC;RW(Simulated - pt transferred from EOB & took several steps to recliner chair)   Toileting- Clothing Manipulation and Hygiene: Minimal assistance;Sitting/lateral lean;Sit to/from stand;Cueing for safety     Tub/Shower Transfer Details (indicate cue type and reason): Pt has tub at home and tub bench ("I don't use it though") Functional mobility during ADLs: Minimal assistance;Cueing for safety;Cueing for sequencing;Rolling walker General ADL Comments: Pt currently demonstrating deficits in her ability to perform LB ADL's and functional transfers. She should benefit from acute OT to assist in maximizing indepdendence w/ ADL's as well as provide pt/family education and a/e, DME instruction PRN. Pt was I PTA     Vision Baseline Vision/History: Wears glasses Wears Glasses: At all times Patient Visual Report: No change from baseline Vision Assessment?:  No apparent visual deficits     Perception      Praxis      Pertinent Vitals/Pain Pain Assessment: 0-10 Pain Score: 3  Pain Location: Foot Pain Descriptors / Indicators: Aching;Constant Pain Intervention(s): Limited activity within patient's tolerance;Repositioned;Monitored during session     Hand Dominance Right   Extremity/Trunk Assessment Upper Extremity Assessment Upper Extremity Assessment: Overall WFL for tasks assessed;Generalized weakness   Lower Extremity Assessment Lower Extremity Assessment: Defer to PT evaluation       Communication Communication Communication: No difficulties   Cognition Arousal/Alertness: Awake/alert Behavior During Therapy: WFL for tasks assessed/performed Overall Cognitive Status: Within Functional Limits for tasks assessed                                     General Comments       Exercises     Shoulder Instructions      Home Living Family/patient expects to be discharged to:: Private residence Living Arrangements: Spouse/significant other Available Help at Discharge: Family Type of Home: House Home Access: Stairs to enter [employer] of Steps: 2 STE Entrance Stairs-Rails: None Home Layout: One level;Other (Comment)(+1 step into bedroom)     Bathroom Shower/Tub: Tub/shower unit;Curtain   Biochemist, clinical: Standard Bathroom Accessibility: Yes   Home Equipment: Tub bench;Grab bars - tub/shower          Prior Functioning/Environment Level of Independence: Independent                 OT Problem List: Decreased strength;Decreased activity tolerance;Decreased knowledge of use of DME or AE;Pain      OT Treatment/Interventions: Self-care/ADL training;DME and/or AE instruction;Therapeutic activities;Patient/family education    OT Goals(Current goals can be found in the care plan section) Acute Rehab OT Goals Patient Stated Goal: "Go home" and do things for herself OT Goal Formulation: With patient Time For Goal Achievement:  11/04/17 Potential to Achieve Goals: Good  OT Frequency: Min 3X/week   Barriers to D/C:            Co-evaluation              AM-PAC PT "6 Clicks" Daily Activity     Outcome Measure Help from another person eating meals?: None Help from another person taking care of personal grooming?: A Little Help from another person toileting, which includes using toliet, bedpan, or urinal?: A Little Help from another person bathing (including washing, rinsing, drying)?: A Little Help from another person to put on and taking off regular upper body clothing?: None Help from another person to put on and taking off regular lower body clothing?: A Little 6 Click Score: 20   End of Session Equipment Utilized During Treatment: Gait belt;Rolling walker Nurse Communication: Mobility status  Activity Tolerance: Patient tolerated treatment well Patient left: in chair;with call bell/phone within reach;with family/visitor present  OT Visit Diagnosis: Pain;Muscle weakness (generalized) (M62.81);Unsteadiness on feet (R26.81) Pain - Right/Left: Right Pain - part of body: Hip(Hip/Foot)                Time: 8756-4332 OT Time Calculation (min): 29 min Charges:  OT General Charges $OT Visit: 1 Visit OT Evaluation $OT Eval Low Complexity: 1 Low OT Treatments $Self Care/Home Management : 8-22 mins   Lynore Coscia Beth Dixon, OTR/L 10/21/2017, 11:31 AM

## 2017-10-21 NOTE — Progress Notes (Signed)
ANTICOAGULATION CONSULT NOTE - Initial Consult  Pharmacy Consult for Warfarin Indication: atrial fibrillation  Allergies  Allergen Reactions  . Sulfa Antibiotics Nausea Only    nausea   Patient Measurements: Height: 5\' 4"  (162.6 cm) Weight: 113 lb 1.5 oz (51.3 kg) IBW/kg (Calculated) : 54.7 Heparin Dosing Weight: 51 kg  Vital Signs: Temp: 97.5 F (36.4 C) (08/26 0757) Temp Source: Oral (08/26 0757) BP: 157/55 (08/26 0757) Pulse Rate: 67 (08/26 0757)  Labs: Recent Labs    10/19/17 1704 10/20/17 0505 10/21/17 0405  HGB 12.1 10.8* 10.7*  HCT 38.8 34.6* 34.3*  PLT 98* 83* 82*  APTT 33  --   --   LABPROT 16.9* 18.0* 18.4*  INR 1.39 1.50 1.55  CREATININE 6.06* 3.54* 5.24*   Estimated Creatinine Clearance: 7.7 mL/min (A) (by C-G formula based on SCr of 5.24 mg/dL (H)).  Assessment: 3 yoF presenting s/p mechanical fall with R hip fracture, PMH afib on warfarin PTA. Pharmacy consulted to resume warfarin dosing. INR below goal at 1.55 this AM. Hgb 12.1>>10.7, pltc 82. No bleeding noted.  PTA warfarin dose: 2.5mg  TTSa, 5mg  MWF (last dose 8/23 PTA, admit INR 1.5)  Goal of Therapy:  INR 2-3 Monitor platelets by anticoagulation protocol: Yes   Plan:  Warfarin 5mg  PO x1 Daily INR  Monitor s/sx of bleeding  Tayloranne Lekas A. Levada Dy, PharmD, [place] Pager: [phone] Please utilize Amion for appropriate phone number to reach the unit pharmacist ([place])   10/21/2017,8:04 AM

## 2017-10-21 NOTE — Progress Notes (Signed)
Subjective: 1 Day Post-Op Procedure(s) (LRB): INTRAMEDULLARY (IM) NAIL INTERTROCHANTRIC FEMORAL NAIL (Right) Patient reports pain as moderate.  Tolerated her surgery well yesterday afternoon.  Only slight acute blood loss anemia from surgery.  Family at the bedside.  Objective: Vital signs in last 24 hours: Temp:  [97.3 F (36.3 C)-98.8 F (37.1 C)] 98.4 F (36.9 C) (08/25 2034) Pulse Rate:  [67-77] 67 (08/26 0531) Resp:  [15-19] 16 (08/26 0531) BP: (123-165)/(47-57) 155/51 (08/26 0531) SpO2:  [94 %-100 %] 100 % (08/26 0531)  Intake/Output from previous day: 08/25 0701 - 08/26 0700 In: 610 [P.O.:110; I.V.:500] Out: 50 [Blood:50] Intake/Output this shift: No intake/output data recorded.  Recent Labs    10/19/17 1704 10/20/17 0505 10/21/17 0405  HGB 12.1 10.8* 10.7*   Recent Labs    10/20/17 0505 10/21/17 0405  WBC 6.2 7.8  RBC 3.96 3.88  HCT 34.6* 34.3*  PLT 83* 82*   Recent Labs    10/20/17 0505 10/21/17 0405  NA 139 137  K 4.5 4.8  CL 99 100  CO2 29 22  BUN 12 31*  CREATININE 3.54* 5.24*  GLUCOSE 98 169*  CALCIUM 8.2* 7.7*   Recent Labs    10/20/17 0505 10/21/17 0405  INR 1.50 1.55    Intact pulses distally Dorsiflexion/Plantar flexion intact Incision: scant drainage   Assessment/Plan: 1 Day Post-Op Procedure(s) (LRB): INTRAMEDULLARY (IM) NAIL INTERTROCHANTRIC FEMORAL NAIL (Right) Up with therapy  WBAT right hip    Mcarthur Rossetti 10/21/2017, 7:41 AM

## 2017-10-22 LAB — BASIC METABOLIC PANEL
ANION GAP: 13 (ref 5–15)
BUN: 52 mg/dL — ABNORMAL HIGH (ref 8–23)
CHLORIDE: 96 mmol/L — AB (ref 98–111)
CO2: 26 mmol/L (ref 22–32)
CREATININE: 6.26 mg/dL — AB (ref 0.44–1.00)
Calcium: 7.9 mg/dL — ABNORMAL LOW (ref 8.9–10.3)
GFR calc Af Amer: 7 mL/min — ABNORMAL LOW (ref >60)
GFR calc non Af Amer: 6 mL/min — ABNORMAL LOW (ref >60)
GLUCOSE: 122 mg/dL — AB (ref 70–99)
Potassium: 4.5 mmol/L (ref 3.5–5.1)
Sodium: 135 mmol/L (ref 135–145)

## 2017-10-22 LAB — CBC
HEMATOCRIT: 30.1 % — AB (ref 36.0–46.0)
HEMOGLOBIN: 9.6 g/dL — AB (ref 12.0–15.0)
MCH: 27.5 pg (ref 26.0–34.0)
MCHC: 31.9 g/dL (ref 30.0–36.0)
MCV: 86.2 fL (ref 78.0–100.0)
Platelets: 103 10*3/uL — ABNORMAL LOW (ref 150–400)
RBC: 3.49 MIL/uL — ABNORMAL LOW (ref 3.87–5.11)
RDW: 14.6 % (ref 11.5–15.5)
WBC: 6.6 10*3/uL (ref 4.0–10.5)

## 2017-10-22 LAB — GLUCOSE, CAPILLARY
Glucose-Capillary: 76 mg/dL (ref 70–99)
Glucose-Capillary: 80 mg/dL (ref 70–99)
Glucose-Capillary: 118 mg/dL — ABNORMAL HIGH (ref 70–99)

## 2017-10-22 LAB — PROTIME-INR
INR: 2.24
Prothrombin Time: 24.6 seconds — ABNORMAL HIGH (ref 11.4–15.2)

## 2017-10-22 MED ORDER — HYDROMORPHONE HCL 1 MG/ML IJ SOLN
INTRAMUSCULAR | Status: AC
  Filled 2017-10-22: qty 0.5

## 2017-10-22 MED ORDER — DARBEPOETIN ALFA 40 MCG/0.4ML IJ SOSY
PREFILLED_SYRINGE | INTRAMUSCULAR | Status: AC
  Filled 2017-10-22: qty 0.4

## 2017-10-22 MED ORDER — DARBEPOETIN ALFA 40 MCG/0.4ML IJ SOSY
40.0000 ug | PREFILLED_SYRINGE | INTRAMUSCULAR | Status: DC
  Administered 2017-10-22: 40 ug via INTRAVENOUS

## 2017-10-22 MED ORDER — WARFARIN SODIUM 1 MG PO TABS
1.0000 mg | ORAL_TABLET | Freq: Once | ORAL | Status: AC
  Administered 2017-10-22: 1 mg via ORAL
  Filled 2017-10-22: qty 1

## 2017-10-22 NOTE — Progress Notes (Signed)
Physical Therapy Note  Discussed pt with Kami, RN, who has concerns with Vanessa Webb' functional status, and her ability to manage at home safely with husband assist;  At this point, it is worth considering post-acute rehab for therapy to maximize independence and safety with mobility prior to dc home;   Recommend pursuing rehab options ( I will place a CIR Admissions Coordinator screen request), and PT/OT will continue therapy efforts and monitor progress;   Thank you,  Roney Marion, [place] Pager [phone] Office [phone]

## 2017-10-22 NOTE — Progress Notes (Signed)
OT Treatment Note  Pt making slow progress.Feel pt would benefit from rehab at SNF to facilitate safe return home.     10/22/17 1600  OT Visit Information  Last OT Received On 10/22/17  Assistance Needed +2 (for chair follow)  History of Present Illness Vanessa Webb is a 72 y.o. female with medical history significant of type 2 diabetes on sliding scale insulin, hypertension, hyperlipidemia, coronary artery disease status post MI with no intervention, paroxysmal atrial fibrillation on warfarin, ESRD on Tuesday/Thursday/Saturday dialysis at [facility] in [facility] who came to the hospital after a mechanical fall at dialysis per her report, with resulting right hip fracture, possible R proximal second phalanx (managing non-operatively), now s/p IM Nailing 10/20/17. She is WBAT RLE.  Precautions  Precautions Fall  Pain Assessment  Pain Assessment Faces  Faces Pain Scale 6  Pain Location right leg  Pain Descriptors / Indicators Aching;Discomfort;Grimacing  Pain Intervention(s) Limited activity within patient's tolerance  Cognition  Arousal/Alertness Awake/alert  Behavior During Therapy Flat affect  Overall Cognitive Status Within Functional Limits for tasks assessed  Upper Extremity Assessment  Upper Extremity Assessment Generalized weakness  Lower Extremity Assessment  Lower Extremity Assessment Defer to PT evaluation  ADL  Overall ADL's  Needs assistance/impaired  Lower Body Dressing Moderate assistance;Sit to/from stand;Sitting/lateral leans  Toilet Transfer Minimal assistance;Cueing for safety;Cueing for sequencing;Ambulation;BSC;RW  Functional mobility during ADLs Minimal assistance;Rolling walker;Cueing for safety  General ADL Comments Only able to take @ 3 steps forward and back to chair prior to fatigue and expressing fear of falling; Discussed possible use of AE for LB ADL  Bed Mobility  General bed mobility comments Pt was OOB in the recliner chair.   Balance  Sitting  balance-Leahy Scale Fair  Standing balance-Leahy Scale Poor  Restrictions  RLE Weight Bearing WBAT  Transfers  Overall transfer level Needs assistance  Equipment used Rolling walker (2 wheeled)  Transfers Sit to/from Stand  Sit to Stand Min assist  General transfer comment A to control descent  OT - End of Session  Equipment Utilized During Treatment Gait belt;Rolling walker  Activity Tolerance Patient tolerated treatment well  Patient left in chair;with call bell/phone within reach  Nurse Communication Mobility status  OT Assessment/Plan  OT Plan Discharge plan remains appropriate;Frequency needs to be updated  OT Visit Diagnosis Pain;Muscle weakness (generalized) (M62.81);Unsteadiness on feet (R26.81)  Pain - Right/Left Right  Pain - part of body Hip  OT Frequency (ACUTE ONLY) Min 2X/week  Follow Up Recommendations SNF;Supervision/Assistance - 24 hour  OT Equipment 3 in 1 bedside commode;Other (comment)  AM-PAC OT "6 Clicks" Daily Activity Outcome Measure  Help from another person eating meals? 4  Help from another person taking care of personal grooming? 3  Help from another person toileting, which includes using toliet, bedpan, or urinal? 3  Help from another person bathing (including washing, rinsing, drying)? 3  Help from another person to put on and taking off regular upper body clothing? 4  Help from another person to put on and taking off regular lower body clothing? 2  6 Click Score 19  ADL G Code Conversion CK  OT Goal Progression  Progress towards OT goals Progressing toward goals  Acute Rehab OT Goals  Patient Stated Goal to get better  OT Goal Formulation With patient  Time For Goal Achievement 11/04/17  Potential to Achieve Goals Good  ADL Goals  Pt Will Perform Grooming with modified independence;standing  Pt Will Perform Lower Body Dressing with modified  independence;with adaptive equipment;sit to/from stand  Pt Will Transfer to Toilet with modified  independence;ambulating;bedside commode  Pt Will Perform Toileting - Clothing Manipulation and hygiene sitting/lateral leans;with modified independence;sit to/from stand  Pt Will Perform Tub/Shower Transfer Tub transfer;with supervision;tub bench;rolling walker;ambulating  OT Time Calculation  OT Start Time (ACUTE ONLY) 1532  OT Stop Time (ACUTE ONLY) 1547  OT Time Calculation (min) 15 min  OT General Charges  $OT Visit 1 Visit  OT Treatments  $Self Care/Home Management  8-22 mins  Maurie Boettcher, OT/L  OT Clinical Specialist [phone]

## 2017-10-22 NOTE — Progress Notes (Signed)
Physical Therapy Treatment Patient Details Name: Vanessa Webb MRN: [id number] DOB: September 18, 1945 Today's Date: 10/22/2017    History of Present Illness Vanessa Webb is a 72 y.o. female with medical history significant of type 2 diabetes on sliding scale insulin, hypertension, hyperlipidemia, coronary artery disease status post MI with no intervention, paroxysmal atrial fibrillation on warfarin, ESRD on Tuesday/Thursday/Saturday dialysis at [facility] in [facility] who came to the hospital after a mechanical fall at dialysis per her report, with resulting right hip fracture, possible R proximal second phalanx (managing non-operatively), now s/p IM Nailing 10/20/17. She is WBAT RLE.    PT Comments    Pt needed significantly more help today post HD than in previous session.  She was mod assist overall and only able to walk ~3' with RW before fatigue and lightheadedness stopped her forward progression.  We continued LE exercises in the chair afterwards.  I spoke with pt and her husband and they have decided, despite pt's desire to return home after discharge, they would be better to do some post acute therapy and their preference is closer to home in [place], [place].  Per husband the SW is already aware.  PT will continue to follow acutely to progress safe mobility.  We may have better luck seeing her on her non-HD days.    Follow Up Recommendations  SNF;Other (comment)(family wants her closer to [place], [place] where they live)     Equipment Recommendations  Rolling walker with 5" wheels;3in1 (PT);Wheelchair cushion (measurements PT);Wheelchair (measurements PT)    Recommendations for Other Services   NA     Precautions / Restrictions Precautions Precautions: Fall Restrictions RLE Weight Bearing: Weight bearing as tolerated    Mobility  Bed Mobility               General bed mobility comments: Pt was OOB in the recliner chair.   Transfers Overall transfer level: Needs assistance Equipment  used: Rolling walker (2 wheeled) Transfers: Sit to/from Stand Sit to Stand: Mod assist         General transfer comment: Heavy mod assist to stand from low recliner chair.  Verbal cues and manual assist for safe hand placement.   Ambulation/Gait Ambulation/Gait assistance: Mod assist Gait Distance (Feet): 3 Feet Assistive device: Rolling walker (2 wheeled) Gait Pattern/deviations: Step-to pattern;Antalgic;Trunk flexed(R leg internally rotated and adducted during gait.) Gait velocity: decreased Gait velocity interpretation: <1.31 ft/sec, indicative of household ambulator General Gait Details: Pt with moderately antalgic gait pattern, mod assist at trunk as she attempted 3-5 steps forward with RW, mod assist to support trunk.  Pt reported fatigue and a bit of lightheadedness and therapist pulled chair up behind her so she could sit.           Balance Overall balance assessment: Needs assistance Sitting-balance support: Feet supported;Bilateral upper extremity supported Sitting balance-Leahy Scale: Poor Sitting balance - Comments: min assist to keep trunk up off of back of chair and pt using hands to support herself in this position as well Postural control: Posterior lean Standing balance support: Bilateral upper extremity supported Standing balance-Leahy Scale: Poor Standing balance comment: needs mod assist in standing, several times in static standing pt reported "i'm falling back (because she was)" and therapist had to support her trunk to maintaing even static standing balance.                              Cognition Arousal/Alertness: Vanessa Webb  During Therapy: Flat affect Overall Cognitive Status: Within Functional Limits for tasks assessed                                 General Comments: Pt a bit lethargic post HD and pain meds      Exercises Total Joint Exercises Ankle Circles/Pumps: AROM;Both;20 reps Quad Sets: AROM;Right;5  reps Heel Slides: AAROM;Right;10 reps Hip ABduction/ADduction: AAROM;Right;10 reps Long Arc Quad: AROM;Right;10 reps    General Comments General comments (skin integrity, edema, etc.): Pt with significant difference from previous, non- HD day session.  I discussed with pt and her husband that if she is like this on her HD days that going home would not be safe.  Husband agreed and pt is aware that despite her best hopes to return home initially, post acute rehab is safer until she is stronger.  They have already talked to the SW and want to pursue SNF level rehab near their home in [place], [place].       Pertinent Vitals/Pain Pain Assessment: 0-10 Pain Score: 7  Pain Location: right leg Pain Descriptors / Indicators: Aching;Constant Pain Intervention(s): Limited activity within patient's tolerance;Monitored during session;Repositioned;Premedicated before session           PT Goals (current goals can now be found in the care plan section) Acute Rehab PT Goals Patient Stated Goal: "Go home" and do things for herself Progress towards PT goals: Not progressing toward goals - comment(pt significantly weaker on her HD day today)    Frequency    Min 3X/week      PT Plan Discharge plan needs to be updated       AM-PAC PT "6 Clicks" Daily Activity  Outcome Measure  Difficulty turning over in bed (including adjusting bedclothes, sheets and blankets)?: Unable Difficulty moving from lying on back to sitting on the side of the bed? : Unable Difficulty sitting down on and standing up from a chair with arms (e.g., wheelchair, bedside commode, etc,.)?: Unable Help needed moving to and from a bed to chair (including a wheelchair)?: A Lot Help needed walking in hospital room?: A Lot Help needed climbing 3-5 steps with a railing? : Total 6 Click Score: 8    End of Session Equipment Utilized During Treatment: Gait belt Activity Tolerance: Patient limited by lethargy Patient left: in chair;with  call bell/phone within reach;with family/visitor present Nurse Communication: Mobility status PT Visit Diagnosis: Other abnormalities of gait and mobility (R26.89);Pain Pain - Right/Left: Right Pain - part of body: Hip     Time: 5797-2820 PT Time Calculation (min) (ACUTE ONLY): 14 min  Charges:  $Therapeutic Activity: 8-22 mins                    Cricket Goodlin B. [place], [place], DPT [phone]    10/22/2017, 3:58 PM

## 2017-10-22 NOTE — Progress Notes (Signed)
PT Cancellation Note  Patient Details Name: [name] MRN: [id number] DOB: Mar 18, 1945   Cancelled Treatment:    Reason Eval/Treat Not Completed: Patient at procedure or test/unavailable.  Pt at HD.  PT will attempt back later today or tomorrow.  Thanks,    Barbarann Ehlers. [place], [place], DPT [phone]   10/22/2017, 10:03 AM

## 2017-10-22 NOTE — Progress Notes (Addendum)
PROGRESS NOTE    Vanessa Webb  KGU:[id number] DOB: [birth year] DOA: 10/19/2017 PCP: Monico Blitz, MD   Brief Narrative:  72 year old with past medical history relevant for chronic atrial fibrillation on warfarin, ESRD on Tuesday/Thursday/Saturday dialysis in [place] at [facility], hypertension, hyperlipidemia, type 2 diabetes on insulin who presented after mechanical fall at dialysis with right hip fracture status post repair on 10/20/2017.   Assessment & Plan:   Principal Problem:   Closed right hip fracture, initial encounter [facility]) Active Problems:   End stage renal disease ([place])   Atherosclerosis of native arteries of the extremities with ulceration ([place])   DM (diabetes mellitus) with complications ([facility])   Atrial fibrillation, unspecified type ([place])   HTN (hypertension)   HLD (hyperlipidemia)   S/P right hip fracture   #) Right hip fracture status post intramedullary nailing right hip screw on 10/20/2017: In setting of mechanical fall. - Doing well postoperatively -Continue home warfarin -Pain control -PT is recommending [facility] inpatient rehab - Consult inpatient rehab placed however patient is requesting skilled nursing facility in [facility]  #) Right foot pain: Reported outside hospital noted possible second proximal phalanx fracture. -Foot x-ray shows no acute fracture  #) ESRD on Tuesday/Thursday/Saturday dialysis: Patient is dialyzed at [facility] in [facility]. -Nephrology consult  #) Nonobstructing coronary artery disease status post NSTEMI: Patient reports remote history with cardiac catheterization that had no need for intervention. -Continue atorvastatin 20 mg nightly - Continue metoprolol tartrate 37.5 mg twice daily  #) Hypertension/hyperlipidemia: -Continue statin -Continue beta-blocker -Continue amlodipine 2.5 mg on Monday, Wednesday, Friday, Sunday  #) Type 2 diabetes: Only on sliding scale glargine at home -Sensitive sliding  scale, AC at bedtime  #) Chronic atrial fibrillation: -Restarting home warfarin  Fluids: Tolerating p.o. Electrolytes s: Monitor and supplement  nutrition: Renal/carb restricted diet  Prophylaxis: Home warfarin  Disposition: Pending evaluation by [facility] inpatient rehab versus nursing facility and in [facility]  Full code   Consultants:   Orthopedic surgery  Nephrology  Procedures:   10/20/2017 pending surgery for hip  Antimicrobials:   Perioperative antibiotics   Subjective: Patient reports that she is doing well.  Her husband is optimistic about discharge however on review of the chart physical therapy feels that patient would be a good candidate for inpatient rehab evaluation as she is quite tenuous and there is concern about the husband managing her at home.  Objective: Vitals:   10/22/17 1000 10/22/17 1030 10/22/17 1045 10/22/17 1155  BP: (!) 122/41 (!) 77/56 109/60 (!) 120/45  Pulse: (!) 58 73 62 70  Resp:    18  Temp:    (!) 97.5 F (36.4 C)  TempSrc:    Oral  SpO2:    98%  Weight:      Height:        Intake/Output Summary (Last 24 hours) at 10/22/2017 1442 Last data filed at 10/22/2017 1045 Gross per 24 hour  Intake 150 ml  Output 800 ml  Net -650 ml   Filed Weights   10/19/17 1934 10/19/17 2324 10/22/17 0650  Weight: 51.3 kg 51.3 kg 52.3 kg    Examination:  General exam: Appears calm and comfortable  Respiratory system: Clear to auscultation. Respiratory effort normal. Cardiovascular system: Irregularly irregular, 2 out of 6 referred systolic murmur Gastrointestinal system: Abdomen is nondistended, soft and nontender. No organomegaly or masses felt. Normal bowel sounds heard. Central nervous system: Alert and oriented. No focal neurological deficits. Extremities: No lower extremity edema,  right upper extremity fistula with palpable thrill and audible bruit.  Intact distal pulses of her right lower extremity,  Skin: Incision site is clean  dry and intact Psychiatry: Judgement and insight appear normal. Mood & affect appropriate.     Data Reviewed: I have personally reviewed following labs and imaging studies  CBC: Recent Labs  Lab 10/19/17 1704 10/20/17 0505 10/21/17 0405 10/22/17 0422  WBC 8.0 6.2 7.8 6.6  HGB 12.1 10.8* 10.7* 9.6*  HCT 38.8 34.6* 34.3* 30.1*  MCV 87.2 87.4 88.4 86.2  PLT 98* 83* 82* 970*   Basic Metabolic Panel: Recent Labs  Lab 10/19/17 1704 10/20/17 0505 10/21/17 0405 10/22/17 0422  NA 138 139 137 135  K 5.5* 4.5 4.8 4.5  CL 99 99 100 96*  CO2 27 29 22 26   GLUCOSE 132* 98 169* 122*  BUN 28* 12 31* 52*  CREATININE 6.06* 3.54* 5.24* 6.26*  CALCIUM 8.3* 8.2* 7.7* 7.9*  MG 1.9  --   --   --   PHOS  --   --  7.9*  --    GFR: Estimated Creatinine Clearance: 6.6 mL/min (A) (by C-G formula based on SCr of 6.26 mg/dL (H)). Liver Function Tests: Recent Labs  Lab 10/19/17 1704 10/21/17 0405  AST 127*  --   ALT 75*  --   ALKPHOS 91  --   BILITOT 1.2  --   PROT 7.1  --   ALBUMIN 3.2* 2.7*   No results for input(s): LIPASE, AMYLASE in the last 168 hours. No results for input(s): AMMONIA in the last 168 hours. Coagulation Profile: Recent Labs  Lab 10/19/17 1704 10/20/17 0505 10/21/17 0405 10/22/17 0422  INR 1.39 1.50 1.55 2.24   Cardiac Enzymes: No results for input(s): CKTOTAL, CKMB, CKMBINDEX, TROPONINI in the last 168 hours. BNP (last 3 results) No results for input(s): PROBNP in the last 8760 hours. HbA1C: No results for input(s): HGBA1C in the last 72 hours. CBG: Recent Labs  Lab 10/21/17 0753 10/21/17 1223 10/21/17 1654 10/21/17 2156 10/22/17 1158  GLUCAP 144* 153* 189* 150* 76   Lipid Profile: No results for input(s): CHOL, HDL, LDLCALC, TRIG, CHOLHDL, LDLDIRECT in the last 72 hours. Thyroid Function Tests: No results for input(s): TSH, T4TOTAL, FREET4, T3FREE, THYROIDAB in the last 72 hours. Anemia Panel: No results for input(s): VITAMINB12, FOLATE,  FERRITIN, TIBC, IRON, RETICCTPCT in the last 72 hours. Sepsis Labs: No results for input(s): PROCALCITON, LATICACIDVEN in the last 168 hours.  Recent Results (from the past 240 hour(s))  Surgical pcr screen     Status: Abnormal   Collection Time: 10/19/17  4:01 PM  Result Value Ref Range Status   MRSA, PCR NEGATIVE NEGATIVE Final   Staphylococcus aureus POSITIVE (A) NEGATIVE Final    Comment: (NOTE) The Xpert SA Assay (FDA approved for NASAL specimens in patients 64 years of age and older), is one component of a comprehensive surveillance program. It is not intended to diagnose infection nor to guide or monitor treatment. Performed at [facility], [place] [address] [place], [place] [address]          Radiology Studies: Dg C-arm 1-60 Min  Result Date: 10/20/2017 CLINICAL DATA:  Right hip ORIF EXAM: DG C-ARM 61-120 MIN; RIGHT FEMUR 2 VIEWS COMPARISON:  None. FINDINGS: Screws been placed across the right femoral neck. An intramedullary screw extends through the femur, extending distally. No distal interlocking screw noted. IMPRESSION: Hardware placement as above. Electronically Signed   By: Shanon Brow  Mee Hives M.D   On: 10/20/2017 16:59   Dg Femur, Min 2 Views Right  Result Date: 10/20/2017 CLINICAL DATA:  Right hip ORIF EXAM: DG C-ARM 61-120 MIN; RIGHT FEMUR 2 VIEWS COMPARISON:  None. FINDINGS: Screws been placed across the right femoral neck. An intramedullary screw extends through the femur, extending distally. No distal interlocking screw noted. IMPRESSION: Hardware placement as above. Electronically Signed   By: Dorise Bullion III M.D   On: 10/20/2017 16:59        Scheduled Meds: . amLODipine  2.5 mg Oral Q M,W,F,Su-1800  . atorvastatin  20 mg Oral QHS  . calcium acetate  1,334 mg Oral TID WC  . Chlorhexidine Gluconate Cloth  6 each Topical Q0600  . Chlorhexidine Gluconate Cloth  6 each Topical Daily  . darbepoetin (ARANESP) injection - DIALYSIS  40 mcg Intravenous  Q Tue-HD  . docusate sodium  100 mg Oral BID  . insulin aspart  0-5 Units Subcutaneous QHS  . insulin aspart  0-9 Units Subcutaneous TID WC  . metoprolol tartrate  25 mg Oral BID  . mupirocin ointment  1 application Nasal BID  . sodium chloride flush  3 mL Intravenous Q12H  . warfarin  1 mg Oral ONCE-1800  . Warfarin - Pharmacist Dosing Inpatient   Does not apply q1800   Continuous Infusions: . sodium chloride    . sodium chloride       LOS: 3 days    Time spent: [place], MD [facility]  If 7PM-7AM, please contact night-coverage [email or web address] Password [email or web address] 10/22/2017, 2:42 PM

## 2017-10-22 NOTE — Progress Notes (Addendum)
Inpatient Rehabilitation Admissions Coordinator  I contacted [facility], PT to clarify intent for prescreen. I have requested that Therapy discuss with patient and her spouse their wishes for either d/c home or to be considered for a rehab venue i.e. CIR or SNF prior to d/c home. I await that clarification before proceeding. Noted plans for D/C pending. I will not proceed at this time until clarification made. Addendum: Noted MD states patient requesting placement in [place] therefore SW consult placed by MD.  Danne [place], RN, MSN Rehab Admissions Coordinator [phone] 10/22/2017 3:16 PM

## 2017-10-22 NOTE — Procedures (Signed)
Patient seen on Hemodialysis- uncomfortable with some post-op pain. QB 400, UF goal 0.5L  Elmarie Shiley MD [facility]. Office # [phone] Pager # [phone] 9:54 AM

## 2017-10-22 NOTE — Progress Notes (Signed)
Patient ID: Vanessa Webb, female   DOB: [birth year], 72 y.o.   MRN: [id number]  [facility] Progress Note   Assessment/ Plan:   1.  Closed right hip fracture: POD #2 s/p ORIF with intramedullary nail/hip screw placement.  Plans noted for possible DC home later today based on assessment by  PT/OT. 2. ESRD: Continue hemodialysis on her usual outpatient schedule of Tuesday, Thursday and Saturday with hemodialysis today.  Appears under her EDW and will order only minimal UF. 3. Anemia: decreasing hemoglobin level noted- will start ESA. 4. CKD-MBD: Hyperphosphatemia - started yesterday on calcium acetate for phosphorus binding. 5. Nutrition: Low albumin level noted-indeed may be an acute phase reactant following recent fracture, supplement diet with Nepro. 6. Hypertension: Blood pressures mildly elevated-possibly compounded by pain, continue amlodipine.  Subjective:   Reports some pain over surgical site   Objective:   BP (!) 106/35   Pulse 61   Temp 97.9 F (36.6 C) (Oral)   Resp 16   Ht 5\' 4"  (1.626 m)   Wt 52.3 kg   SpO2 94%   BMI 19.79 kg/m   Physical Exam: Gen: Appears somewhat uncomfortable resting on dialysis CVS: Pulse regular rhythm, normal rate, S1 and S2 normal Resp: Clear to auscultation, no rales/rhonchi Abd: Soft, flat, nontender Ext: No lower extremity edema  Labs: BMET Recent Labs  Lab 10/19/17 1704 10/20/17 0505 10/21/17 0405 10/22/17 0422  NA 138 139 137 135  K 5.5* 4.5 4.8 4.5  CL 99 99 100 96*  CO2 27 29 22 26   GLUCOSE 132* 98 169* 122*  BUN 28* 12 31* 52*  CREATININE 6.06* 3.54* 5.24* 6.26*  CALCIUM 8.3* 8.2* 7.7* 7.9*  PHOS  --   --  7.9*  --    CBC Recent Labs  Lab 10/19/17 1704 10/20/17 0505 10/21/17 0405 10/22/17 0422  WBC 8.0 6.2 7.8 6.6  HGB 12.1 10.8* 10.7* 9.6*  HCT 38.8 34.6* 34.3* 30.1*  MCV 87.2 87.4 88.4 86.2  PLT 98* 83* 82* 103*   Medications:    . amLODipine  2.5 mg Oral Q M,W,F,Su-1800  . atorvastatin   20 mg Oral QHS  . calcium acetate  1,334 mg Oral TID WC  . Chlorhexidine Gluconate Cloth  6 each Topical Q0600  . Chlorhexidine Gluconate Cloth  6 each Topical Daily  . docusate sodium  100 mg Oral BID  . insulin aspart  0-5 Units Subcutaneous QHS  . insulin aspart  0-9 Units Subcutaneous TID WC  . metoprolol tartrate  25 mg Oral BID  . mupirocin ointment  1 application Nasal BID  . sodium chloride flush  3 mL Intravenous Q12H  . warfarin  1 mg Oral ONCE-1800  . Warfarin - Pharmacist Dosing Inpatient   Does not apply [id number]   Elmarie Shiley, MD 10/22/2017, 9:56 AM

## 2017-10-22 NOTE — Progress Notes (Signed)
ANTICOAGULATION CONSULT NOTE - Initial Consult  Pharmacy Consult for Warfarin Indication: atrial fibrillation  Allergies  Allergen Reactions  . Sulfa Antibiotics Nausea Only    nausea   Patient Measurements: Height: 5\' 4"  (162.6 cm) Weight: 113 lb 1.5 oz (51.3 kg) IBW/kg (Calculated) : 54.7 Heparin Dosing Weight: 51 kg  Vital Signs: Temp: 97.8 F (36.6 C) (08/27 0400) Temp Source: Oral (08/27 0400) BP: 135/47 (08/27 0400) Pulse Rate: 68 (08/27 0400)  Labs: Recent Labs    10/19/17 1704 10/20/17 0505 10/21/17 0405 10/22/17 0422  HGB 12.1 10.8* 10.7* 9.6*  HCT 38.8 34.6* 34.3* 30.1*  PLT 98* 83* 82* 103*  APTT 33  --   --   --   LABPROT 16.9* 18.0* 18.4* 24.6*  INR 1.39 1.50 1.55 2.24  CREATININE 6.06* 3.54* 5.24* 6.26*   Estimated Creatinine Clearance: 6.5 mL/min (A) (by C-G formula based on SCr of 6.26 mg/dL (H)).  Assessment: 30 yoF presenting s/p mechanical fall with R hip fracture, PMH afib on warfarin PTA. Pharmacy consulted to resume warfarin dosing. Hgb 12.1>>10.7>>9.6, pltc 103. No bleeding noted.  INR with quick jump to 2.24 (therapeutic). Due to fast increase after only 2 doses of 5mg , will give smaller dose today to try to prevent supratherapeutic INR tomorrow.   PTA warfarin dose: 2.5mg  TTSa, 5mg  MWF (last dose 8/23 PTA, admit INR 1.5)  Goal of Therapy:  INR 2-3 Monitor platelets by anticoagulation protocol: Yes   Plan:  Warfarin 1mg  PO x1 Daily INR  Monitor s/sx of bleeding  Carliss Porcaro A. Levada Dy, PharmD, [place] Pager: [phone] Please utilize Amion for appropriate phone number to reach the unit pharmacist ([place])   10/22/2017,7:45 AM

## 2017-10-23 LAB — PROTIME-INR
INR: 2.08
PROTHROMBIN TIME: 23.2 s — AB (ref 11.4–15.2)

## 2017-10-23 LAB — CBC
HCT: 29.9 % — ABNORMAL LOW (ref 36.0–46.0)
HEMOGLOBIN: 9 g/dL — AB (ref 12.0–15.0)
MCH: 27.4 pg (ref 26.0–34.0)
MCHC: 30.1 g/dL (ref 30.0–36.0)
MCV: 91.2 fL (ref 78.0–100.0)
Platelets: 94 10*3/uL — ABNORMAL LOW (ref 150–400)
RBC: 3.28 MIL/uL — AB (ref 3.87–5.11)
RDW: 14.4 % (ref 11.5–15.5)
WBC: 6 10*3/uL (ref 4.0–10.5)

## 2017-10-23 LAB — GLUCOSE, CAPILLARY
GLUCOSE-CAPILLARY: 97 mg/dL (ref 70–99)
Glucose-Capillary: 100 mg/dL — ABNORMAL HIGH (ref 70–99)
Glucose-Capillary: 104 mg/dL — ABNORMAL HIGH (ref 70–99)
Glucose-Capillary: 108 mg/dL — ABNORMAL HIGH (ref 70–99)

## 2017-10-23 LAB — RENAL FUNCTION PANEL
Albumin: 2.5 g/dL — ABNORMAL LOW (ref 3.5–5.0)
Anion gap: 11 (ref 5–15)
BUN: 21 mg/dL (ref 8–23)
CO2: 27 mmol/L (ref 22–32)
Calcium: 7.6 mg/dL — ABNORMAL LOW (ref 8.9–10.3)
Chloride: 96 mmol/L — ABNORMAL LOW (ref 98–111)
Creatinine, Ser: 3.82 mg/dL — ABNORMAL HIGH (ref 0.44–1.00)
GFR calc Af Amer: 12 mL/min — ABNORMAL LOW (ref >60)
GFR calc non Af Amer: 11 mL/min — ABNORMAL LOW (ref >60)
Glucose, Bld: 106 mg/dL — ABNORMAL HIGH (ref 70–99)
Phosphorus: 3.9 mg/dL (ref 2.5–4.6)
Potassium: 3.9 mmol/L (ref 3.5–5.1)
Sodium: 134 mmol/L — ABNORMAL LOW (ref 135–145)

## 2017-10-23 MED ORDER — OXYCODONE HCL 5 MG PO TABS: 5.0000 mg | ORAL_TABLET | ORAL | 0 refills | Status: DC | PRN

## 2017-10-23 MED ORDER — WARFARIN SODIUM 2.5 MG PO TABS
2.5000 mg | ORAL_TABLET | Freq: Once | ORAL | Status: AC
  Administered 2017-10-23: 2.5 mg via ORAL
  Filled 2017-10-23: qty 1

## 2017-10-23 NOTE — Discharge Instructions (Signed)
Warfarin tablets What is this medicine? WARFARIN (WAR far in) is an anticoagulant. It is used to treat or prevent clots in the veins, arteries, lungs, or heart. This medicine may be used for other purposes; ask your health care provider or pharmacist if you have questions. COMMON BRAND NAME(S): Coumadin, Jantoven What should I tell my health care provider before I take this medicine? They need to know if you have any of these conditions: -alcoholism -anemia -bleeding disorders -cancer -diabetes -heart disease -high blood pressure -history of bleeding in the gastrointestinal tract -history of stroke or other brain injury or disease -kidney or liver disease -protein C deficiency -protein S deficiency -psychosis or dementia -recent injury, recent or planned surgery or procedure -an unusual or allergic reaction to warfarin, other medicines, foods, dyes, or preservatives -pregnant or trying to get pregnant -breast-feeding How should I use this medicine? Take this medicine by mouth with a glass of water. Follow the directions on the prescription label. You can take this medicine with or without food. Take your medicine at the same time each day. Do not take it more often than directed. Do not stop taking except on your doctor's advice. Stopping this medicine may increase your risk of a blood clot. Be sure to refill your prescription before you run out of medicine. If your doctor or healthcare professional calls to change your dose, write down the dose and any other instructions. Always read the dose and instructions back to him or her to make sure you understand them. Tell your doctor or healthcare professional what strength of tablets you have on hand. Ask how many tablets you should take to equal your new dose. Write the date on the new instructions and keep them near your medicine. If you are told to stop taking your medicine until your next blood test, call your doctor or healthcare  professional if you do not hear anything within 24 hours of the test to find out your new dose or when to restart your prior dose. A special MedGuide will be given to you by the pharmacist with each prescription and refill. Be sure to read this information carefully each time. Talk to your pediatrician regarding the use of this medicine in children. Special care may be needed. Overdosage: If you think you have taken too much of this medicine contact a poison control center or emergency room at once. NOTE: This medicine is only for you. Do not share this medicine with others. What if I miss a dose? It is important not to miss a dose. If you miss a dose, call your healthcare provider. Take the dose as soon as possible on the same day. If it is almost time for your next dose, take only that dose. Do not take double or extra doses to make up for a missed dose. What may interact with this medicine? Do not take this medicine with any of the following medications: -agents that prevent or dissolve blood clots -aspirin or other salicylates -danshen -dextrothyroxine -mifepristone -St. John's Wort -red yeast rice This medicine may also interact with the following medications: -acetaminophen -agents that lower cholesterol -alcohol -allopurinol -amiodarone -antibiotics or medicines for treating bacterial, fungal or viral infections -azathioprine -barbiturate medicines for inducing sleep or treating seizures -certain medicines for diabetes -certain medicines for heart rhythm problems -certain medicines for hepatitis C virus infections like daclatasvir, dasabuvir; ombitasvir; paritaprevir; ritonavir, elbasvir; grazoprevir, ledipasvir; sofosbuvir, simeprevir, sofosbuvir, sofosbuvir; velpatasvir, sofosbuvir; velpatasvir; voxilaprevir -certain medicines for high blood pressure -chloral hydrate -  cisapride -conivaptan -disulfiram -female hormones, including contraceptive or birth control pills -general  anesthetics -herbal or dietary products like garlic, ginkgo, ginseng, green tea, or kava kava -influenza virus vaccine -female hormones -medicines for mental depression or psychosis -medicines for some types of cancer -medicines for stomach problems -methylphenidate -NSAIDs, medicines for pain and inflammation, like ibuprofen or naproxen -propoxyphene -quinidine, quinine -raloxifene -seizure or epilepsy medicine like carbamazepine, phenytoin, and valproic acid -steroids like cortisone and prednisone -tamoxifen -thyroid medicine -tramadol -vitamin c, vitamin e, and vitamin K -zafirlukast -zileuton This list may not describe all possible interactions. Give your health care provider a list of all the medicines, herbs, non-prescription drugs, or dietary supplements you use. Also tell them if you smoke, drink alcohol, or use illegal drugs. Some items may interact with your medicine. What should I watch for while using this medicine? Visit your doctor or health care professional for regular checks on your progress. You will need to have a blood test called a PT/INR regularly. The PT/INR blood test is done to make sure you are getting the right dose of this medicine. It is important to not miss your appointment for the blood tests. When you first start taking this medicine, these tests are done often. Once the correct dose is determined and you take your medicine properly, these tests can be done less often. Notify your doctor or health care professional and seek emergency treatment if you develop breathing problems; changes in vision; chest pain; severe, sudden headache; pain, swelling, warmth in the leg; trouble speaking; sudden numbness or weakness of the face, arm or leg. These can be signs that your condition has gotten worse. While you are taking this medicine, carry an identification card with your name, the name and dose of medicine(s) being used, and the name and phone number of your doctor  or health care professional or person to contact in an emergency. Do not start taking or stop taking any medicines or over-the-counter medicines except on the advice of your doctor or health care professional. You should discuss your diet with your doctor or health care professional. Do not make major changes in your diet. Vitamin K can affect how well this medicine works. Many foods contain vitamin K. It is important to eat a consistent amount of foods with vitamin K. Other foods with vitamin K that you should eat in consistent amounts are asparagus, basil, black eyed peas, broccoli, brussel sprouts, cabbage, green onions, green tea, parsley, green leafy vegetables like beet greens, collard greens, kale, spinach, turnip greens, or certain lettuces like green leaf or romaine. This medicine can cause birth defects or bleeding in an unborn child. Women of childbearing age should use effective birth control while taking this medicine. If a woman becomes pregnant while taking this medicine, she should discuss the potential risks and her options with her health care professional. Avoid sports and activities that might cause injury while you are using this medicine. Severe falls or injuries can cause unseen bleeding. Be careful when using sharp tools or knives. Consider using an Copy. Take special care brushing or flossing your teeth. Report any injuries, bruising, or red spots on the skin to your doctor or health care professional. If you have an illness that causes vomiting, diarrhea, or fever for more than a few days, contact your doctor. Also check with your doctor if you are unable to eat for several days. These problems can change the effect of this medicine. Even after you stop taking this  medicine, it takes several days before your body recovers its normal ability to clot blood. Ask your doctor or health care professional how long you need to be careful. If you are going to have surgery or dental  work, tell your doctor or health care professional that you have been taking this medicine. What side effects may I notice from receiving this medicine? Side effects that you should report to your doctor or health care professional as soon as possible: -allergic reactions like skin rash, itching or hives, swelling of the face, lips, or tongue -breathing problems -chest pain -dizziness -headache -heavy menstrual bleeding or vaginal bleeding -pain in the lower back or side -painful, blue or purple toes -painful skin ulcers that do not go away -signs and symptoms of bleeding such as bloody or black, tarry stools; red or dark-brown urine; spitting up blood or brown material that looks like coffee grounds; red spots on the skin; unusual bruising or bleeding from the eye, gums, or nose -stomach pain -unusually weak or tired Side effects that usually do not require medical attention (report to your doctor or health care professional if they continue or are bothersome): -diarrhea -hair loss This list may not describe all possible side effects. Call your doctor for medical advice about side effects. You may report side effects to FDA at 1-800-FDA-1088. Where should I keep my medicine? Keep out of the reach of children. Store at room temperature between 15 and 30 degrees C (59 and 86 degrees F). Protect from light. Throw away any unused medicine after the expiration date. Do not flush down the toilet. NOTE: This sheet is a summary. It may not cover all possible information. If you have questions about this medicine, talk to your doctor, pharmacist, or health care provider.  2018 Elsevier/Gold Standard (2016-02-02 11:27:41)    CAN PUT FULL WEIGHT AS TOLERATED ON RIGHT HIP. CAN GET CURRENT DRESSING WET IN THE SHOWER DAILY.

## 2017-10-23 NOTE — Progress Notes (Signed)
Patient ID: Vanessa Webb, female   DOB: Jun 28, 1945, 72 y.o.   MRN: [id number]  [facility] Progress Note   Assessment/ Plan:   1.  Closed right hip fracture: POD #3 s/p ORIF with intramedullary nail/hip screw placement.  Plans noted for possible DC to skilled nursing facility later today based on assessment by  PT/OT. 2. ESRD: Continue hemodialysis on her usual outpatient schedule of Tuesday, Thursday and Saturday with hemodialysis if she is still here.  Clinically she appears to be euvolemic and without any acute indications for dialysis today. 3. Anemia: decreasing hemoglobin level noted- will start ESA. 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 5. Nutrition: Low albumin level noted-indeed may be an acute phase reactant following recent fracture, supplement diet with Nepro. 6. Hypertension: Blood pressures mildly elevated-possibly compounded by pain, continue amlodipine.  Subjective:   Reports occasional discomfort with her right hip especially with movement   Objective:   BP (!) 155/50 (BP Location: Left Arm)   Pulse 73   Temp 97.8 F (36.6 C) (Oral)   Resp 20   Ht 5\' 4"  (1.626 m)   Wt 52.3 kg   SpO2 97%   BMI 19.79 kg/m   Physical Exam: Gen: Appears comfortable resting in recliner CVS: Pulse regular rhythm, normal rate, S1 and S2 normal Resp: Clear to auscultation, no rales/rhonchi Abd: Soft, flat, nontender Ext: No lower extremity edema  Labs: BMET Recent Labs  Lab 10/19/17 1704 10/20/17 0505 10/21/17 0405 10/22/17 0422 10/23/17 0359  NA 138 139 137 135 134*  K 5.5* 4.5 4.8 4.5 3.9  CL 99 99 100 96* 96*  CO2 27 29 22 26 27   GLUCOSE 132* 98 169* 122* 106*  BUN 28* 12 31* 52* 21  CREATININE 6.06* 3.54* 5.24* 6.26* 3.82*  CALCIUM 8.3* 8.2* 7.7* 7.9* 7.6*  PHOS  --   --  7.9*  --  3.9   CBC Recent Labs  Lab 10/20/17 0505 10/21/17 0405 10/22/17 0422 10/23/17 0359  WBC 6.2 7.8 6.6 6.0  HGB 10.8* 10.7* 9.6* 9.0*   HCT 34.6* 34.3* 30.1* 29.9*  MCV 87.4 88.4 86.2 91.2  PLT 83* 82* 103* 94*   Medications:    . amLODipine  2.5 mg Oral Q M,W,F,Su-1800  . atorvastatin  20 mg Oral QHS  . calcium acetate  1,334 mg Oral TID WC  . Chlorhexidine Gluconate Cloth  6 each Topical Q0600  . Chlorhexidine Gluconate Cloth  6 each Topical Daily  . darbepoetin (ARANESP) injection - DIALYSIS  40 mcg Intravenous Q Tue-HD  . docusate sodium  100 mg Oral BID  . insulin aspart  0-5 Units Subcutaneous QHS  . insulin aspart  0-9 Units Subcutaneous TID WC  . metoprolol tartrate  25 mg Oral BID  . mupirocin ointment  1 application Nasal BID  . sodium chloride flush  3 mL Intravenous Q12H  . warfarin  2.5 mg Oral ONCE-1800  . Warfarin - Pharmacist Dosing Inpatient   Does not apply [id number]   Elmarie Shiley, MD 10/23/2017, 11:11 AM

## 2017-10-23 NOTE — Care Management Important Message (Signed)
Important Message  Patient Details  Name: Vanessa Webb MRN: [id number] Date of Birth: 1945-07-22   Medicare Important Message Given:  Yes    Zyairah Wacha [address] 10/23/2017, 1:54 PM

## 2017-10-23 NOTE — NC FL2 (Signed)
[place] MEDICAID FL2 LEVEL OF CARE SCREENING TOOL     IDENTIFICATION  Patient Name: Vanessa Webb Birthdate: 08/26/1945 Sex: female Admission Date (Current Location): 10/19/2017  [facility] and [place] Number:  [employer] and Address:  [facility] [place], [place] [address], [place], [place] [address]      Provider Number: [id number]  Attending Physician Name and Address:  Shelly Coss, MD  Relative Name and Phone Number:  Clarnce Flock -Daughter; Murlean Seelye - [phone]    Current Level of Care: Hospital Recommended Level of Care: [place] Prior Approval Number:    Date Approved/Denied:   PASRR Number: [id number](Eff. 10/23/17)  Discharge Plan: SNF    Current Diagnoses: Patient Active Problem List   Diagnosis Date Noted  . Closed right hip fracture, initial encounter ([place]) 10/19/2017  . HTN (hypertension) 10/19/2017  . HLD (hyperlipidemia) 10/19/2017  . S/P right hip fracture 10/19/2017  . Atrial fibrillation, unspecified type ([place]) 04/13/2016  . Atherosclerosis of native arteries of the extremities with ulceration ([place]) 03/18/2015  . 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 End stage renal disease ([place]) 08/26/2012    Orientation RESPIRATION BLADDER Height & Weight     Self, Time, Situation, Place  Normal Continent Weight: 115 lb 4.8 oz (52.3 kg) Height:  5\' 4"  (162.6 cm)  BEHAVIORAL SYMPTOMS/MOOD NEUROLOGICAL BOWEL NUTRITION STATUS      Continent Diet(Low sodium - Heart healthy)  AMBULATORY STATUS COMMUNICATION OF NEEDS Skin   Extensive Assist(Mod assist per OT) Verbally Other (Comment)(Incision right hip, Abrasion right knee, Ecchymosis right foot)                       Personal Care Assistance Level of Assistance  Bathing, Feeding, Dressing Bathing Assistance: Maximum assistance(Mod assist per OT) Feeding assistance: Limited assistance(Assistance with set-up) Dressing  Assistance: Maximum assistance(Mod Assist per OT)     Functional Limitations Info  Sight, Hearing, Speech Sight Info: Adequate Hearing Info: Adequate Speech Info: Adequate    SPECIAL CARE FACTORS FREQUENCY  PT (By licensed PT), OT (By licensed OT)     PT Frequency: Evaluated 8/26 and a minimum of 3X per week therapy recommended during acute inpatient stay OT Frequency: Evaluated 8/26 and a minimum of 6X per week therapy recommended during acute inpatient stay            Contractures Contractures Info: Not present    Additional Factors Info  Code Status, Allergies, Insulin Sliding Scale Code Status Info: Full Allergies Info: Sulfa antibiotics   Insulin Sliding Scale Info: 0-5 Units daily at bedtime; 0-9 Units 3X per day with meals       Current Medications (10/23/2017):  This is the current hospital active medication list Current Facility-Administered Medications  Medication Dose Route Frequency Provider Last Rate Last Dose  . 0.9 %  sodium chloride infusion  250 mL Intravenous PRN Mcarthur Rossetti, MD      . 0.9 %  sodium chloride infusion  100 mL Intravenous PRN Mcarthur Rossetti, MD      . acetaminophen (TYLENOL) tablet 650 mg  650 mg Oral Q6H PRN Mcarthur Rossetti, MD       Or  . acetaminophen (TYLENOL) suppository 650 mg  650 mg Rectal Q6H PRN Mcarthur Rossetti, MD      . amLODipine (NORVASC) tablet 2.5 mg  2.5 mg Oral Q M,W,F,Su-1800 Mcarthur Rossetti, MD   2.5 mg at 10/21/17 1800  .  atorvastatin (LIPITOR) tablet 20 mg  20 mg Oral QHS Mcarthur Rossetti, MD   20 mg at 10/22/17 2226  . calcium acetate (PHOSLO) capsule 1,334 mg  1,334 mg Oral TID WC Elmarie Shiley, MD   1,334 mg at 10/23/17 0835  . Chlorhexidine Gluconate Cloth 2 % PADS 6 each  6 each Topical Q0600 Mcarthur Rossetti, MD   6 each at 10/22/17 0430  . Chlorhexidine Gluconate Cloth 2 % PADS 6 each  6 each Topical Daily Purohit, Shrey C, MD      . Darbepoetin Alfa  (ARANESP) injection 40 mcg  40 mcg Intravenous Q Jayme Cloud, MD   40 mcg at 10/22/17 1040  . diphenhydrAMINE (BENADRYL) capsule 25 mg  25 mg Oral Q4H PRN Purohit, Konrad Dolores, MD   25 mg at 10/21/17 1129  . diphenoxylate-atropine (LOMOTIL) 2.5-0.025 MG per tablet 1 tablet  1 tablet Oral QID PRN Mcarthur Rossetti, MD      . docusate sodium (COLACE) capsule 100 mg  100 mg Oral BID Mcarthur Rossetti, MD   100 mg at 10/22/17 2227  . hydrALAZINE (APRESOLINE) injection 10-20 mg  10-20 mg Intravenous Q4H PRN Mcarthur Rossetti, MD      . HYDROmorphone (DILAUDID) injection 0.5 mg  0.5 mg Intravenous Q3H PRN Mcarthur Rossetti, MD   0.5 mg at 10/22/17 0948  . insulin aspart (novoLOG) injection 0-5 Units  0-5 Units Subcutaneous QHS Mcarthur Rossetti, MD      . insulin aspart (novoLOG) injection 0-9 Units  0-9 Units Subcutaneous TID WC Mcarthur Rossetti, MD   2 Units at 10/21/17 1802  . lidocaine-prilocaine (EMLA) cream 1 application  1 application Topical PRN Mcarthur Rossetti, MD      . menthol-cetylpyridinium (CEPACOL) lozenge 3 mg  1 lozenge Oral PRN Mcarthur Rossetti, MD       Or  . phenol (CHLORASEPTIC) mouth spray 1 spray  1 spray Mouth/Throat PRN Mcarthur Rossetti, MD      . metoCLOPramide (REGLAN) tablet 5-10 mg  5-10 mg Oral Q8H PRN Mcarthur Rossetti, MD       Or  . metoCLOPramide (REGLAN) injection 5-10 mg  5-10 mg Intravenous Q8H PRN Mcarthur Rossetti, MD      . metoprolol tartrate (LOPRESSOR) tablet 25 mg  25 mg Oral BID Mcarthur Rossetti, MD   25 mg at 10/22/17 2226  . morphine 2 MG/ML injection 0.5-1 mg  0.5-1 mg Intravenous Q2H PRN Mcarthur Rossetti, MD      . mupirocin ointment (BACTROBAN) 2 % 1 application  1 application Nasal BID Purohit, Konrad Dolores, MD   1 application at 16/60/63 2227  . ondansetron (ZOFRAN) tablet 4 mg  4 mg Oral Q6H PRN Mcarthur Rossetti, MD       Or  . ondansetron [facility]) injection 4 mg   4 mg Intravenous Q6H PRN Mcarthur Rossetti, MD      . oxyCODONE (Oxy IR/ROXICODONE) immediate release tablet 5 mg  5 mg Oral Q4H PRN Purohit, Konrad Dolores, MD   5 mg at 10/23/17 0835  . pentafluoroprop-tetrafluoroeth (GEBAUERS) aerosol 1 application  1 application Topical PRN Mcarthur Rossetti, MD      . polyethylene glycol (MIRALAX / GLYCOLAX) packet 17 g  17 g Oral Daily PRN Mcarthur Rossetti, MD      . sodium chloride flush (NS) 0.9 % injection 3 mL  3 mL Intravenous Q12H Mcarthur Rossetti, MD  3 mL at 10/22/17 2228  . sodium chloride flush (NS) 0.9 % injection 3 mL  3 mL Intravenous PRN Mcarthur Rossetti, MD      . warfarin (COUMADIN) tablet 2.5 mg  2.5 mg Oral ONCE-1800 Pierce, Dwayne A, RPH      . Warfarin - Pharmacist Dosing Inpatient   Does not apply q1800 Purohit, Konrad Dolores, MD         Discharge Medications: Please see discharge summary for a list of discharge medications.  Relevant Imaging Results:  Relevant Lab Results:   Additional Information ss#[id number]. Dialysis patient - Javier Docker, chair time 6 am TTS  Sharlet [place]ina Mila Homer, LCSW

## 2017-10-23 NOTE — Progress Notes (Signed)
ANTICOAGULATION CONSULT NOTE - Initial Consult  Pharmacy Consult for Warfarin Indication: atrial fibrillation  Allergies  Allergen Reactions  . Sulfa Antibiotics Nausea Only    nausea   Patient Measurements: Height: 5\' 4"  (162.6 cm) Weight: 115 lb 4.8 oz (52.3 kg) IBW/kg (Calculated) : 54.7 Heparin Dosing Weight: 51 kg  Vital Signs: Temp: 97.5 F (36.4 C) (08/28 0440) Temp Source: Oral (08/28 0440) BP: 141/46 (08/28 0440) Pulse Rate: 70 (08/28 0440)  Labs: Recent Labs    10/21/17 0405 10/22/17 0422 10/23/17 0359  HGB 10.7* 9.6* 9.0*  HCT 34.3* 30.1* 29.9*  PLT 82* 103* 94*  LABPROT 18.4* 24.6* 23.2*  INR 1.55 2.24 2.08  CREATININE 5.24* 6.26* 3.82*   Estimated Creatinine Clearance: 10.8 mL/min (A) (by C-G formula based on SCr of 3.82 mg/dL (H)).  Assessment: 69 yoF presenting s/p mechanical fall with R hip fracture, PMH afib on warfarin PTA. Pharmacy consulted to resume warfarin dosing. Hgb 12.1>>10.7>>9.6, pltc 103. No bleeding noted.  INR 2.08 (therapeutic). Due to fast increase after only 2 doses of 5mg , will give smaller dose. No bleeding noted.   PTA warfarin dose: 2.5mg  TTSa, 5mg  MWF (last dose 8/23 PTA, admit INR 1.5)  Goal of Therapy:  INR 2-3 Monitor platelets by anticoagulation protocol: Yes   Plan:  Warfarin 2.5 mg PO x1 Daily INR  Monitor s/sx of bleeding  Eulene Pekar A. Levada Dy, PharmD, [place] Pager: [phone] Please utilize Amion for appropriate phone number to reach the unit pharmacist ([facility])   10/23/2017,7:48 AM

## 2017-10-23 NOTE — Progress Notes (Signed)
Physical Therapy Treatment Patient Details Name: Vanessa Webb MRN: [id number] DOB: Jan 03, 1946 Today's Date: 10/23/2017    History of Present Illness Vanessa Webb is a 72 y.o. female with medical history significant of type 2 diabetes on sliding scale insulin, hypertension, hyperlipidemia, coronary artery disease status post MI with no intervention, paroxysmal atrial fibrillation on warfarin, ESRD on Tuesday/Thursday/Saturday dialysis at [facility] in [facility] who came to the hospital after a mechanical fall at dialysis per her report, with resulting right hip fracture, possible R proximal second phalanx (managing non-operatively), now s/p IM Nailing 10/20/17. She is WBAT RLE.    PT Comments    Continuing work on functional mobility and activity tolerance;  Session focused on activity tolerance, obtained Orthostatic BPs with notable drop in SBP at standing at 0 minutes to standing at 3 minutes -- which was accompanied by symptoms of dizziness; See vitals flow sheet. RN notified   Follow Up Recommendations  SNF;Other (comment)(family wants her closer to [place], [place] where they live)     Equipment Recommendations  Rolling walker with 5" wheels;3in1 (PT);Wheelchair cushion (measurements PT);Wheelchair (measurements PT)    Recommendations for Other Services       Precautions / Restrictions Precautions Precautions: Fall Restrictions RLE Weight Bearing: Weight bearing as tolerated    Mobility  Bed Mobility Overal bed mobility: Needs Assistance Bed Mobility: Supine to Sit     Supine to sit: Min assist     General bed mobility comments: Min handheld assist to pull to sit  Transfers Overall transfer level: Needs assistance Equipment used: Rolling walker (2 wheeled) Transfers: Sit to/from Stand Sit to Stand: Min assist         General transfer comment: Cues for hand placement; min assis to power up and for steadying; cues to self-monitor for activity  tolernace  Ambulation/Gait Ambulation/Gait assistance: Min assist;Mod assist Gait Distance (Feet): 8 Feet Assistive device: Rolling walker (2 wheeled) Gait Pattern/deviations: Step-to pattern;Antalgic;Trunk flexed     General Gait Details: Cues for sequence, and to bear down into RW to unweigh painful RLE in stance; Fatigued with incr time in upright activity, and noted to be orthostatic as well   Stairs             Wheelchair Mobility    Modified Rankin (Stroke Patients Only)       Balance     Sitting balance-Leahy Scale: Fair       Standing balance-Leahy Scale: Poor                              Cognition Arousal/Alertness: Awake/alert Behavior During Therapy: Flat affect Overall Cognitive Status: Within Functional Limits for tasks assessed                                        Exercises Total Joint Exercises Ankle Circles/Pumps: AROM;Both;10 reps Heel Slides: AAROM;Right;10 reps Hip ABduction/ADduction: AAROM;Right;10 reps    General Comments General comments (skin integrity, edema, etc.):   10/23/17 0947  Orthostatic Lying   BP- Lying 149/41  Pulse- Lying 73  Orthostatic Sitting  BP- Sitting 129/44  Pulse- Sitting 75  Orthostatic Standing at 0 minutes  BP- Standing at 0 minutes (!) 123/38  Pulse- Standing at 0 minutes 79  Orthostatic Standing at 3 minutes  BP- Standing at 3 minutes 102/56  Pulse- Standing at 3  minutes 80         Pertinent Vitals/Pain Pain Assessment: Faces Faces Pain Scale: Hurts even more Pain Location: right leg and foot Pain Descriptors / Indicators: Aching;Discomfort;Grimacing Pain Intervention(s): Monitored during session;Ice applied    Home Living                      Prior Function            PT Goals (current goals can now be found in the care plan section) Acute Rehab PT Goals Patient Stated Goal: to get better PT Goal Formulation: With patient Time For Goal  Achievement: 11/04/17 Potential to Achieve Goals: Good Progress towards PT goals: Progressing toward goals(slowly, limited by orthostasis)    Frequency    Min 3X/week      PT Plan Current plan remains appropriate    Co-evaluation              AM-PAC PT "6 Clicks" Daily Activity  Outcome Measure  Difficulty turning over in bed (including adjusting bedclothes, sheets and blankets)?: Unable Difficulty moving from lying on back to sitting on the side of the bed? : Unable Difficulty sitting down on and standing up from a chair with arms (e.g., wheelchair, bedside commode, etc,.)?: Unable Help needed moving to and from a bed to chair (including a wheelchair)?: A Little Help needed walking in hospital room?: A Lot Help needed climbing 3-5 steps with a railing? : Total 6 Click Score: 9    End of Session Equipment Utilized During Treatment: Gait belt Activity Tolerance: Patient tolerated treatment well;Other (comment)(limited by orthostatic) Patient left: in chair;with call bell/phone within reach;with family/visitor present Nurse Communication: Mobility status PT Visit Diagnosis: Other abnormalities of gait and mobility (R26.89);Pain Pain - Right/Left: Right Pain - part of body: Hip     Time: 9470-9628 PT Time Calculation (min) (ACUTE ONLY): 33 min  Charges:  $Gait Training: 8-22 mins $Therapeutic Activity: 8-22 mins                     Roney Marion, PT  Acute Rehabilitation Services Pager [phone] Office [place] 10/23/2017, 1:06 PM

## 2017-10-23 NOTE — Discharge Summary (Addendum)
Physician Discharge Summary  Vanessa Webb TFT:[id number] DOB: 05-04-1945 DOA: 10/19/2017  PCP: Monico Blitz, MD  Admit date: 10/19/2017 Discharge date: 10/23/2017  Admitted From: Home Disposition:  Home  Discharge Condition:Stable CODE STATUS:FULL Diet recommendation: Heart Healthy   Brief/Interim Summary: Patient is a 72 year old with past medical history relevant for chronic atrial fibrillation on warfarin, ESRD on Tuesday/Thursday/Saturday dialysis in [place] at [facility], hypertension, hyperlipidemia, type 2 diabetes on insulin who presented after mechanical fall at dialysis with right hip fracture status post repair on 10/20/2017. Orthopedics evaluated the patient and she underwent intramedullary nailing.  PT/OT evaluated the patient and recommended to be discharged to skilled nursing facility.  She is medically stable for discharge today.  Following problems were addressed during hospitalization:  #) Right hip fracture status post intramedullary nailing right hip screw on 10/20/2017: In setting of mechanical fall. - Doing well postoperatively -Continue home warfarin -Pain control -PT is recommending SNF - SW following -She will follow up with orthopedics in 2 weeks after discharge.  #) Right foot pain: Reported outside hospital noted possible second proximal phalanx fracture. -Foot x-ray shows no acute fracture  #) ESRD on Tuesday/Thursday/Saturday dialysis: Patient is dialyzed at [facility] in [facility]. -Nephrology was folllowing  #)Nonobstructing coronary artery disease status post NSTEMI: Patient reports remote history with cardiac catheterization that had no need for intervention. -Continue atorvastatin 20 mg nightly - Continue metoprolol tartrate 37.5 mg twice daily  #) Hypertension/hyperlipidemia: -Continue statin -Continue beta-blocker -Continue amlodipine 2.5 mg on Monday, Wednesday, Friday, Sunday  #) Type 2 diabetes: Resume home  regimen.  #) Chronic atrial fibrillation: -Restarting home warfarin  Discharge Diagnoses:  Principal Problem:   Closed right hip fracture, initial encounter ([place]) Active Problems:   End stage renal disease ([place])   Atherosclerosis of native arteries of the extremities with ulceration ([place])   DM (diabetes mellitus) with complications ([place])   Atrial fibrillation, unspecified type ([place])   HTN (hypertension)   HLD (hyperlipidemia)   S/P right hip fracture    Discharge Instructions  Discharge Instructions    Diet - low sodium heart healthy   Complete by:  As directed    Discharge instructions   Complete by:  As directed    1) Follow up with orthopedics as an outpatient in 2 weeks.  Name and number of the provider has been attached.   Increase activity slowly   Complete by:  As directed      Allergies as of 10/23/2017      Reactions   Sulfa Antibiotics Nausea Only   nausea      Medication List    TAKE these medications   acetaminophen 500 MG tablet Commonly known as:  TYLENOL Take 500 mg by mouth every 6 (six) hours as needed (for pain.).   amLODipine 2.5 MG tablet Commonly known as:  NORVASC Take one tab by mouth only on non dialysis days (Monday, Wednesday, Friday, & Sunday)   atorvastatin 20 MG tablet Commonly known as:  LIPITOR Take 20 mg by mouth at bedtime.   diphenoxylate-atropine 2.5-0.025 MG tablet Commonly known as:  LOMOTIL Take 1 tablet by mouth 4 (four) times daily as needed for diarrhea or loose stools.   insulin glargine 100 UNIT/ML injection Commonly known as:  LANTUS Inject 3-15 Units into the skin at bedtime. Sliding scale per patient   lidocaine-prilocaine cream Commonly known as:  EMLA Apply 1 application topically daily as needed (prior to port being accessed).   metoprolol tartrate 25  MG tablet Commonly known as:  LOPRESSOR TAKE 1 AND 1/2 TABLETS BY MOUTH TWICE DAILY. (MORNING,BEDTIME) What changed:  See the new instructions.    oxyCODONE 5 MG immediate release tablet Commonly known as:  Oxy IR/ROXICODONE Take 1 tablet (5 mg total) by mouth every 4 (four) hours as needed for severe pain.   warfarin 2.5 MG tablet Commonly known as:  COUMADIN Take as directed. If you are unsure how to take this medication, talk to your nurse or doctor. Original instructions:  Take 1 tablet daily except 2 tablets on Mondays, Wednesdays and Fridays or as directed What changed:    how much to take  how to take this  when to take this  additional instructions      Follow-up Information    Mcarthur Rossetti, MD. Schedule an appointment as soon as possible for a visit in 2 week(s).   Specialty:  Orthopedic Surgery Contact information: [place] [address] [phone]        Monico Blitz, MD. Schedule an appointment as soon as possible for a visit in 1 week(s).   Specialty:  Internal Medicine Contact information: [address] [place] [address] [phone]          Allergies  Allergen Reactions  . Sulfa Antibiotics Nausea Only    nausea    Consultations:  Orthopedics   Procedures/Studies: Dg Foot Complete Right  Result Date: 10/19/2017 CLINICAL DATA:  Fall, medial right foot pain EXAM: RIGHT FOOT COMPLETE - 3+ VIEW COMPARISON:  None. FINDINGS: No fracture or dislocation is seen. The joint spaces are preserved. Visualized soft tissues are within normal limits. Vascular calcifications. IMPRESSION: Negative. Electronically Signed   By: Julian Hy M.D.   On: 10/19/2017 16:55   Dg C-arm 1-60 Min  Result Date: 10/20/2017 CLINICAL DATA:  Right hip ORIF EXAM: DG C-ARM 61-120 MIN; RIGHT FEMUR 2 VIEWS COMPARISON:  None. FINDINGS: Screws been placed across the right femoral neck. An intramedullary screw extends through the femur, extending distally. No distal interlocking screw noted. IMPRESSION: Hardware placement as above. Electronically Signed   By: Dorise Bullion III M.D    On: 10/20/2017 16:59   Dg Hip Unilat With Pelvis 2-3 Views Right  Result Date: 10/19/2017 CLINICAL DATA:  Fall, right hip pain EXAM: DG HIP (WITH OR WITHOUT PELVIS) 2-3V RIGHT COMPARISON:  [facility] right hip radiographs dated 10/19/2017 at 0725 hours FINDINGS: Intertrochanteric right hip fracture. Mild with foreshortening with varus angulation. Bilateral hip joint spaces are symmetric. Visualized bony pelvis appears intact. IMPRESSION: Intertrochanteric right hip fracture, as above. Electronically Signed   By: Julian Hy M.D.   On: 10/19/2017 16:54   Dg Femur, Min 2 Views Right  Result Date: 10/20/2017 CLINICAL DATA:  Right hip ORIF EXAM: DG C-ARM 61-120 MIN; RIGHT FEMUR 2 VIEWS COMPARISON:  None. FINDINGS: Screws been placed across the right femoral neck. An intramedullary screw extends through the femur, extending distally. No distal interlocking screw noted. IMPRESSION: Hardware placement as above. Electronically Signed   By: Dorise Bullion III M.D   On: 10/20/2017 16:59       Subjective: Patient seen and examined at bedside this morning.  Remains comfortable.  No active issues.  Stable for discharge to skilled nursing facility as soon as the bed is available.  Discharge Exam: Vitals:   10/23/17 0440 10/23/17 0752  BP: (!) 141/46 (!) 155/50  Pulse: 70 73  Resp: 18 20  Temp: (!) 97.5 F (36.4 C) 97.8  F (36.6 C)  SpO2: 95% 97%   Vitals:   10/22/17 1623 10/22/17 2021 10/23/17 0440 10/23/17 0752  BP: (!) 138/42 (!) 145/49 (!) 141/46 (!) 155/50  Pulse: 74 76 70 73  Resp: 18 16 18 20   Temp: 98.1 F (36.7 C) 98.8 F (37.1 C) (!) 97.5 F (36.4 C) 97.8 F (36.6 C)  TempSrc: Oral Oral Oral Oral  SpO2: 98% 99% 95% 97%  Weight:      Height:        General: Pt is alert, awake, not in acute distress Cardiovascular: RRR, S1/S2 +, no rubs, no gallops Respiratory: CTA bilaterally, no wheezing, no rhonchi Abdominal: Soft, NT, ND, bowel sounds + Extremities: no edema,  no cyanosis, clean surgical wound on the right hip    The results of significant diagnostics from this hospitalization (including imaging, microbiology, ancillary and laboratory) are listed below for reference.     Microbiology: Recent Results (from the past 240 hour(s))  Surgical pcr screen     Status: Abnormal   Collection Time: 10/19/17  4:01 PM  Result Value Ref Range Status   MRSA, PCR NEGATIVE NEGATIVE Final   Staphylococcus aureus POSITIVE (A) NEGATIVE Final    Comment: (NOTE) The Xpert SA Assay (FDA approved for NASAL specimens in patients 83 years of age and older), is one component of a comprehensive surveillance program. It is not intended to diagnose infection nor to guide or monitor treatment. Performed at [facility], [place] [address] [place], [place] [address]      Labs: BNP (last 3 results) No results for input(s): BNP in the last 8760 hours. Basic Metabolic Panel: Recent Labs  Lab 10/19/17 1704 10/20/17 0505 10/21/17 0405 10/22/17 0422 10/23/17 0359  NA 138 139 137 135 134*  K 5.5* 4.5 4.8 4.5 3.9  CL 99 99 100 96* 96*  CO2 27 29 22 26 27   GLUCOSE 132* 98 169* 122* 106*  BUN 28* 12 31* 52* 21  CREATININE 6.06* 3.54* 5.24* 6.26* 3.82*  CALCIUM 8.3* 8.2* 7.7* 7.9* 7.6*  MG 1.9  --   --   --   --   PHOS  --   --  7.9*  --  3.9   Liver Function Tests: Recent Labs  Lab 10/19/17 1704 10/21/17 0405 10/23/17 0359  AST 127*  --   --   ALT 75*  --   --   ALKPHOS 91  --   --   BILITOT 1.2  --   --   PROT 7.1  --   --   ALBUMIN 3.2* 2.7* 2.5*   No results for input(s): LIPASE, AMYLASE in the last 168 hours. No results for input(s): AMMONIA in the last 168 hours. CBC: Recent Labs  Lab 10/19/17 1704 10/20/17 0505 10/21/17 0405 10/22/17 0422 10/23/17 0359  WBC 8.0 6.2 7.8 6.6 6.0  HGB 12.1 10.8* 10.7* 9.6* 9.0*  HCT 38.8 34.6* 34.3* 30.1* 29.9*  MCV 87.2 87.4 88.4 86.2 91.2  PLT 98* 83* 82* 103* 94*   Cardiac Enzymes: No results for  input(s): CKTOTAL, CKMB, CKMBINDEX, TROPONINI in the last 168 hours. BNP: Invalid input(s): POCBNP CBG: Recent Labs  Lab 10/21/17 2156 10/22/17 1158 10/22/17 1632 10/22/17 2228 10/23/17 0754  GLUCAP 150* 76 80 118* 100*   D-Dimer No results for input(s): DDIMER in the last 72 hours. Hgb A1c No results for input(s): HGBA1C in the last 72 hours. Lipid Profile No results for input(s): CHOL, HDL, LDLCALC, TRIG, CHOLHDL,  LDLDIRECT in the last 72 hours. Thyroid function studies No results for input(s): TSH, T4TOTAL, T3FREE, THYROIDAB in the last 72 hours.  Invalid input(s): FREET3 Anemia work up No results for input(s): VITAMINB12, FOLATE, FERRITIN, TIBC, IRON, RETICCTPCT in the last 72 hours. Urinalysis    Component Value Date/Time   COLORURINE YELLOW 01/21/2009 1719   APPEARANCEUR CLOUDY (A) 01/21/2009 1719   LABSPEC 1.021 01/21/2009 1719   PHURINE 5.0 01/21/2009 1719   GLUCOSEU NEGATIVE 01/21/2009 1719   HGBUR TRACE (A) 01/21/2009 1719   BILIRUBINUR MODERATE (A) 01/21/2009 1719   KETONESUR 15 (A) 01/21/2009 1719   PROTEINUR 30 (A) 01/21/2009 1719   UROBILINOGEN 1.0 01/21/2009 1719   NITRITE POSITIVE (A) 01/21/2009 1719   LEUKOCYTESUR MODERATE (A) 01/21/2009 1719   Sepsis Labs Invalid input(s): PROCALCITONIN,  WBC,  LACTICIDVEN Microbiology Recent Results (from the past 240 hour(s))  Surgical pcr screen     Status: Abnormal   Collection Time: 10/19/17  4:01 PM  Result Value Ref Range Status   MRSA, PCR NEGATIVE NEGATIVE Final   Staphylococcus aureus POSITIVE (A) NEGATIVE Final    Comment: (NOTE) The Xpert SA Assay (FDA approved for NASAL specimens in patients 56 years of age and older), is one component of a comprehensive surveillance program. It is not intended to diagnose infection nor to guide or monitor treatment. Performed at [facility], [place] [address] [place], [place] [address]     Please note: You were cared for by a hospitalist during your  hospital stay. Once you are discharged, your primary care physician will handle any further medical issues. Please note that NO REFILLS for any discharge medications will be authorized once you are discharged, as it is imperative that you return to your primary care physician (or establish a relationship with a primary care physician if you do not have one) for your post hospital discharge needs so that they can reassess your need for medications and monitor your lab values.    Time coordinating discharge: 40 minutes  SIGNED:   Shelly Coss, MD  [facility] 10/23/2017, 9:55 AM Pager [phone]  If 7PM-7AM, please contact night-coverage [email or web address] Password [email or web address]

## 2017-10-23 NOTE — Progress Notes (Signed)
Subjective: 3 Days Post-Op Procedure(s) (LRB): INTRAMEDULLARY (IM) NAIL INTERTROCHANTRIC FEMORAL NAIL (Right) Patient reports pain as moderate.    Objective: Vital signs in last 24 hours: Temp:  [97.5 F (36.4 C)-98.8 F (37.1 C)] 97.8 F (36.6 C) (08/28 0752) Pulse Rate:  [56-76] 73 (08/28 0752) Resp:  [16-20] 20 (08/28 0752) BP: (73-155)/(34-60) 155/50 (08/28 0752) SpO2:  [95 %-99 %] 97 % (08/28 0752)  Intake/Output from previous day: 08/27 0701 - 08/28 0700 In: 300 [P.O.:300] Out: 800  Intake/Output this shift: No intake/output data recorded.  Recent Labs    10/21/17 0405 10/22/17 0422 10/23/17 0359  HGB 10.7* 9.6* 9.0*   Recent Labs    10/22/17 0422 10/23/17 0359  WBC 6.6 6.0  RBC 3.49* 3.28*  HCT 30.1* 29.9*  PLT 103* 94*   Recent Labs    10/22/17 0422 10/23/17 0359  NA 135 134*  K 4.5 3.9  CL 96* 96*  CO2 26 27  BUN 52* 21  CREATININE 6.26* 3.82*  GLUCOSE 122* 106*  CALCIUM 7.9* 7.6*   Recent Labs    10/22/17 0422 10/23/17 0359  INR 2.24 2.08    Sensation intact distally Intact pulses distally Dorsiflexion/Plantar flexion intact Incision: dressing C/D/I No cellulitis present    Assessment/Plan: 3 Days Post-Op Procedure(s) (LRB): INTRAMEDULLARY (IM) NAIL INTERTROCHANTRIC FEMORAL NAIL (Right) Up with therapy Discharge to SNF  Can go from ortho standpoint    Vanessa Webb 10/23/2017, 8:07 AM

## 2017-10-23 NOTE — Clinical Social Work Note (Signed)
Clinical Social Work Assessment  Patient Details  Name: Vanessa Webb MRN: [id number] Date of Birth: 06-26-1945  Date of referral:  10/22/17               Reason for consult:  Facility Placement                Permission sought to share information with:  Family Supports Permission granted to share information::  Yes, Verbal Permission Granted  Name::     Vanessa Webb and Vanessa Webb::     Relationship::  Husband and Daughter  Sport and exercise psychologist Information:  [phone] - spouse  Housing/Transportation Living arrangements for the past 2 months:  [place] of Information:  Patient, Adult Children Patient Interpreter Needed:  None Criminal Activity/Legal Involvement Pertinent to Current Situation/Hospitalization:  No - Comment as needed Significant Relationships:  Adult Children, Spouse Lives with:  Spouse Do you feel safe going back to the place where you live?  Yes Need for family participation in patient care:  Yes (Comment)  Care giving concerns:  Patient and daughter agreeable to rehab before patient returns home.  Social Worker assessment / plan:  CSW talked with patient and daughter at the bedside regarding discharge disposition. Daughter Vanessa Webb was at the bedside and actively participated in the discussion and patient gave CSW permission to contact her daughter. Vanessa Webb was sitting in a chair and was alert, oriented and engaged in the conversation. SNF placement discussed and CSW informed that their preference is [facility]. CSW informed patient/daughter that facility would be contacted to determine if they can accept patient and clinicals will be sent out. Patient and daughter also informed that insurance authorization needed before patient can discharge and Vanessa Webb and her daughter expressed understanding.   Employment status:  Retired Research officer, political party) PT Recommendations:  [place]  / Referral to community resources:  [place]  Patient/Family's Response to care:  No concerns expressed regarding patient's care during hospitalization.  Patient/Family's Understanding of and Emotional Response to Diagnosis, Current Treatment, and Prognosis:  Patient and daughter expressed understanding of her need for rehab before returning home.  Emotional Assessment Appearance:  Appears stated age Attitude/Demeanor/Rapport:  Engaged Affect (typically observed):  Appropriate Orientation:  Oriented to Self, Oriented to Place, Oriented to  Time, Oriented to Situation Alcohol / Substance use:  Never Used Psych involvement (Current and /or in the community):  No (Comment)  Discharge Needs  Concerns to be addressed:  Discharge Planning Concerns Readmission within the last 30 days:  No Current discharge risk:  None Barriers to Discharge:  Insurance Authorization([employer] Medicare)   Vanessa Feil, LCSW 10/23/2017, 6:37 PM

## 2017-10-24 LAB — CBC
HCT: 28.5 % — ABNORMAL LOW (ref 36.0–46.0)
HEMOGLOBIN: 9.1 g/dL — AB (ref 12.0–15.0)
MCH: 27.6 pg (ref 26.0–34.0)
MCHC: 31.9 g/dL (ref 30.0–36.0)
MCV: 86.4 fL (ref 78.0–100.0)
Platelets: 96 10*3/uL — ABNORMAL LOW (ref 150–400)
RBC: 3.3 MIL/uL — AB (ref 3.87–5.11)
RDW: 14.3 % (ref 11.5–15.5)
WBC: 5.9 10*3/uL (ref 4.0–10.5)

## 2017-10-24 LAB — RENAL FUNCTION PANEL
ALBUMIN: 2.4 g/dL — AB (ref 3.5–5.0)
ANION GAP: 12 (ref 5–15)
BUN: 38 mg/dL — ABNORMAL HIGH (ref 8–23)
CALCIUM: 8 mg/dL — AB (ref 8.9–10.3)
CO2: 27 mmol/L (ref 22–32)
Chloride: 95 mmol/L — ABNORMAL LOW (ref 98–111)
Creatinine, Ser: 5.57 mg/dL — ABNORMAL HIGH (ref 0.44–1.00)
GFR calc non Af Amer: 7 mL/min — ABNORMAL LOW (ref >60)
GFR, EST AFRICAN AMERICAN: 8 mL/min — AB (ref >60)
GLUCOSE: 134 mg/dL — AB (ref 70–99)
POTASSIUM: 4 mmol/L (ref 3.5–5.1)
Phosphorus: 4.7 mg/dL — ABNORMAL HIGH (ref 2.5–4.6)
Sodium: 134 mmol/L — ABNORMAL LOW (ref 135–145)

## 2017-10-24 LAB — PROTIME-INR
INR: 2.01
Prothrombin Time: 22.6 seconds — ABNORMAL HIGH (ref 11.4–15.2)

## 2017-10-24 LAB — GLUCOSE, CAPILLARY
GLUCOSE-CAPILLARY: 79 mg/dL (ref 70–99)
Glucose-Capillary: 88 mg/dL (ref 70–99)
Glucose-Capillary: 95 mg/dL (ref 70–99)

## 2017-10-24 MED ORDER — HEPARIN SODIUM (PORCINE) 1000 UNIT/ML DIALYSIS: 40.0000 [IU]/kg | INTRAMUSCULAR | Status: DC | PRN

## 2017-10-24 MED ORDER — OXYCODONE HCL 5 MG PO TABS: 5.0000 mg | ORAL_TABLET | ORAL | 0 refills | Status: DC | PRN

## 2017-10-24 MED ORDER — WARFARIN SODIUM 2.5 MG PO TABS
2.5000 mg | ORAL_TABLET | Freq: Once | ORAL | Status: AC
  Administered 2017-10-24: 2.5 mg via ORAL
  Filled 2017-10-24: qty 1

## 2017-10-24 NOTE — Progress Notes (Signed)
Occupational Therapy Treatment Patient Details Name: Vanessa Webb MRN: [id number] DOB: [birth year] Today's Date: 10/24/2017    History of present illness Vanessa Webb is a 72 y.o. female with medical history significant of type 2 diabetes on sliding scale insulin, hypertension, hyperlipidemia, coronary artery disease status post MI with no intervention, paroxysmal atrial fibrillation on warfarin, ESRD on Tuesday/Thursday/Saturday dialysis at [facility] in [facility] who came to the hospital after a mechanical fall at dialysis per her report, with resulting right hip fracture, possible R proximal second phalanx (managing non-operatively), now s/p IM Nailing 10/20/17. She is WBAT RLE.   OT comments  Pt progressing towards acute OT session. Focus of session was functional mobility (to/from bathroom) and toilet transfer.Pt reporting difficulty urinating, nursing aware. D/c plan remains appropriate.   Follow Up Recommendations  SNF;Supervision/Assistance - 24 hour    Equipment Recommendations  3 in 1 bedside commode;Other (comment)(Consider LH A/E for ADL's)    Recommendations for Other Services      Precautions / Restrictions Precautions Precautions: Fall Restrictions Weight Bearing Restrictions: Yes RLE Weight Bearing: Weight bearing as tolerated       Mobility Bed Mobility Overal bed mobility: Needs Assistance Bed Mobility: Supine to Sit;Sit to Supine     Supine to sit: Min guard Sit to supine: Min assist   General bed mobility comments: light assist to advance RLE fully onto bed  Transfers Overall transfer level: Needs assistance Equipment used: Rolling walker (2 wheeled) Transfers: Sit to/from Stand Sit to Stand: Mod assist;Min assist         General transfer comment: min A from EOB, mod A from regular height toilet. cues for technique with rw    Balance Overall balance assessment: Needs assistance Sitting-balance support: Feet supported;Bilateral upper  extremity supported Sitting balance-Leahy Scale: Fair     Standing balance support: Bilateral upper extremity supported Standing balance-Leahy Scale: Poor                             ADL either performed or assessed with clinical judgement   ADL Overall ADL's : Needs assistance/impaired                         Toilet Transfer: Moderate assistance;Ambulation;Regular Toilet;RW;Grab bars Toilet Transfer Details (indicate cue type and reason): pt declined 3n1 frame over toilet. assist to powerup from regular height toilet         Functional mobility during ADLs: Minimal assistance;Rolling walker;Cueing for safety General ADL Comments: Pt walked to/from bathroom and completed toilet transfer. Extra time and effort. Slow, guarded movements     Vision       Perception     Praxis      Cognition Arousal/Alertness: Awake/alert Behavior During Therapy: Flat affect Overall Cognitive Status: Within Functional Limits for tasks assessed                                          Exercises     Shoulder Instructions       General Comments      Pertinent Vitals/ Pain       Pain Assessment: 0-10 Pain Score: 6  Pain Location: RLE Pain Descriptors / Indicators: Aching;Discomfort;Grimacing Pain Intervention(s): Monitored during session;Repositioned;Limited activity within patient's tolerance  Home Living  Prior Functioning/Environment              Frequency  Min 2X/week        Progress Toward Goals  OT Goals(current goals can now be found in the care plan section)  Progress towards OT goals: Progressing toward goals  Acute Rehab OT Goals Patient Stated Goal: to get better OT Goal Formulation: With patient Time For Goal Achievement: 11/04/17 Potential to Achieve Goals: Good ADL Goals Pt Will Perform Grooming: with modified independence;standing Pt Will Perform Lower  Body Dressing: with modified independence;with adaptive equipment;sit to/from stand Pt Will Transfer to Toilet: with modified independence;ambulating;bedside commode Pt Will Perform Toileting - Clothing Manipulation and hygiene: sitting/lateral leans;with modified independence;sit to/from stand Pt Will Perform Tub/Shower Transfer: Tub transfer;with supervision;tub bench;rolling walker;ambulating  Plan Discharge plan remains appropriate    Co-evaluation                 AM-PAC PT "6 Clicks" Daily Activity     Outcome Measure   Help from another person eating meals?: None Help from another person taking care of personal grooming?: A Little Help from another person toileting, which includes using toliet, bedpan, or urinal?: A Little Help from another person bathing (including washing, rinsing, drying)?: A Little Help from another person to put on and taking off regular upper body clothing?: None Help from another person to put on and taking off regular lower body clothing?: A Lot 6 Click Score: 19    End of Session Equipment Utilized During Treatment: Gait belt;Rolling walker  OT Visit Diagnosis: Pain;Muscle weakness (generalized) (M62.81);Unsteadiness on feet (R26.81) Pain - Right/Left: Right Pain - part of body: Hip   Activity Tolerance Patient tolerated treatment well   Patient Left in bed;with call bell/phone within reach;with family/visitor present   Nurse Communication Other (comment)(Pt reports difficulty urinating)        Time: 6283-1517 OT Time Calculation (min): 20 min  Charges: OT General Charges $OT Visit: 1 Visit OT Treatments $Self Care/Home Management : 8-22 mins     Hortencia Pilar 10/24/2017, 12:44 PM

## 2017-10-24 NOTE — Progress Notes (Signed)
Gave AVS to patient's family at the bedside and prescription. IV taken out by NT. Report has been given to RN at [facility] in [place]. [facility] has been called for patient. Patient is getting dressed with help from NT. Will continue to monitor.   Farley Ly RN

## 2017-10-24 NOTE — Progress Notes (Signed)
PROGRESS NOTE    Vanessa Webb  OZD:[id number] DOB: December 05, 1945 DOA: 10/19/2017 PCP: Monico Blitz, MD   Brief Narrative:  Patient is a 72 year old with past medical history relevant for chronic atrial fibrillation on warfarin, ESRD on Tuesday/Thursday/Saturday dialysis in [place] at [place], hypertension, hyperlipidemia, type 2 diabetes on insulin who presented after mechanical fall at dialysis with right hip fracturestatus post repair on 10/20/2017. Orthopedics evaluated the patient and she underwent intramedullary nailing.  PT/OT evaluated the patient and recommended to be discharged to skilled nursing facility.  She is medically stable for discharge today.  Assessment & Plan:   Principal Problem:   Closed right hip fracture, initial encounter ([place]) Active Problems:   End stage renal disease ([place])   Atherosclerosis of native arteries of the extremities with ulceration ([place])   DM (diabetes mellitus) with complications (H[facility])   Atrial fibrillation, unspecified type ([place])   HTN (hypertension)   HLD (hyperlipidemia)   S/P right hip fracture   #) Right foot pain: Reported outside hospital noted possible second proximal phalanx fracture. -Foot x-ray shows no acute fracture  #) ESRD on Tuesday/Thursday/Saturday dialysis: Patient is dialyzed at [facility] in [facility]. -Nephrology was folllowing  #)Nonobstructing coronary artery disease status post NSTEMI: Patient reports remote history with cardiac catheterization that had no need for intervention. -Continue atorvastatin 20 mg nightly - Continue metoprolol tartrate 37.5 mg twice daily  #) Hypertension/hyperlipidemia: -Continue statin -Continue beta-blocker -Continue amlodipine 2.5 mg on Monday, Wednesday, Friday, Sunday  #) Type 2 diabetes: Resume home regimen.  #) Chronic atrial fibrillation: -Restarting home warfarin  DVT prophylaxis: Warfarin Code Status: Full Family Communication: Family member  present at the bed side Disposition Plan: SNF today   Consultants: Nephrology,Orthopedics  Procedures:intramedullary nailing of  right hip   Antimicrobials: None  Subjective: Patient seen and examined at the bed side .No active issues.Stable for discharge today.  Objective: Vitals:   10/23/17 1608 10/23/17 2037 10/24/17 0632 10/24/17 0740  BP: (!) 140/45 (!) 169/53 (!) 159/53 (!) 149/54  Pulse: 72 74 68 68  Resp: 16 17 18 20   Temp: 98.2 F (36.8 C) 98.3 F (36.8 C) 98 F (36.7 C) 97.9 F (36.6 C)  TempSrc: Oral  Oral Oral  SpO2: 100% 100% 95% 96%  Weight:      Height:        Intake/Output Summary (Last 24 hours) at 10/24/2017 1034 Last data filed at 10/24/2017 0900 Gross per 24 hour  Intake 240 ml  Output 0 ml  Net 240 ml   Filed Weights   10/19/17 1934 10/19/17 2324 10/22/17 0650  Weight: 51.3 kg 51.3 kg 52.3 kg    Examination:  General exam: Appears calm and comfortable ,Not in distress,average built HEENT:PERRL,Oral mucosa moist, Ear/Nose normal on gross exam Respiratory system: Bilateral equal air entry, normal vesicular breath sounds, no wheezes or crackles  Cardiovascular system: S1 & S2 heard, RRR. No JVD, murmurs, rubs, gallops or clicks. No pedal edema. Gastrointestinal system: Abdomen is nondistended, soft and nontender. No organomegaly or masses felt. Normal bowel sounds heard. Central nervous system: Alert and oriented. No focal neurological deficits. Extremities: No edema, no clubbing ,no cyanosis, distal peripheral pulses palpable. clean surgical wound on the right hip Skin: No rashes, lesions or ulcers,no icterus ,no pallor MSK: Normal muscle bulk,tone ,power Psychiatry: Judgement and insight appear normal. Mood & affect appropriate.     Data Reviewed: I have personally reviewed following labs and imaging studies  CBC: Recent Labs  Lab  10/19/17 1704 10/20/17 0505 10/21/17 0405 10/22/17 0422 10/23/17 0359  WBC 8.0 6.2 7.8 6.6 6.0  HGB  12.1 10.8* 10.7* 9.6* 9.0*  HCT 38.8 34.6* 34.3* 30.1* 29.9*  MCV 87.2 87.4 88.4 86.2 91.2  PLT 98* 83* 82* 103* 94*   Basic Metabolic Panel: Recent Labs  Lab 10/19/17 1704 10/20/17 0505 10/21/17 0405 10/22/17 0422 10/23/17 0359  NA 138 139 137 135 134*  K 5.5* 4.5 4.8 4.5 3.9  CL 99 99 100 96* 96*  CO2 27 29 22 26 27   GLUCOSE 132* 98 169* 122* 106*  BUN 28* 12 31* 52* 21  CREATININE 6.06* 3.54* 5.24* 6.26* 3.82*  CALCIUM 8.3* 8.2* 7.7* 7.9* 7.6*  MG 1.9  --   --   --   --   PHOS  --   --  7.9*  --  3.9   GFR: Estimated Creatinine Clearance: 10.8 mL/min (A) (by C-G formula based on SCr of 3.82 mg/dL (H)). Liver Function Tests: Recent Labs  Lab 10/19/17 1704 10/21/17 0405 10/23/17 0359  AST 127*  --   --   ALT 75*  --   --   ALKPHOS 91  --   --   BILITOT 1.2  --   --   PROT 7.1  --   --   ALBUMIN 3.2* 2.7* 2.5*   No results for input(s): LIPASE, AMYLASE in the last 168 hours. No results for input(s): AMMONIA in the last 168 hours. Coagulation Profile: Recent Labs  Lab 10/20/17 0505 10/21/17 0405 10/22/17 0422 10/23/17 0359 10/24/17 0417  INR 1.50 1.55 2.24 2.08 2.01   Cardiac Enzymes: No results for input(s): CKTOTAL, CKMB, CKMBINDEX, TROPONINI in the last 168 hours. BNP (last 3 results) No results for input(s): PROBNP in the last 8760 hours. HbA1C: No results for input(s): HGBA1C in the last 72 hours. CBG: Recent Labs  Lab 10/23/17 0754 10/23/17 1143 10/23/17 1649 10/23/17 2036 10/24/17 0738  GLUCAP 100* 104* 97 108* 88   Lipid Profile: No results for input(s): CHOL, HDL, LDLCALC, TRIG, CHOLHDL, LDLDIRECT in the last 72 hours. Thyroid Function Tests: No results for input(s): TSH, T4TOTAL, FREET4, T3FREE, THYROIDAB in the last 72 hours. Anemia Panel: No results for input(s): VITAMINB12, FOLATE, FERRITIN, TIBC, IRON, RETICCTPCT in the last 72 hours. Sepsis Labs: No results for input(s): PROCALCITON, LATICACIDVEN in the last 168 hours.  Recent  Results (from the past 240 hour(s))  Surgical pcr screen     Status: Abnormal   Collection Time: 10/19/17  4:01 PM  Result Value Ref Range Status   MRSA, PCR NEGATIVE NEGATIVE Final   Staphylococcus aureus POSITIVE (A) NEGATIVE Final    Comment: (NOTE) The Xpert SA Assay (FDA approved for NASAL specimens in patients 20 years of age and older), is one component of a comprehensive surveillance program. It is not intended to diagnose infection nor to guide or monitor treatment. Performed at [facility], [place] [address], [place], [place] [address]          Radiology Studies: No results found.      Scheduled Meds: . amLODipine  2.5 mg Oral Q M,W,F,Su-1800  . atorvastatin  20 mg Oral QHS  . calcium acetate  1,334 mg Oral TID WC  . Chlorhexidine Gluconate Cloth  6 each Topical Q0600  . Chlorhexidine Gluconate Cloth  6 each Topical Daily  . darbepoetin (ARANESP) injection - DIALYSIS  40 mcg Intravenous Q Tue-HD  . docusate sodium  100 mg Oral BID  .  insulin aspart  0-5 Units Subcutaneous QHS  . insulin aspart  0-9 Units Subcutaneous TID WC  . metoprolol tartrate  25 mg Oral BID  . mupirocin ointment  1 application Nasal BID  . sodium chloride flush  3 mL Intravenous Q12H  . warfarin  2.5 mg Oral ONCE-1800  . Warfarin - Pharmacist Dosing Inpatient   Does not apply q1800   Continuous Infusions: . sodium chloride    . sodium chloride       LOS: 5 days    Time spent: 25 mins.More than 50% of that time was spent in counseling and/or coordination of care.      Shelly Coss, MD [facility] Pager [phone]  If 7PM-7AM, please contact night-coverage [email or web address] Password [facility] 10/24/2017, 10:34 AM

## 2017-10-24 NOTE — Progress Notes (Signed)
Patient discharged to [facility] by [facility]. Belongings taken by family. Valaria Good, Wonda Cheng, RN

## 2017-10-24 NOTE — Clinical Social Work Placement (Signed)
   CLINICAL SOCIAL WORK PLACEMENT  NOTE  Date:  10/24/2017  Patient Details  Name: Vanessa Webb MRN: [id number] Date of Birth: 11-Mar-1945  Clinical Social Work is seeking post-discharge placement for this patient at the [place] level of care (*CSW will initial, date and re-position this form in  chart as items are completed):  Yes   Patient/family provided with [place] Work Department's list of facilities offering this level of care within the geographic area requested by the patient (or if unable, by the patient's family).  Yes   Patient/family informed of their freedom to choose among providers that offer the needed level of care, that participate in Medicare, Medicaid or managed care program needed by the patient, have an available bed and are willing to accept the patient.  Yes   Patient/family informed of [facility]'s ownership interest in [facility] and [facility], as well as of the fact that they are under no obligation to receive care at these facilities.  PASRR submitted to EDS on       PASRR number received on       Existing PASRR number confirmed on       FL2 transmitted to all facilities in geographic area requested by pt/family on       FL2 transmitted to all facilities within larger geographic area on       Patient informed that his/her managed care company has contracts with or will negotiate with certain facilities, including the following:        Yes   Patient/family informed of bed offers received.  Patient chooses bed at [facility])     Physician recommends and patient chooses bed at      Patient to be transferred to [facility] SNF) on 10/24/17.  Patient to be transferred to facility by [facility]     Patient family notified on 10/24/17 of transfer.  Name of family member notified:  spouse     PHYSICIAN       Additional Comment:    _______________________________________________ Weston Anna, LCSW 10/24/2017, 2:14 PM

## 2017-10-24 NOTE — Progress Notes (Signed)
ANTICOAGULATION CONSULT NOTE - Initial Consult  Pharmacy Consult for Warfarin Indication: atrial fibrillation  Allergies  Allergen Reactions  . Sulfa Antibiotics Nausea Only    nausea   Patient Measurements: Height: 5\' 4"  (162.6 cm) Weight: 115 lb 4.8 oz (52.3 kg) IBW/kg (Calculated) : 54.7 Heparin Dosing Weight: 51 kg  Vital Signs: Temp: 97.9 F (36.6 C) (08/29 0740) Temp Source: Oral (08/29 0740) BP: 149/54 (08/29 0740) Pulse Rate: 68 (08/29 0740)  Labs: Recent Labs    10/22/17 0422 10/23/17 0359 10/24/17 0417  HGB 9.6* 9.0*  --   HCT 30.1* 29.9*  --   PLT 103* 94*  --   LABPROT 24.6* 23.2* 22.6*  INR 2.24 2.08 2.01  CREATININE 6.26* 3.82*  --    Estimated Creatinine Clearance: 10.8 mL/min (A) (by C-G formula based on SCr of 3.82 mg/dL (H)).  Assessment: 82 yoF presenting s/p mechanical fall with R hip fracture, PMH afib on warfarin PTA. Pharmacy consulted to resume warfarin dosing. Hgb 9, pltc 94. No bleeding noted.  INR 2.01 (therapeutic). Due to fast increase after only 2 doses of 5mg , will give smaller doses. No bleeding noted.   PTA warfarin dose: 2.5mg  TTSa, 5mg  MWF (last dose 8/23 PTA, admit INR 1.5)  Goal of Therapy:  INR 2-3 Monitor platelets by anticoagulation protocol: Yes   Plan:  Warfarin 2.5 mg PO x1 Daily INR  Monitor s/sx of bleeding  Alex Leahy A. Levada Dy, PharmD, [place] Pager: [phone] Please utilize Amion for appropriate phone number to reach the unit pharmacist ([place])   10/24/2017,7:53 AM

## 2017-10-24 NOTE — Progress Notes (Addendum)
Patient ID: Vanessa Webb, female   DOB: 1945/03/21, 72 y.o.   MRN: [id number]  [facility] Progress Note   Assessment/ Plan:   1.  Closed right hip fracture: POD #4 s/p ORIF with intramedullary nail/hip screw placement.  Plans noted for possible DC to skilled nursing facility later today based on husband's report. 2. ESRD: We will schedule for hemodialysis today to continue her usual TTS schedule-anticipated DC to SNF thereafter. 3. Anemia: Restarted on ESA after review of decreasing hemoglobin/hematocrit postop. 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 5. Nutrition: Continue nutritional supplementation to help improve albumin levels. 6. Hypertension: Blood pressures currently acceptable on amlodipine-monitor with ultrafiltration/hemodialysis.  Subjective:   Reports mild right hip discomfort with movements and anticipates discharge to skilled nursing facility/rehab in [place], [place] today.   Objective:   BP (!) 149/54 (BP Location: Left Arm)   Pulse 68   Temp 97.9 F (36.6 C) (Oral)   Resp 20   Ht 5\' 4"  (1.626 m)   Wt 52.3 kg   SpO2 96%   BMI 19.79 kg/m   Physical Exam: Gen: Resting comfortably in bed, husband at bedside CVS: Pulse regular rhythm, normal rate, S1 and S2 normal Resp: Clear to auscultation, no rales/rhonchi Abd: Soft, flat, nontender Ext: No lower extremity edema.  Right upper arm aVF-pulsatile  Labs: BMET Recent Labs  Lab 10/19/17 1704 10/20/17 0505 10/21/17 0405 10/22/17 0422 10/23/17 0359  NA 138 139 137 135 134*  K 5.5* 4.5 4.8 4.5 3.9  CL 99 99 100 96* 96*  CO2 27 29 22 26 27   GLUCOSE 132* 98 169* 122* 106*  BUN 28* 12 31* 52* 21  CREATININE 6.06* 3.54* 5.24* 6.26* 3.82*  CALCIUM 8.3* 8.2* 7.7* 7.9* 7.6*  PHOS  --   --  7.9*  --  3.9   CBC Recent Labs  Lab 10/20/17 0505 10/21/17 0405 10/22/17 0422 10/23/17 0359  WBC 6.2 7.8 6.6 6.0  HGB 10.8* 10.7* 9.6* 9.0*  HCT 34.6* 34.3* 30.1* 29.9*  MCV 87.4  88.4 86.2 91.2  PLT 83* 82* 103* 94*   Medications:    . amLODipine  2.5 mg Oral Q M,W,F,Su-1800  . atorvastatin  20 mg Oral QHS  . calcium acetate  1,334 mg Oral TID WC  . Chlorhexidine Gluconate Cloth  6 each Topical Q0600  . Chlorhexidine Gluconate Cloth  6 each Topical Daily  . darbepoetin (ARANESP) injection - DIALYSIS  40 mcg Intravenous Q Tue-HD  . docusate sodium  100 mg Oral BID  . insulin aspart  0-5 Units Subcutaneous QHS  . insulin aspart  0-9 Units Subcutaneous TID WC  . metoprolol tartrate  25 mg Oral BID  . mupirocin ointment  1 application Nasal BID  . sodium chloride flush  3 mL Intravenous Q12H  . warfarin  2.5 mg Oral ONCE-1800  . Warfarin - Pharmacist Dosing Inpatient   Does not apply [id number]   Elmarie Shiley, MD 10/24/2017, 11:46 AM

## 2017-10-24 NOTE — Plan of Care (Signed)
  Problem: Education: Goal: Knowledge of General Education information will improve Description Including pain rating scale, medication(s)/side effects and non-pharmacologic comfort measures Outcome: Progressing Note:  POC reviewed with pt. and daughter.

## 2017-10-24 NOTE — Progress Notes (Signed)
[facility] this AM. CSW spoke with Mardene Celeste with admissions and facility is prepared to accept patient this even after normal business hours. Patient will transport to facility after dialysis this afternoon and will be transported via [facility]([phone]).   Please call report to [phone] once patient is back from dialysis and ready to discharge from hospital- in the event patient does not discharge please notify facility. CSW has attempted to contact spouse, Mallie Mussel, to notify him of late discharge.   Kingsley Spittle, [place]  [phone]

## 2017-10-25 ENCOUNTER — Telehealth (INDEPENDENT_AMBULATORY_CARE_PROVIDER_SITE_OTHER): Payer: Self-pay

## 2017-10-25 NOTE — Telephone Encounter (Signed)
Facility called asking her weight bearing status and if you want the dressing removed and replaced in the weeks before post op appt? Theadora Rama [phone]

## 2017-10-25 NOTE — Telephone Encounter (Signed)
She is full weight bearing as tolerated.  They can leave that dressing on until her follow-up, but can change it if it becomes saturated.

## 2017-10-25 NOTE — Telephone Encounter (Signed)
Brandy aware of the below message from Dr. Ninfa [place]

## 2017-10-30 ENCOUNTER — Telehealth (INDEPENDENT_AMBULATORY_CARE_PROVIDER_SITE_OTHER): Payer: Self-pay | Admitting: Orthopaedic Surgery

## 2017-10-30 NOTE — Telephone Encounter (Signed)
That will be fine to schedule at a later date.

## 2017-10-30 NOTE — Telephone Encounter (Signed)
Please advise is this ok?

## 2017-10-30 NOTE — Telephone Encounter (Signed)
Patient husband called, wanted to ask if it was ok to push out patient post op visit? Due to the length of travel so soon after surgery. Post op visit is scheduled for the 10th of September.

## 2017-11-05 ENCOUNTER — Inpatient Hospital Stay (INDEPENDENT_AMBULATORY_CARE_PROVIDER_SITE_OTHER): Payer: Medicare Other | Admitting: Orthopaedic Surgery

## 2017-11-06 ENCOUNTER — Encounter (INDEPENDENT_AMBULATORY_CARE_PROVIDER_SITE_OTHER): Payer: Self-pay | Admitting: Physician Assistant

## 2017-11-06 ENCOUNTER — Ambulatory Visit (INDEPENDENT_AMBULATORY_CARE_PROVIDER_SITE_OTHER): Payer: No Typology Code available for payment source | Admitting: Physician Assistant

## 2017-11-06 ENCOUNTER — Ambulatory Visit (INDEPENDENT_AMBULATORY_CARE_PROVIDER_SITE_OTHER): Payer: No Typology Code available for payment source

## 2017-11-06 DIAGNOSIS — M25551 Pain in right hip: Secondary | ICD-10-CM

## 2017-11-06 NOTE — Progress Notes (Signed)
Office Visit Note   Patient: Vanessa Webb           Date of Birth: [birth year]           MRN: [id number] Visit Date: 11/06/2017              Requested by: Monico Blitz, MD [address] [place],  [id number] PCP: Monico Blitz, MD   Assessment & Plan: Visit Diagnoses:  1. Pain in right hip     Plan: She will discontinue the postop shoe on the right foot.  She is weightbearing as tolerated on the right foot in the right lower extremity.  She is able to get a shower and get incisions wet.  She will let the Steri-Strips just fall off with time.  Continue physical therapy for strengthening range of motion right lower extremity.  Follow-up with [place] in 1 month 2 views of the right hip at that time.  Follow-Up Instructions: Return in about 4 weeks (around 12/04/2017) for Radiographs.   Orders:  Orders Placed This Encounter  Procedures  . XR HIP UNILAT W OR W/O PELVIS 2-3 VIEWS RIGHT   No orders of the defined types were placed in this encounter.     Procedures: No procedures performed   Clinical Data: No additional findings.   Subjective: Chief Complaint  Patient presents with  . Right Foot - Pain  . Right Hip - Pain, Follow-up    HPI Vanessa Webb returns today 17 days status post IM nailing intertrochanteric femur fracture.  She is in a skilled nursing facility and is working with physical therapy.  She feels she is making good progress.  She has no pain in the right hip.  She still has pain in the right foot with ambulation.  She denies any fevers chills shortness of breath chest pain.  She is on chronic Coumadin due to A. fib. Review of Systems Please see HPI  Objective: Vital Signs: There were no vitals taken for this visit.  Physical Exam  Constitutional: She is oriented to person, place, and time. She appears well-developed and well-nourished. No distress.  Pulmonary/Chest: Effort normal.  Neurological: She is alert and oriented to person, place, and time.  Skin: She  is not diaphoretic.    Ortho Exam Right hip surgical incisions well approximated staples no signs of infection.  Right calf supple nontender.  Dorsiflexion plantarflexion ankle intact.  Sensation grossly intact throughout the right foot.  Right foot nontender throughout.  She has slight edema of the right lower leg with no pitting edema. Specialty Comments:  No specialty comments available.  Imaging: Xr Hip Unilat W Or W/o Pelvis 2-3 Views Right  Result Date: 11/06/2017 Right hip 2 views: Status post IM nailing intertrochanteric fracture with overall excellent position alignment.  No hardware failure.  No other fractures bony abnormalities identified.    PMFS History: Patient Active Problem List   Diagnosis Date Noted  . Closed right hip fracture, initial encounter ([place]) 10/19/2017  . HTN (hypertension) 10/19/2017  . HLD (hyperlipidemia) 10/19/2017  . S/P right hip fracture 10/19/2017  . Atrial fibrillation, unspecified type ([place]) 04/13/2016  . Atherosclerosis of native arteries of the extremities with ulceration ([place]) 03/18/2015  . DM (diabetes mellitus) with complications ([place]) 63/02/6008  . End stage renal disease ([place]) 08/26/2012   Past Medical History:  Diagnosis Date  . Anemia    takes Folic Acid;pt states she gets an injection every 2wks   . Closed right hip fracture, initial encounter (  [place]) 10/19/2017  . Depression    but doesn't take any meds for it  . Diabetes mellitus    takes Lantus and Novolog 70/30;fasting blood sugar 200;Type 2  . GERD (gastroesophageal reflux disease)    takes Omeprazole daily  . GI bleeding   . History of bladder infections    last one a month ago;saw Dr.Wrenn and cysto was done in office;was on antibiotics and completed 2wks ago  . History of blood transfusion    no blood transfusion  . HLD (hyperlipidemia) 10/19/2017  . HTN (hypertension) 10/19/2017  . Hyperlipidemia    takes Zocor daily  . Hypertension    takes Coreg daily  .  Myocardial infarction ([place]) 2010  . Pancreatitis   . Peripheral neuropathy   . UTI (lower urinary tract infection)     Family History  Problem Relation Age of Onset  . Diabetes Mother   . Cancer Father   . Diabetes Daughter   . Heart attack Daughter   . Pneumonia Daughter   . Anesthesia problems Neg Hx   . Hypotension Neg Hx   . Malignant hyperthermia Neg Hx   . Pseudochol deficiency Neg Hx     Past Surgical History:  Procedure Laterality Date  . A/V FISTULAGRAM Right 12/03/2016   Procedure: A/V Fistulagram - Right Arm;  Surgeon: Angelia Mould, MD;  Location: [facility];  Service: Cardiovascular;  Laterality: Right;  . ABDOMINAL HYSTERECTOMY  1976  . APPENDECTOMY    . BASCILIC VEIN TRANSPOSITION Right 09/11/2012   Procedure: BASCILIC VEIN TRANSPOSITION- RIGHT ARM;  Surgeon: Mal Misty, MD;  Location: [facility];  Service: Vascular;  Laterality: Right;  . bilateral laser surgery    . CARDIAC CATHETERIZATION  2010  . CATARACT EXTRACTION W/PHACO  03/01/2011   Procedure: CATARACT EXTRACTION PHACO AND INTRAOCULAR LENS PLACEMENT (IOC);  Surgeon: Tonny Branch;  Location: [facility];  Service: Ophthalmology;  Laterality: Right;  CDE: 15.83  . CHOLECYSTECTOMY  2010  . DILATION AND CURETTAGE OF UTERUS  1965  . ESOPHAGOGASTRODUODENOSCOPY    . hemorrhage to bilateral eye    . INTRAMEDULLARY (IM) NAIL INTERTROCHANTERIC Right 10/20/2017   Procedure: INTRAMEDULLARY (IM) NAIL INTERTROCHANTRIC FEMORAL NAIL;  Surgeon: Mcarthur Rossetti, MD;  Location: [facility];  Service: Orthopedics;  Laterality: Right;  . PERIPHERAL VASCULAR BALLOON ANGIOPLASTY Right 12/03/2016   Procedure: PERIPHERAL VASCULAR BALLOON ANGIOPLASTY;  Surgeon: Angelia Mould, MD;  Location: [facility];  Service: Cardiovascular;  Laterality: Right;  arm fistula  . ruptured ulcer in stomach  2010   had a feeding tube over a month  . SHUNTOGRAM N/A 12/02/2012   Procedure: Fistulagram;  Surgeon: Serafina Mitchell,  MD;  Location: [facility];  Service: Cardiovascular;  Laterality: N/A;   Social History   Occupational History  . Not on file  Tobacco Use  . Smoking status: Never Smoker  . Smokeless tobacco: Never Used  Substance and Sexual Activity  . Alcohol use: No  . Drug use: No  . Sexual activity: Yes    Birth control/protection: Surgical

## 2017-11-12 ENCOUNTER — Inpatient Hospital Stay (INDEPENDENT_AMBULATORY_CARE_PROVIDER_SITE_OTHER): Payer: Medicare Other | Admitting: Orthopaedic Surgery

## 2017-11-19 ENCOUNTER — Ambulatory Visit: Payer: Medicare Other | Admitting: *Deleted

## 2017-11-19 DIAGNOSIS — I4891 Unspecified atrial fibrillation: Secondary | ICD-10-CM

## 2017-11-19 DIAGNOSIS — Z5181 Encounter for therapeutic drug level monitoring: Secondary | ICD-10-CM

## 2017-11-19 LAB — POCT INR: INR: 4.1 — AB (ref 2.0–3.0)

## 2017-11-19 NOTE — Patient Instructions (Signed)
Hold coumadin tonight then decrease dose to 1 tablet daily except 2 tablets on Mondays and Fridays. Recheck 2 weeks

## 2017-12-03 ENCOUNTER — Ambulatory Visit: Payer: Medicare Other | Admitting: *Deleted

## 2017-12-03 DIAGNOSIS — Z5181 Encounter for therapeutic drug level monitoring: Secondary | ICD-10-CM

## 2017-12-03 DIAGNOSIS — I4891 Unspecified atrial fibrillation: Secondary | ICD-10-CM | POA: Diagnosis not present

## 2017-12-03 LAB — POCT INR: INR: 8 — AB (ref 2.0–3.0)

## 2017-12-03 NOTE — Patient Instructions (Signed)
Hold coumadin tonight and tomorrow night and recheck INR on Thursday. Recommended pt go to lab for STAT PT/INR but she refused.  "Has been stuck too many times" Bleeding and fall precautions discussed with pt/spouse and they verbalized understanding.  Pt to go to ED if any bleeding or falls occur.

## 2017-12-04 ENCOUNTER — Encounter (INDEPENDENT_AMBULATORY_CARE_PROVIDER_SITE_OTHER): Payer: Self-pay | Admitting: Orthopaedic Surgery

## 2017-12-04 ENCOUNTER — Ambulatory Visit (INDEPENDENT_AMBULATORY_CARE_PROVIDER_SITE_OTHER): Payer: Medicare Other | Admitting: Orthopaedic Surgery

## 2017-12-04 ENCOUNTER — Ambulatory Visit (INDEPENDENT_AMBULATORY_CARE_PROVIDER_SITE_OTHER): Payer: Medicare Other

## 2017-12-04 DIAGNOSIS — Z8781 Personal history of (healed) traumatic fracture: Secondary | ICD-10-CM | POA: Diagnosis not present

## 2017-12-04 NOTE — Progress Notes (Signed)
The patient is now 6 weeks status post open reduction and fixation of a displaced intertrochanteric hip fracture on the right side.  She is doing well overall.  She is still using a walker because she is uncomfortable transition to a cane.  Overall though she says her pain is minimal.  On exam I can put her right hip to internal extra rotation with no significant pain at all.  She still has pain over the dorsal lateral aspect of her foot and ankle and x-rays of this area in the past have been negative.  Her pain is only minimal in this area.  X-rays of the hip on the right side show the fractures and is completely healed.  He can barely see it at this point.  The hardware is intact.  At this point she will continue to increase her activities as much as comfort will allow.  We will see her back for final visit in 4 weeks with a final AP and lateral of her right hip.  All question concerns were answered and addressed.

## 2017-12-05 ENCOUNTER — Ambulatory Visit: Payer: Medicare Other | Admitting: *Deleted

## 2017-12-05 DIAGNOSIS — Z5181 Encounter for therapeutic drug level monitoring: Secondary | ICD-10-CM

## 2017-12-05 DIAGNOSIS — I4891 Unspecified atrial fibrillation: Secondary | ICD-10-CM

## 2017-12-05 LAB — POCT INR: INR: 4.2 — AB (ref 2.0–3.0)

## 2017-12-05 NOTE — Patient Instructions (Signed)
Hold coumadin tonight then start 1 tablet daily except 1/2 tablet on Saturdays Recheck in 2 weeks

## 2017-12-16 ENCOUNTER — Other Ambulatory Visit: Payer: Self-pay | Admitting: *Deleted

## 2017-12-16 MED ORDER — WARFARIN SODIUM 2.5 MG PO TABS: ORAL_TABLET | ORAL | 6 refills | Status: DC

## 2017-12-16 NOTE — Telephone Encounter (Signed)
COUMADIN REFILL SENT TO [facility]

## 2017-12-19 ENCOUNTER — Ambulatory Visit: Payer: Medicare Other | Admitting: *Deleted

## 2017-12-19 DIAGNOSIS — I4891 Unspecified atrial fibrillation: Secondary | ICD-10-CM

## 2017-12-19 DIAGNOSIS — Z5181 Encounter for therapeutic drug level monitoring: Secondary | ICD-10-CM

## 2017-12-19 LAB — POCT INR: INR: 2.1 (ref 2.0–3.0)

## 2017-12-19 NOTE — Patient Instructions (Signed)
Increase coumadin to 1 tablet daily Recheck in 3 weeks

## 2018-01-01 ENCOUNTER — Ambulatory Visit (INDEPENDENT_AMBULATORY_CARE_PROVIDER_SITE_OTHER): Payer: Medicare Other | Admitting: Orthopaedic Surgery

## 2018-01-01 ENCOUNTER — Ambulatory Visit (INDEPENDENT_AMBULATORY_CARE_PROVIDER_SITE_OTHER): Payer: Medicare Other

## 2018-01-01 ENCOUNTER — Encounter (INDEPENDENT_AMBULATORY_CARE_PROVIDER_SITE_OTHER): Payer: Self-pay | Admitting: Orthopaedic Surgery

## 2018-01-01 DIAGNOSIS — S72001A Fracture of unspecified part of neck of right femur, initial encounter for closed fracture: Secondary | ICD-10-CM

## 2018-01-01 DIAGNOSIS — Z8781 Personal history of (healed) traumatic fracture: Secondary | ICD-10-CM

## 2018-01-01 NOTE — Progress Notes (Signed)
The patient is now 49 days status post fixation of a right hip intertrochanteric fracture.  She is 72 years old.  Her husband is with her.  She says she is doing well and is now gotten rid of her walker.  On exam I can move both right and left hips easily with no issues at all and with no pain.  X-rays of the right hip show a healed intertrochanteric fracture with intact hardware.  Her husband did show me blistering this on her ankle more of the anterior medial shin area.  She does have pitting edema but no evidence of infection.  I think this is circulation related.  I did recommend compressive garments and he says that this is been recommended before.  As far as her hip goes to continue increase her activities as comfort allows and as tolerated.  Follow-up will be as needed.

## 2018-01-09 ENCOUNTER — Ambulatory Visit: Payer: Medicare Other | Admitting: *Deleted

## 2018-01-09 DIAGNOSIS — I4891 Unspecified atrial fibrillation: Secondary | ICD-10-CM

## 2018-01-09 DIAGNOSIS — Z5181 Encounter for therapeutic drug level monitoring: Secondary | ICD-10-CM | POA: Diagnosis not present

## 2018-01-09 LAB — POCT INR
INR: 2.5 (ref 2.0–3.0)
INR: 1.7 — AB (ref 2.0–3.0)

## 2018-01-09 NOTE — Patient Instructions (Signed)
Take coumadin 1 1/2 tablets tonight then increase dose to 1 tablet daily except 2 tablets on Fridays Recheck in 3 weeks

## 2018-01-31 ENCOUNTER — Ambulatory Visit: Payer: Medicare Other | Admitting: *Deleted

## 2018-01-31 DIAGNOSIS — I4891 Unspecified atrial fibrillation: Secondary | ICD-10-CM | POA: Diagnosis not present

## 2018-01-31 DIAGNOSIS — Z5181 Encounter for therapeutic drug level monitoring: Secondary | ICD-10-CM | POA: Diagnosis not present

## 2018-01-31 LAB — POCT INR: INR: 1.9 — AB (ref 2.0–3.0)

## 2018-01-31 NOTE — Patient Instructions (Signed)
Increase coumadin to 1 tablet daily except 2 tablets on Tuesdays and Fridays Recheck in 3 weeks

## 2018-02-18 ENCOUNTER — Ambulatory Visit: Payer: Medicare Other | Admitting: *Deleted

## 2018-02-18 DIAGNOSIS — I4891 Unspecified atrial fibrillation: Secondary | ICD-10-CM | POA: Diagnosis not present

## 2018-02-18 DIAGNOSIS — Z5181 Encounter for therapeutic drug level monitoring: Secondary | ICD-10-CM | POA: Diagnosis not present

## 2018-02-18 LAB — POCT INR: INR: 1.8 — AB (ref 2.0–3.0)

## 2018-02-18 NOTE — Patient Instructions (Signed)
Increase coumadin to 1 tablet daily except 2 tablets on Tuesdays, Thursdays and Saturdays Recheck in 3 weeks

## 2018-02-24 ENCOUNTER — Other Ambulatory Visit: Payer: Self-pay | Admitting: Cardiology

## 2018-03-05 ENCOUNTER — Telehealth: Payer: Self-pay | Admitting: Cardiology

## 2018-03-05 NOTE — Telephone Encounter (Signed)
LMOM to discuss when scheduled

## 2018-03-05 NOTE — Telephone Encounter (Signed)
Patient was told by dentist to get INR checked so that she can have teeth extracted

## 2018-03-06 ENCOUNTER — Ambulatory Visit: Payer: Medicare Other | Admitting: Pharmacist

## 2018-03-06 ENCOUNTER — Telehealth: Payer: Self-pay | Admitting: *Deleted

## 2018-03-06 DIAGNOSIS — I4891 Unspecified atrial fibrillation: Secondary | ICD-10-CM

## 2018-03-06 DIAGNOSIS — Z5181 Encounter for therapeutic drug level monitoring: Secondary | ICD-10-CM | POA: Diagnosis not present

## 2018-03-06 LAB — POCT INR: INR: 2.5 (ref 2.0–3.0)

## 2018-03-06 NOTE — Telephone Encounter (Signed)
Vanessa Webb called stating that he needs for his wife to have her coumdin checked today due to an upcoming dental appointment  Please call [phone] ask for [facility].

## 2018-03-06 NOTE — Telephone Encounter (Signed)
Spoke with Vanessa Webb and scheduled pt for INR check this afternoon. He states understanding. INR will need to be therapeutic for dental extractions pending tomorrow.

## 2018-03-06 NOTE — Patient Instructions (Signed)
Description   Continue 1 tablet daily except 2 tablets on Tuesdays, Thursdays and Saturdays Recheck in 4 weeks

## 2018-03-06 NOTE — Telephone Encounter (Signed)
See phone note from 03/06/18 for details.

## 2018-03-06 NOTE — Telephone Encounter (Signed)
Pt's husband returned call, please call on cell phone [phone]

## 2018-03-27 ENCOUNTER — Telehealth: Payer: Self-pay | Admitting: Cardiology

## 2018-03-27 NOTE — Telephone Encounter (Signed)
Noted. Will cancel next Coumadin check with our office.

## 2018-03-27 NOTE — Telephone Encounter (Signed)
Patient's husband called stating that Dr. Manuella Ghazi (PCP) is going to start managing patient's coumdin checks.

## 2018-05-28 DEATH — deceased

## 2018-06-11 ENCOUNTER — Inpatient Hospital Stay (HOSPITAL_COMMUNITY)
Admission: AD | Admit: 2018-06-11 | Discharge: 2018-06-27 | DRG: 981 | Disposition: A | Discharge status: Skilled Nursing Facility | Payer: Medicare Other | Source: Other Acute Inpatient Hospital | Attending: Internal Medicine | Admitting: Internal Medicine

## 2018-06-11 ENCOUNTER — Observation Stay (HOSPITAL_COMMUNITY): Payer: Medicare Other

## 2018-06-11 ENCOUNTER — Observation Stay (HOSPITAL_BASED_OUTPATIENT_CLINIC_OR_DEPARTMENT_OTHER): Payer: Medicare Other

## 2018-06-11 ENCOUNTER — Encounter (HOSPITAL_COMMUNITY): Payer: Self-pay

## 2018-06-11 ENCOUNTER — Other Ambulatory Visit: Payer: Self-pay

## 2018-06-11 DIAGNOSIS — T476X5A Adverse effect of antidiarrheal drugs, initial encounter: Secondary | ICD-10-CM | POA: Diagnosis not present

## 2018-06-11 DIAGNOSIS — Z681 Body mass index (BMI) 19 or less, adult: Secondary | ICD-10-CM

## 2018-06-11 DIAGNOSIS — Z8249 Family history of ischemic heart disease and other diseases of the circulatory system: Secondary | ICD-10-CM

## 2018-06-11 DIAGNOSIS — I10 Essential (primary) hypertension: Secondary | ICD-10-CM | POA: Diagnosis present

## 2018-06-11 DIAGNOSIS — E785 Hyperlipidemia, unspecified: Secondary | ICD-10-CM | POA: Diagnosis present

## 2018-06-11 DIAGNOSIS — R531 Weakness: Secondary | ICD-10-CM | POA: Diagnosis present

## 2018-06-11 DIAGNOSIS — D62 Acute posthemorrhagic anemia: Secondary | ICD-10-CM | POA: Diagnosis not present

## 2018-06-11 DIAGNOSIS — I4819 Other persistent atrial fibrillation: Principal | ICD-10-CM | POA: Diagnosis present

## 2018-06-11 DIAGNOSIS — Z66 Do not resuscitate: Secondary | ICD-10-CM | POA: Diagnosis present

## 2018-06-11 DIAGNOSIS — R627 Adult failure to thrive: Secondary | ICD-10-CM | POA: Diagnosis present

## 2018-06-11 DIAGNOSIS — R34 Anuria and oliguria: Secondary | ICD-10-CM | POA: Diagnosis not present

## 2018-06-11 DIAGNOSIS — Y92230 Patient room in hospital as the place of occurrence of the external cause: Secondary | ICD-10-CM | POA: Diagnosis not present

## 2018-06-11 DIAGNOSIS — Z87440 Personal history of urinary (tract) infections: Secondary | ICD-10-CM

## 2018-06-11 DIAGNOSIS — R0600 Dyspnea, unspecified: Secondary | ICD-10-CM

## 2018-06-11 DIAGNOSIS — I340 Nonrheumatic mitral (valve) insufficiency: Secondary | ICD-10-CM | POA: Diagnosis not present

## 2018-06-11 DIAGNOSIS — Z9049 Acquired absence of other specified parts of digestive tract: Secondary | ICD-10-CM

## 2018-06-11 DIAGNOSIS — B370 Candidal stomatitis: Secondary | ICD-10-CM | POA: Diagnosis not present

## 2018-06-11 DIAGNOSIS — J9819 Other pulmonary collapse: Secondary | ICD-10-CM

## 2018-06-11 DIAGNOSIS — I132 Hypertensive heart and chronic kidney disease with heart failure and with stage 5 chronic kidney disease, or end stage renal disease: Secondary | ICD-10-CM | POA: Diagnosis present

## 2018-06-11 DIAGNOSIS — Z992 Dependence on renal dialysis: Secondary | ICD-10-CM

## 2018-06-11 DIAGNOSIS — I953 Hypotension of hemodialysis: Secondary | ICD-10-CM | POA: Diagnosis not present

## 2018-06-11 DIAGNOSIS — S42202A Unspecified fracture of upper end of left humerus, initial encounter for closed fracture: Secondary | ICD-10-CM | POA: Diagnosis not present

## 2018-06-11 DIAGNOSIS — Z79899 Other long term (current) drug therapy: Secondary | ICD-10-CM

## 2018-06-11 DIAGNOSIS — J869 Pyothorax without fistula: Secondary | ICD-10-CM | POA: Diagnosis not present

## 2018-06-11 DIAGNOSIS — I5043 Acute on chronic combined systolic (congestive) and diastolic (congestive) heart failure: Secondary | ICD-10-CM | POA: Diagnosis present

## 2018-06-11 DIAGNOSIS — Z90710 Acquired absence of both cervix and uterus: Secondary | ICD-10-CM

## 2018-06-11 DIAGNOSIS — T378X5A Adverse effect of other specified systemic anti-infectives and antiparasitics, initial encounter: Secondary | ICD-10-CM | POA: Diagnosis not present

## 2018-06-11 DIAGNOSIS — R441 Visual hallucinations: Secondary | ICD-10-CM | POA: Diagnosis not present

## 2018-06-11 DIAGNOSIS — E43 Unspecified severe protein-calorie malnutrition: Secondary | ICD-10-CM | POA: Diagnosis present

## 2018-06-11 DIAGNOSIS — Z794 Long term (current) use of insulin: Secondary | ICD-10-CM

## 2018-06-11 DIAGNOSIS — L89152 Pressure ulcer of sacral region, stage 2: Secondary | ICD-10-CM | POA: Diagnosis not present

## 2018-06-11 DIAGNOSIS — F419 Anxiety disorder, unspecified: Secondary | ICD-10-CM | POA: Diagnosis not present

## 2018-06-11 DIAGNOSIS — E871 Hypo-osmolality and hyponatremia: Secondary | ICD-10-CM | POA: Diagnosis not present

## 2018-06-11 DIAGNOSIS — I4891 Unspecified atrial fibrillation: Secondary | ICD-10-CM

## 2018-06-11 DIAGNOSIS — N2581 Secondary hyperparathyroidism of renal origin: Secondary | ICD-10-CM | POA: Diagnosis present

## 2018-06-11 DIAGNOSIS — Z9689 Presence of other specified functional implants: Secondary | ICD-10-CM

## 2018-06-11 DIAGNOSIS — E11649 Type 2 diabetes mellitus with hypoglycemia without coma: Secondary | ICD-10-CM | POA: Diagnosis not present

## 2018-06-11 DIAGNOSIS — E1129 Type 2 diabetes mellitus with other diabetic kidney complication: Secondary | ICD-10-CM | POA: Diagnosis present

## 2018-06-11 DIAGNOSIS — E1122 Type 2 diabetes mellitus with diabetic chronic kidney disease: Secondary | ICD-10-CM | POA: Diagnosis present

## 2018-06-11 DIAGNOSIS — J939 Pneumothorax, unspecified: Secondary | ICD-10-CM

## 2018-06-11 DIAGNOSIS — J189 Pneumonia, unspecified organism: Secondary | ICD-10-CM | POA: Diagnosis present

## 2018-06-11 DIAGNOSIS — D6959 Other secondary thrombocytopenia: Secondary | ICD-10-CM | POA: Diagnosis present

## 2018-06-11 DIAGNOSIS — E1142 Type 2 diabetes mellitus with diabetic polyneuropathy: Secondary | ICD-10-CM | POA: Diagnosis present

## 2018-06-11 DIAGNOSIS — K529 Noninfective gastroenteritis and colitis, unspecified: Secondary | ICD-10-CM | POA: Diagnosis present

## 2018-06-11 DIAGNOSIS — J8410 Pulmonary fibrosis, unspecified: Secondary | ICD-10-CM | POA: Diagnosis present

## 2018-06-11 DIAGNOSIS — Z9889 Other specified postprocedural states: Secondary | ICD-10-CM

## 2018-06-11 DIAGNOSIS — L8990 Pressure ulcer of unspecified site, unspecified stage: Secondary | ICD-10-CM | POA: Diagnosis present

## 2018-06-11 DIAGNOSIS — J90 Pleural effusion, not elsewhere classified: Secondary | ICD-10-CM | POA: Diagnosis present

## 2018-06-11 DIAGNOSIS — B965 Pseudomonas (aeruginosa) (mallei) (pseudomallei) as the cause of diseases classified elsewhere: Secondary | ICD-10-CM | POA: Diagnosis present

## 2018-06-11 DIAGNOSIS — I471 Supraventricular tachycardia: Secondary | ICD-10-CM | POA: Diagnosis present

## 2018-06-11 DIAGNOSIS — Z882 Allergy status to sulfonamides status: Secondary | ICD-10-CM

## 2018-06-11 DIAGNOSIS — D631 Anemia in chronic kidney disease: Secondary | ICD-10-CM | POA: Diagnosis present

## 2018-06-11 DIAGNOSIS — L89890 Pressure ulcer of other site, unstageable: Secondary | ICD-10-CM | POA: Diagnosis present

## 2018-06-11 DIAGNOSIS — Z9862 Peripheral vascular angioplasty status: Secondary | ICD-10-CM

## 2018-06-11 DIAGNOSIS — R64 Cachexia: Secondary | ICD-10-CM | POA: Diagnosis present

## 2018-06-11 DIAGNOSIS — E118 Type 2 diabetes mellitus with unspecified complications: Secondary | ICD-10-CM

## 2018-06-11 DIAGNOSIS — Z7901 Long term (current) use of anticoagulants: Secondary | ICD-10-CM

## 2018-06-11 DIAGNOSIS — R590 Localized enlarged lymph nodes: Secondary | ICD-10-CM | POA: Diagnosis present

## 2018-06-11 DIAGNOSIS — N186 End stage renal disease: Secondary | ICD-10-CM | POA: Diagnosis not present

## 2018-06-11 DIAGNOSIS — Z8781 Personal history of (healed) traumatic fracture: Secondary | ICD-10-CM

## 2018-06-11 DIAGNOSIS — R197 Diarrhea, unspecified: Secondary | ICD-10-CM

## 2018-06-11 DIAGNOSIS — Z9181 History of falling: Secondary | ICD-10-CM

## 2018-06-11 DIAGNOSIS — Z79891 Long term (current) use of opiate analgesic: Secondary | ICD-10-CM

## 2018-06-11 DIAGNOSIS — W19XXXA Unspecified fall, initial encounter: Secondary | ICD-10-CM | POA: Diagnosis present

## 2018-06-11 DIAGNOSIS — I252 Old myocardial infarction: Secondary | ICD-10-CM

## 2018-06-11 DIAGNOSIS — R911 Solitary pulmonary nodule: Secondary | ICD-10-CM | POA: Diagnosis present

## 2018-06-11 DIAGNOSIS — R296 Repeated falls: Secondary | ICD-10-CM | POA: Diagnosis present

## 2018-06-11 DIAGNOSIS — I5042 Chronic combined systolic (congestive) and diastolic (congestive) heart failure: Secondary | ICD-10-CM | POA: Diagnosis present

## 2018-06-11 DIAGNOSIS — K219 Gastro-esophageal reflux disease without esophagitis: Secondary | ICD-10-CM | POA: Diagnosis present

## 2018-06-11 DIAGNOSIS — S42302A Unspecified fracture of shaft of humerus, left arm, initial encounter for closed fracture: Secondary | ICD-10-CM | POA: Diagnosis present

## 2018-06-11 HISTORY — DX: Other pulmonary collapse: J98.19

## 2018-06-11 HISTORY — DX: Chronic combined systolic (congestive) and diastolic (congestive) heart failure: I50.42

## 2018-06-11 HISTORY — DX: Pleural effusion, not elsewhere classified: J90

## 2018-06-11 HISTORY — DX: Paroxysmal atrial fibrillation: I48.0

## 2018-06-11 HISTORY — DX: Nonrheumatic mitral (valve) insufficiency: I34.0

## 2018-06-11 LAB — BASIC METABOLIC PANEL
Anion gap: 17 — ABNORMAL HIGH (ref 5–15)
BUN: 55 mg/dL — ABNORMAL HIGH (ref 8–23)
CO2: 23 mmol/L (ref 22–32)
Calcium: 8.5 mg/dL — ABNORMAL LOW (ref 8.9–10.3)
Chloride: 93 mmol/L — ABNORMAL LOW (ref 98–111)
Creatinine, Ser: 5.91 mg/dL — ABNORMAL HIGH (ref 0.44–1.00)
GFR calc Af Amer: 8 mL/min — ABNORMAL LOW (ref >60)
GFR calc non Af Amer: 6 mL/min — ABNORMAL LOW (ref >60)
Glucose, Bld: 127 mg/dL — ABNORMAL HIGH (ref 70–99)
Potassium: 4 mmol/L (ref 3.5–5.1)
Sodium: 133 mmol/L — ABNORMAL LOW (ref 135–145)

## 2018-06-11 LAB — CBC
HCT: 33.3 % — ABNORMAL LOW (ref 36.0–46.0)
Hemoglobin: 10.4 g/dL — ABNORMAL LOW (ref 12.0–15.0)
MCH: 27.1 pg (ref 26.0–34.0)
MCHC: 31.2 g/dL (ref 30.0–36.0)
MCV: 86.7 fL (ref 80.0–100.0)
Platelets: 127 10*3/uL — ABNORMAL LOW (ref 150–400)
RBC: 3.84 MIL/uL — ABNORMAL LOW (ref 3.87–5.11)
RDW: 16.6 % — ABNORMAL HIGH (ref 11.5–15.5)
WBC: 10.9 10*3/uL — ABNORMAL HIGH (ref 4.0–10.5)
nRBC: 0 % (ref 0.0–0.2)

## 2018-06-11 LAB — PROTIME-INR
INR: 4 — ABNORMAL HIGH (ref 0.8–1.2)
Prothrombin Time: 38.4 seconds — ABNORMAL HIGH (ref 11.4–15.2)

## 2018-06-11 LAB — ECHOCARDIOGRAM LIMITED
Height: 64 in
Weight: 1911.83 oz

## 2018-06-11 LAB — GLUCOSE, CAPILLARY
Glucose-Capillary: 108 mg/dL — ABNORMAL HIGH (ref 70–99)
Glucose-Capillary: 140 mg/dL — ABNORMAL HIGH (ref 70–99)
Glucose-Capillary: 95 mg/dL (ref 70–99)
Glucose-Capillary: 91 mg/dL (ref 70–99)

## 2018-06-11 LAB — MRSA PCR SCREENING: MRSA by PCR: NEGATIVE

## 2018-06-11 LAB — MAGNESIUM: Magnesium: 2.1 mg/dL (ref 1.7–2.4)

## 2018-06-11 LAB — BRAIN NATRIURETIC PEPTIDE: B Natriuretic Peptide: 1119.1 pg/mL — ABNORMAL HIGH (ref 0.0–100.0)

## 2018-06-11 MED ORDER — OXYCODONE HCL 5 MG PO TABS
5.0000 mg | ORAL_TABLET | ORAL | Status: DC | PRN
  Filled 2018-06-11: qty 1

## 2018-06-11 MED ORDER — COLLAGENASE 250 UNIT/GM EX OINT
TOPICAL_OINTMENT | Freq: Every day | CUTANEOUS | Status: DC
  Administered 2018-06-11 – 2018-06-17 (×7): via TOPICAL
  Administered 2018-06-18 – 2018-06-19 (×2): 1 via TOPICAL
  Administered 2018-06-20 – 2018-06-27 (×6): via TOPICAL
  Filled 2018-06-11 (×3): qty 30

## 2018-06-11 MED ORDER — ACETAMINOPHEN 500 MG PO TABS: 500.0000 mg | ORAL_TABLET | Freq: Four times a day (QID) | ORAL | Status: DC | PRN

## 2018-06-11 MED ORDER — WARFARIN - PHARMACIST DOSING INPATIENT
Freq: Every day | Status: DC
  Administered 2018-06-13: 18:00:00

## 2018-06-11 MED ORDER — ATORVASTATIN CALCIUM 10 MG PO TABS
20.0000 mg | ORAL_TABLET | Freq: Every day | ORAL | Status: DC
  Administered 2018-06-11 – 2018-06-26 (×16): 20 mg via ORAL
  Filled 2018-06-11 (×16): qty 2

## 2018-06-11 MED ORDER — ACETAMINOPHEN 650 MG RE SUPP: 650.0000 mg | Freq: Four times a day (QID) | RECTAL | Status: DC | PRN

## 2018-06-11 MED ORDER — CHLORHEXIDINE GLUCONATE CLOTH 2 % EX PADS
6.0000 | MEDICATED_PAD | Freq: Every day | CUTANEOUS | Status: DC
  Administered 2018-06-11: 6 via TOPICAL

## 2018-06-11 MED ORDER — AMIODARONE HCL IN DEXTROSE 360-4.14 MG/200ML-% IV SOLN
30.0000 mg/h | INTRAVENOUS | Status: DC
  Administered 2018-06-11 – 2018-06-16 (×11): 30 mg/h via INTRAVENOUS
  Filled 2018-06-11 (×13): qty 200

## 2018-06-11 MED ORDER — ACETAMINOPHEN 325 MG PO TABS
650.0000 mg | ORAL_TABLET | Freq: Four times a day (QID) | ORAL | Status: DC | PRN
  Administered 2018-06-18: 650 mg via ORAL
  Filled 2018-06-11: qty 2

## 2018-06-11 MED ORDER — DIPHENOXYLATE-ATROPINE 2.5-0.025 MG PO TABS: 1.0000 | ORAL_TABLET | Freq: Four times a day (QID) | ORAL | Status: DC | PRN

## 2018-06-11 MED ORDER — HEPARIN SODIUM (PORCINE) 5000 UNIT/ML IJ SOLN: 5000.0000 [IU] | Freq: Three times a day (TID) | INTRAMUSCULAR | Status: DC

## 2018-06-11 MED ORDER — INSULIN ASPART 100 UNIT/ML ~~LOC~~ SOLN
0.0000 [IU] | Freq: Three times a day (TID) | SUBCUTANEOUS | Status: DC
  Administered 2018-06-11: 1 [IU] via SUBCUTANEOUS

## 2018-06-11 MED ORDER — AMIODARONE HCL IN DEXTROSE 360-4.14 MG/200ML-% IV SOLN
30.0000 mg/h | INTRAVENOUS | Status: DC
  Filled 2018-06-11: qty 200

## 2018-06-11 MED ORDER — METOPROLOL TARTRATE 25 MG PO TABS
25.0000 mg | ORAL_TABLET | Freq: Two times a day (BID) | ORAL | Status: DC
  Administered 2018-06-11 – 2018-06-17 (×14): 25 mg via ORAL
  Filled 2018-06-11 (×14): qty 1

## 2018-06-11 MED ORDER — BIOTENE DRY MOUTH MT LIQD: 15.0000 mL | OROMUCOSAL | Status: DC | PRN

## 2018-06-11 NOTE — Consult Note (Addendum)
Cardiology Consultation:   Patient ID: Vanessa Webb MRN: [id number]; DOB: 09-Sep-1945  Admit date: 06/11/2018 Date of Consult: 06/11/2018  Primary Care Provider: Monico Blitz, MD Primary Cardiologist: Carlyle Dolly, MD  Primary Electrophysiologist:  None    Patient Profile:   Vanessa Webb is a 73 y.o. female with a hx of paroxysmal atrial fibrillation, chronic anticoagulation therapy with Coumadin, end-stage renal disease on hemodialysis, chronic anemia, thrombocytopenia, type 2 diabetes, hypertension and hyperlipidemia who is being seen today for the evaluation of atrial fibrillation with RVR at the request of Dr. Cruzita Lederer, Internal Medicine.  History of Present Illness:   Vanessa Webb lives in [place] and is followed by Dr. Harl Bowie.  She has a history of paroxysmal atrial fibrillation and is on chronic anticoagulation therapy with Coumadin. CHA2DS2 VASc score =4.  Her INRs are followed in our Coumadin clinic.  She also has end-stage renal disease and is on hemodialysis, T,TH,SAT.  She also has chronic anemia, chronic thrombocytopenia, type 2 diabetes, hyperlipidemia, hypertension as well as some hypotension during dialysis.  Echocardiogram in 2018 showed normal LV function, 55-60%.  The left atrium was moderately dilated.  There was also moderate mitral regurgitation noted.  She initially presented to [facility] yesterday with chief complaint of weakness.  She was so weak that she ended up skipping her hemodialysis session yesterday.  At [facility] she was found to be in atrial fibrillation with rapid ventricular response.  She was initially started on IV diltiazem but later transitioned to IV amiodarone given poorly controlled rates.  She was transferred to [facility] given need for hemodialysis.  Patient was admitted by internal medicine.  Cardiology asked to assist with management of atrial fibrillation.  She remains on IV amiodarone.  P.o. metoprolol ordered.  Pharmacy  consult has been placed to dose and manage coumadin.  Labs show normal potassium at 4.0.  White count 10.9.  Temp 97.4.  Hemoglobin 10.4, consistent with baseline.  Platelets 127.  Sodium 133. Chest x-ray shows moderate right pleural effusion with suggestion of loculation superiorly. Underlying right-sided airspace disease could represent atelectasis and/or infection.  Past Medical History:  Diagnosis Date  . Anemia    takes Folic Acid;pt states she gets an injection every 2wks   . Closed right hip fracture, initial encounter ([place]) 10/19/2017  . Depression    but doesn't take any meds for it  . Diabetes mellitus    takes Lantus and Novolog 70/30;fasting blood sugar 200;Type 2  . GERD (gastroesophageal reflux disease)    takes Omeprazole daily  . GI bleeding   . History of bladder infections    last one a month ago;saw Dr.Wrenn and cysto was done in office;was on antibiotics and completed 2wks ago  . History of blood transfusion    no blood transfusion  . HLD (hyperlipidemia) 10/19/2017  . HTN (hypertension) 10/19/2017  . Hyperlipidemia    takes Zocor daily  . Hypertension    takes Coreg daily  . Myocardial infarction ([place]) 2010  . Pancreatitis   . Peripheral neuropathy   . UTI (lower urinary tract infection)     Past Surgical History:  Procedure Laterality Date  . A/V FISTULAGRAM Right 12/03/2016   Procedure: A/V Fistulagram - Right Arm;  Surgeon: Angelia Mould, MD;  Location: [place] [facility];  Service: Cardiovascular;  Laterality: Right;  . ABDOMINAL HYSTERECTOMY  1976  . APPENDECTOMY    . BASCILIC VEIN TRANSPOSITION Right 09/11/2012   Procedure: BASCILIC VEIN TRANSPOSITION- RIGHT  ARM;  Surgeon: Mal Misty, MD;  Location: [place];  Service: Vascular;  Laterality: Right;  . bilateral laser surgery    . CARDIAC CATHETERIZATION  2010  . CATARACT EXTRACTION W/PHACO  03/01/2011   Procedure: CATARACT EXTRACTION PHACO AND INTRAOCULAR LENS PLACEMENT (IOC);  Surgeon: Tonny Branch;  Location: [facility];  Service: Ophthalmology;  Laterality: Right;  CDE: 15.83  . CHOLECYSTECTOMY  2010  . DILATION AND CURETTAGE OF UTERUS  1965  . ESOPHAGOGASTRODUODENOSCOPY    . hemorrhage to bilateral eye    . INTRAMEDULLARY (IM) NAIL INTERTROCHANTERIC Right 10/20/2017   Procedure: INTRAMEDULLARY (IM) NAIL INTERTROCHANTRIC FEMORAL NAIL;  Surgeon: Mcarthur Rossetti, MD;  Location: [place];  Service: Orthopedics;  Laterality: Right;  . PERIPHERAL VASCULAR BALLOON ANGIOPLASTY Right 12/03/2016   Procedure: PERIPHERAL VASCULAR BALLOON ANGIOPLASTY;  Surgeon: Angelia Mould, MD;  Location: [place] [facility];  Service: Cardiovascular;  Laterality: Right;  arm fistula  . ruptured ulcer in stomach  2010   had a feeding tube over a month  . SHUNTOGRAM N/A 12/02/2012   Procedure: Fistulagram;  Surgeon: Serafina Mitchell, MD;  Location: [facility] C[facility];  Service: Cardiovascular;  Laterality: N/A;     Home Medications:  Prior to Admission medications   Medication Sig Start Date End Date Taking? Authorizing Provider  acetaminophen (TYLENOL) 500 MG tablet Take 500 mg by mouth every 6 (six) hours as needed (for pain.).   Yes [provider]  amLODipine (NORVASC) 2.5 MG tablet TAKE ONE TABLET BY MOUTH ON NON DIALYSIS DAYS. (MONDAY, [place], [place]) Patient taking differently: Take 2.5 mg by mouth See admin instructions. TAKE ONE TABLET BY MOUTH ON NON DIALYSIS DAYS. (MONDAY, WEDNESDAY, [place]) 02/24/18  Yes Herminio Commons, MD  atorvastatin (LIPITOR) 20 MG tablet Take 20 mg by mouth at bedtime.   Yes [provider]  lidocaine-prilocaine (EMLA) cream Apply 1 application topically daily as needed (prior to port being accessed).  02/15/15  Yes [provider]  metoprolol tartrate (LOPRESSOR) 25 MG tablet TAKE 1 AND 1/2 TABLETS BY MOUTH TWICE DAILY. (MORNING,BEDTIME) Patient taking differently: Take 25 mg by mouth 2 (two) times daily.  08/21/17  Yes  Branch, Alphonse Guild, MD  oxyCODONE (OXY IR/ROXICODONE) 5 MG immediate release tablet Take 1 tablet (5 mg total) by mouth every 4 (four) hours as needed for severe pain. 10/24/17  Yes Shelly Coss, MD  warfarin (COUMADIN) 2.5 MG tablet Take 1 tablet daily except 1/2 tablet on Saturdays as directed Patient taking differently: Take 1.25-2.5 mg by mouth See admin instructions. Take 2.5MG  by mouth daily except 1.25MG  on saturdays. 12/16/17  Yes Arnoldo Lenis, MD    Inpatient Medications: Scheduled Meds: . atorvastatin  20 mg Oral QHS  . insulin aspart  0-9 Units Subcutaneous TID WC  . metoprolol tartrate  25 mg Oral BID   Continuous Infusions: . amiodarone 30 mg/hr (06/11/18 0851)   PRN Meds: acetaminophen **OR** acetaminophen, acetaminophen, diphenoxylate-atropine, oxyCODONE  Allergies:    Allergies  Allergen Reactions  . Sulfa Antibiotics Nausea Only    Social History:   Social History   Socioeconomic History  . Marital status: Married    Spouse name: Not on file  . Number of children: Not on file  . Years of education: Not on file  . Highest education level: Not on file  Occupational History  . Not on file  Social Needs  . Financial resource strain: Not on file  . Food  insecurity:    Worry: Not on file    Inability: Not on file  . Transportation needs:    Medical: Not on file    Non-medical: Not on file  Tobacco Use  . Smoking status: Never Smoker  . Smokeless tobacco: Never Used  Substance and Sexual Activity  . Alcohol use: No  . Drug use: No  . Sexual activity: Yes    Birth control/protection: Surgical  Lifestyle  . Physical activity:    Days per week: Not on file    Minutes per session: Not on file  . Stress: Not on file  Relationships  . Social connections:    Talks on phone: Not on file    Gets together: Not on file    Attends religious service: Not on file    Active member of club or organization: Not on file    Attends meetings of clubs or  organizations: Not on file    Relationship status: Not on file  . Intimate partner violence:    Fear of current or ex partner: Not on file    Emotionally abused: Not on file    Physically abused: Not on file    Forced sexual activity: Not on file  Other Topics Concern  . Not on file  Social History Narrative  . Not on file    Family History:    Family History  Problem Relation Age of Onset  . Diabetes Mother   . Cancer Father   . Diabetes Daughter   . Heart attack Daughter   . Pneumonia Daughter   . Anesthesia problems Neg Hx   . Hypotension Neg Hx   . Malignant hyperthermia Neg Hx   . Pseudochol deficiency Neg Hx      ROS:  Please see the history of present illness.   All other ROS reviewed and negative.     Physical Exam/Data:   Vitals:   06/11/18 0540 06/11/18 0616 06/11/18 0816  BP: 127/75  119/71  Pulse: (!) 140  (!) 122  Resp: (!) 28    Temp: (!) 97.5 F (36.4 C)  (!) 97.4 F (36.3 C)  TempSrc: Oral  Oral  SpO2: 97%  99%  Weight:  54.2 kg   Height:  5\' 4"  (1.626 m)    No intake or output data in the 24 hours ending 06/11/18 0946 Last 3 Weights 06/11/2018 10/24/2017 10/24/2017  Weight (lbs) 119 lb 7.8 oz 109 lb 12.6 oz 113 lb 12.1 oz  Weight (kg) 54.2 kg 49.8 kg 51.6 kg     Body mass index is 20.51 kg/m.  General:  Well nourished, well developed, in no acute distress HEENT: normal Lymph: no adenopathy Neck: + 4 cm JVD B/L Endocrine:  No thryomegaly Vascular: No carotid bruits; FA pulses 2+ bilaterally without bruits  Cardiac:  normal S1, S2; iRRR; 4/4 systolic murmur  Lungs:  Decreased BS on the right,  Abd: soft, nontender, no hepatomegaly  Ext: 1+ B/P pitting edema Musculoskeletal:  No deformities, BUE and BLE strength normal and equal Skin: warm and dry  Neuro:  CNs 2-12 intact, no focal abnormalities noted Psych:  Normal affect   EKG:  The EKG was personally reviewed and demonstrates:  No EKG in electronic file this admit for review. Will  obtain 12 lead. Telemetry:  Telemetry was personally reviewed and demonstrates:  A-fib with RVR 120 BPM  Relevant CV Studies:  Echocardiogram 2018 Study Conclusions  - Left ventricle: The cavity size was normal. Wall thickness  was   increased in a pattern of moderate LVH. Systolic function was   normal. The estimated ejection fraction was in the range of 55%   to 60%. Wall motion was normal; there were no regional wall   motion abnormalities. Diastolic dysfunction, indeterminate grade. - Aortic valve: Trileaflet; mildly calcified leaflets. - Mitral valve: Severely calcified annulus. Mildly thickened,   mildly calcified leaflets . There was moderate regurgitation. - Left atrium: The atrium was moderately dilated. - Pulmonic valve: There was mild regurgitation.    Laboratory Data:  Chemistry Recent Labs  Lab 06/11/18 0640  NA 133*  K 4.0  CL 93*  CO2 23  GLUCOSE 127*  BUN 55*  CREATININE 5.91*  CALCIUM 8.5*  GFRNONAA 6*  GFRAA 8*  ANIONGAP 17*    No results for input(s): PROT, ALBUMIN, AST, ALT, ALKPHOS, BILITOT in the last 168 hours. Hematology Recent Labs  Lab 06/11/18 0640  WBC 10.9*  RBC 3.84*  HGB 10.4*  HCT 33.3*  MCV 86.7  MCH 27.1  MCHC 31.2  RDW 16.6*  PLT 127*   Cardiac EnzymesNo results for input(s): TROPONINI in the last 168 hours. No results for input(s): TROPIPOC in the last 168 hours.  BNPNo results for input(s): BNP, PROBNP in the last 168 hours.  DDimer No results for input(s): DDIMER in the last 168 hours.  Radiology/Studies:  Dg Chest Port 1 View  Result Date: 06/11/2018 CLINICAL DATA:  Pleural effusion. Hypertension. Diabetes. Prior myocardial infarction. EXAM: PORTABLE CHEST 1 VIEW COMPARISON:  06/10/2018 and 03/27/2018. FINDINGS: Patient rotated to the right. Midline trachea. Mild cardiomegaly. A moderate right pleural effusion with suggestion of loculation superiorly, similar. Tiny left pleural effusion. No pneumothorax. Similar  lower lung predominant right-sided airspace disease. The left lung remains clear. Atherosclerosis in the transverse aorta. Proximal left humerus fracture. IMPRESSION: No significant change since one day prior. Moderate right pleural effusion with suggestion of loculation superiorly. Underlying right-sided airspace disease could represent atelectasis and/or infection. Aortic Atherosclerosis (ICD10-I70.0). Tiny left pleural effusion. Electronically Signed   By: Abigail Miyamoto M.D.   On: 06/11/2018 08:22    Assessment and Plan:   THEDORA RINGS is a 73 y.o. female with a hx of paroxysmal atrial fibrillation, chronic anticoagulation therapy with Coumadin, end-stage renal disease on hemodialysis, chronic anemia, thrombocytopenia, type 2 diabetes, hypertension and hyperlipidemia who is being seen today for the evaluation of atrial fibrillation with RVR at the request of Dr. Cruzita Lederer, Internal Medicine.  1.  Atrial fibrillation with RVR: Currently requiring IV amiodarone for rate control, I would continue.  P.o. metoprolol also ordered.  Continue Coumadin per pharmacy.  Keep electrolytes stable.  Potassium is normal today at 4.0.  Continue to monitor on telemetry closely.  MD to see later today and will provide further recommendations.  2. Acute diastolic CHF - she needs HD today and possible multiple consecutive days - we will obtain an echocardiogram to evaluate LVEF and degree of mitral regurgitation  3.  End-stage renal disease: On hemodialysis, T,TH,Sat.  Nephrology to manage  4.  Chronic anticoagulation: Coumadin per pharmacy.  INR goal between 2.0-3.0  5.  Chronic anemia/chronic thrombocytopenia: Likely secondary to anemia of chronic disease/CKD.  Monitor.  6.  Hypertension: Controlled this morning,  119/71.  It is outlined in clinic notes that she has had issues, at times, with hypotension during hemodialysis.  7.  Hyperlipidemia: On statin therapy with atorvastatin 20 mg nightly.  8.  Mitral  regurgitation: Moderate mitral regurgitation was noted on  echocardiogram in 2018, with moderately dilated left atrium.  Will repeat limited echocardiogram today.  9. Moderate right pleural effusion with suggestion of loculation superiorly. Underlying right-sided airspace disease could represent atelectasis and/or infection. Per primary team.  For questions or updates, please contact [place] Please consult [email or web address] for contact info under   Signed, Ena Dawley, MD 06/11/2018 9:46 AM

## 2018-06-11 NOTE — Progress Notes (Signed)
HD orders modified for 06/12/2018, by Dr Augustin Coupe.  Primary RN, Brittney  notified of above.

## 2018-06-11 NOTE — Consult Note (Signed)
Reason for Consult: Continuity of ESRD care Referring Physician: Marzetta Board MD [facility])  HPI:  73 year old Caucasian woman past medical history significant for insulin-dependent diabetes mellitus, hypertension, paroxysmal atrial fibrillation on chronic anticoagulation with warfarin, peripheral neuropathy, nonhealing ulcer right lower extremity and end-stage renal disease on hemodialysis on a TTS schedule ([facility], [place]).  Presented to [facility] yesterday with complaints of generalized weakness and found to have atrial fibrillation with rapid ventricular response.  She missed her dialysis yesterday on account of being in the emergency room and was transferred here overnight for additional management.  She denies any chest pain or shortness of breath and does not have any nausea, vomiting or diarrhea.  She does report asymmetric leg swelling affecting the left leg and is undergoing close management with wound care in [place] for her right leg ulcer.  She denies any urinary complaints and does not have any overt blood loss.  She has poor awareness of her dialysis prescription-does not know her dry weight.  Admission labs significant for normokalemia while chest x-ray showed bilateral pleural effusions.  She has been on an amiodarone drip overnight.  Past Medical History:  Diagnosis Date  . Anemia    takes Folic Acid;pt states she gets an injection every 2wks   . Closed right hip fracture, initial encounter ([place]) 10/19/2017  . Depression    but doesn't take any meds for it  . Diabetes mellitus    takes Lantus and Novolog 70/30;fasting blood sugar 200;Type 2  . GERD (gastroesophageal reflux disease)    takes Omeprazole daily  . GI bleeding   . History of bladder infections    last one a month ago;saw Dr.Wrenn and cysto was done in office;was on antibiotics and completed 2wks ago  . History of blood transfusion    no blood transfusion  . HLD (hyperlipidemia) 10/19/2017  . HTN  (hypertension) 10/19/2017  . Hyperlipidemia    takes Zocor daily  . Hypertension    takes Coreg daily  . Myocardial infarction ([place]) 2010  . Pancreatitis   . Peripheral neuropathy   . UTI (lower urinary tract infection)     Past Surgical History:  Procedure Laterality Date  . A/V FISTULAGRAM Right 12/03/2016   Procedure: A/V Fistulagram - Right Arm;  Surgeon: Angelia Mould, MD;  Location: [place] [facility];  Service: Cardiovascular;  Laterality: Right;  . ABDOMINAL HYSTERECTOMY  1976  . APPENDECTOMY    . BASCILIC VEIN TRANSPOSITION Right 09/11/2012   Procedure: BASCILIC VEIN TRANSPOSITION- RIGHT ARM;  Surgeon: Mal Misty, MD;  Location: [place];  Service: Vascular;  Laterality: Right;  . bilateral laser surgery    . CARDIAC CATHETERIZATION  2010  . CATARACT EXTRACTION W/PHACO  03/01/2011   Procedure: CATARACT EXTRACTION PHACO AND INTRAOCULAR LENS PLACEMENT (IOC);  Surgeon: Tonny Branch;  [facility];  Service: Ophthalmology;  Laterality: Right;  CDE: 15.83  . CHOLECYSTECTOMY  2010  . DILATION AND CURETTAGE OF UTERUS  1965  . ESOPHAGOGASTRODUODENOSCOPY    . hemorrhage to bilateral eye    . INTRAMEDULLARY (IM) NAIL INTERTROCHANTERIC Right 10/20/2017   Procedure: INTRAMEDULLARY (IM) NAIL INTERTROCHANTRIC FEMORAL NAIL;  Surgeon: Mcarthur Rossetti, MD;  Location: [place];  Service: Orthopedics;  Laterality: Right;  . PERIPHERAL VASCULAR BALLOON ANGIOPLASTY Right 12/03/2016   Procedure: PERIPHERAL VASCULAR BALLOON ANGIOPLASTY;  Surgeon: Angelia Mould, MD;  Location: [place] [facility];  Service: Cardiovascular;  Laterality: Right;  arm fistula  . ruptured ulcer in stomach  2010   had a feeding tube over a month  . SHUNTOGRAM N/A 12/02/2012   Procedure: Fistulagram;  Surgeon: Serafina Mitchell, MD;  Location: [facility] CATH LAB;  Service: Cardiovascular;  Laterality: N/A;    Family History  Problem Relation Age of Onset  . Diabetes Mother   . Cancer Father   . Diabetes  Daughter   . Heart attack Daughter   . Pneumonia Daughter   . Anesthesia problems Neg Hx   . Hypotension Neg Hx   . Malignant hyperthermia Neg Hx   . Pseudochol deficiency Neg Hx     Social History:  reports that she has never smoked. She has never used smokeless tobacco. She reports that she does not drink alcohol or use drugs.  Allergies:  Allergies  Allergen Reactions  . Sulfa Antibiotics Nausea Only    Medications:  Scheduled: . atorvastatin  20 mg Oral QHS  . Chlorhexidine Gluconate Cloth  6 each Topical Q0600  . collagenase   Topical Daily  . insulin aspart  0-9 Units Subcutaneous TID WC  . metoprolol tartrate  25 mg Oral BID    BMP Latest Ref Rng & Units 06/11/2018 10/24/2017 10/23/2017  Glucose 70 - 99 mg/dL 127(H) 134(H) 106(H)  BUN 8 - 23 mg/dL 55(H) 38(H) 21  Creatinine 0.44 - 1.00 mg/dL 5.91(H) 5.57(H) 3.82(H)  Sodium 135 - 145 mmol/L 133(L) 134(L) 134(L)  Potassium 3.5 - 5.1 mmol/L 4.0 4.0 3.9  Chloride 98 - 111 mmol/L 93(L) 95(L) 96(L)  CO2 22 - 32 mmol/L 23 27 27   Calcium 8.9 - 10.3 mg/dL 8.5(L) 8.0(L) 7.6(L)   CBC Latest Ref Rng & Units 06/11/2018 10/24/2017 10/23/2017  WBC 4.0 - 10.5 K/uL 10.9(H) 5.9 6.0  Hemoglobin 12.0 - 15.0 g/dL 10.4(L) 9.1(L) 9.0(L)  Hematocrit 36.0 - 46.0 % 33.3(L) 28.5(L) 29.9(L)  Platelets 150 - 400 K/uL 127(L) 96(L) 94(L)   Dg Chest Port 1 View  Result Date: 06/11/2018 CLINICAL DATA:  Pleural effusion. Hypertension. Diabetes. Prior myocardial infarction. EXAM: PORTABLE CHEST 1 VIEW COMPARISON:  06/10/2018 and 03/27/2018. FINDINGS: Patient rotated to the right. Midline trachea. Mild cardiomegaly. A moderate right pleural effusion with suggestion of loculation superiorly, similar. Tiny left pleural effusion. No pneumothorax. Similar lower lung predominant right-sided airspace disease. The left lung remains clear. Atherosclerosis in the transverse aorta. Proximal left humerus fracture. IMPRESSION: No significant change since one day prior.  Moderate right pleural effusion with suggestion of loculation superiorly. Underlying right-sided airspace disease could represent atelectasis and/or infection. Aortic Atherosclerosis (ICD10-I70.0). Tiny left pleural effusion. Electronically Signed   By: Abigail Miyamoto M.D.   On: 06/11/2018 08:22    Review of Systems  Constitutional: Positive for malaise/fatigue. Negative for chills and fever.  HENT: Negative.   Eyes: Negative.   Respiratory: Negative.   Cardiovascular: Negative.   Gastrointestinal: Negative.   Genitourinary: Negative.   Musculoskeletal: Negative.   Skin: Negative.    Blood pressure 117/71, pulse (!) 103, temperature (!) 97.4 F (36.3 C), temperature source Oral, resp. rate 15, height 5\' 4"  (1.626 m), weight 54.2 kg, SpO2 97 %. Physical Exam  Nursing note and vitals reviewed. Constitutional: She is oriented to person, place, and time. She appears well-developed and well-nourished. No distress.  HENT:  Head: Normocephalic and atraumatic.  Mouth/Throat: Oropharynx is clear and moist.  Eyes: Pupils are equal, round, and reactive to light. Conjunctivae and EOM are normal. No scleral icterus.  Neck: Normal range of motion. Neck supple. No JVD present.  Cardiovascular: Normal rate.  Exam reveals no gallop.  No murmur heard. Irregularly irregular  Respiratory: Effort normal. She has rales.  Decreased breath sounds both bases with fine rales left side  GI: Soft. Bowel sounds are normal. There is no abdominal tenderness. There is no rebound.  Musculoskeletal:        General: Edema present.     Comments: 2+ left lower extremity edema, right pretibial clean gauze dressing.  Right upper arm arteriovenous fistula-pulsatile/aneurysmal  Neurological: She is alert and oriented to person, place, and time.  Skin: Skin is warm and dry. No rash noted.  Psychiatric: She has a normal mood and affect.    Assessment/Plan: 1.  Atrial fibrillation with rapid ventricular response:  Intermittent RVR noted overnight on amiodarone drip.  Currently appears to be off of anticoagulation; management per cardiology. 2.  End-stage renal disease: Missed hemodialysis yesterday and I will attempt cautious hemodialysis today and tomorrow (this might be limited by hypotension or RVR in her scenario).  Clearly, she is hypervolemic and requires efforts at lowering her dry weight to alleviate pedal edema and limit progression of pleural effusions.  We will call her dialysis unit and obtain additional records. 3.  Hyponatremia: Likely reflective of her inability to restrict intradialytic weight gain-reminded need to limit this to 32 ounces/day. 4.  Anemia: Hemoglobin and hematocrit currently within acceptable range, will continue to follow trend to decide on need for ESA therapy. 5.  Secondary hyperparathyroidism: We will check phosphorus level and reconcile medications for PTH management with the dialysis unit.  Chancy Claros K. 06/11/2018, 11:34 AM

## 2018-06-11 NOTE — H&P (Addendum)
History and Physical    Vanessa Webb KDP:[id number] DOB: June 05, 1945 DOA: 06/11/2018  I have briefly reviewed the patient's prior medical records in [place]  PCP: Monico Blitz, MD  Patient coming from: [facility]  Chief Complaint: Generalized weakness  HPI: Vanessa Webb is a 73 y.o. female with medical history significant of ESRD on TTS schedule, hypertension, hyperlipidemia, type 2 diabetes mellitus, on chronic anticoagulation with Coumadin presents to the hospital with chief complaint of generalized weakness.  She initially presented to [facility], was found to be in A. fib with RVR and a chest x-ray over there showed right-sided pleural effusion, she was placed on diltiazem infusion initially however it appears that she was transitioned to amiodarone since her rates were poorly controlled.  Given lack of nephrology at their hospital she was transferred here.  Patient complains of generalized weakness that is been going on for the past couple weeks, barely able to get up and move around due to lack of energy.  She missed her dialysis yesterday on 4/14 due to weakness.  She denies any chest pain, denies any shortness of breath.  She denies any palpitations.  She has not had any fever or chills, no cough or chest congestion.  No sick contacts.  She also has been having right lower extremity wounds that have been going on for the past month which are gradually improving with local wound care.  She denies any abdominal pain, nausea or vomiting.  Other than the weakness she is otherwise close to baseline  Review of Systems: As per HPI otherwise 10 point review of systems negative.   Past Medical History:  Diagnosis Date  . Anemia    takes Folic Acid;pt states she gets an injection every 2wks   . Closed right hip fracture, initial encounter ([place]) 10/19/2017  . Depression    but doesn't take any meds for it  . Diabetes mellitus    takes Lantus and Novolog 70/30;fasting blood sugar 200;Type 2  .  GERD (gastroesophageal reflux disease)    takes Omeprazole daily  . GI bleeding   . History of bladder infections    last one a month ago;saw Dr.Wrenn and cysto was done in office;was on antibiotics and completed 2wks ago  . History of blood transfusion    no blood transfusion  . HLD (hyperlipidemia) 10/19/2017  . HTN (hypertension) 10/19/2017  . Hyperlipidemia    takes Zocor daily  . Hypertension    takes Coreg daily  . Myocardial infarction ([place]) 2010  . Pancreatitis   . Peripheral neuropathy   . UTI (lower urinary tract infection)     Past Surgical History:  Procedure Laterality Date  . A/V FISTULAGRAM Right 12/03/2016   Procedure: A/V Fistulagram - Right Arm;  Surgeon: Angelia Mould, MD;  Location: [place] [facility];  Service: Cardiovascular;  Laterality: Right;  . ABDOMINAL HYSTERECTOMY  1976  . APPENDECTOMY    . BASCILIC VEIN TRANSPOSITION Right 09/11/2012   Procedure: BASCILIC VEIN TRANSPOSITION- RIGHT ARM;  Surgeon: Mal Misty, MD;  Location: [place];  Service: Vascular;  Laterality: Right;  . bilateral laser surgery    . CARDIAC CATHETERIZATION  2010  . CATARACT EXTRACTION W/PHACO  03/01/2011   Procedure: CATARACT EXTRACTION PHACO AND INTRAOCULAR LENS PLACEMENT (IOC);  Surgeon: Tonny Branch;  Location: [facility];  Service: Ophthalmology;  Laterality: Right;  CDE: 15.83  . CHOLECYSTECTOMY  2010  . DILATION AND CURETTAGE OF UTERUS  1965  . ESOPHAGOGASTRODUODENOSCOPY    .  hemorrhage to bilateral eye    . INTRAMEDULLARY (IM) NAIL INTERTROCHANTERIC Right 10/20/2017   Procedure: INTRAMEDULLARY (IM) NAIL INTERTROCHANTRIC FEMORAL NAIL;  Surgeon: Mcarthur Rossetti, MD;  Location: [place];  Service: Orthopedics;  Laterality: Right;  . PERIPHERAL VASCULAR BALLOON ANGIOPLASTY Right 12/03/2016   Procedure: PERIPHERAL VASCULAR BALLOON ANGIOPLASTY;  Surgeon: Angelia Mould, MD;  Location: [place] [facility];  Service: Cardiovascular;  Laterality: Right;  arm fistula  .  ruptured ulcer in stomach  2010   had a feeding tube over a month  . SHUNTOGRAM N/A 12/02/2012   Procedure: Fistulagram;  Surgeon: Serafina Mitchell, MD;  Location: [facility];  Service: Cardiovascular;  Laterality: N/A;     reports that she has never smoked. She has never used smokeless tobacco. She reports that she does not drink alcohol or use drugs.  Allergies  Allergen Reactions  . Sulfa Antibiotics Nausea Only    nausea    Family History  Problem Relation Age of Onset  . Diabetes Mother   . Cancer Father   . Diabetes Daughter   . Heart attack Daughter   . Pneumonia Daughter   . Anesthesia problems Neg Hx   . Hypotension Neg Hx   . Malignant hyperthermia Neg Hx   . Pseudochol deficiency Neg Hx     Prior to Admission medications   Medication Sig Start Date End Date Taking? Authorizing Provider  acetaminophen (TYLENOL) 500 MG tablet Take 500 mg by mouth every 6 (six) hours as needed (for pain.).    [provider]  amLODipine (NORVASC) 2.5 MG tablet TAKE ONE TABLET BY MOUTH ON NON DIALYSIS DAYS. ([place], [place], [place]) 02/24/18   Herminio Commons, MD  atorvastatin (LIPITOR) 20 MG tablet Take 20 mg by mouth at bedtime.    [provider]  diphenoxylate-atropine (LOMOTIL) 2.5-0.025 MG tablet Take 1 tablet by mouth 4 (four) times daily as needed for diarrhea or loose stools. 09/24/17   [provider]  insulin glargine (LANTUS) 100 UNIT/ML injection Inject 3-15 Units into the skin at bedtime. Sliding scale per patient    [provider]  lidocaine-prilocaine (EMLA) cream Apply 1 application topically daily as needed (prior to port being accessed).  02/15/15   [provider]  metoprolol tartrate (LOPRESSOR) 25 MG tablet TAKE 1 AND 1/2 TABLETS BY MOUTH TWICE DAILY. (MORNING,BEDTIME) Patient taking differently: Take 25 mg by mouth 2 (two) times daily.  08/21/17   Arnoldo Lenis, MD  oxyCODONE (OXY IR/ROXICODONE) 5 MG  immediate release tablet Take 1 tablet (5 mg total) by mouth every 4 (four) hours as needed for severe pain. 10/24/17   Shelly Coss, MD  warfarin (COUMADIN) 2.5 MG tablet Take 1 tablet daily except 1/2 tablet on Saturdays as directed 12/16/17   Arnoldo Lenis, MD    Physical Exam: Vitals:   06/11/18 0540 06/11/18 0616  BP: 127/75   Pulse: (!) 140   Resp: (!) 28   Temp: (!) 97.5 F (36.4 C)   TempSrc: Oral   SpO2: 97%   Weight:  54.2 kg  Height:  5\' 4"  (1.626 m)    Constitutional: NAD, calm, comfortable, pale appearing Eyes: PERRL, lids and conjunctivae normal ENMT: Mucous membranes are moist. Neck: normal, supple Respiratory: clear to auscultation bilaterally, no wheezing, no crackles. Normal respiratory effort.  Decreased breath sounds at the bases Cardiovascular: Irregularly irregular, no significant murmurs appreciated.  1+ lower extremity edema Abdomen: no tenderness, no masses palpated. Bowel sounds  positive.  Musculoskeletal: no clubbing / cyanosis.  Skin: no rashes. RLE wounds present on lateral and medial aspect right shin, no surrounding cellulitis Neurologic: CN 2-12 grossly intact. Strength 5/5 in all 4.  Psychiatric: Normal judgment and insight. Alert and oriented x 3. Normal mood.   Labs on Admission: I have personally reviewed following labs and imaging studies  CBC: No results for input(s): WBC, NEUTROABS, HGB, HCT, MCV, PLT in the last 168 hours. Basic Metabolic Panel: Recent Labs  Lab 06/11/18 0640  NA 133*  K 4.0  CL 93*  CO2 23  GLUCOSE 127*  BUN 55*  CREATININE 5.91*  CALCIUM 8.5*  MG 2.1   GFR: Estimated Creatinine Clearance: 7.1 mL/min (A) (by C-G formula based on SCr of 5.91 mg/dL (H)). Liver Function Tests: No results for input(s): AST, ALT, ALKPHOS, BILITOT, PROT, ALBUMIN in the last 168 hours. No results for input(s): LIPASE, AMYLASE in the last 168 hours. No results for input(s): AMMONIA in the last 168 hours. Coagulation  Profile: No results for input(s): INR, PROTIME in the last 168 hours. Cardiac Enzymes: No results for input(s): CKTOTAL, CKMB, CKMBINDEX, TROPONINI in the last 168 hours. BNP (last 3 results) No results for input(s): PROBNP in the last 8760 hours. HbA1C: No results for input(s): HGBA1C in the last 72 hours. CBG: No results for input(s): GLUCAP in the last 168 hours. Lipid Profile: No results for input(s): CHOL, HDL, LDLCALC, TRIG, CHOLHDL, LDLDIRECT in the last 72 hours. Thyroid Function Tests: No results for input(s): TSH, T4TOTAL, FREET4, T3FREE, THYROIDAB in the last 72 hours. Anemia Panel: No results for input(s): VITAMINB12, FOLATE, FERRITIN, TIBC, IRON, RETICCTPCT in the last 72 hours. Urine analysis:    Component Value Date/Time   COLORURINE YELLOW 01/21/2009 1719   APPEARANCEUR CLOUDY (A) 01/21/2009 1719   LABSPEC 1.021 01/21/2009 1719   PHURINE 5.0 01/21/2009 1719   GLUCOSEU NEGATIVE 01/21/2009 1719   HGBUR TRACE (A) 01/21/2009 1719   BILIRUBINUR MODERATE (A) 01/21/2009 1719   KETONESUR 15 (A) 01/21/2009 1719   PROTEINUR 30 (A) 01/21/2009 1719   UROBILINOGEN 1.0 01/21/2009 1719   NITRITE POSITIVE (A) 01/21/2009 1719   LEUKOCYTESUR MODERATE (A) 01/21/2009 1719     Radiological Exams on Admission: No results found.  EKG: Independently reviewed.  A. fib with RVR  Assessment/Plan Principal Problem:   Atrial fibrillation with RVR (HCC) Active Problems:   End stage renal disease (HCC)   DM (diabetes mellitus) with complications (HCC)   HTN (hypertension)   HLD (hyperlipidemia)   Principal Problem Paroxysmal A. fib with RVR -Potentially the cause of her weakness, patient had heart rates as high as 130s-140s.  Appears that she was initially on diltiazem but then transition to amiodarone given poorly controlled rates, however history is somewhat not clear to me at this point -She is followed by Dr. Harl [facility] in [facility] up in [place] cardiology,  appreciate input -Resume home metoprolol -Continue Coumadin per pharmacy  Active Problems ESRD -Consulted nephrology, she missed dialysis yesterday.  Chest x-ray apparently with a right-sided pleural effusion in the ED at the outside hospital.  Will repeat chest x-ray here.  Looks mildly fluid overloaded with lower extremity edema but she is on room air  Type 2 diabetes mellitus -Continue sliding scale insulin  Hypertension -She is on amlodipine on nondialysis days, hold that for now  Hyperlipidemia -Continue atorvastatin  LE wounds -wound care consulted  DVT prophylaxis: Coumadin  Code Status: DNR  Family Communication: no family at  bedside  Disposition Plan: home when ready  Consults called: cardiology, nephrology     Marzetta Board, MD, PhD [facility]  Contact via [email or web address]  [facility] Office Info P: [phone]  F: [phone]   06/11/2018, 7:37 AM

## 2018-06-11 NOTE — Progress Notes (Signed)
ANTICOAGULATION CONSULT NOTE - Initial Consult  Pharmacy Consult for Coumadin  Indication: atrial fibrillation with RVR, h/o afib on coumadin PTA.  Allergies  Allergen Reactions  . Sulfa Antibiotics Nausea Only    Patient Measurements: Height: 5\' 4"  (162.6 cm) Weight: 119 lb 7.8 oz (54.2 kg) IBW/kg (Calculated) : 54.7 Heparin Dosing Weight:  Vital Signs: Temp: 97.5 F (36.4 C) (04/15 1229) Temp Source: Oral (04/15 1229) BP: 117/71 (04/15 1116) Pulse Rate: 86 (04/15 1229)  Labs: Recent Labs    06/11/18 0640 06/11/18 0911  HGB 10.4*  --   HCT 33.3*  --   PLT 127*  --   LABPROT  --  38.4*  INR  --  4.0*  CREATININE 5.91*  --     Estimated Creatinine Clearance: 7.1 mL/min (A) (by C-G formula based on SCr of 5.91 mg/dL (H)).   Medical History: Past Medical History:  Diagnosis Date  . Anemia    takes Folic Acid;pt states she gets an injection every 2wks   . Closed right hip fracture, initial encounter ([place]) 10/19/2017  . Depression    but doesn't take any meds for it  . Diabetes mellitus    takes Lantus and Novolog 70/30;fasting blood sugar 200;Type 2  . GERD (gastroesophageal reflux disease)    takes Omeprazole daily  . GI bleeding   . History of bladder infections    last one a month ago;saw Dr.Wrenn and cysto was done in office;was on antibiotics and completed 2wks ago  . History of blood transfusion    no blood transfusion  . HLD (hyperlipidemia) 10/19/2017  . HTN (hypertension) 10/19/2017  . Hyperlipidemia    takes Zocor daily  . Hypertension    takes Coreg daily  . Myocardial infarction ([place]) 2010  . Pancreatitis   . Peripheral neuropathy   . UTI (lower urinary tract infection)     Medications:  Medications Prior to Admission  Medication Sig Dispense Refill Last Dose  . acetaminophen (TYLENOL) 500 MG tablet Take 500 mg by mouth every 6 (six) hours as needed (for pain.).   Past Month at Unknown time  . amLODipine (NORVASC) 2.5 MG tablet TAKE ONE  TABLET BY MOUTH ON NON DIALYSIS DAYS. (MONDAY, WEDNESDAY, FRIDAY & SUNDAY) (Patient taking differently: Take 2.5 mg by mouth See admin instructions. TAKE ONE TABLET BY MOUTH ON NON DIALYSIS DAYS. (MONDAY, WEDNESDAY, FRIDAY & SUNDAY)) 70 tablet 3 06/09/2018  . atorvastatin (LIPITOR) 20 MG tablet Take 20 mg by mouth at bedtime.   06/09/2018  . lidocaine-prilocaine (EMLA) cream Apply 1 application topically daily as needed (prior to port being accessed).    06/07/2018  . metoprolol tartrate (LOPRESSOR) 25 MG tablet TAKE 1 AND 1/2 TABLETS BY MOUTH TWICE DAILY. (MORNING,BEDTIME) (Patient taking differently: Take 25 mg by mouth 2 (two) times daily. ) 270 tablet 3 06/09/2018 at 8-10am  . oxyCODONE (OXY IR/ROXICODONE) 5 MG immediate release tablet Take 1 tablet (5 mg total) by mouth every 4 (four) hours as needed for severe pain. 20 tablet 0 Past Month at Unknown time  . warfarin (COUMADIN) 2.5 MG tablet Take 1 tablet daily except 1/2 tablet on Saturdays as directed (Patient taking differently: Take 1.25-2.5 mg by mouth See admin instructions. Take 2.5MG  by mouth daily except 1.25MG  on saturdays.) 30 tablet 6 06/09/2018 at 8:30PM    Assessment: 73 y.o female on chronic coumadin PTA for h/o PAF. Coumadin is followed by Dr. Harl Bowie in [facility] up in [place], [place].   She presented  to [facility] for genearlized weakness and she was found to be in A. fib with RVR. Started on IV amiodarone drip.  Transferred to [facility].   Admit  INR = 4 H/H 10.4/33.3, pltc 127, no bleeding reported. -ESRD- HD TTS, missed HD  Past history of GIB noted, date not reported.   PTA Warfarin 2.5 mg daily except 1.25 mg qSat, LD taken pta 4/13 @ 2030   Goal of Therapy:  INR 2-3 Monitor platelets by anticoagulation protocol: Yes   Plan:  Hold Coumadin today  Daily INR   Thank you for allowing pharmacy to be part of this patients care team. Nicole Cella, [place] Pharmacist Pager: [phone] Please check AMION for  all [place] phone numbers After 10:00 PM, call [place] [phone] 06/11/2018,1:16 PM

## 2018-06-11 NOTE — Progress Notes (Signed)
Notified MD patient has arrived from outside facility with amio gtt @ 16.6 ml/hr.

## 2018-06-11 NOTE — Consult Note (Signed)
[facility] Nurse wound consult note Reason for Consult: RLE wounds. Patient self reports her primary MD was "trying to arrange" for her to see a vascular surgeon. Patient also reports they started from her coumadin.  Wound type: full thickness ulcerations lateral and medial right lower extremity. It is noted on the nursing flowsheets that the patient has a pressure injury on her sacrum.   Pressure Injury POA: Yes/No/NA Measurement: RLE: lateral 12cm x 1.5cm x 0.2cm; 90% black soft eschar           Medial 4cm x 2.0cm x 0.2cm; 50% black soft eschar           Sacrum 0.4cm x 0.2cm x 0.1cm; scabbed Wound bed: see above  Drainage (amount, consistency, odor) minimal from leg wounds, none from the sacrum Periwound: intact, no edema LEs, weak distal pulse but foot warm.  Dressing procedure/placement/frequency: Enzymatic debridement ointment to the LE wounds, cover with saline damp 2x2s. Dry dressing.  Notified hospitalist of concern for arterial dx.  He will order ABI  Enzymatic debridement ointment to the sacrum to clean off small black area. Patient reports she has air cushion to sit on at home and during HD.    Discussed POC with patient and bedside nurse.  Re consult if needed, will not follow at this time. Thanks  Tomoki Lucken [employer], RN,CWOCN, CNS, [place] [phone])

## 2018-06-12 ENCOUNTER — Encounter (HOSPITAL_COMMUNITY): Payer: Medicare Other

## 2018-06-12 DIAGNOSIS — D631 Anemia in chronic kidney disease: Secondary | ICD-10-CM | POA: Diagnosis present

## 2018-06-12 DIAGNOSIS — B370 Candidal stomatitis: Secondary | ICD-10-CM | POA: Diagnosis not present

## 2018-06-12 DIAGNOSIS — B965 Pseudomonas (aeruginosa) (mallei) (pseudomallei) as the cause of diseases classified elsewhere: Secondary | ICD-10-CM | POA: Diagnosis not present

## 2018-06-12 DIAGNOSIS — I132 Hypertensive heart and chronic kidney disease with heart failure and with stage 5 chronic kidney disease, or end stage renal disease: Secondary | ICD-10-CM | POA: Diagnosis present

## 2018-06-12 DIAGNOSIS — E785 Hyperlipidemia, unspecified: Secondary | ICD-10-CM

## 2018-06-12 DIAGNOSIS — S81801A Unspecified open wound, right lower leg, initial encounter: Secondary | ICD-10-CM

## 2018-06-12 DIAGNOSIS — W19XXXA Unspecified fall, initial encounter: Secondary | ICD-10-CM | POA: Diagnosis present

## 2018-06-12 DIAGNOSIS — I10 Essential (primary) hypertension: Secondary | ICD-10-CM | POA: Diagnosis not present

## 2018-06-12 DIAGNOSIS — E11649 Type 2 diabetes mellitus with hypoglycemia without coma: Secondary | ICD-10-CM | POA: Diagnosis not present

## 2018-06-12 DIAGNOSIS — R34 Anuria and oliguria: Secondary | ICD-10-CM | POA: Diagnosis not present

## 2018-06-12 DIAGNOSIS — E43 Unspecified severe protein-calorie malnutrition: Secondary | ICD-10-CM | POA: Diagnosis present

## 2018-06-12 DIAGNOSIS — J869 Pyothorax without fistula: Secondary | ICD-10-CM | POA: Diagnosis present

## 2018-06-12 DIAGNOSIS — I471 Supraventricular tachycardia: Secondary | ICD-10-CM | POA: Diagnosis present

## 2018-06-12 DIAGNOSIS — Z992 Dependence on renal dialysis: Secondary | ICD-10-CM | POA: Diagnosis not present

## 2018-06-12 DIAGNOSIS — R64 Cachexia: Secondary | ICD-10-CM | POA: Diagnosis present

## 2018-06-12 DIAGNOSIS — D62 Acute posthemorrhagic anemia: Secondary | ICD-10-CM | POA: Diagnosis not present

## 2018-06-12 DIAGNOSIS — N2581 Secondary hyperparathyroidism of renal origin: Secondary | ICD-10-CM | POA: Diagnosis present

## 2018-06-12 DIAGNOSIS — S42202A Unspecified fracture of upper end of left humerus, initial encounter for closed fracture: Secondary | ICD-10-CM | POA: Diagnosis present

## 2018-06-12 DIAGNOSIS — I5042 Chronic combined systolic (congestive) and diastolic (congestive) heart failure: Secondary | ICD-10-CM | POA: Diagnosis not present

## 2018-06-12 DIAGNOSIS — E1122 Type 2 diabetes mellitus with diabetic chronic kidney disease: Secondary | ICD-10-CM | POA: Diagnosis present

## 2018-06-12 DIAGNOSIS — I953 Hypotension of hemodialysis: Secondary | ICD-10-CM | POA: Diagnosis not present

## 2018-06-12 DIAGNOSIS — J189 Pneumonia, unspecified organism: Secondary | ICD-10-CM | POA: Diagnosis present

## 2018-06-12 DIAGNOSIS — Y92230 Patient room in hospital as the place of occurrence of the external cause: Secondary | ICD-10-CM | POA: Diagnosis not present

## 2018-06-12 DIAGNOSIS — I4819 Other persistent atrial fibrillation: Secondary | ICD-10-CM | POA: Diagnosis present

## 2018-06-12 DIAGNOSIS — I4891 Unspecified atrial fibrillation: Secondary | ICD-10-CM | POA: Diagnosis not present

## 2018-06-12 DIAGNOSIS — R531 Weakness: Secondary | ICD-10-CM | POA: Diagnosis present

## 2018-06-12 DIAGNOSIS — M24512 Contracture, left shoulder: Secondary | ICD-10-CM | POA: Diagnosis not present

## 2018-06-12 DIAGNOSIS — N186 End stage renal disease: Secondary | ICD-10-CM | POA: Diagnosis present

## 2018-06-12 DIAGNOSIS — Z681 Body mass index (BMI) 19 or less, adult: Secondary | ICD-10-CM | POA: Diagnosis not present

## 2018-06-12 DIAGNOSIS — I480 Paroxysmal atrial fibrillation: Secondary | ICD-10-CM | POA: Diagnosis not present

## 2018-06-12 DIAGNOSIS — L89890 Pressure ulcer of other site, unstageable: Secondary | ICD-10-CM | POA: Diagnosis present

## 2018-06-12 DIAGNOSIS — I5043 Acute on chronic combined systolic (congestive) and diastolic (congestive) heart failure: Secondary | ICD-10-CM | POA: Diagnosis present

## 2018-06-12 DIAGNOSIS — Z66 Do not resuscitate: Secondary | ICD-10-CM | POA: Diagnosis present

## 2018-06-12 DIAGNOSIS — L89152 Pressure ulcer of sacral region, stage 2: Secondary | ICD-10-CM | POA: Diagnosis present

## 2018-06-12 DIAGNOSIS — R5381 Other malaise: Secondary | ICD-10-CM | POA: Diagnosis not present

## 2018-06-12 DIAGNOSIS — R627 Adult failure to thrive: Secondary | ICD-10-CM | POA: Diagnosis present

## 2018-06-12 DIAGNOSIS — E871 Hypo-osmolality and hyponatremia: Secondary | ICD-10-CM | POA: Diagnosis present

## 2018-06-12 DIAGNOSIS — D72825 Bandemia: Secondary | ICD-10-CM | POA: Diagnosis not present

## 2018-06-12 LAB — GLUCOSE, CAPILLARY
Glucose-Capillary: 71 mg/dL (ref 70–99)
Glucose-Capillary: 106 mg/dL — ABNORMAL HIGH (ref 70–99)
Glucose-Capillary: 107 mg/dL — ABNORMAL HIGH (ref 70–99)

## 2018-06-12 LAB — CBC
HCT: 32.8 % — ABNORMAL LOW (ref 36.0–46.0)
Hemoglobin: 10.3 g/dL — ABNORMAL LOW (ref 12.0–15.0)
MCH: 26.8 pg (ref 26.0–34.0)
MCHC: 31.4 g/dL (ref 30.0–36.0)
MCV: 85.2 fL (ref 80.0–100.0)
Platelets: 132 10*3/uL — ABNORMAL LOW (ref 150–400)
RBC: 3.85 MIL/uL — ABNORMAL LOW (ref 3.87–5.11)
RDW: 16.5 % — ABNORMAL HIGH (ref 11.5–15.5)
WBC: 10.9 10*3/uL — ABNORMAL HIGH (ref 4.0–10.5)
nRBC: 0 % (ref 0.0–0.2)

## 2018-06-12 LAB — COMPREHENSIVE METABOLIC PANEL
ALT: 23 U/L (ref 0–44)
AST: 42 U/L — ABNORMAL HIGH (ref 15–41)
Albumin: 1.8 g/dL — ABNORMAL LOW (ref 3.5–5.0)
Alkaline Phosphatase: 74 U/L (ref 38–126)
Anion gap: 17 — ABNORMAL HIGH (ref 5–15)
BUN: 63 mg/dL — ABNORMAL HIGH (ref 8–23)
CO2: 23 mmol/L (ref 22–32)
Calcium: 8.2 mg/dL — ABNORMAL LOW (ref 8.9–10.3)
Chloride: 92 mmol/L — ABNORMAL LOW (ref 98–111)
Creatinine, Ser: 6.73 mg/dL — ABNORMAL HIGH (ref 0.44–1.00)
GFR calc Af Amer: 6 mL/min — ABNORMAL LOW (ref >60)
GFR calc non Af Amer: 6 mL/min — ABNORMAL LOW (ref >60)
Glucose, Bld: 102 mg/dL — ABNORMAL HIGH (ref 70–99)
Potassium: 3.9 mmol/L (ref 3.5–5.1)
Sodium: 132 mmol/L — ABNORMAL LOW (ref 135–145)
Total Bilirubin: 0.9 mg/dL (ref 0.3–1.2)
Total Protein: 5.8 g/dL — ABNORMAL LOW (ref 6.5–8.1)

## 2018-06-12 LAB — PROTIME-INR
INR: 4 — ABNORMAL HIGH (ref 0.8–1.2)
Prothrombin Time: 38.1 seconds — ABNORMAL HIGH (ref 11.4–15.2)

## 2018-06-12 LAB — MAGNESIUM: Magnesium: 2 mg/dL (ref 1.7–2.4)

## 2018-06-12 MED ORDER — RENA-VITE PO TABS
1.0000 | ORAL_TABLET | Freq: Every day | ORAL | Status: DC
  Administered 2018-06-12 – 2018-06-26 (×15): 1 via ORAL
  Filled 2018-06-12 (×15): qty 1

## 2018-06-12 MED ORDER — NEPRO/CARBSTEADY PO LIQD
237.0000 mL | Freq: Two times a day (BID) | ORAL | Status: DC
  Administered 2018-06-12 – 2018-06-20 (×7): 237 mL via ORAL
  Filled 2018-06-12 (×13): qty 237

## 2018-06-12 NOTE — Progress Notes (Signed)
Progress Note  Patient Name: Vanessa Webb Date of Encounter: 06/12/2018  Primary Cardiologist: Vanessa Dolly, MD   Subjective   The patient denies any chest pain or SOB.  Inpatient Medications    Scheduled Meds: . atorvastatin  20 mg Oral QHS  . Chlorhexidine Gluconate Cloth  6 each Topical Q0600  . collagenase   Topical Daily  . insulin aspart  0-9 Units Subcutaneous TID WC  . metoprolol tartrate  25 mg Oral BID  . Warfarin - Pharmacist Dosing Inpatient   Does not apply q1800   Continuous Infusions: . amiodarone 30 mg/hr (06/12/18 0600)   PRN Meds: acetaminophen **OR** acetaminophen, acetaminophen, antiseptic oral rinse, diphenoxylate-atropine, oxyCODONE   Vital Signs    Vitals:   06/11/18 1950 06/11/18 2137 06/11/18 2358 06/12/18 0505  BP: 107/61 115/75 105/61 126/73  Pulse: (!) 115 (!) 108 (!) 107 (!) 103  Resp: 20  18 19   Temp: 97.9 F (36.6 C)  (!) 97.3 F (36.3 C) 97.8 F (36.6 C)  TempSrc: Oral  Oral Oral  SpO2: 96%  96% 99%  Weight:    54.3 kg  Height:        Intake/Output Summary (Last 24 hours) at 06/12/2018 0725 Last data filed at 06/12/2018 0600 Gross per 24 hour  Intake 591.15 ml  Output -  Net 591.15 ml   Filed Weights   06/11/18 0616 06/12/18 0505  Weight: 54.2 kg 54.3 kg   Telemetry    Atrial fibrillation with RVR- Personally Reviewed  ECG    No new tracing- Personally Reviewed  Physical Exam   Physical Exam per MD:  GEN: Caucasian female resting comfortably. Alert and in no acute distress.   Neck: Supple. No JVD. Cardiac: RRR. No murmurs, gallops, or rubs.  Respiratory: Clear to auscultation bilaterally. No wheezes, rhonchi, or rales. GI: Abdomen soft, non-distended, and non-tender. Bowel sounds present. Extremities: No lower extremity edema. No deformity. Radial and distal pedal pulses 2+ and equal bilaterally. Skin: Warm and dry. Neuro:  No focal deficits. Psych: Normal affect. Responds appropriately.  Labs     Chemistry Recent Labs  Lab 06/11/18 0640 06/12/18 0431  NA 133* 132*  K 4.0 3.9  CL 93* 92*  CO2 23 23  GLUCOSE 127* 102*  BUN 55* 63*  CREATININE 5.91* 6.73*  CALCIUM 8.5* 8.2*  PROT  --  5.8*  ALBUMIN  --  1.8*  AST  --  42*  ALT  --  23  ALKPHOS  --  74  BILITOT  --  0.9  GFRNONAA 6* 6*  GFRAA 8* 6*  ANIONGAP 17* 17*     Hematology Recent Labs  Lab 06/11/18 0640 06/12/18 0431  WBC 10.9* 10.9*  RBC 3.84* 3.85*  HGB 10.4* 10.3*  HCT 33.3* 32.8*  MCV 86.7 85.2  MCH 27.1 26.8  MCHC 31.2 31.4  RDW 16.6* 16.5*  PLT 127* 132*   Cardiac EnzymesNo results for input(s): TROPONINI in the last 168 hours. No results for input(s): TROPIPOC in the last 168 hours.   BNP Recent Labs  Lab 06/11/18 1113  BNP 1,119.1*    DDimer No results for input(s): DDIMER in the last 168 hours.   Radiology    Dg Chest Port 1 View  Result Date: 06/11/2018 CLINICAL DATA:  Pleural effusion. Hypertension. Diabetes. Prior myocardial infarction. EXAM: PORTABLE CHEST 1 VIEW COMPARISON:  06/10/2018 and 03/27/2018. FINDINGS: Patient rotated to the right. Midline trachea. Mild cardiomegaly. A moderate right pleural effusion with suggestion of  loculation superiorly, similar. Tiny left pleural effusion. No pneumothorax. Similar lower lung predominant right-sided airspace disease. The left lung remains clear. Atherosclerosis in the transverse aorta. Proximal left humerus fracture. IMPRESSION: No significant change since one day prior. Moderate right pleural effusion with suggestion of loculation superiorly. Underlying right-sided airspace disease could represent atelectasis and/or infection. Aortic Atherosclerosis (ICD10-I70.0). Tiny left pleural effusion. Electronically Signed   By: Vanessa Webb M.D.   On: 06/11/2018 08:22    Cardiac Studies   Echocardiogram 06/12/2018: 1. The left ventricle has mild-moderately reduced systolic function, with an ejection fraction of 40-45%. The cavity size was  normal. There is mildly increased left ventricular wall thickness. Left ventricular diastolic Doppler parameters are consistent with impaired relaxation. Left ventricular diffuse hypokinesis.  2. The right ventricle has normal systolc function. Right ventricular systolic pressure is mildly elevated with an estimated pressure of 41.7 mmHg.  3. The mitral valve is degenerative. Moderate thickening of the mitral valve leaflet. Moderate calcification of the mitral valve leaflet. Mitral valve regurgitation is moderate by color flow Doppler.  4. The aortic valve is tricuspid Moderate thickening of the aortic valve Moderate calcification of the aortic valve.  5. The aortic root is normal in size and structure.  Patient Profile   Vanessa Webb is a 73 y.o. female with a history of paroxysmal atrial fibrillation, chronic anticoagulation therapy with Coumadin, ESRD on hemodialysis (T/Th/Sat), chronic anemia, chronic thrombocytopenia, type 2 diabetes mellitus, hypertension, and hyperlipidemia who is being seen today for the evaluation of atrial fibrillation with RVR at the request of Dr. Cruzita Webb (Internal Medicine).  Assessment & Plan    Paroxysmal Atrial Fibrillation with RVR - Telemetry shows atrial fibrillation with improved ventricular rates now in the low 100s - Potassium 3.9 today. Goal >4.0. Supplement as needed. - Magnesium 2.1 yesterday. Goal >2.0. Supplement as needed. - Continue IV Amiodarone. - Continue Lopressor 25mg  twice daily. - Continue chronic anticoagulation with Coumadin per Pharmacy.  Acute Combined CHF - BNP elevated at 1,119.1 - Echo this admission showed LVEF of 40-45% (down from 55-60% in 02/2016) with diffuse hypokinesis. Reduced EF possibly secondary to atrial fibrillation with RVR. - Volume being managed with hemodialysis.  - Continue beta-blocker as above. - Continue to monitor daily weights, strict I/O's, and renal function.  Mitral Regurgitation  - Echo this admission noted  degenerative mitral valve with moderate thickening/calcifications of the mitral valve leaflets and moderate regurgitation by color flow doppler. Also noted to have moderate regurgitation on previous Echo in 02/2016.  Pleural Effusion - Chest x-ray showed pleural effusion with suggestion of loculation superiorly and underlying right-sided airspace disease that could represent atelectasis and/or infection. - Management per primary team.  Hypertension - Currently well controlled. - Continue current medications.  Hyperlipidemia - Continue home Lipitor 20mg  daily.  ESRD on Hemodialysis (T/Th/Sat) - Management per Nephrology.  Chronic Anemia and Thrombocytopenia - Likely secondary to ESRD. - Hemoglobin 10.3 today. Baseline appears to be around the 9 to 10 range. - Platelets 132 today. Baseline appears to be anywhere from 80 to 130 range. - Management per primary team.   For questions or updates, please contact [place] Please consult [email or web address] for contact info under Cardiology/STEMI.      Signed, Darreld Mclean, PA-C  06/12/2018, 7:25 AM   Pager: [phone]

## 2018-06-12 NOTE — Progress Notes (Addendum)
Patient ID: Vanessa Webb, female   DOB: Jul 26, 1945, 73 y.o.   MRN: [id number] [facility] Progress Note   Assessment/ Plan:   1.  Atrial fibrillation with rapid ventricular response: Intermittent RVR noted overnight but has improved on amiodarone drip.    On beta-blocker with metoprolol and anticoagulation received. 2.  End-stage renal disease: Status post hemodialysis yesterday after missing scheduled treatment on Tuesday, earlier this morning had hemodialysis to get her back onto outpatient schedule (suspect this has been limited in the past with RVR/UF). 3.  Hyponatremia: Likely reflective of her inability to restrict intradialytic weight gain-reminded need to limit this to 32 ounces/day. 4.  Anemia: Hemoglobin and hematocrit currently within acceptable range, will continue to follow trend to decide on need for ESA therapy. 5.  Secondary hyperparathyroidism: We will check phosphorus level and reconcile medications for PTH management with the dialysis unit. 6.  Protein calorie malnutrition: Significant hypoalbuminemia noted most possibly arising from nonhealing leg ulcer/acute phase reactant.  Will add nutritional supplement.  Subjective:   Reports to be fatigued this morning, returned from hemodialysis   Objective:   BP 126/71 (BP Location: Right Arm)   Pulse (!) 110   Temp 97.9 F (36.6 C) (Oral)   Resp (!) 22   Ht 5\' 4"  (1.626 m)   Wt 52.9 kg   SpO2 100%   BMI 20.02 kg/m   Physical Exam: Gen: Appears comfortable resting in bed CVS: Pulse irregularly irregular tachycardia, S1 and S2 normal Resp: Clear to auscultation, no rales/rhonchi Abd: Soft, flat, nontender Ext: 1-2+ lower extremity edema, right brachiocephalic fistula with intact dressings  Labs: BMET Recent Labs  Lab 06/11/18 0640 06/12/18 0431  NA 133* 132*  K 4.0 3.9  CL 93* 92*  CO2 23 23  GLUCOSE 127* 102*  BUN 55* 63*  CREATININE 5.91* 6.73*  CALCIUM 8.5* 8.2*   CBC Recent Labs  Lab  06/11/18 0640 06/12/18 0431  WBC 10.9* 10.9*  HGB 10.4* 10.3*  HCT 33.3* 32.8*  MCV 86.7 85.2  PLT 127* 132*    @IMGRELPRIORS @ Medications:    . atorvastatin  20 mg Oral QHS  . Chlorhexidine Gluconate Cloth  6 each Topical Q0600  . collagenase   Topical Daily  . insulin aspart  0-9 Units Subcutaneous TID WC  . metoprolol tartrate  25 mg Oral BID  . Warfarin - Pharmacist Dosing Inpatient   Does not apply [id number]   Elmarie Shiley, MD 06/12/2018, 11:26 AM

## 2018-06-12 NOTE — Progress Notes (Signed)
PROGRESS NOTE  Vanessa Webb ZLD:[id number] DOB: Aug 05, 1945 DOA: 06/11/2018 PCP: Monico Blitz, MD   LOS: 1 day   Brief Narrative / Interim history: 73 year old female with history of ESRD on HD TTS, hypertension, hyperlipidemia, A. fib on Coumadin for anticoagulation and chronic right leg ulcer transferred from [facility] for hemodialysis.  Patient presented to [facility] with 2 weeks of generalized weakness and found to be in A. fib with RVR.  She was placed on diltiazem drip transition to amiodarone prior to transfer. Patient is dialysis on 4/14 due to weakness.  Reports good compliance with her metoprolol.  Subjective: No major events overnight of this morning.  Remains a symptomatic except for weakness.  Denies chest pain, dyspnea, palpitation or lightheadedness.'s right leg ulcer which is improving per patient.  She has no signs or symptoms of infectious process.  Assessment & Plan: Principal Problem:   Atrial fibrillation with RVR (HCC) Active Problems:   End stage renal disease (HCC)   DM (diabetes mellitus) with complications (HCC)   HTN (hypertension)   HLD (hyperlipidemia)   Pressure injury of skin  Paroxysmal atrial fibrillation with RVR: RVR improved.  Heart rate in low 100s this morning.  Echo as below. -Remains on amiodarone drip per cardiology. -Continue home metoprolol and Coumadin.  Pharmacy dosing warfarin. -Closely monitor electrolytes and replenish  Acute on chronic combined CHF: BNP elevated but difficult to interpret in the setting of ESRD.  Likely due to missed dialysis A. Fib. Echo this admission showed LVEF of 40-45% (down from 55-60% in 02/2016) with diffuse hypokinesis. Reduced EF likely due to RVR. -Volume managed by HD  Moderate right pleural effusion with possible loculation superiorly: She denies dyspnea or chest pain.  No symptoms of infectious process.  No significant change from prior chest x-ray in January. -We will obtain repeat chest x-ray in the morning and  discussed with IR if needed. -Meanwhile, manage volume with HD  Hypertension -Manage as above.  ESRD on HD TTS/anemia of chronic disease/secondary hyperparathyroidism -On HD this morning. -Nephrology managing.  Right lower extremity wound: followed by wound care outpatient.  Reportedly improving per patient. -Appreciate wound care input -Outpatient follow-up.  ?DM-2: CBG less than 100 so far.  Does not appear to be medication at home. -Discontinue CBG monitoring  Scheduled Meds: . atorvastatin  20 mg Oral QHS  . Chlorhexidine Gluconate Cloth  6 each Topical Q0600  . collagenase   Topical Daily  . feeding supplement (NEPRO CARB STEADY)  237 mL Oral BID BM  . insulin aspart  0-9 Units Subcutaneous TID WC  . metoprolol tartrate  25 mg Oral BID  . multivitamin  1 tablet Oral QHS  . Warfarin - Pharmacist Dosing Inpatient   Does not apply q1800   Continuous Infusions: . amiodarone 30 mg/hr (06/12/18 1142)   PRN Meds:.acetaminophen **OR** acetaminophen, acetaminophen, antiseptic oral rinse, diphenoxylate-atropine, oxyCODONE  DVT prophylaxis: On Coumadin for atrial fibrillation Code Status: DNR Family Communication: None at bedside Disposition Plan: Remains inpatient  Consultants:   Nephrology  Cardiology  Procedures:   HD on 4/16  Antimicrobials:  None  Objective: Vitals:   06/12/18 1030 06/12/18 1039 06/12/18 1156 06/12/18 1227  BP: 97/61 126/71 (!) 156/93 132/70  Pulse: (!) 118 (!) 110 92   Resp:  (!) 22 (!) 33   Temp:  97.9 F (36.6 C) 98.4 F (36.9 C)   TempSrc:  Oral Oral   SpO2:  100% 93%   Weight:  52.9 kg    Height:  Intake/Output Summary (Last 24 hours) at 06/12/2018 1304 Last data filed at 06/12/2018 1039 Gross per 24 hour  Intake 351.15 ml  Output 1800 ml  Net -1448.85 ml   Filed Weights   06/12/18 0505 06/12/18 0701 06/12/18 1039  Weight: 54.3 kg 54.5 kg 52.9 kg    Examination:  GENERAL: Appears well. No acute distress.  EYES -  vision grossly intact. Sclera anicteric.  NOSE- no gross deformity or drainage MOUTH - no oral lesions noted THROAT- no swelling or erythema LUNGS:  No IWOB.  Diminished aeration over RLL HEART: Heart rate in 100s.  Irregular.  Heart sounds normal. ABD: Bowel sounds present. Soft. Non tender.  MSK/EXT: Moves extremities. No obvious deformity. SKIN: Right lower extremity wound with out signs of infection.  Dressing clean and dry. NEURO: Awake, alert and oriented appropriately.  No gross deficit.  PSYCH: Calm. Normal affect.   Data Reviewed: I have independently reviewed following labs and imaging studies  CBC: Recent Labs  Lab 06/11/18 0640 06/12/18 0431  WBC 10.9* 10.9*  HGB 10.4* 10.3*  HCT 33.3* 32.8*  MCV 86.7 85.2  PLT 127* 284*   Basic Metabolic Panel: Recent Labs  Lab 06/11/18 0640 06/12/18 0431  NA 133* 132*  K 4.0 3.9  CL 93* 92*  CO2 23 23  GLUCOSE 127* 102*  BUN 55* 63*  CREATININE 5.91* 6.73*  CALCIUM 8.5* 8.2*  MG 2.1 2.0   GFR: Estimated Creatinine Clearance: 6.1 mL/min (A) (by C-G formula based on SCr of 6.73 mg/dL (H)). Liver Function Tests: Recent Labs  Lab 06/12/18 0431  AST 42*  ALT 23  ALKPHOS 74  BILITOT 0.9  PROT 5.8*  ALBUMIN 1.8*   No results for input(s): LIPASE, AMYLASE in the last 168 hours. No results for input(s): AMMONIA in the last 168 hours. Coagulation Profile: Recent Labs  Lab 06/11/18 0911 06/12/18 0431  INR 4.0* 4.0*   Cardiac Enzymes: No results for input(s): CKTOTAL, CKMB, CKMBINDEX, TROPONINI in the last 168 hours. BNP (last 3 results) No results for input(s): PROBNP in the last 8760 hours. HbA1C: No results for input(s): HGBA1C in the last 72 hours. CBG: Recent Labs  Lab 06/11/18 0746 06/11/18 1228 06/11/18 1807 06/11/18 2103 06/12/18 1143  GLUCAP 108* 140* 95 91 71   Lipid Profile: No results for input(s): CHOL, HDL, LDLCALC, TRIG, CHOLHDL, LDLDIRECT in the last 72 hours. Thyroid Function Tests:  No results for input(s): TSH, T4TOTAL, FREET4, T3FREE, THYROIDAB in the last 72 hours. Anemia Panel: No results for input(s): VITAMINB12, FOLATE, FERRITIN, TIBC, IRON, RETICCTPCT in the last 72 hours. Urine analysis:    Component Value Date/Time   COLORURINE YELLOW 01/21/2009 1719   APPEARANCEUR CLOUDY (A) 01/21/2009 1719   LABSPEC 1.021 01/21/2009 1719   PHURINE 5.0 01/21/2009 1719   GLUCOSEU NEGATIVE 01/21/2009 1719   HGBUR TRACE (A) 01/21/2009 1719   BILIRUBINUR MODERATE (A) 01/21/2009 1719   KETONESUR 15 (A) 01/21/2009 1719   PROTEINUR 30 (A) 01/21/2009 1719   UROBILINOGEN 1.0 01/21/2009 1719   NITRITE POSITIVE (A) 01/21/2009 1719   LEUKOCYTESUR MODERATE (A) 01/21/2009 1719   Sepsis Labs: Invalid input(s): PROCALCITONIN, LACTICIDVEN  Recent Results (from the past 240 hour(s))  MRSA PCR Screening     Status: None   Collection Time: 06/11/18  6:23 AM  Result Value Ref Range Status   MRSA by PCR NEGATIVE NEGATIVE Final    Comment:        The GeneXpert MRSA Assay (FDA approved for NASAL specimens  only), is one component of a comprehensive MRSA colonization surveillance program. It is not intended to diagnose MRSA infection nor to guide or monitor treatment for MRSA infections. Performed at [facility], [place] [address], [place], [place] [address]       Radiology Studies: No results found.    Jaila Schellhorn T. [facility] Pager [phone]  If 7PM-7AM, please contact night-coverage [email or web address] Password [email or web address] 06/12/2018, 1:04 PM

## 2018-06-12 NOTE — Progress Notes (Signed)
ANTICOAGULATION CONSULT NOTE - Initial Consult  Pharmacy Consult for Coumadin  Indication: atrial fibrillation with RVR, h/o afib on coumadin PTA.  Allergies  Allergen Reactions  . Sulfa Antibiotics Nausea Only    Patient Measurements: Height: 5\' 4"  (162.6 cm) Weight: 120 lb 2.4 oz (54.5 kg) IBW/kg (Calculated) : 54.7 Heparin Dosing Weight:  Vital Signs: Temp: 97.7 F (36.5 C) (04/16 0701) Temp Source: Oral (04/16 0701) BP: 100/62 (04/16 1000) Pulse Rate: 122 (04/16 1000)  Labs: Recent Labs    06/11/18 0640 06/11/18 0911 06/12/18 0431  HGB 10.4*  --  10.3*  HCT 33.3*  --  32.8*  PLT 127*  --  132*  LABPROT  --  38.4* 38.1*  INR  --  4.0* 4.0*  CREATININE 5.91*  --  6.73*    Estimated Creatinine Clearance: 6.3 mL/min (A) (by C-G formula based on SCr of 6.73 mg/dL (H)).   Medical History: Past Medical History:  Diagnosis Date  . Anemia    takes Folic Acid;pt states she gets an injection every 2wks   . Closed right hip fracture, initial encounter ([place]) 10/19/2017  . Depression    but doesn't take any meds for it  . Diabetes mellitus    takes Lantus and Novolog 70/30;fasting blood sugar 200;Type 2  . GERD (gastroesophageal reflux disease)    takes Omeprazole daily  . GI bleeding   . History of bladder infections    last one a month ago;saw Dr.Wrenn and cysto was done in office;was on antibiotics and completed 2wks ago  . History of blood transfusion    no blood transfusion  . HLD (hyperlipidemia) 10/19/2017  . HTN (hypertension) 10/19/2017  . Hyperlipidemia    takes Zocor daily  . Hypertension    takes Coreg daily  . Myocardial infarction ([place]) 2010  . Pancreatitis   . Peripheral neuropathy   . UTI (lower urinary tract infection)     Medications:  Medications Prior to Admission  Medication Sig Dispense Refill Last Dose  . acetaminophen (TYLENOL) 500 MG tablet Take 500 mg by mouth every 6 (six) hours as needed (for pain.).   Past Month at Unknown time   . amLODipine (NORVASC) 2.5 MG tablet TAKE ONE TABLET BY MOUTH ON NON DIALYSIS DAYS. (MONDAY, WEDNESDAY, FRIDAY & SUNDAY) (Patient taking differently: Take 2.5 mg by mouth See admin instructions. TAKE ONE TABLET BY MOUTH ON NON DIALYSIS DAYS. (MONDAY, WEDNESDAY, FRIDAY & SUNDAY)) 70 tablet 3 06/09/2018  . atorvastatin (LIPITOR) 20 MG tablet Take 20 mg by mouth at bedtime.   06/09/2018  . lidocaine-prilocaine (EMLA) cream Apply 1 application topically daily as needed (prior to port being accessed).    06/07/2018  . metoprolol tartrate (LOPRESSOR) 25 MG tablet TAKE 1 AND 1/2 TABLETS BY MOUTH TWICE DAILY. (MORNING,BEDTIME) (Patient taking differently: Take 25 mg by mouth 2 (two) times daily. ) 270 tablet 3 06/09/2018 at 8-10am  . oxyCODONE (OXY IR/ROXICODONE) 5 MG immediate release tablet Take 1 tablet (5 mg total) by mouth every 4 (four) hours as needed for severe pain. 20 tablet 0 Past Month at Unknown time  . warfarin (COUMADIN) 2.5 MG tablet Take 1 tablet daily except 1/2 tablet on Saturdays as directed (Patient taking differently: Take 1.25-2.5 mg by mouth See admin instructions. Take 2.5MG  by mouth daily except 1.25MG  on saturdays.) 30 tablet 6 06/09/2018 at 8:30PM    Assessment: 73 y.o female on chronic coumadin PTA for h/o PAF. Coumadin is followed by Dr. Harl Bowie in [facility]  up in [place], [place].   She presented to [facility] hospital for genearlized weakness and she was found to be in A. fib with RVR. Started on IV amiodarone drip.  Transferred to [facility].   Admit  INR = 4.0 , warfarin dose held.  Today's INR remains 4.0, supratherapeutic.   Hgb 10.3 low stable, pltc 132 stable. No bleeding reported. -ESRD- HD TTS, missed HD  Past history of GIB noted, date not reported.   PTA Warfarin 2.5 mg daily except 1.25 mg qSat, LD taken pta 4/13 @ 2030   Goal of Therapy:  INR 2-3 Monitor platelets by anticoagulation protocol: Yes   Plan:  Hold Coumadin today  Daily INR   Thank you for allowing  pharmacy to be part of this patients care team. Nicole Cella, [place] Pharmacist Pager: [phone] Please check AMION for all [place] phone numbers After 10:00 PM, call [place] [phone] 06/12/2018,10:13 AM

## 2018-06-13 ENCOUNTER — Inpatient Hospital Stay (HOSPITAL_COMMUNITY): Payer: Medicare Other

## 2018-06-13 ENCOUNTER — Encounter (HOSPITAL_COMMUNITY): Payer: Self-pay | Admitting: Student

## 2018-06-13 DIAGNOSIS — I5043 Acute on chronic combined systolic (congestive) and diastolic (congestive) heart failure: Secondary | ICD-10-CM

## 2018-06-13 HISTORY — PX: IR THORACENTESIS ASP PLEURAL SPACE W/IMG GUIDE: IMG5380

## 2018-06-13 LAB — PROTIME-INR
INR: 3.7 — ABNORMAL HIGH (ref 0.8–1.2)
Prothrombin Time: 35.8 seconds — ABNORMAL HIGH (ref 11.4–15.2)

## 2018-06-13 LAB — LACTATE DEHYDROGENASE, PLEURAL OR PERITONEAL FLUID: LD, Fluid: 410 U/L — ABNORMAL HIGH (ref 3–23)

## 2018-06-13 LAB — HEPATIC FUNCTION PANEL
ALT: 31 U/L (ref 0–44)
AST: 64 U/L — ABNORMAL HIGH (ref 15–41)
Albumin: 1.7 g/dL — ABNORMAL LOW (ref 3.5–5.0)
Alkaline Phosphatase: 73 U/L (ref 38–126)
Bilirubin, Direct: 0.3 mg/dL — ABNORMAL HIGH (ref 0.0–0.2)
Indirect Bilirubin: 0.4 mg/dL (ref 0.3–0.9)
Total Bilirubin: 0.7 mg/dL (ref 0.3–1.2)
Total Protein: 6.2 g/dL — ABNORMAL LOW (ref 6.5–8.1)

## 2018-06-13 LAB — T4, FREE: Free T4: 1.1 ng/dL (ref 0.82–1.77)

## 2018-06-13 LAB — CBC
HCT: 36 % (ref 36.0–46.0)
Hemoglobin: 11.5 g/dL — ABNORMAL LOW (ref 12.0–15.0)
MCH: 27.3 pg (ref 26.0–34.0)
MCHC: 31.9 g/dL (ref 30.0–36.0)
MCV: 85.5 fL (ref 80.0–100.0)
Platelets: 173 10*3/uL (ref 150–400)
RBC: 4.21 MIL/uL (ref 3.87–5.11)
RDW: 16.9 % — ABNORMAL HIGH (ref 11.5–15.5)
WBC: 15.8 10*3/uL — ABNORMAL HIGH (ref 4.0–10.5)
nRBC: 0 % (ref 0.0–0.2)

## 2018-06-13 LAB — BODY FLUID CELL COUNT WITH DIFFERENTIAL
Eos, Fluid: 0 %
Lymphs, Fluid: 9 %
Monocyte-Macrophage-Serous Fluid: 5 % — ABNORMAL LOW (ref 50–90)
Neutrophil Count, Fluid: 86 % — ABNORMAL HIGH (ref 0–25)
Other Cells, Fluid: 0 %
Total Nucleated Cell Count, Fluid: 4750 cu mm — ABNORMAL HIGH (ref 0–1000)

## 2018-06-13 LAB — GRAM STAIN

## 2018-06-13 LAB — GLUCOSE, CAPILLARY
Glucose-Capillary: 102 mg/dL — ABNORMAL HIGH (ref 70–99)
Glucose-Capillary: 119 mg/dL — ABNORMAL HIGH (ref 70–99)
Glucose-Capillary: 103 mg/dL — ABNORMAL HIGH (ref 70–99)
Glucose-Capillary: 96 mg/dL (ref 70–99)

## 2018-06-13 LAB — TSH: TSH: 3.022 u[IU]/mL (ref 0.350–4.500)

## 2018-06-13 LAB — MAGNESIUM: Magnesium: 1.9 mg/dL (ref 1.7–2.4)

## 2018-06-13 LAB — HEMOGLOBIN A1C
Hgb A1c MFr Bld: 5.9 % — ABNORMAL HIGH (ref 4.8–5.6)
Mean Plasma Glucose: 122.63 mg/dL

## 2018-06-13 LAB — PROTEIN, PLEURAL OR PERITONEAL FLUID: Total protein, fluid: <3 g/dL

## 2018-06-13 LAB — AMYLASE, PLEURAL OR PERITONEAL FLUID: Amylase, Fluid: 22 U/L

## 2018-06-13 LAB — HEPATITIS B CORE ANTIBODY, TOTAL: Hep B Core Total Ab: NEGATIVE

## 2018-06-13 LAB — HEPATITIS B SURFACE ANTIBODY, QUANTITATIVE: Hep B S AB Quant (Post): 6.2 m[IU]/mL — ABNORMAL LOW (ref >9.9)

## 2018-06-13 LAB — LACTATE DEHYDROGENASE: LDH: 146 U/L (ref 98–192)

## 2018-06-13 LAB — HEPATITIS B SURFACE ANTIGEN: Hepatitis B Surface Ag: NEGATIVE

## 2018-06-13 MED ORDER — LIDOCAINE HCL 1 % IJ SOLN
INTRAMUSCULAR | Status: AC | PRN
  Administered 2018-06-13: 10 mL

## 2018-06-13 MED ORDER — LIDOCAINE HCL 1 % IJ SOLN
INTRAMUSCULAR | Status: AC
  Filled 2018-06-13: qty 20

## 2018-06-13 MED ORDER — HEPARIN SODIUM (PORCINE) 1000 UNIT/ML DIALYSIS
40.0000 [IU]/kg | INTRAMUSCULAR | Status: DC | PRN
  Filled 2018-06-13: qty 3

## 2018-06-13 NOTE — Progress Notes (Addendum)
ANTICOAGULATION CONSULT NOTE -   Pharmacy Consult for Coumadin  Indication: atrial fibrillation with RVR, h/o afib on coumadin PTA.  Allergies  Allergen Reactions  . Sulfa Antibiotics Nausea Only    Patient Measurements: Height: 5\' 4"  (162.6 cm) Weight: 116 lb 6.4 oz (52.8 kg) IBW/kg (Calculated) : 54.7 Heparin Dosing Weight:  Vital Signs: Temp: 97.6 F (36.4 C) (04/17 1555) Temp Source: Oral (04/17 1555) BP: 132/64 (04/17 1555) Pulse Rate: 108 (04/17 0934)  Labs: Recent Labs    06/11/18 0640 06/11/18 0911 06/12/18 0431 06/13/18 0401 06/13/18 1809  HGB 10.4*  --  10.3*  --  11.5*  HCT 33.3*  --  32.8*  --  36.0  PLT 127*  --  132*  --  173  LABPROT  --  38.4* 38.1* 35.8*  --   INR  --  4.0* 4.0* 3.7*  --   CREATININE 5.91*  --  6.73* 3.97*  --     Estimated Creatinine Clearance: 10.4 mL/min (A) (by C-G formula based on SCr of 3.97 mg/dL (H)).   Medical History: Past Medical History:  Diagnosis Date  . Anemia    takes Folic Acid;pt states she gets an injection every 2wks   . Closed right hip fracture, initial encounter ([place]) 10/19/2017  . Depression    but doesn't take any meds for it  . Diabetes mellitus    takes Lantus and Novolog 70/30;fasting blood sugar 200;Type 2  . GERD (gastroesophageal reflux disease)    takes Omeprazole daily  . GI bleeding   . History of bladder infections    last one a month ago;saw Dr.Wrenn and cysto was done in office;was on antibiotics and completed 2wks ago  . History of blood transfusion    no blood transfusion  . HLD (hyperlipidemia) 10/19/2017  . HTN (hypertension) 10/19/2017  . Hyperlipidemia    takes Zocor daily  . Hypertension    takes Coreg daily  . Myocardial infarction ([place]) 2010  . Pancreatitis   . Peripheral neuropathy   . UTI (lower urinary tract infection)     Medications:  Medications Prior to Admission  Medication Sig Dispense Refill Last Dose  . acetaminophen (TYLENOL) 500 MG tablet Take 500 mg by  mouth every 6 (six) hours as needed (for pain.).   Past Month at Unknown time  . amLODipine (NORVASC) 2.5 MG tablet TAKE ONE TABLET BY MOUTH ON NON DIALYSIS DAYS. (MONDAY, WEDNESDAY, FRIDAY & SUNDAY) (Patient taking differently: Take 2.5 mg by mouth See admin instructions. TAKE ONE TABLET BY MOUTH ON NON DIALYSIS DAYS. (MONDAY, WEDNESDAY, FRIDAY & SUNDAY)) 70 tablet 3 06/09/2018  . atorvastatin (LIPITOR) 20 MG tablet Take 20 mg by mouth at bedtime.   06/09/2018  . lidocaine-prilocaine (EMLA) cream Apply 1 application topically daily as needed (prior to port being accessed).    06/07/2018  . metoprolol tartrate (LOPRESSOR) 25 MG tablet TAKE 1 AND 1/2 TABLETS BY MOUTH TWICE DAILY. (MORNING,BEDTIME) (Patient taking differently: Take 25 mg by mouth 2 (two) times daily. ) 270 tablet 3 06/09/2018 at 8-10am  . oxyCODONE (OXY IR/ROXICODONE) 5 MG immediate release tablet Take 1 tablet (5 mg total) by mouth every 4 (four) hours as needed for severe pain. 20 tablet 0 Past Month at Unknown time  . warfarin (COUMADIN) 2.5 MG tablet Take 1 tablet daily except 1/2 tablet on Saturdays as directed (Patient taking differently: Take 1.25-2.5 mg by mouth See admin instructions. Take 2.5MG  by mouth daily except 1.25MG  on saturdays.) 30 tablet 6  06/09/2018 at 8:30PM    Assessment: 73 y.o female on chronic coumadin PTA for h/o PAF. Coumadin is followed by Dr. Harl Bowie in [place] clinic up in [place], [place].   She presented to [facility] hospital for genearlized weakness and she was found to be in A. fib with RVR. Started on IV amiodarone drip.  Transferred to [facility] on 06/11/18..   Admit  INR = 4.0 , warfarin dose held.  INR today is 3.7,  remains 4.0, supratherapeutic.   Anemia: H/H low/stable Hgb 10.3>11.5; pltc 127>132 improved to 173k.  S/p therapeutic right thoracentesis today , yielded 300 ml of bloody fluid.  -ESRD- HD TTS, missed HD  Past history of GIB noted, date not reported.   PTA Warfarin 2.5 mg daily except 1.25 mg qSat, LD  taken pta 4/13 @ 2030   Goal of Therapy:  INR 2-3 Monitor platelets by anticoagulation protocol: Yes   Plan:  Hold Coumadin today  Daily INR   Thank you for allowing pharmacy to be part of this patients care team. Nicole Cella, [place] Pharmacist Pager: [phone] Please check AMION for all [place] phone numbers After 10:00 PM, call [place] [phone] 06/13/2018,7:09 PM

## 2018-06-13 NOTE — Procedures (Addendum)
PROCEDURE SUMMARY:  Successful US guided diagnostic and therapeutic right thoracentesis. Yielded 300 mL of bloody fluid.  Fluid was loculated by [place]. Unable to drain all pockets of fluid.  Pt tolerated procedure well. No immediate complications.  Specimen was sent for labs. CXR ordered.  EBL < 5 mL  Docia Barrier PA-C 06/13/2018 4:59 PM

## 2018-06-13 NOTE — Progress Notes (Signed)
PROGRESS NOTE  Vanessa Webb NLG:[id number] DOB: Jul 11, 1945 DOA: 06/11/2018 PCP: Monico Blitz, MD   LOS: 2 days   Brief Narrative / Interim history: 73 year old female with history of ESRD on HD TTS, hypertension, hyperlipidemia, A. fib on Coumadin for anticoagulation and chronic right leg ulcer transferred from [facility] for hemodialysis.  Patient presented to [facility] with 2 weeks of generalized weakness and found to be in A. fib with RVR.  She was placed on diltiazem drip transition to amiodarone prior to transfer. Patient is dialysis on 4/14 due to weakness.  Reports good compliance with her metoprolol.  Patient remained in A. fib with RVR on amiodarone drip.  Subjective: No major events overnight of this morning.  Remains in RVR but no symptoms.  Denies dyspnea, chest pain, palpitation, nausea, vomiting or abdominal pain.  Assessment & Plan: Principal Problem:   Atrial fibrillation with RVR (HCC) Active Problems:   End stage renal disease (HCC)   DM (diabetes mellitus) with complications (HCC)   HTN (hypertension)   HLD (hyperlipidemia)   Pressure injury of skin  Paroxysmal atrial fibrillation with RVR: RVR improved.  Heart rate in low 100s this morning.  Echo as below.  Thyroid panel within normal range. -Remains on amiodarone drip and home metoprolol per cardiology. -DCCV if remains in RVR through the weekend -Pharmacy dosing warfarin.  INR slightly supratherapeutic -Closely monitor electrolytes and replenish  Acute on chronic combined CHF: BNP elevated but difficult to interpret in the setting of ESRD.  Likely due to missed dialysis A. Fib. Echo this admission showed LVEF of 40-45% (down from 55-60% in 02/2016) with diffuse hypokinesis. Reduced EF likely due to RVR.  -Volume managed by HD  Moderate right pleural effusion with possible loculation superiorly: CT chest without contrast showed large loculated right pleural effusion with diffuse irregular right pleural thickening, near  complete right lung atelectasis solid 8 mm LUL pulmonary nodule and possible pulmonary hypertension. -IR thoracocentesis ordered. -Outpatient CT follow-up in 3 to 6 months for the nodule -Meanwhile, manage volume with HD  Hypertension: Normotensive. -Manage as above.  ESRD on HD TTS/anemia of chronic disease/mineral bone disease -On HD this morning. -Nephrology managing.  Right lower extremity wound: followed by wound care outpatient.  Reportedly improving per patient. -Appreciate wound care input -Outpatient follow-up.  ?DM-2: A1c 5.9%.  CBG within normal range. -Discontinued CBG monitoring  Scheduled Meds: . atorvastatin  20 mg Oral QHS  . Chlorhexidine Gluconate Cloth  6 each Topical Q0600  . collagenase   Topical Daily  . feeding supplement (NEPRO CARB STEADY)  237 mL Oral BID BM  . metoprolol tartrate  25 mg Oral BID  . multivitamin  1 tablet Oral QHS  . Warfarin - Pharmacist Dosing Inpatient   Does not apply q1800   Continuous Infusions: . amiodarone 30 mg/hr (06/13/18 0906)   PRN Meds:.acetaminophen **OR** acetaminophen, acetaminophen, antiseptic oral rinse, diphenoxylate-atropine, oxyCODONE  DVT prophylaxis: On Coumadin for atrial fibrillation Code Status: DNR Family Communication: None at bedside.  Attempted to call patient's husband who hung up the phone saying, "I am not good. I do want to talk to you". Attempted to call him back but he picked and hung up again before saying a word.  Disposition Plan: Remains inpatient  Consultants:   Nephrology  Cardiology  Procedures:   HD on 4/16  Antimicrobials:  None  Objective: Vitals:   06/13/18 0420 06/13/18 0754 06/13/18 0934 06/13/18 1141  BP: 134/61 120/78 115/75 124/67  Pulse: (!) 122  Marland Kitchen)  108   Resp: 20     Temp: (!) 97.3 F (36.3 C) 97.6 F (36.4 C)  98.6 F (37 C)  TempSrc: Oral Oral  Oral  SpO2: 97%     Weight: 52.8 kg     Height:        Intake/Output Summary (Last 24 hours) at 06/13/2018  1338 Last data filed at 06/13/2018 0645 Gross per 24 hour  Intake 120 ml  Output -  Net 120 ml   Filed Weights   06/12/18 0701 06/12/18 1039 06/13/18 0420  Weight: 54.5 kg 52.9 kg 52.8 kg    Examination:  GENERAL: Appears well. No acute distress.  EYES - vision grossly intact. Sclera anicteric.  NOSE- no gross deformity or drainage MOUTH - no oral lesions noted THROAT- no swelling or erythema LUNGS:  No IWOB.  Diminished aeration over RLL HEART: Heart rate in 100s.  Irregular.  Heart sounds normal. ABD: Bowel sounds present. Soft. Non tender.  MSK/EXT: Moves extremities. No obvious deformity. SKIN: Right lower extremity wound with out signs of infection.  Dressing clean and dry. NEURO: Awake, alert and oriented appropriately.  No gross deficit.  PSYCH: Calm. Normal affect.   Data Reviewed: I have independently reviewed following labs and imaging studies  CBC: Recent Labs  Lab 06/11/18 0640 06/12/18 0431  WBC 10.9* 10.9*  HGB 10.4* 10.3*  HCT 33.3* 32.8*  MCV 86.7 85.2  PLT 127* 811*   Basic Metabolic Panel: Recent Labs  Lab 06/11/18 0640 06/12/18 0431 06/13/18 0401  NA 133* 132* 136  K 4.0 3.9 4.5  CL 93* 92* 98  CO2 23 23 25   GLUCOSE 127* 102* 109*  BUN 55* 63* 28*  CREATININE 5.91* 6.73* 3.97*  CALCIUM 8.5* 8.2* 8.1*  MG 2.1 2.0 1.9   GFR: Estimated Creatinine Clearance: 10.4 mL/min (A) (by C-G formula based on SCr of 3.97 mg/dL (H)). Liver Function Tests: Recent Labs  Lab 06/12/18 0431  AST 42*  ALT 23  ALKPHOS 74  BILITOT 0.9  PROT 5.8*  ALBUMIN 1.8*   No results for input(s): LIPASE, AMYLASE in the last 168 hours. No results for input(s): AMMONIA in the last 168 hours. Coagulation Profile: Recent Labs  Lab 06/11/18 0911 06/12/18 0431 06/13/18 0401  INR 4.0* 4.0* 3.7*   Cardiac Enzymes: No results for input(s): CKTOTAL, CKMB, CKMBINDEX, TROPONINI in the last 168 hours. BNP (last 3 results) No results for input(s): PROBNP in the last  8760 hours. HbA1C: Recent Labs    06/13/18 0401  HGBA1C 5.9*   CBG: Recent Labs  Lab 06/12/18 1143 06/12/18 1556 06/12/18 2113 06/13/18 0752 06/13/18 1140  GLUCAP 71 106* 107* 102* 119*   Lipid Profile: No results for input(s): CHOL, HDL, LDLCALC, TRIG, CHOLHDL, LDLDIRECT in the last 72 hours. Thyroid Function Tests: Recent Labs    06/13/18 0401  TSH 3.022  FREET4 1.10   Anemia Panel: No results for input(s): VITAMINB12, FOLATE, FERRITIN, TIBC, IRON, RETICCTPCT in the last 72 hours. Urine analysis:    Component Value Date/Time   COLORURINE YELLOW 01/21/2009 1719   APPEARANCEUR CLOUDY (A) 01/21/2009 1719   LABSPEC 1.021 01/21/2009 1719   PHURINE 5.0 01/21/2009 1719   GLUCOSEU NEGATIVE 01/21/2009 1719   HGBUR TRACE (A) 01/21/2009 1719   BILIRUBINUR MODERATE (A) 01/21/2009 1719   KETONESUR 15 (A) 01/21/2009 1719   PROTEINUR 30 (A) 01/21/2009 1719   UROBILINOGEN 1.0 01/21/2009 1719   NITRITE POSITIVE (A) 01/21/2009 1719   LEUKOCYTESUR MODERATE (A) 01/21/2009 1719  Sepsis Labs: Invalid input(s): PROCALCITONIN, LACTICIDVEN  Recent Results (from the past 240 hour(s))  MRSA PCR Screening     Status: None   Collection Time: 06/11/18  6:23 AM  Result Value Ref Range Status   MRSA by PCR NEGATIVE NEGATIVE Final    Comment:        The GeneXpert MRSA Assay (FDA approved for NASAL specimens only), is one component of a comprehensive MRSA colonization surveillance program. It is not intended to diagnose MRSA infection nor to guide or monitor treatment for MRSA infections. Performed at [facility], [place] [address], [place], [place] [address]       Radiology Studies: Ct Chest Wo Contrast  Result Date: 06/13/2018 CLINICAL DATA:  Invasion. End-stage renal disease on hemodialysis. Evaluate pleural effusion. EXAM: CT CHEST WITHOUT CONTRAST TECHNIQUE: Multidetector CT imaging of the chest was performed following the standard protocol without IV contrast.  COMPARISON:  06/11/2018 chest radiograph. FINDINGS: Cardiovascular: Top-normal heart size. No significant pericardial effusion/thickening. Left main and 3 vessel coronary atherosclerosis. Atherosclerotic nonaneurysmal thoracic aorta. Dilated main pulmonary artery (3.8 cm diameter). Mediastinum/Nodes: No discrete thyroid nodules. Unremarkable esophagus. No axillary adenopathy. Right paratracheal adenopathy up to 1.5 cm (series 3/image 57). No discrete hilar adenopathy on this noncontrast scan. Lungs/Pleura: No pneumothorax. Large loculated right pleural effusion with diffuse irregular right pleural thickening. Small dependent left pleural effusion. No appreciable calcified pleural plaques. Near complete right lung atelectasis with mild residual aeration predominantly in the right upper lobe. Mild compressive atelectasis in the dependent left lower lobe. No discrete lung masses. Solid left upper lobe 8 mm pulmonary nodule (series 5/image 53). No additional significant pulmonary nodules. No central airway stenoses. Upper abdomen: Liver surface appears slightly irregular, cannot exclude hepatic cirrhosis. Small volume perihepatic ascites. Musculoskeletal: No aggressive appearing focal osseous lesions. Mild thoracic spondylosis. Extensively comminuted left proximal humerus fracture is partially visualized. IMPRESSION: 1. Large loculated right pleural effusion with diffuse irregular right pleural thickening. Right pleural empyema or neoplasm cannot be excluded. Suggest diagnostic right thoracentesis. 2. Near complete right lung atelectasis. 3. Small dependent left pleural effusion with mild dependent left lung base atelectasis. No left pleural thickening. 4. Solid 8 mm left upper lobe pulmonary nodule. If evaluation of the right pleural effusion reveals neoplastic etiology, suggested attention to this nodule on follow-up chest CT in 3 months. Otherwise, follow-up chest CT in 6 months recommended. 5. Suggestion of slight  liver surface irregularity, cannot exclude appendix cirrhosis. Small volume perihepatic ascites. 6. Dilated main pulmonary artery, suggesting pulmonary arterial hypertension. 7. Nonspecific mild right paratracheal lymphadenopathy. If evaluation of the right pleural effusion reveals neoplastic etiology, recommend attention on follow-up chest CT with IV contrast in 3 months. 8. Left main and 3 vessel coronary atherosclerosis. Aortic Atherosclerosis (ICD10-I70.0). Electronically Signed   By: Ilona Sorrel M.D.   On: 06/13/2018 09:38    Ailany Koren [facility] Pager [phone]  If 7PM-7AM, please contact night-coverage [email or web address] Password [facility] 06/13/2018, 1:38 PM

## 2018-06-13 NOTE — Progress Notes (Signed)
Patient ID: Vanessa Webb, female   DOB: Mar 15, 1945, 73 y.o.   MRN: [id number] [facility] Progress Note   Assessment/ Plan:   1.  Atrial fibrillation with rapid ventricular response: Intermittent RVR noted overnight but has improved on amiodarone drip.    On beta-blocker with metoprolol and anticoagulation with warfarin. 2.  End-stage renal disease: I will order for hemodialysis again tomorrow to continue her usual outpatient TTS schedule.  I agree with the utility of a thoracentesis in alleviating her right pleural effusion as this is unlikely to be treated with hemodialysis.  No acute indication for dialysis today. 3.  Hyponatremia: Likely reflective of her inability to restrict intradialytic weight gain-reminded need to limit this to 32 ounces/day. 4.  Anemia: Hemoglobin and hematocrit currently within acceptable range, will continue to follow trend to decide on need for ESA therapy. 5.  Secondary hyperparathyroidism: We will follow calcium/phosphorus levels. 6.  Protein calorie malnutrition: Significant hypoalbuminemia noted most possibly arising from nonhealing leg ulcer/acute phase reactant.  Continue renal vitamin/ONS.  Subjective:   Slept poorly last night and feeling fatigued this morning.  Still having shortness of breath with activity.   Objective:   BP 124/67 (BP Location: Left Arm)   Pulse (!) 108   Temp 98.6 F (37 C) (Oral)   Resp 20   Ht 5\' 4"  (1.626 m)   Wt 52.8 kg   SpO2 97%   BMI 19.98 kg/m   Physical Exam: Gen: Appears comfortable resting in bed CVS: Pulse irregularly irregular, normal rate (96 when seen), S1 and S2 normal Resp: Decreased breath sounds bibasally without distinct rales. Abd: Soft, flat, nontender Ext: 1-2+ lower extremity edema, right brachiocephalic fistula with intact dressings  Labs: BMET Recent Labs  Lab 06/11/18 0640 06/12/18 0431 06/13/18 0401  NA 133* 132* 136  K 4.0 3.9 4.5  CL 93* 92* 98  CO2 23 23 25   GLUCOSE  127* 102* 109*  BUN 55* 63* 28*  CREATININE 5.91* 6.73* 3.97*  CALCIUM 8.5* 8.2* 8.1*   CBC Recent Labs  Lab 06/11/18 0640 06/12/18 0431  WBC 10.9* 10.9*  HGB 10.4* 10.3*  HCT 33.3* 32.8*  MCV 86.7 85.2  PLT 127* 132*   Medications:    . atorvastatin  20 mg Oral QHS  . Chlorhexidine Gluconate Cloth  6 each Topical Q0600  . collagenase   Topical Daily  . feeding supplement (NEPRO CARB STEADY)  237 mL Oral BID BM  . metoprolol tartrate  25 mg Oral BID  . multivitamin  1 tablet Oral QHS  . Warfarin - Pharmacist Dosing Inpatient   Does not apply U1103   Elmarie Shiley, MD 06/13/2018, 11:44 AM

## 2018-06-13 NOTE — Care Management Important Message (Signed)
Important Message  Patient Details  Name: Vanessa Webb MRN: [id number] Date of Birth: Feb 23, 1946   Medicare Important Message Given:  Yes    Vanessa Webb 06/13/2018, 3:57 PM

## 2018-06-13 NOTE — Progress Notes (Signed)
Progress Note  Patient Name: Vanessa Webb Date of Encounter: 06/13/2018  Primary Cardiologist: Carlyle Dolly, MD   Subjective   The patient denies any chest pain or SOB. She doesn't see any improvement yet but would like to go home.  Inpatient Medications    Scheduled Meds:  atorvastatin  20 mg Oral QHS   Chlorhexidine Gluconate Cloth  6 each Topical Q0600   collagenase   Topical Daily   feeding supplement (NEPRO CARB STEADY)  237 mL Oral BID BM   metoprolol tartrate  25 mg Oral BID   multivitamin  1 tablet Oral QHS   Warfarin - Pharmacist Dosing Inpatient   Does not apply q1800   Continuous Infusions:  amiodarone 30 mg/hr (06/13/18 0906)   PRN Meds: acetaminophen **OR** acetaminophen, acetaminophen, antiseptic oral rinse, diphenoxylate-atropine, oxyCODONE   Vital Signs    Vitals:   06/12/18 2321 06/13/18 0420 06/13/18 0754 06/13/18 0934  BP: 120/68 134/61 120/78 115/75  Pulse: (!) 102 (!) 122  (!) 108  Resp: 20 20    Temp: 98.5 F (36.9 C) (!) 97.3 F (36.3 C) 97.6 F (36.4 C)   TempSrc: Oral Oral Oral   SpO2: 96% 97%    Weight:  52.8 kg    Height:        Intake/Output Summary (Last 24 hours) at 06/13/2018 0946 Last data filed at 06/13/2018 0645 Gross per 24 hour  Intake 432.8 ml  Output 1800 ml  Net -1367.2 ml   Filed Weights   06/12/18 0701 06/12/18 1039 06/13/18 0420  Weight: 54.5 kg 52.9 kg 52.8 kg   Telemetry    Atrial fibrillation with RVR- Personally Reviewed  ECG    No new tracing- Personally Reviewed  Physical Exam   Physical Exam per MD:  GEN: Caucasian female resting comfortably. Alert and in no acute distress.   Neck: Supple. No JVD. Cardiac: RRR. No murmurs, gallops, or rubs.  Respiratory: Clear to auscultation bilaterally. No wheezes, rhonchi, or rales. GI: Abdomen soft, non-distended, and non-tender. Bowel sounds present. Extremities: No lower extremity edema. No deformity. Radial and distal pedal pulses 2+ and  equal bilaterally. Skin: Warm and dry. Neuro:  No focal deficits. Psych: Normal affect. Responds appropriately.  Labs    Chemistry Recent Labs  Lab 06/11/18 0640 06/12/18 0431 06/13/18 0401  NA 133* 132* 136  K 4.0 3.9 4.5  CL 93* 92* 98  CO2 23 23 25   GLUCOSE 127* 102* 109*  BUN 55* 63* 28*  CREATININE 5.91* 6.73* 3.97*  CALCIUM 8.5* 8.2* 8.1*  PROT  --  5.8*  --   ALBUMIN  --  1.8*  --   AST  --  42*  --   ALT  --  23  --   ALKPHOS  --  74  --   BILITOT  --  0.9  --   GFRNONAA 6* 6* 10*  GFRAA 8* 6* 12*  ANIONGAP 17* 17* 13     Hematology Recent Labs  Lab 06/11/18 0640 06/12/18 0431  WBC 10.9* 10.9*  RBC 3.84* 3.85*  HGB 10.4* 10.3*  HCT 33.3* 32.8*  MCV 86.7 85.2  MCH 27.1 26.8  MCHC 31.2 31.4  RDW 16.6* 16.5*  PLT 127* 132*   Cardiac EnzymesNo results for input(s): TROPONINI in the last 168 hours. No results for input(s): TROPIPOC in the last 168 hours.   BNP Recent Labs  Lab 06/11/18 1113  BNP 1,119.1*    DDimer No results for input(s): DDIMER  in the last 168 hours.   Radiology    Ct Chest Wo Contrast  Result Date: 06/13/2018 CLINICAL DATA:  Invasion. End-stage renal disease on hemodialysis. Evaluate pleural effusion. EXAM: CT CHEST WITHOUT CONTRAST TECHNIQUE: Multidetector CT imaging of the chest was performed following the standard protocol without IV contrast. COMPARISON:  06/11/2018 chest radiograph. FINDINGS: Cardiovascular: Top-normal heart size. No significant pericardial effusion/thickening. Left main and 3 vessel coronary atherosclerosis. Atherosclerotic nonaneurysmal thoracic aorta. Dilated main pulmonary artery (3.8 cm diameter). Mediastinum/Nodes: No discrete thyroid nodules. Unremarkable esophagus. No axillary adenopathy. Right paratracheal adenopathy up to 1.5 cm (series 3/image 57). No discrete hilar adenopathy on this noncontrast scan. Lungs/Pleura: No pneumothorax. Large loculated right pleural effusion with diffuse irregular right  pleural thickening. Small dependent left pleural effusion. No appreciable calcified pleural plaques. Near complete right lung atelectasis with mild residual aeration predominantly in the right upper lobe. Mild compressive atelectasis in the dependent left lower lobe. No discrete lung masses. Solid left upper lobe 8 mm pulmonary nodule (series 5/image 53). No additional significant pulmonary nodules. No central airway stenoses. Upper abdomen: Liver surface appears slightly irregular, cannot exclude hepatic cirrhosis. Small volume perihepatic ascites. Musculoskeletal: No aggressive appearing focal osseous lesions. Mild thoracic spondylosis. Extensively comminuted left proximal humerus fracture is partially visualized. IMPRESSION: 1. Large loculated right pleural effusion with diffuse irregular right pleural thickening. Right pleural empyema or neoplasm cannot be excluded. Suggest diagnostic right thoracentesis. 2. Near complete right lung atelectasis. 3. Small dependent left pleural effusion with mild dependent left lung base atelectasis. No left pleural thickening. 4. Solid 8 mm left upper lobe pulmonary nodule. If evaluation of the right pleural effusion reveals neoplastic etiology, suggested attention to this nodule on follow-up chest CT in 3 months. Otherwise, follow-up chest CT in 6 months recommended. 5. Suggestion of slight liver surface irregularity, cannot exclude appendix cirrhosis. Small volume perihepatic ascites. 6. Dilated main pulmonary artery, suggesting pulmonary arterial hypertension. 7. Nonspecific mild right paratracheal lymphadenopathy. If evaluation of the right pleural effusion reveals neoplastic etiology, recommend attention on follow-up chest CT with IV contrast in 3 months. 8. Left main and 3 vessel coronary atherosclerosis. Aortic Atherosclerosis (ICD10-I70.0). Electronically Signed   By: Ilona Sorrel M.D.   On: 06/13/2018 09:38    Cardiac Studies   Echocardiogram 06/12/2018: 1. The  left ventricle has mild-moderately reduced systolic function, with an ejection fraction of 40-45%. The cavity size was normal. There is mildly increased left ventricular wall thickness. Left ventricular diastolic Doppler parameters are consistent with impaired relaxation. Left ventricular diffuse hypokinesis.  2. The right ventricle has normal systolc function. Right ventricular systolic pressure is mildly elevated with an estimated pressure of 41.7 mmHg.  3. The mitral valve is degenerative. Moderate thickening of the mitral valve leaflet. Moderate calcification of the mitral valve leaflet. Mitral valve regurgitation is moderate by color flow Doppler.  4. The aortic valve is tricuspid Moderate thickening of the aortic valve Moderate calcification of the aortic valve.  5. The aortic root is normal in size and structure.  Patient Profile   Vanessa Webb is a 73 y.o. female with a history of paroxysmal atrial fibrillation, chronic anticoagulation therapy with Coumadin, ESRD on hemodialysis (T/Th/Sat), chronic anemia, chronic thrombocytopenia, type 2 diabetes mellitus, hypertension, and hyperlipidemia who is being seen today for the evaluation of atrial fibrillation with RVR at the request of Dr. Cruzita Lederer (Internal Medicine).  Assessment & Plan    Paroxysmal Atrial Fibrillation with RVR - Telemetry shows atrial fibrillation with improved ventricular rates now  in the low 100s - Potassium 3.9 today. Goal >4.0. Supplement as needed. - Magnesium 2.1 yesterday. Goal >2.0. Supplement as needed. - Continue IV Amiodarone. - Continue Lopressor 25mg  twice daily. - Continue chronic anticoagulation with Coumadin per Pharmacy. - if no cardioversion over the weekend arrange for DCCV on Monday  Acute Combined CHF - BNP elevated at 1,119.1 - Echo this admission showed LVEF of 40-45% (down from 55-60% in 02/2016) with diffuse hypokinesis. Reduced EF possibly secondary to atrial fibrillation with RVR. - Volume being  managed with hemodialysis.  - she is still volume overloaded - she might benefit from thoracentesis as pleural effusion seems to be loculated and not improving on physical exam after HD - Continue beta-blocker as above. - Continue to monitor daily weights, strict I/O's, and renal function.  Mitral Regurgitation  - Echo this admission noted degenerative mitral valve with moderate thickening/calcifications of the mitral valve leaflets and moderate regurgitation by color flow doppler. Also noted to have moderate regurgitation on previous Echo in 02/2016.  Pleural Effusion - Chest x-ray showed pleural effusion with suggestion of loculation superiorly and underlying right-sided airspace disease that could represent atelectasis and/or infection. - Management per primary team.  Hypertension - Currently well controlled. - Continue current medications.  Hyperlipidemia - Continue home Lipitor 20mg  daily.  ESRD on Hemodialysis (T/Th/Sat) - Management per Nephrology.  Chronic Anemia and Thrombocytopenia - Likely secondary to ESRD. - Hemoglobin 10.3 today. Baseline appears to be around the 9 to 10 range. - Platelets 132 today. Baseline appears to be anywhere from 80 to 130 range. - Management per primary team.   For questions or updates, please contact [place] Please consult [email or web address] for contact info under Cardiology/STEMI.      Signed, Ena Dawley, MD  06/13/2018, 9:46 AM   Pager: [phone]

## 2018-06-14 DIAGNOSIS — I340 Nonrheumatic mitral (valve) insufficiency: Secondary | ICD-10-CM | POA: Diagnosis present

## 2018-06-14 DIAGNOSIS — D72825 Bandemia: Secondary | ICD-10-CM

## 2018-06-14 LAB — RENAL FUNCTION PANEL
Albumin: 1.5 g/dL — ABNORMAL LOW (ref 3.5–5.0)
Anion gap: 17 — ABNORMAL HIGH (ref 5–15)
BUN: 36 mg/dL — ABNORMAL HIGH (ref 8–23)
CO2: 24 mmol/L (ref 22–32)
Calcium: 8.1 mg/dL — ABNORMAL LOW (ref 8.9–10.3)
Chloride: 95 mmol/L — ABNORMAL LOW (ref 98–111)
Creatinine, Ser: 4.81 mg/dL — ABNORMAL HIGH (ref 0.44–1.00)
GFR calc Af Amer: 10 mL/min — ABNORMAL LOW (ref >60)
GFR calc non Af Amer: 8 mL/min — ABNORMAL LOW (ref >60)
Glucose, Bld: 105 mg/dL — ABNORMAL HIGH (ref 70–99)
Phosphorus: 4.1 mg/dL (ref 2.5–4.6)
Potassium: 4.2 mmol/L (ref 3.5–5.1)
Sodium: 136 mmol/L (ref 135–145)

## 2018-06-14 LAB — PROTIME-INR
INR: 4 — ABNORMAL HIGH (ref 0.8–1.2)
Prothrombin Time: 38.4 seconds — ABNORMAL HIGH (ref 11.4–15.2)

## 2018-06-14 LAB — CBC
HCT: 30.6 % — ABNORMAL LOW (ref 36.0–46.0)
Hemoglobin: 9.6 g/dL — ABNORMAL LOW (ref 12.0–15.0)
MCH: 26.7 pg (ref 26.0–34.0)
MCHC: 31.4 g/dL (ref 30.0–36.0)
MCV: 85.2 fL (ref 80.0–100.0)
Platelets: 163 10*3/uL (ref 150–400)
RBC: 3.59 MIL/uL — ABNORMAL LOW (ref 3.87–5.11)
RDW: 16.9 % — ABNORMAL HIGH (ref 11.5–15.5)
WBC: 17.2 10*3/uL — ABNORMAL HIGH (ref 4.0–10.5)
nRBC: 0 % (ref 0.0–0.2)

## 2018-06-14 LAB — ACID FAST SMEAR (AFB, MYCOBACTERIA): Acid Fast Smear: NEGATIVE

## 2018-06-14 LAB — BASIC METABOLIC PANEL
Anion gap: 13 (ref 5–15)
BUN: 28 mg/dL — ABNORMAL HIGH (ref 8–23)
CO2: 25 mmol/L (ref 22–32)
Calcium: 8.1 mg/dL — ABNORMAL LOW (ref 8.9–10.3)
Chloride: 98 mmol/L (ref 98–111)
Creatinine, Ser: 3.97 mg/dL — ABNORMAL HIGH (ref 0.44–1.00)
GFR calc Af Amer: 12 mL/min — ABNORMAL LOW (ref >60)
GFR calc non Af Amer: 10 mL/min — ABNORMAL LOW (ref >60)
Glucose, Bld: 109 mg/dL — ABNORMAL HIGH (ref 70–99)
Potassium: 4.5 mmol/L (ref 3.5–5.1)
Sodium: 136 mmol/L (ref 135–145)

## 2018-06-14 LAB — MAGNESIUM: Magnesium: 2 mg/dL (ref 1.7–2.4)

## 2018-06-14 LAB — GLUCOSE, CAPILLARY: Glucose-Capillary: 98 mg/dL (ref 70–99)

## 2018-06-14 MED ORDER — SODIUM CHLORIDE 0.9 % IV SOLN
2.0000 g | INTRAVENOUS | Status: DC
  Administered 2018-06-14: 18:00:00 2 g via INTRAVENOUS
  Filled 2018-06-14 (×2): qty 20

## 2018-06-14 NOTE — Procedures (Signed)
Patient seen on Hemodialysis. BP (!) (P) 100/51   Pulse (!) (P) 104   Temp (!) 97.5 F (36.4 C) (Axillary)   Resp (!) 24   Ht 5\' 4"  (1.626 m)   Wt 52 kg   SpO2 99%   BMI 19.68 kg/m   QB 400, UF goal 1.5L Tolerating treatment without complaints at this time.   Elmarie Shiley MD [facility]. Office # [phone] Pager # [phone] 10:59 AM

## 2018-06-14 NOTE — Progress Notes (Signed)
ANTICOAGULATION CONSULT NOTE -   Pharmacy Consult for Coumadin  Indication: atrial fibrillation with RVR, h/o afib on coumadin PTA.  Allergies  Allergen Reactions  . Sulfa Antibiotics Nausea Only    Patient Measurements: Height: 5\' 4"  (162.6 cm) Weight: 114 lb 10.2 oz (52 kg) IBW/kg (Calculated) : 54.7 Heparin Dosing Weight:  Vital Signs: Temp: 97.5 F (36.4 C) (04/18 0710) Temp Source: Axillary (04/18 0710) BP: (P) 100/51 (04/18 1030) Pulse Rate: (P) 104 (04/18 1030)  Labs: Recent Labs    06/12/18 0431 06/13/18 0401 06/13/18 1809 06/14/18 0736 06/14/18 0921  HGB 10.3*  --  11.5* 9.6*  --   HCT 32.8*  --  36.0 30.6*  --   PLT 132*  --  173 163  --   LABPROT 38.1* 35.8*  --   --  38.4*  INR 4.0* 3.7*  --   --  4.0*  CREATININE 6.73* 3.97*  --  4.81*  --     Estimated Creatinine Clearance: 8.4 mL/min (A) (by C-G formula based on SCr of 4.81 mg/dL (H)).   Medical History: Past Medical History:  Diagnosis Date  . Anemia    takes Folic Acid;pt states she gets an injection every 2wks   . Closed right hip fracture, initial encounter ([place]) 10/19/2017  . Depression    but doesn't take any meds for it  . Diabetes mellitus    takes Lantus and Novolog 70/30;fasting blood sugar 200;Type 2  . GERD (gastroesophageal reflux disease)    takes Omeprazole daily  . GI bleeding   . History of bladder infections    last one a month ago;saw Dr.Wrenn and cysto was done in office;was on antibiotics and completed 2wks ago  . History of blood transfusion    no blood transfusion  . HLD (hyperlipidemia) 10/19/2017  . HTN (hypertension) 10/19/2017  . Hyperlipidemia    takes Zocor daily  . Hypertension    takes Coreg daily  . Myocardial infarction ([place]) 2010  . Pancreatitis   . Peripheral neuropathy   . UTI (lower urinary tract infection)     Medications:  Medications Prior to Admission  Medication Sig Dispense Refill Last Dose  . acetaminophen (TYLENOL) 500 MG tablet Take 500  mg by mouth every 6 (six) hours as needed (for pain.).   Past Month at Unknown time  . amLODipine (NORVASC) 2.5 MG tablet TAKE ONE TABLET BY MOUTH ON NON DIALYSIS DAYS. (MONDAY, WEDNESDAY, FRIDAY & SUNDAY) (Patient taking differently: Take 2.5 mg by mouth See admin instructions. TAKE ONE TABLET BY MOUTH ON NON DIALYSIS DAYS. (MONDAY, WEDNESDAY, FRIDAY & SUNDAY)) 70 tablet 3 06/09/2018  . atorvastatin (LIPITOR) 20 MG tablet Take 20 mg by mouth at bedtime.   06/09/2018  . lidocaine-prilocaine (EMLA) cream Apply 1 application topically daily as needed (prior to port being accessed).    06/07/2018  . metoprolol tartrate (LOPRESSOR) 25 MG tablet TAKE 1 AND 1/2 TABLETS BY MOUTH TWICE DAILY. (MORNING,BEDTIME) (Patient taking differently: Take 25 mg by mouth 2 (two) times daily. ) 270 tablet 3 06/09/2018 at 8-10am  . oxyCODONE (OXY IR/ROXICODONE) 5 MG immediate release tablet Take 1 tablet (5 mg total) by mouth every 4 (four) hours as needed for severe pain. 20 tablet 0 Past Month at Unknown time  . warfarin (COUMADIN) 2.5 MG tablet Take 1 tablet daily except 1/2 tablet on Saturdays as directed (Patient taking differently: Take 1.25-2.5 mg by mouth See admin instructions. Take 2.5MG  by mouth daily except 1.25MG  on saturdays.) 30  tablet 6 06/09/2018 at 8:30PM    Assessment: 73 y.o female on chronic coumadin PTA for h/o PAF. Coumadin is followed by Dr. Harl Bowie in [facility] up in [place], [place].   She presented to [facility] hospital for genearlized weakness and she was found to be in A. fib with RVR. Started on IV amiodarone drip.  Transferred to [facility] on 06/11/18..   Admit  INR = 4.0 , warfarin dose held.  INR remains 4.0, supratherapeutic.   Anemia: H/H low/stable Hgb 10.3>11.5>9.6; pltc 127>132 improved to 163k.  .  -ESRD- HD TTS, missed HD  Past history of GIB noted, date not reported.   PTA Warfarin 2.5 mg daily except 1.25 mg qSat, LD taken pta 4/13 @ 2030   Goal of Therapy:  INR 2-3 Monitor platelets by  anticoagulation protocol: Yes   Plan:  Hold Coumadin today  Daily INR   Ashlon Lottman A. Levada Dy, PharmD, [place] Please utilize Amion for appropriate phone number to reach the unit pharmacist ([place])   06/14/2018,11:44 AM

## 2018-06-14 NOTE — Progress Notes (Signed)
PROGRESS NOTE  Vanessa Webb:[id number] DOB: Jun 11, 1945 DOA: 06/11/2018 PCP: Monico Blitz, MD   LOS: 3 days   Brief Narrative / Interim history: 73 year old female with history of ESRD on HD TTS, hypertension, hyperlipidemia, A. fib on Coumadin for anticoagulation and chronic right leg ulcer transferred from [facility] for hemodialysis.  Patient presented to [facility] with 2 weeks of generalized weakness and found to be in A. fib with RVR.  She was placed on diltiazem drip transition to amiodarone prior to transfer. Patient is dialysis on 4/14 due to weakness.  Reports good compliance with her metoprolol.  Patient remained in A. fib with RVR on amiodarone drip.   She had US-guided diagnostic and therapeutic right thoracocentesis that yielded 300 cc exudative fluid by LDH.  Pleural fluid grew stain, culture and AFB smear negative.  AFB culture and cytology pending.  Subjective: No major events overnight of this morning.  No complaints this morning.  She remains in RVR on amiodarone drip.  Denies chest pain, dyspnea or abdominal pain.  Assessment & Plan: Principal Problem:   Atrial fibrillation with RVR (HCC) Active Problems:   End stage renal disease (HCC)   DM (diabetes mellitus) with complications (HCC)   HTN (hypertension)   HLD (hyperlipidemia)   Pressure injury of skin  Paroxysmal atrial fibrillation with RVR: RVR improved.  Heart rate in low 100s this morning.  Echo as below.  Thyroid panel within normal range. -Remains on amiodarone drip and home metoprolol per cardiology. -DCCV if remains in RVR through the weekend -Pharmacy dosing warfarin.  INR supratherapeutic -Closely monitor electrolytes and replenish  Acute on chronic combined CHF: BNP elevated but difficult to interpret in the setting of ESRD.  Likely due to missed dialysis A. Fib. Echo this admission showed LVEF of 40-45% (down from 55-60% in 02/2016) with diffuse hypokinesis. Reduced EF likely due to RVR.  -Volume managed  by HD  Moderate right pleural effusion-improved after thoracocentesis that yielded 300 cc exudative fluid.  Gram stain, AFB smears and cultures negative.  AFB culture and cytology pending.  -Follow fluid culture and cytology. -Manage volume with HD  Leukocytosis: ?Stress-induced.  No fever.  She could have some occult intrathoracic infection.  She also have right lower extremity wound that appears to be healing. -If no improvement, will start empiric treatment with antibiotics.  Pulmonary nodule: 8 mm LUL pulmonary nodule noted on CT chest. -Outpatient CT follow-up in 3 to 6 months for the nodule  Hypertension: Normotensive. -Manage as above.  ESRD on HD TTS/anemia of chronic disease/mineral bone disease -On HD this morning. -Nephrology managing.  Right lower extremity wound: followed by wound care outpatient.  Reportedly improving per patient. -Appreciate wound care input -Outpatient follow-up.  ?DM-2: A1c 5.9%.  CBG within normal range. -Discontinued CBG monitoring  Scheduled Meds:  atorvastatin  20 mg Oral QHS   Chlorhexidine Gluconate Cloth  6 each Topical Q0600   collagenase   Topical Daily   feeding supplement (NEPRO CARB STEADY)  237 mL Oral BID BM   metoprolol tartrate  25 mg Oral BID   multivitamin  1 tablet Oral QHS   Warfarin - Pharmacist Dosing Inpatient   Does not apply q1800   Continuous Infusions:  amiodarone 30 mg/hr (06/14/18 0537)   PRN Meds:.acetaminophen **OR** acetaminophen, acetaminophen, antiseptic oral rinse, diphenoxylate-atropine, lidocaine, oxyCODONE  DVT prophylaxis: On Coumadin for atrial fibrillation Code Status: DNR Family Communication: None at bedside.   On 4/17: Attempted to call patient's husband who hung up the  phone saying, "I am not good. I do want to talk to you". Attempted to call him back but he picked and hung up again before saying a word.  Disposition Plan: Remains inpatient  Consultants:    Nephrology  Cardiology  Procedures:   HD on 4/16  IR thoracocentesis on 4/17  Antimicrobials:  None  Objective: Vitals:   06/14/18 1030 06/14/18 1054 06/14/18 1133 06/14/18 1225  BP: (!) 100/51 127/67  120/68  Pulse: (!) 104 (!) 113  (!) 114  Resp:  (!) 21    Temp:  98.3 F (36.8 C)    TempSrc:  Oral    SpO2:  99%    Weight:   51.5 kg   Height:        Intake/Output Summary (Last 24 hours) at 06/14/2018 1336 Last data filed at 06/14/2018 1054 Gross per 24 hour  Intake --  Output 272 ml  Net -272 ml   Filed Weights   06/14/18 0422 06/14/18 0710 06/14/18 1133  Weight: 52.8 kg 52 kg 51.5 kg    Examination: GENERAL: Appears well. No acute distress.  HEENT: MMM.  Vision and Hearing grossly intact.  NECK: Supple.  No JVD.  LUNGS:  No IWOB.  Diminished aeration over RLL. HEART: Heart rate in 100s.  Irregular.  Heart sounds normal.  ABD: Bowel sounds present. Soft. Non tender.  EXT:  no edema bilaterally.  RLE wound without signs of infection.  Dressing clean and dry. SKIN: no apparent skin lesion.  NEURO: Awake, alert and oriented appropriately.  No gross deficit.  PSYCH: Calm. Normal affect.  Data Reviewed: I have independently reviewed following labs and imaging studies  CBC: Recent Labs  Lab 06/11/18 0640 06/12/18 0431 06/13/18 1809 06/14/18 0736  WBC 10.9* 10.9* 15.8* 17.2*  HGB 10.4* 10.3* 11.5* 9.6*  HCT 33.3* 32.8* 36.0 30.6*  MCV 86.7 85.2 85.5 85.2  PLT 127* 132* 173 518   Basic Metabolic Panel: Recent Labs  Lab 06/11/18 0640 06/12/18 0431 06/13/18 0401 06/14/18 0736  NA 133* 132* 136 136  K 4.0 3.9 4.5 4.2  CL 93* 92* 98 95*  CO2 23 23 25 24   GLUCOSE 127* 102* 109* 105*  BUN 55* 63* 28* 36*  CREATININE 5.91* 6.73* 3.97* 4.81*  CALCIUM 8.5* 8.2* 8.1* 8.1*  MG 2.1 2.0 1.9 2.0  PHOS  --   --   --  4.1   GFR: Estimated Creatinine Clearance: 8.3 mL/min (A) (by C-G formula based on SCr of 4.81 mg/dL (H)). Liver Function  Tests: Recent Labs  Lab 06/12/18 0431 06/13/18 2041 06/14/18 0736  AST 42* 64*  --   ALT 23 31  --   ALKPHOS 74 73  --   BILITOT 0.9 0.7  --   PROT 5.8* 6.2*  --   ALBUMIN 1.8* 1.7* 1.5*   No results for input(s): LIPASE, AMYLASE in the last 168 hours. No results for input(s): AMMONIA in the last 168 hours. Coagulation Profile: Recent Labs  Lab 06/11/18 0911 06/12/18 0431 06/13/18 0401 06/14/18 0921  INR 4.0* 4.0* 3.7* 4.0*   Cardiac Enzymes: No results for input(s): CKTOTAL, CKMB, CKMBINDEX, TROPONINI in the last 168 hours. BNP (last 3 results) No results for input(s): PROBNP in the last 8760 hours. HbA1C: Recent Labs    06/13/18 0401  HGBA1C 5.9*   CBG: Recent Labs  Lab 06/12/18 2113 06/13/18 0752 06/13/18 1140 06/13/18 1900 06/13/18 2119  GLUCAP 107* 102* 119* 103* 96   Lipid Profile: No results  for input(s): CHOL, HDL, LDLCALC, TRIG, CHOLHDL, LDLDIRECT in the last 72 hours. Thyroid Function Tests: Recent Labs    06/13/18 0401  TSH 3.022  FREET4 1.10   Anemia Panel: No results for input(s): VITAMINB12, FOLATE, FERRITIN, TIBC, IRON, RETICCTPCT in the last 72 hours. Urine analysis:    Component Value Date/Time   COLORURINE YELLOW 01/21/2009 1719   APPEARANCEUR CLOUDY (A) 01/21/2009 1719   LABSPEC 1.021 01/21/2009 1719   PHURINE 5.0 01/21/2009 1719   GLUCOSEU NEGATIVE 01/21/2009 1719   HGBUR TRACE (A) 01/21/2009 1719   BILIRUBINUR MODERATE (A) 01/21/2009 1719   KETONESUR 15 (A) 01/21/2009 1719   PROTEINUR 30 (A) 01/21/2009 1719   UROBILINOGEN 1.0 01/21/2009 1719   NITRITE POSITIVE (A) 01/21/2009 1719   LEUKOCYTESUR MODERATE (A) 01/21/2009 1719   Sepsis Labs: Invalid input(s): PROCALCITONIN, LACTICIDVEN  Recent Results (from the past 240 hour(s))  MRSA PCR Screening     Status: None   Collection Time: 06/11/18  6:23 AM  Result Value Ref Range Status   MRSA by PCR NEGATIVE NEGATIVE Final    Comment:        The GeneXpert MRSA Assay  (FDA approved for NASAL specimens only), is one component of a comprehensive MRSA colonization surveillance program. It is not intended to diagnose MRSA infection nor to guide or monitor treatment for MRSA infections. Performed at [facility], [place] [address], [place], [place] [address]   Gram stain     Status: None   Collection Time: 06/13/18  5:04 PM  Result Value Ref Range Status   Specimen Description FLUID RIGHT PLEURAL  Final   Special Requests NONE  Final   Gram Stain   Final    ABUNDANT WBC PRESENT,BOTH PMN AND MONONUCLEAR NO ORGANISMS SEEN Performed at [facility], [address], [place], [place] [address]    Report Status 06/13/2018 FINAL  Final  Acid Fast Smear (AFB)     Status: None   Collection Time: 06/13/18  5:04 PM  Result Value Ref Range Status   AFB Specimen Processing Concentration  Final   Acid Fast Smear Negative  Final    Comment: (NOTE) Performed At: [facility] [place], [place] [id number] Rush Farmer MD MH:[id number]    Source (AFB) FLUID  Final    Comment: PLEURAL RIGHT Performed at [facility], [place] [address], [place], [place] [address]   Culture, body fluid-bottle     Status: None (Preliminary result)   Collection Time: 06/13/18  5:04 PM  Result Value Ref Range Status   Specimen Description FLUID RIGHT PLEURAL  Final   Special Requests BOTTLES DRAWN AEROBIC AND ANAEROBIC  Final   Culture   Final    NO GROWTH < 12 HOURS Performed at [facility], [place] [address] [place], [place] [address]    Report Status PENDING  Incomplete      Radiology Studies: Dg Chest 1 View  Result Date: 06/13/2018 CLINICAL DATA:  Post thoracentesis right EXAM: CHEST  1 VIEW COMPARISON:  CT 06/13/2018 FINDINGS: Moderately large right pleural effusion. No pneumothorax post thoracentesis. Diffuse airspace disease on the right unchanged Small left effusion and mild left lower lobe airspace disease. Fracture  proximal left humerus appears subacute with some callus formation. IMPRESSION: No pneumothorax post right thoracentesis. Electronically Signed   By: Franchot Gallo M.D.   On: 06/13/2018 17:43   Ir Thoracentesis Asp Pleural Space W/img Guide  Result Date: 06/13/2018 INDICATION: Patient with  right pleural effusion. Request is made for diagnostic and therapeutic thoracentesis. EXAM: ULTRASOUND GUIDED DIAGNOSTIC AND THERAPEUTIC RIGHT THORACENTESIS MEDICATIONS: 10 mL 1% lidocaine COMPLICATIONS: None immediate. PROCEDURE: An ultrasound guided thoracentesis was thoroughly discussed with the patient and questions answered. The benefits, risks, alternatives and complications were also discussed. The patient understands and wishes to proceed with the procedure. Written consent was obtained. Ultrasound was performed to localize and mark an adequate pocket of fluid in the right chest. The area was then prepped and draped in the normal sterile fashion. 1% Lidocaine was used for local anesthesia. Under ultrasound guidance a 6 Fr Safe-T-Centesis catheter was introduced. Thoracentesis was performed. The catheter was removed and a dressing applied. FINDINGS: A total of approximately 300 mL of bloody fluid was removed. Samples were sent to the laboratory as requested by the clinical team. IMPRESSION: Successful ultrasound guided diagnostic and therapeutic right thoracentesis yielding 300 mL of pleural fluid. Read by: Brynda Greathouse PA-C Electronically Signed   By: Markus Daft M.D.   On: 06/13/2018 17:12    Eleanor Gatliff T. [facility] Pager [phone]  If 7PM-7AM, please contact night-coverage [email or web address] Password [facility] 06/14/2018, 1:36 PM

## 2018-06-14 NOTE — Progress Notes (Signed)
Progress Note  Patient Name: Vanessa Webb Date of Encounter: 06/14/2018  Primary Cardiologist: Carlyle Dolly, MD   Subjective   Seen in dialysis Patient wants to go home She does not appear to be aware of possible [facility] Monday if she does not convert over weekend per Dr Francesca Oman note Discussed in detail with her   Inpatient Medications    Scheduled Meds: . atorvastatin  20 mg Oral QHS  . Chlorhexidine Gluconate Cloth  6 each Topical Q0600  . collagenase   Topical Daily  . feeding supplement (NEPRO CARB STEADY)  237 mL Oral BID BM  . metoprolol tartrate  25 mg Oral BID  . multivitamin  1 tablet Oral QHS  . Warfarin - Pharmacist Dosing Inpatient   Does not apply q1800   Continuous Infusions: . amiodarone 30 mg/hr (06/14/18 0537)   PRN Meds: acetaminophen **OR** acetaminophen, acetaminophen, antiseptic oral rinse, diphenoxylate-atropine, heparin, lidocaine, oxyCODONE   Vital Signs    Vitals:   06/14/18 0730 06/14/18 0800 06/14/18 0818 06/14/18 0830  BP: (!) 114/53 (!) 86/40 (!) 91/43 (!) 107/30  Pulse: (!) 102 91 89 96  Resp:      Temp:      TempSrc:      SpO2:      Weight:      Height:       No intake or output data in the 24 hours ending 06/14/18 0922 Filed Weights   06/13/18 0420 06/14/18 0422 06/14/18 0710  Weight: 52.8 kg 52.8 kg 52 kg   Telemetry    Atrial fibrillation with RVR 80-110 - Personally Reviewed  ECG    afib nonspecific ST changes   Physical Exam   Affect appropriate Elderly white female  HEENT: normal Neck supple with no adenopathy JVP normal no bruits no thyromegaly Lungs clear with no wheezing and good diaphragmatic motion Heart:  S1/S2 SEM murmur, no rub, gallop or click PMI normal Abdomen: benighn, BS positve, no tenderness, no AAA no bruit.  No HSM or HJR Distal pulses intact with no bruits No edema Neuro non-focal Skin warm and dry Fistula RUE   Labs    Chemistry Recent Labs  Lab 06/12/18 0431 06/13/18 0401  06/13/18 2041 06/14/18 0736  NA 132* 136  --  136  K 3.9 4.5  --  4.2  CL 92* 98  --  95*  CO2 23 25  --  24  GLUCOSE 102* 109*  --  105*  BUN 63* 28*  --  36*  CREATININE 6.73* 3.97*  --  4.81*  CALCIUM 8.2* 8.1*  --  8.1*  PROT 5.8*  --  6.2*  --   ALBUMIN 1.8*  --  1.7* 1.5*  AST 42*  --  64*  --   ALT 23  --  31  --   ALKPHOS 74  --  73  --   BILITOT 0.9  --  0.7  --   GFRNONAA 6* 10*  --  8*  GFRAA 6* 12*  --  10*  ANIONGAP 17* 13  --  17*     Hematology Recent Labs  Lab 06/12/18 0431 06/13/18 1809 06/14/18 0736  WBC 10.9* 15.8* 17.2*  RBC 3.85* 4.21 3.59*  HGB 10.3* 11.5* 9.6*  HCT 32.8* 36.0 30.6*  MCV 85.2 85.5 85.2  MCH 26.8 27.3 26.7  MCHC 31.4 31.9 31.4  RDW 16.5* 16.9* 16.9*  PLT 132* 173 163   Cardiac EnzymesNo results for input(s): TROPONINI in the last  168 hours. No results for input(s): TROPIPOC in the last 168 hours.   BNP Recent Labs  Lab 06/11/18 1113  BNP 1,119.1*    DDimer No results for input(s): DDIMER in the last 168 hours.   Radiology    Dg Chest 1 View  Result Date: 06/13/2018 CLINICAL DATA:  Post thoracentesis right EXAM: CHEST  1 VIEW COMPARISON:  CT 06/13/2018 FINDINGS: Moderately large right pleural effusion. No pneumothorax post thoracentesis. Diffuse airspace disease on the right unchanged Small left effusion and mild left lower lobe airspace disease. Fracture proximal left humerus appears subacute with some callus formation. IMPRESSION: No pneumothorax post right thoracentesis. Electronically Signed   By: Franchot Gallo M.D.   On: 06/13/2018 17:43   Ct Chest Wo Contrast  Result Date: 06/13/2018 CLINICAL DATA:  Invasion. End-stage renal disease on hemodialysis. Evaluate pleural effusion. EXAM: CT CHEST WITHOUT CONTRAST TECHNIQUE: Multidetector CT imaging of the chest was performed following the standard protocol without IV contrast. COMPARISON:  06/11/2018 chest radiograph. FINDINGS: Cardiovascular: Top-normal heart size. No  significant pericardial effusion/thickening. Left main and 3 vessel coronary atherosclerosis. Atherosclerotic nonaneurysmal thoracic aorta. Dilated main pulmonary artery (3.8 cm diameter). Mediastinum/Nodes: No discrete thyroid nodules. Unremarkable esophagus. No axillary adenopathy. Right paratracheal adenopathy up to 1.5 cm (series 3/image 57). No discrete hilar adenopathy on this noncontrast scan. Lungs/Pleura: No pneumothorax. Large loculated right pleural effusion with diffuse irregular right pleural thickening. Small dependent left pleural effusion. No appreciable calcified pleural plaques. Near complete right lung atelectasis with mild residual aeration predominantly in the right upper lobe. Mild compressive atelectasis in the dependent left lower lobe. No discrete lung masses. Solid left upper lobe 8 mm pulmonary nodule (series 5/image 53). No additional significant pulmonary nodules. No central airway stenoses. Upper abdomen: Liver surface appears slightly irregular, cannot exclude hepatic cirrhosis. Small volume perihepatic ascites. Musculoskeletal: No aggressive appearing focal osseous lesions. Mild thoracic spondylosis. Extensively comminuted left proximal humerus fracture is partially visualized. IMPRESSION: 1. Large loculated right pleural effusion with diffuse irregular right pleural thickening. Right pleural empyema or neoplasm cannot be excluded. Suggest diagnostic right thoracentesis. 2. Near complete right lung atelectasis. 3. Small dependent left pleural effusion with mild dependent left lung base atelectasis. No left pleural thickening. 4. Solid 8 mm left upper lobe pulmonary nodule. If evaluation of the right pleural effusion reveals neoplastic etiology, suggested attention to this nodule on follow-up chest CT in 3 months. Otherwise, follow-up chest CT in 6 months recommended. 5. Suggestion of slight liver surface irregularity, cannot exclude appendix cirrhosis. Small volume perihepatic  ascites. 6. Dilated main pulmonary artery, suggesting pulmonary arterial hypertension. 7. Nonspecific mild right paratracheal lymphadenopathy. If evaluation of the right pleural effusion reveals neoplastic etiology, recommend attention on follow-up chest CT with IV contrast in 3 months. 8. Left main and 3 vessel coronary atherosclerosis. Aortic Atherosclerosis (ICD10-I70.0). Electronically Signed   By: Ilona Sorrel M.D.   On: 06/13/2018 09:38   Ir Thoracentesis Asp Pleural Space W/img Guide  Result Date: 06/13/2018 INDICATION: Patient with right pleural effusion. Request is made for diagnostic and therapeutic thoracentesis. EXAM: ULTRASOUND GUIDED DIAGNOSTIC AND THERAPEUTIC RIGHT THORACENTESIS MEDICATIONS: 10 mL 1% lidocaine COMPLICATIONS: None immediate. PROCEDURE: An ultrasound guided thoracentesis was thoroughly discussed with the patient and questions answered. The benefits, risks, alternatives and complications were also discussed. The patient understands and wishes to proceed with the procedure. Written consent was obtained. Ultrasound was performed to localize and mark an adequate pocket of fluid in the right chest. The area was then prepped  and draped in the normal sterile fashion. 1% Lidocaine was used for local anesthesia. Under ultrasound guidance a 6 Fr Safe-T-Centesis catheter was introduced. Thoracentesis was performed. The catheter was removed and a dressing applied. FINDINGS: A total of approximately 300 mL of bloody fluid was removed. Samples were sent to the laboratory as requested by the clinical team. IMPRESSION: Successful ultrasound guided diagnostic and therapeutic right thoracentesis yielding 300 mL of pleural fluid. Read by: Brynda Greathouse PA-C Electronically Signed   By: Markus Daft M.D.   On: 06/13/2018 17:12    Cardiac Studies   Echocardiogram 06/12/2018: 1. The left ventricle has mild-moderately reduced systolic function, with an ejection fraction of 40-45%. The cavity size was  normal. There is mildly increased left ventricular wall thickness. Left ventricular diastolic Doppler parameters are consistent with impaired relaxation. Left ventricular diffuse hypokinesis.  2. The right ventricle has normal systolc function. Right ventricular systolic pressure is mildly elevated with an estimated pressure of 41.7 mmHg.  3. The mitral valve is degenerative. Moderate thickening of the mitral valve leaflet. Moderate calcification of the mitral valve leaflet. Mitral valve regurgitation is moderate by color flow Doppler.  4. The aortic valve is tricuspid Moderate thickening of the aortic valve Moderate calcification of the aortic valve.  5. The aortic root is normal in size and structure.  Patient Profile   Vanessa Webb is a 73 y.o. female with a history of paroxysmal atrial fibrillation, chronic anticoagulation therapy with Coumadin, ESRD on hemodialysis (T/Th/Sat), chronic anemia, chronic thrombocytopenia, type 2 diabetes mellitus, hypertension, and hyperlipidemia who is being seen for the evaluation of atrial fibrillation with RVR at the request of Dr. Cruzita Lederer (Internal Medicine).  Assessment & Plan    Paroxysmal Atrial Fibrillation with RVR  Still in afib rate control better INR Rx Discussed [place] at length and spoke with husband at home continue iv amiodarone and plan [facility] Monday if no spontaneous conversion INR Rx   Acute Combined CHF  - BNP 1119 EF 40-45% volume managed with dialysis post IR thoracentesis restore NSR to add AV synchrony    Pleural Effusion - improved post thoracentesis on right CXR improved no pneumothorax  Hypertension - Currently well controlled. - Continue current medications.  Hyperlipidemia - Continue home Lipitor 20mg  daily.  ESRD on Hemodialysis (T/Th/Sat) - Management per Nephrology.  Chronic Anemia and Thrombocytopenia - Likely secondary to ESRD. - Hemoglobin  9.6  today. Baseline appears to be around the 9 to 10 range. - Platelets 163  today. Baseline appears to be anywhere from 80 to 130 range. - Management per primary team.   For questions or updates, please contact [place] Please consult [email or web address] for contact info under Cardiology/STEMI.      Signed, Jenkins Rouge, MD  06/14/2018, 9:22 AM   Pager: [phone]

## 2018-06-14 NOTE — Progress Notes (Signed)
Patient ID: Vanessa Webb, female   DOB: 05/06/1945, 73 y.o.   MRN: [id number] [facility] Progress Note   Assessment/ Plan:   1.  Atrial fibrillation with rapid ventricular response: Intermittent RVR noted overnight but has improved on amiodarone drip.    On beta-blocker with metoprolol and anticoagulation with warfarin and plans for DCCV if does not convert to NSR over weekend. 2.  End-stage renal disease: Continue TTS HD with efforts at UF as tolerable by blood pressures. 3.  Hyponatremia: improved with HD/UF, advised to limit intake. 4.  Anemia: Hemoglobin and hematocrit currently within acceptable range, will continue to follow trend to decide on need for ESA therapy. 5.  Secondary hyperparathyroidism: We will follow calcium/phosphorus levels. 6.  Protein calorie malnutrition: Significant hypoalbuminemia noted most possibly arising from nonhealing leg ulcer/acute phase reactant.  Continue renal vitamin/ONS.  Subjective:   Thoracentesis done yesterday yielding 311mL- no significant impact on her dyspnea. Possible DCCV Monday.   Objective:   BP (!) (P) 100/51   Pulse (!) (P) 104   Temp (!) 97.5 F (36.4 C) (Axillary)   Resp (!) 24   Ht 5\' 4"  (1.626 m)   Wt 52 kg   SpO2 99%   BMI 19.68 kg/m   Physical Exam: Gen: Appears comfortable at dialysis CVS: Pulse irregularly irregular, mild tachycardia-104, S1 and S2 normal Resp: Decreased breath sounds bibasally without distinct rales. Abd: Soft, flat, nontender Ext: 1+ lower extremity edema, right brachiocephalic fistula cannulated  Labs: BMET Recent Labs  Lab 06/11/18 0640 06/12/18 0431 06/13/18 0401 06/14/18 0736  NA 133* 132* 136 136  K 4.0 3.9 4.5 4.2  CL 93* 92* 98 95*  CO2 23 23 25 24   GLUCOSE 127* 102* 109* 105*  BUN 55* 63* 28* 36*  CREATININE 5.91* 6.73* 3.97* 4.81*  CALCIUM 8.5* 8.2* 8.1* 8.1*  PHOS  --   --   --  4.1   CBC Recent Labs  Lab 06/11/18 0640 06/12/18 0431 06/13/18 1809  06/14/18 0736  WBC 10.9* 10.9* 15.8* 17.2*  HGB 10.4* 10.3* 11.5* 9.6*  HCT 33.3* 32.8* 36.0 30.6*  MCV 86.7 85.2 85.5 85.2  PLT 127* 132* 173 163   Medications:    . atorvastatin  20 mg Oral QHS  . Chlorhexidine Gluconate Cloth  6 each Topical Q0600  . collagenase   Topical Daily  . feeding supplement (NEPRO CARB STEADY)  237 mL Oral BID BM  . metoprolol tartrate  25 mg Oral BID  . multivitamin  1 tablet Oral QHS  . Warfarin - Pharmacist Dosing Inpatient   Does not apply [id number]   Elmarie Shiley, MD 06/14/2018, 10:55 AM

## 2018-06-14 NOTE — Progress Notes (Signed)
Patient noted with hallucination at the beginning of the shift. She states "there is a monster in the room, look at those animals on the ceiling, my husband is in the room but he does not want to talk to me". Patient's daughter called that she spoke to her mom and noticed that she was hallucinating. Daughter is concern and wants to know why the pt was hallucinating and if the medications she is taking are causing her to hallucinate. Pt reoriented; will continue to monitor.

## 2018-06-15 ENCOUNTER — Inpatient Hospital Stay (HOSPITAL_COMMUNITY): Payer: Medicare Other

## 2018-06-15 ENCOUNTER — Encounter (HOSPITAL_COMMUNITY): Payer: Self-pay | Admitting: Thoracic Surgery (Cardiothoracic Vascular Surgery)

## 2018-06-15 DIAGNOSIS — I480 Paroxysmal atrial fibrillation: Secondary | ICD-10-CM

## 2018-06-15 DIAGNOSIS — I5042 Chronic combined systolic (congestive) and diastolic (congestive) heart failure: Secondary | ICD-10-CM | POA: Diagnosis present

## 2018-06-15 DIAGNOSIS — J90 Pleural effusion, not elsewhere classified: Secondary | ICD-10-CM | POA: Diagnosis present

## 2018-06-15 DIAGNOSIS — S42302A Unspecified fracture of shaft of humerus, left arm, initial encounter for closed fracture: Secondary | ICD-10-CM | POA: Diagnosis present

## 2018-06-15 DIAGNOSIS — I471 Supraventricular tachycardia: Secondary | ICD-10-CM

## 2018-06-15 DIAGNOSIS — N186 End stage renal disease: Secondary | ICD-10-CM

## 2018-06-15 DIAGNOSIS — Z8781 Personal history of (healed) traumatic fracture: Secondary | ICD-10-CM

## 2018-06-15 DIAGNOSIS — S42202S Unspecified fracture of upper end of left humerus, sequela: Secondary | ICD-10-CM

## 2018-06-15 DIAGNOSIS — J869 Pyothorax without fistula: Secondary | ICD-10-CM

## 2018-06-15 DIAGNOSIS — R791 Abnormal coagulation profile: Secondary | ICD-10-CM

## 2018-06-15 DIAGNOSIS — Z992 Dependence on renal dialysis: Secondary | ICD-10-CM

## 2018-06-15 LAB — MAGNESIUM: Magnesium: 1.8 mg/dL (ref 1.7–2.4)

## 2018-06-15 LAB — CBC
HCT: 33.1 % — ABNORMAL LOW (ref 36.0–46.0)
Hemoglobin: 10.7 g/dL — ABNORMAL LOW (ref 12.0–15.0)
MCH: 27.1 pg (ref 26.0–34.0)
MCHC: 32.3 g/dL (ref 30.0–36.0)
MCV: 83.8 fL (ref 80.0–100.0)
Platelets: 134 10*3/uL — ABNORMAL LOW (ref 150–400)
RBC: 3.95 MIL/uL (ref 3.87–5.11)
RDW: 17 % — ABNORMAL HIGH (ref 11.5–15.5)
WBC: 19.6 10*3/uL — ABNORMAL HIGH (ref 4.0–10.5)
nRBC: 0 % (ref 0.0–0.2)

## 2018-06-15 LAB — BASIC METABOLIC PANEL
Anion gap: 15 (ref 5–15)
BUN: 19 mg/dL (ref 8–23)
CO2: 27 mmol/L (ref 22–32)
Calcium: 7.9 mg/dL — ABNORMAL LOW (ref 8.9–10.3)
Chloride: 96 mmol/L — ABNORMAL LOW (ref 98–111)
Creatinine, Ser: 3 mg/dL — ABNORMAL HIGH (ref 0.44–1.00)
GFR calc Af Amer: 17 mL/min — ABNORMAL LOW (ref >60)
GFR calc non Af Amer: 15 mL/min — ABNORMAL LOW (ref >60)
Glucose, Bld: 95 mg/dL (ref 70–99)
Potassium: 3.6 mmol/L (ref 3.5–5.1)
Sodium: 138 mmol/L (ref 135–145)

## 2018-06-15 LAB — GLUCOSE, CAPILLARY
Glucose-Capillary: 94 mg/dL (ref 70–99)
Glucose-Capillary: 119 mg/dL — ABNORMAL HIGH (ref 70–99)

## 2018-06-15 LAB — PROTIME-INR
INR: 3.7 — ABNORMAL HIGH (ref 0.8–1.2)
Prothrombin Time: 36 seconds — ABNORMAL HIGH (ref 11.4–15.2)

## 2018-06-15 MED ORDER — SODIUM CHLORIDE 0.9 % IV SOLN
3.0000 g | Freq: Two times a day (BID) | INTRAVENOUS | Status: DC
  Administered 2018-06-15 (×2): 3 g via INTRAVENOUS
  Filled 2018-06-15 (×5): qty 3

## 2018-06-15 MED ORDER — VITAMIN K1 10 MG/ML IJ SOLN
10.0000 mg | Freq: Once | INTRAVENOUS | Status: AC
  Administered 2018-06-15: 17:00:00 10 mg via INTRAVENOUS
  Filled 2018-06-15: qty 1

## 2018-06-15 MED ORDER — VITAMIN K1 10 MG/ML IJ SOLN
10.0000 mg | Freq: Once | INTRAVENOUS | Status: AC
  Administered 2018-06-16: 10 mg via INTRAVENOUS
  Filled 2018-06-15 (×2): qty 1

## 2018-06-15 MED ORDER — METRONIDAZOLE IN NACL 5-0.79 MG/ML-% IV SOLN
500.0000 mg | Freq: Three times a day (TID) | INTRAVENOUS | Status: DC
  Administered 2018-06-15 – 2018-06-16 (×3): 500 mg via INTRAVENOUS
  Filled 2018-06-15 (×5): qty 100

## 2018-06-15 MED ORDER — MAGNESIUM SULFATE IN D5W 1-5 GM/100ML-% IV SOLN
1.0000 g | Freq: Once | INTRAVENOUS | Status: AC
  Administered 2018-06-15: 1 g via INTRAVENOUS
  Filled 2018-06-15: qty 100

## 2018-06-15 MED ORDER — POTASSIUM CHLORIDE CRYS ER 20 MEQ PO TBCR
20.0000 meq | EXTENDED_RELEASE_TABLET | Freq: Two times a day (BID) | ORAL | Status: DC
  Administered 2018-06-15 – 2018-06-17 (×6): 20 meq via ORAL
  Filled 2018-06-15 (×6): qty 1

## 2018-06-15 NOTE — Progress Notes (Signed)
PROGRESS NOTE  Vanessa Webb PPI:[id number] DOB: May 04, 1945 DOA: 06/11/2018 PCP: Monico Blitz, MD   LOS: 4 days   Brief Narrative / Interim history: 73 year old female with history of ESRD on HD TTS, hypertension, hyperlipidemia, A. fib on Coumadin for anticoagulation and chronic right leg ulcer transferred from [facility] for hemodialysis.  Patient presented to [facility] with 2 weeks of generalized weakness and found to be in A. fib with RVR.  She was placed on diltiazem drip transition to amiodarone prior to transfer. Patient is dialysis on 4/14 due to weakness.  Reports good compliance with her metoprolol.  Off note, patient has had poor appetite, generalized weakness and about 40 pound weight loss over the last 1 year.  She fell and broke her right hip 6 months ago and underwent IM nailing.  She also fell and broke her left shoulder 3 months ago on the way to dialysis that has been treated conservatively.  Patient remained in A. fib with RVR on amiodarone drip.  Cardiology consulted and planning for possible DCCV early next week.  Large right-sided pleural effusion noted on chest x-ray and CT chest.  These appear to be chronic. She had US-guided diagnostic and therapeutic right thoracocentesis on 4/17 that yielded 300 cc exudative fluid by LDH.  Pleural fluid Gram stain and culture with GNR.  AFB smear negative.  AFB culture and cytology pending.  Started on Unasyn and Flagyl.  Cardiothoracic surgery consulted for possible further evacuation of the pleural effusion on 4/19.  Subjective: No major events overnight of this morning.  Reports some anxiety and difficulty sleeping.  Denies chest pain, dyspnea, palpitation or dizziness.  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  Gram stain and cultures with GNR. AFB smears Gram stain negative.  AFB culture and cytology pending.  Patient without fever but leukocytosis.  CTS consulted on 4/19 and recommend repeat CT chest that showed persistent reportedly loculated large pleural effusion on the right side.  CTS reviewed the CT chest and does not think the effusion is loculated, and recommended chest tube placement by IR after INR reversal.  Per CTS, patient is poor candidate for VATS or open thoracotomy. -Ceftriaxone 4/18-4/19 -Unasyn and Flagyl 4/19-- -Appreciate CTS recommendations -IR, Dr. Jeannine Kitten consulted. -Warfarin stopped.  Given vitamin K for INR reversal. -Recheck INR in the morning -N.p.o. after midnight -Follow culture speciation and sensitivity. -Follow pleural fluid AFB culture and cytology from 4/17.  Paroxysmal atrial fibrillation with RVR: RVR improved.  Heart rate in 90s to 100s this morning.  Chads vascular score 5.  Has bled score 3.  Echo as below.  Thyroid panel within normal range. -Remains on amiodarone drip and home metoprolol per cardiology. -Warfarin on hold.  INR reversal with vitamin K for chest tube placement tomorrow -Closely monitor electrolytes and replenish -We will discuss about long-term anticoagulation risk and benefits given history of falls.   Acute on chronic combined CHF: BNP elevated but difficult to interpret in the setting of ESRD.  Likely due to missed  dialysis A. Fib. Echo this admission showed LVEF of 40-45% (down from 55-60% in 02/2016) with diffuse hypokinesis. Reduced EF likely due to RVR.  -Volume managed by HD  Leukocytosis: Multiple potential sources including possible  parapneumonic pleural effusion, pressure injury of the skin, RLE ulcer and stress.  Pressure skin injury and RLE ulcer appears to be clean.  -Antibiotic as above -Manage pleural effusion as above -Continue trending.  Pulmonary nodule/mild right paratracheal lymphadenopathy: 8 mm LUL pulmonary nodule noted on CT chest. -Outpatient CT follow-up in 3 to 6 months for the nodule  Recurrent fall/right hip fracture/comminuted left proximal humerus fracture -Status post IM nailing of right hip 6 months ago -Left shoulder being managed conservatively  Hypertension: Normotensive. -Manage as above.  ESRD on HD TTS/anemia of chronic disease/mineral bone disease -On HD this morning. -Nephrology managing.  Right lower extremity wound: followed by wound care outpatient.  Reportedly improving per patient. -Appreciate wound care input -Outpatient follow-up.  ?DM-2: A1c 5.9%.  CBG within normal range. -Discontinued CBG monitoring  RLE wound: POA-no signs of infection.  -lateral 12cm x 1.5cm x 0.2cm; 90% black soft eschar -Medial 4cm x 2.0cm x 0.2cm; 50% black soft eschar -Appreciate wound care input-cnzymatic debridement ointment to the LE wounds, wet-to-dry dressing            Stage III sacral wound: POA.  0.4cm x 0.2cm x 0.1cm; scabbed.  No drainage -Enzymatic debridement ointment to the sacrum  Scheduled Meds: . atorvastatin  20 mg Oral QHS  . Chlorhexidine Gluconate Cloth  6 each Topical Q0600  . collagenase   Topical Daily  . feeding supplement (NEPRO CARB STEADY)  237 mL Oral BID BM  . metoprolol tartrate  25 mg Oral BID  . multivitamin  1 tablet Oral QHS  . potassium chloride  20 mEq Oral BID   Continuous Infusions: . amiodarone 30 mg/hr (06/15/18 0540)  . ampicillin-sulbactam (UNASYN) IV 3 g (06/15/18 1216)  . metronidazole 500 mg (06/15/18 1319)  . phytonadione (VITAMIN K) IV    . [START ON 06/16/2018] phytonadione (VITAMIN K) IV     PRN Meds:.acetaminophen **OR**  acetaminophen, acetaminophen, antiseptic oral rinse, lidocaine, oxyCODONE  DVT prophylaxis: On Coumadin for atrial fibrillation Code Status: DNR Family Communication: None at bedside.  Updated patient's husband over the phone on 4/19.  Disposition Plan: Remains inpatient  Consultants:   Nephrology  Cardiology  Procedures:   HD on 4/16  IR thoracocentesis on 4/17  Antimicrobials:  None  Objective: Vitals:   06/14/18 1347 06/14/18 2145 06/14/18 2156 06/15/18 0619  BP: 129/76 116/63  110/70  Pulse:  97 (!) 101 (!) 102  Resp:   (!) 29 20  Temp: 97.7 F (36.5 C)  98.5 F (36.9 C) 97.6 F (36.4 C)  TempSrc: Oral  Oral Oral  SpO2: 95%  97% 98%  Weight:    52 kg  Height:       No intake or output data in the 24 hours ending 06/15/18 1323 Filed Weights   06/14/18 0710 06/14/18 1133 06/15/18 0619  Weight: 52 kg 51.5 kg 52 kg    Examination: GENERAL: Appears well. No acute distress.  HEENT: MMM.  Vision and Hearing grossly intact.  NECK: Supple.  No JVD.  LUNGS:  No IWOB.  Diminished aeration over RLL HEART: Heart rate in 100s.  Irregular.  Heart sounds normal. ABD: Bowel sounds present. Soft. Non tender.  EXT:   no edema bilaterally.  Dry and clean dressing over RLE.  No  surrounding skin erythema or drainage.  SKIN: Stage III sacral wound as above.  Right lower extremity wound-appears clean and dry. NEURO: Awake, alert and oriented appropriately.  No gross deficit.  PSYCH: Calm. Normal affect.  Data Reviewed: I have independently reviewed following labs and imaging studies  CBC: Recent Labs  Lab 06/11/18 0640 06/12/18 0431 06/13/18 1809 06/14/18 0736 06/15/18 0655  WBC 10.9* 10.9* 15.8* 17.2* 19.6*  HGB 10.4* 10.3* 11.5* 9.6* 10.7*  HCT 33.3* 32.8* 36.0 30.6* 33.1*  MCV 86.7 85.2 85.5 85.2 83.8  PLT 127* 132* 173 163 017*   Basic Metabolic Panel: Recent Labs  Lab 06/11/18 0640 06/12/18 0431 06/13/18 0401 06/14/18 0736 06/15/18 0406  NA 133* 132*  136 136 138  K 4.0 3.9 4.5 4.2 3.6  CL 93* 92* 98 95* 96*  CO2 23 23 25 24 27   GLUCOSE 127* 102* 109* 105* 95  BUN 55* 63* 28* 36* 19  CREATININE 5.91* 6.73* 3.97* 4.81* 3.00*  CALCIUM 8.5* 8.2* 8.1* 8.1* 7.9*  MG 2.1 2.0 1.9 2.0 1.8  PHOS  --   --   --  4.1  --    GFR: Estimated Creatinine Clearance: 13.5 mL/min (A) (by C-G formula based on SCr of 3 mg/dL (H)). Liver Function Tests: Recent Labs  Lab 06/12/18 0431 06/13/18 2041 06/14/18 0736  AST 42* 64*  --   ALT 23 31  --   ALKPHOS 74 73  --   BILITOT 0.9 0.7  --   PROT 5.8* 6.2*  --   ALBUMIN 1.8* 1.7* 1.5*   No results for input(s): LIPASE, AMYLASE in the last 168 hours. No results for input(s): AMMONIA in the last 168 hours. Coagulation Profile: Recent Labs  Lab 06/11/18 0911 06/12/18 0431 06/13/18 0401 06/14/18 0921 06/15/18 0406  INR 4.0* 4.0* 3.7* 4.0* 3.7*   Cardiac Enzymes: No results for input(s): CKTOTAL, CKMB, CKMBINDEX, TROPONINI in the last 168 hours. BNP (last 3 results) No results for input(s): PROBNP in the last 8760 hours. HbA1C: Recent Labs    06/13/18 0401  HGBA1C 5.9*   CBG: Recent Labs  Lab 06/13/18 0752 06/13/18 1140 06/13/18 1900 06/13/18 2119 06/14/18 2154  GLUCAP 102* 119* 103* 96 98   Lipid Profile: No results for input(s): CHOL, HDL, LDLCALC, TRIG, CHOLHDL, LDLDIRECT in the last 72 hours. Thyroid Function Tests: Recent Labs    06/13/18 0401  TSH 3.022  FREET4 1.10   Anemia Panel: No results for input(s): VITAMINB12, FOLATE, FERRITIN, TIBC, IRON, RETICCTPCT in the last 72 hours. Urine analysis:    Component Value Date/Time   COLORURINE YELLOW 01/21/2009 1719   APPEARANCEUR CLOUDY (A) 01/21/2009 1719   LABSPEC 1.021 01/21/2009 1719   PHURINE 5.0 01/21/2009 1719   GLUCOSEU NEGATIVE 01/21/2009 1719   HGBUR TRACE (A) 01/21/2009 1719   BILIRUBINUR MODERATE (A) 01/21/2009 1719   KETONESUR 15 (A) 01/21/2009 1719   PROTEINUR 30 (A) 01/21/2009 1719   UROBILINOGEN 1.0  01/21/2009 1719   NITRITE POSITIVE (A) 01/21/2009 1719   LEUKOCYTESUR MODERATE (A) 01/21/2009 1719   Sepsis Labs: Invalid input(s): PROCALCITONIN, LACTICIDVEN  Recent Results (from the past 240 hour(s))  MRSA PCR Screening     Status: None   Collection Time: 06/11/18  6:23 AM  Result Value Ref Range Status   MRSA by PCR NEGATIVE NEGATIVE Final    Comment:        The GeneXpert MRSA Assay (FDA approved for NASAL specimens only), is one component of a comprehensive MRSA colonization  surveillance program. It is not intended to diagnose MRSA infection nor to guide or monitor treatment for MRSA infections. Performed at [facility], [place] [address], [place], [place] [address]   Gram stain     Status: None   Collection Time: 06/13/18  5:04 PM  Result Value Ref Range Status   Specimen Description FLUID RIGHT PLEURAL  Final   Special Requests NONE  Final   Gram Stain   Final    ABUNDANT WBC PRESENT,BOTH PMN AND MONONUCLEAR NO ORGANISMS SEEN Performed at [facility], [address], [place], [place] [address]    Report Status 06/13/2018 FINAL  Final  Acid Fast Smear (AFB)     Status: None   Collection Time: 06/13/18  5:04 PM  Result Value Ref Range Status   AFB Specimen Processing Concentration  Final   Acid Fast Smear Negative  Final    Comment: (NOTE) Performed At: [facility] [place], [place] [id number] Rush Farmer MD MQ:[id number]    Source (AFB) FLUID  Final    Comment: PLEURAL RIGHT Performed at [facility], [place] [address], [place], [place] [address]   Culture, body fluid-bottle     Status: None (Preliminary result)   Collection Time: 06/13/18  5:04 PM  Result Value Ref Range Status   Specimen Description FLUID RIGHT PLEURAL  Final   Special Requests BOTTLES DRAWN AEROBIC AND ANAEROBIC  Final   Gram Stain   Final    GRAM NEGATIVE RODS AEROBIC BOTTLE ONLY CRITICAL RESULT CALLED TO, READ BACK BY AND VERIFIED WITH: E.  MONGE, RN CONCERNING GRAM STAIN AT 3833 ON 06/14/18 BY C. JESSUP, MLT Performed at [facility], [place] [address], [place], [place] [address]    Culture GRAM NEGATIVE RODS  Final   Report Status PENDING  Incomplete      Radiology Studies: No results found.  Carneshia Raker T. [facility] Pager [phone]  If 7PM-7AM, please contact night-coverage [email or web address] Password [email or web address] 06/15/2018, 1:23 PM

## 2018-06-15 NOTE — Progress Notes (Signed)
Patient ID: Vanessa Webb, female   DOB: February 04, 1946, 73 y.o.   MRN: [id number] [facility] Progress Note   Assessment/ Plan:   1.  Atrial fibrillation with rapid ventricular response:  Remains intermittently tachycardic with better rate control and plans for cardioversion tomorrow.  On intravenous amiodarone. 2.  End-stage renal disease: Continue TTS HD with acute indications for dialysis noted today. 3.  Hyponatremia: improved with HD/UF, advised to limit intake. 4.  Anemia: Hemoglobin and hematocrit currently within acceptable range, will continue to follow trend to decide on need for ESA therapy. 5.  Secondary hyperparathyroidism: We will follow calcium/phosphorus levels. 6.  Protein calorie malnutrition: Significant hypoalbuminemia noted most possibly arising from nonhealing leg ulcer/acute phase reactant.  Continue renal vitamin/ONS. 7.  Hallucinations: I have discontinued Lomotil and would recommend switching her from metronidazole to alternate agent.  Subjective:   Visual hallucinations noted overnight.   Objective:   BP 110/70 (BP Location: Left Arm)   Pulse (!) 102   Temp 97.6 F (36.4 C) (Oral)   Resp 20   Ht 5\' 4"  (1.626 m)   Wt 52 kg   SpO2 98%   BMI 19.68 kg/m   Physical Exam: Gen: Appears comfortable sitting in bed, oriented to place and person CVS: Pulse irregularly irregular, mild tachycardia-104, S1 and S2 normal Resp: Decreased breath sounds bibasally without distinct rales. Abd: Soft, flat, nontender Ext: 1+ lower extremity edema, right brachiocephalic fistula cannulated  Labs: BMET Recent Labs  Lab 06/11/18 0640 06/12/18 0431 06/13/18 0401 06/14/18 0736 06/15/18 0406  NA 133* 132* 136 136 138  K 4.0 3.9 4.5 4.2 3.6  CL 93* 92* 98 95* 96*  CO2 23 23 25 24 27   GLUCOSE 127* 102* 109* 105* 95  BUN 55* 63* 28* 36* 19  CREATININE 5.91* 6.73* 3.97* 4.81* 3.00*  CALCIUM 8.5* 8.2* 8.1* 8.1* 7.9*  PHOS  --   --   --  4.1  --    CBC Recent  Labs  Lab 06/12/18 0431 06/13/18 1809 06/14/18 0736 06/15/18 0655  WBC 10.9* 15.8* 17.2* 19.6*  HGB 10.3* 11.5* 9.6* 10.7*  HCT 32.8* 36.0 30.6* 33.1*  MCV 85.2 85.5 85.2 83.8  PLT 132* 173 163 134*   Medications:    . atorvastatin  20 mg Oral QHS  . Chlorhexidine Gluconate Cloth  6 each Topical Q0600  . collagenase   Topical Daily  . feeding supplement (NEPRO CARB STEADY)  237 mL Oral BID BM  . metoprolol tartrate  25 mg Oral BID  . multivitamin  1 tablet Oral QHS  . potassium chloride  20 mEq Oral BID  . Warfarin - Pharmacist Dosing Inpatient   Does not apply [id number]   Elmarie Shiley, MD 06/15/2018, 10:45 AM

## 2018-06-15 NOTE — Progress Notes (Signed)
ANTICOAGULATION CONSULT NOTE -   Pharmacy Consult for Coumadin  Indication: atrial fibrillation with RVR, h/o afib on coumadin PTA.  Allergies  Allergen Reactions  . Sulfa Antibiotics Nausea Only    Patient Measurements: Height: 5\' 4"  (162.6 cm) Weight: 114 lb 10.2 oz (52 kg) IBW/kg (Calculated) : 54.7 Heparin Dosing Weight:  Vital Signs: Temp: 97.6 F (36.4 C) (04/19 0619) Temp Source: Oral (04/19 0619) BP: 110/70 (04/19 0619) Pulse Rate: 102 (04/19 0619)  Labs: Recent Labs    06/13/18 0401 06/13/18 1809 06/14/18 0736 06/14/18 0921 06/15/18 0406  HGB  --  11.5* 9.6*  --   --   HCT  --  36.0 30.6*  --   --   PLT  --  173 163  --   --   LABPROT 35.8*  --   --  38.4* 36.0*  INR 3.7*  --   --  4.0* 3.7*  CREATININE 3.97*  --  4.81*  --  3.00*    Estimated Creatinine Clearance: 13.5 mL/min (A) (by C-G formula based on SCr of 3 mg/dL (H)).   Medical History: Past Medical History:  Diagnosis Date  . Anemia    takes Folic Acid;pt states she gets an injection every 2wks   . Closed right hip fracture, initial encounter ([facility]) 10/19/2017  . Depression    but doesn't take any meds for it  . Diabetes mellitus    takes Lantus and Novolog 70/30;fasting blood sugar 200;Type 2  . GERD (gastroesophageal reflux disease)    takes Omeprazole daily  . GI bleeding   . History of bladder infections    last one a month ago;saw Dr.Wrenn and cysto was done in office;was on antibiotics and completed 2wks ago  . History of blood transfusion    no blood transfusion  . HLD (hyperlipidemia) 10/19/2017  . HTN (hypertension) 10/19/2017  . Hyperlipidemia    takes Zocor daily  . Hypertension    takes Coreg daily  . Myocardial infarction ([place]) 2010  . Pancreatitis   . Peripheral neuropathy   . UTI (lower urinary tract infection)     Medications:  Medications Prior to Admission  Medication Sig Dispense Refill Last Dose  . acetaminophen (TYLENOL) 500 MG tablet Take 500 mg by mouth  every 6 (six) hours as needed (for pain.).   Past Month at Unknown time  . amLODipine (NORVASC) 2.5 MG tablet TAKE ONE TABLET BY MOUTH ON NON DIALYSIS DAYS. (MONDAY, WEDNESDAY, FRIDAY & SUNDAY) (Patient taking differently: Take 2.5 mg by mouth See admin instructions. TAKE ONE TABLET BY MOUTH ON NON DIALYSIS DAYS. (MONDAY, WEDNESDAY, FRIDAY & SUNDAY)) 70 tablet 3 06/09/2018  . atorvastatin (LIPITOR) 20 MG tablet Take 20 mg by mouth at bedtime.   06/09/2018  . lidocaine-prilocaine (EMLA) cream Apply 1 application topically daily as needed (prior to port being accessed).    06/07/2018  . metoprolol tartrate (LOPRESSOR) 25 MG tablet TAKE 1 AND 1/2 TABLETS BY MOUTH TWICE DAILY. (MORNING,BEDTIME) (Patient taking differently: Take 25 mg by mouth 2 (two) times daily. ) 270 tablet 3 06/09/2018 at 8-10am  . oxyCODONE (OXY IR/ROXICODONE) 5 MG immediate release tablet Take 1 tablet (5 mg total) by mouth every 4 (four) hours as needed for severe pain. 20 tablet 0 Past Month at Unknown time  . warfarin (COUMADIN) 2.5 MG tablet Take 1 tablet daily except 1/2 tablet on Saturdays as directed (Patient taking differently: Take 1.25-2.5 mg by mouth See admin instructions. Take 2.5MG  by mouth daily except  1.25MG  on saturdays.) 30 tablet 6 06/09/2018 at 8:30PM    Assessment: 73 y.o female on chronic coumadin PTA for h/o PAF. Coumadin is followed by Dr. Harl Bowie in [place] [facility] up in [place], [place].   She presented to [facility] hospital for genearlized weakness and she was found to be in A. fib with RVR. Started on IV amiodarone drip.  Transferred to [facility] on 06/11/18..   Admit  INR = 4.0 , warfarin dose held.  INR remains 3.7, supratherapeutic.   Anemia: H/H low/stable Hgb 10.3>11.5>9.6; pltc 127>132 improved to 163k.  .  -ESRD- HD TTS, missed HD  Past history of GIB noted, date not reported.   PTA Warfarin 2.5 mg daily except 1.25 mg qSat, LD taken pta 4/13 @ 2030   Goal of Therapy:  INR 2-3 Monitor platelets by  anticoagulation protocol: Yes   Plan:  Continue to hold Coumadin today  Daily INR  Marguerite Olea, [facility] Clinical Pharmacist Phone [phone]  06/15/2018 10:16 AM    06/15/2018,10:15 AM

## 2018-06-15 NOTE — Consult Note (Addendum)
[address]       [place] [address]             [phone]          CARDIOTHORACIC SURGERY CONSULTATION REPORT  PCP is Monico Blitz, MD Referring Provider is Wendee Beavers, MD Primary Cardiologist is Carlyle Dolly, MD  Reason for consultation:  empyema  HPI:  Patient is a 73 year old female with multiple chronic medical problems including end-stage renal disease on chronic hemodialysis, previous myocardial infarction, hypertension, chronic diastolic congestive heart failure, mitral regurgitation, type 2 diabetes mellitus, recurrent paroxysmal atrial fibrillation on long-term warfarin anticoagulation, hyperlipidemia, multiple falls with recent fracture of left shoulder and right hip, recently discovered lung nodule, peripheral neuropathy, chronic weight loss, 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  Patient states that she has had anorexia, severe generalized weakness, and a 40 pound weight loss which has gradually progressed over the past year.  She fell and broke her right hip 6 months ago and underwent IM nailing.  She broke her left shoulder 3 months ago and has persistent pain and decreased function of the left arm.  Her shoulder fracture has been treated conservatively.  This past December she was hospitalized for an exacerbation of congestive heart failure.  She complains of chronic progression of generalized weakness.  She eventually was taken to [facility] by her husband because of severe weakness.  She was found to be in rapid atrial fibrillation and a chest x-ray revealed large right pleural effusion.  She was transferred to [facility] for further management.  CT scan of the chest performed June 13, 2018 revealed large right pleural effusion and small left pleural effusion  with suggestion of possible loculations on the right.  There was a small (8 mm) nodule in the left upper lobe.  The patient underwent right thoracentesis yielding a small amount (370mL) of blood tinged fluid.  Pleural fluid LDH was 410 with total protein less than 3 and fluid amylase 22.  Cytology was not done or has not been reported.  Pleural fluid culture is reportedly growing gram-negative rods with final culture pending.  The patient had a portable chest x-ray immediately following thoracentesis which revealed persistent large right pleural effusion and no pneumothorax.  No follow-up imaging has been performed.  Patient's atrial fibrillation has been treated with amiodarone and plans have been made for DC cardioversion tomorrow.  Following report of the patient's pleural fluid culture the patient was started on intravenous Unasyn and cardiothoracic surgical consultation was requested.  Patient is married and lives with her husband in [place].  She is chronically debilitated but fully ambulatory.  She denies any history of symptoms of fever, chills, or productive cough.  She denies any pain in her chest or otherwise.  She denies pleuritic chest discomfort.  She reports very poor appetite and 40 pound weight loss over the past year.  She denies shortness of breath.  She has not experienced symptoms of palpitations or dizzy spells.  She complains of persistent soreness in her left shoulder related to her shoulder fracture.  She dialyzes on a Tuesday Thursday Saturday schedule via right upper arm AV fistula.  She reportedly has not had problems with dialysis treatments recently.   Past Medical History:  Diagnosis Date   Anemia    takes Folic Acid;pt states she  gets an injection every 2wks    Closed right hip fracture, initial encounter ([place]) 10/19/2017   Depression    but doesn't take any meds for it   Diabetes mellitus    takes Lantus and Novolog 70/30;fasting blood sugar 200;Type 2   GERD  (gastroesophageal reflux disease)    takes Omeprazole daily   GI bleeding    History of bladder infections    last one a month ago;saw Dr.Wrenn and cysto was done in office;was on antibiotics and completed 2wks ago   History of blood transfusion    no blood transfusion   HLD (hyperlipidemia) 10/19/2017   HTN (hypertension) 10/19/2017   Hyperlipidemia    takes Zocor daily   Hypertension    takes Coreg daily   Myocardial infarction [facility]) 2010   Pancreatitis    Peripheral neuropathy    UTI (lower urinary tract infection)     Past Surgical History:  Procedure Laterality Date   A/V FISTULAGRAM Right 12/03/2016   Procedure: A/V Fistulagram - Right Arm;  Surgeon: Angelia Mould, MD;  Location: [place] [facility];  Service: Cardiovascular;  Laterality: Right;   ABDOMINAL HYSTERECTOMY  1976   APPENDECTOMY     [place] Right 09/11/2012   Procedure: BASCILIC VEIN TRANSPOSITION- RIGHT ARM;  Surgeon: Mal Misty, MD;  Location: [place];  Service: Vascular;  Laterality: Right;   bilateral laser surgery     CARDIAC CATHETERIZATION  2010   CATARACT EXTRACTION W/PHACO  03/01/2011   Procedure: CATARACT EXTRACTION PHACO AND INTRAOCULAR LENS PLACEMENT ([place]);  Surgeon: Tonny Branch;  Location: [facility];  Service: Ophthalmology;  Laterality: Right;  CDE: 15.83   CHOLECYSTECTOMY  2010   DILATION AND CURETTAGE OF UTERUS  1965   ESOPHAGOGASTRODUODENOSCOPY     hemorrhage to bilateral eye     INTRAMEDULLARY (IM) NAIL INTERTROCHANTERIC Right 10/20/2017   Procedure: INTRAMEDULLARY (IM) NAIL INTERTROCHANTRIC FEMORAL NAIL;  Surgeon: Mcarthur Rossetti, MD;  Location: [place];  Service: Orthopedics;  Laterality: Right;   IR THORACENTESIS ASP PLEURAL SPACE W/IMG GUIDE  06/13/2018   PERIPHERAL VASCULAR BALLOON ANGIOPLASTY Right 12/03/2016   Procedure: PERIPHERAL VASCULAR BALLOON ANGIOPLASTY;  Surgeon: Angelia Mould, MD;  Location: [place] [facility];  Service:  Cardiovascular;  Laterality: Right;  arm fistula   ruptured ulcer in stomach  2010   had a feeding tube over a month   SHUNTOGRAM N/A 12/02/2012   Procedure: Fistulagram;  Surgeon: Serafina Mitchell, MD;  Location: [facility];  Service: Cardiovascular;  Laterality: N/A;    Family History  Problem Relation Age of Onset   Diabetes Mother    Cancer Father    Diabetes Daughter    Heart attack Daughter    Pneumonia Daughter    Anesthesia problems Neg Hx    Hypotension Neg Hx    Malignant hyperthermia Neg Hx    Pseudochol deficiency Neg Hx     Social History   Socioeconomic History   Marital status: Married    Spouse name: Not on file   Number of children: Not on file   Years of education: Not on file   Highest education level: Not on file  Occupational History   Not on file  Social Needs   Financial resource strain: Not on file   Food insecurity:    Worry: Not on file    Inability: Not on file   Transportation needs:    Medical: Not on file    Non-medical: Not on  file  Tobacco Use   Smoking status: Never Smoker   Smokeless tobacco: Never Used  Substance and Sexual Activity   Alcohol use: No   Drug use: No   Sexual activity: Yes    Birth control/protection: Surgical  Lifestyle   Physical activity:    Days per week: Not on file    Minutes per session: Not on file   Stress: Not on file  Relationships   Social connections:    Talks on phone: Not on file    Gets together: Not on file    Attends religious service: Not on file    Active member of club or organization: Not on file    Attends meetings of clubs or organizations: Not on file    Relationship status: Not on file   Intimate partner violence:    Fear of current or ex partner: Not on file    Emotionally abused: Not on file    Physically abused: Not on file    Forced sexual activity: Not on file  Other Topics Concern   Not on file  Social History Narrative   Not on file     Prior to Admission medications   Medication Sig Start Date End Date Taking? Authorizing Provider  acetaminophen (TYLENOL) 500 MG tablet Take 500 mg by mouth every 6 (six) hours as needed (for pain.).   Yes [provider]  amLODipine (NORVASC) 2.5 MG tablet TAKE ONE TABLET BY MOUTH ON NON DIALYSIS DAYS. (MONDAY, [place], [place]) Patient taking differently: Take 2.5 mg by mouth See admin instructions. TAKE ONE TABLET BY MOUTH ON NON DIALYSIS DAYS. (MONDAY, WEDNESDAY, [place]) 02/24/18  Yes Herminio Commons, MD  atorvastatin (LIPITOR) 20 MG tablet Take 20 mg by mouth at bedtime.   Yes [provider]  lidocaine-prilocaine (EMLA) cream Apply 1 application topically daily as needed (prior to port being accessed).  02/15/15  Yes [provider]  metoprolol tartrate (LOPRESSOR) 25 MG tablet TAKE 1 AND 1/2 TABLETS BY MOUTH TWICE DAILY. (MORNING,BEDTIME) Patient taking differently: Take 25 mg by mouth 2 (two) times daily.  08/21/17  Yes Branch, Alphonse Guild, MD  oxyCODONE (OXY IR/ROXICODONE) 5 MG immediate release tablet Take 1 tablet (5 mg total) by mouth every 4 (four) hours as needed for severe pain. 10/24/17  Yes Shelly Coss, MD  warfarin (COUMADIN) 2.5 MG tablet Take 1 tablet daily except 1/2 tablet on Saturdays as directed Patient taking differently: Take 1.25-2.5 mg by mouth See admin instructions. Take 2.5MG  by mouth daily except 1.25MG  on saturdays. 12/16/17  Yes BranchAlphonse Guild, MD    Current Facility-Administered Medications  Medication Dose Route Frequency Provider Last Rate Last Dose   acetaminophen (TYLENOL) tablet 650 mg  650 mg Oral Q6H PRN Howerter, Justin B, DO       Or   acetaminophen (TYLENOL) suppository 650 mg  650 mg Rectal Q6H PRN Howerter, Justin B, DO       acetaminophen (TYLENOL) tablet 500 mg  500 mg Oral Q6H PRN Caren Griffins, MD       amiodarone (NEXTERONE PREMIX) 360-4.14 MG/200ML-% (1.8 mg/mL) IV infusion   30 mg/hr Intravenous Continuous Howerter, Justin B, DO 16.67 mL/hr at 06/15/18 0540 30 mg/hr at 06/15/18 0540   Ampicillin-Sulbactam (UNASYN) 3 g in sodium chloride 0.9 % 100 mL IVPB  3 g Intravenous Q12H Carney, Jessica C, RPH       antiseptic oral rinse (BIOTENE) solution 15 mL  15 mL Mouth  Rinse PRN Caren Griffins, MD       atorvastatin (LIPITOR) tablet 20 mg  20 mg Oral QHS Caren Griffins, MD   20 mg at 06/14/18 2145   Chlorhexidine Gluconate Cloth 2 % PADS 6 each  6 each Topical Q0600 Elmarie Shiley, MD   6 each at 06/11/18 1244   collagenase (SANTYL) ointment   Topical Daily Caren Griffins, MD       feeding supplement (NEPRO CARB STEADY) liquid 237 mL  237 mL Oral BID BM Elmarie Shiley, MD   237 mL at 06/15/18 1029   lidocaine (XYLOCAINE) 1 % (with pres) injection   Infiltration PRN Docia Barrier, PA   10 mL at 06/13/18 1703   metoprolol tartrate (LOPRESSOR) tablet 25 mg  25 mg Oral BID Caren Griffins, MD   25 mg at 06/15/18 1029   metroNIDAZOLE (FLAGYL) IVPB 500 mg  500 mg Intravenous Q8H Gonfa, Taye T, MD       multivitamin (RENA-VIT) tablet 1 tablet  1 tablet Oral QHS Elmarie Shiley, MD   1 tablet at 06/14/18 2145   oxyCODONE (Oxy IR/ROXICODONE) immediate release tablet 5 mg  5 mg Oral Q4H PRN Caren Griffins, MD       potassium chloride SA (K-DUR) CR tablet 20 mEq  20 mEq Oral BID Wendee Beavers T, MD   20 mEq at 06/15/18 1029    Allergies  Allergen Reactions   Sulfa Antibiotics Nausea Only      Review of Systems:  Per HPI.  Remainder non-contributory    Physical Exam:   BP 110/70 (BP Location: Left Arm)    Pulse (!) 102    Temp 97.6 F (36.4 C) (Oral)    Resp 20    Ht 5\' 4"  (1.626 m)    Wt 52 kg    SpO2 98%    BMI 19.68 kg/m   General:  Elderly and frail-appearing - NAD  HEENT:  Unremarkable   Neck:   no JVD, no bruits, no adenopathy   Chest:   Diminished breath sounds right base, no wheezes, no rhonchi   CV:   Irregular rate and rhythm, soft  systolic murmur at apex  Abdomen:  soft, non-tender, no masses   Extremities:  warm, well-perfused, pulses not palpable, mild lower extremity edema  Rectal/GU  Deferred  Neuro:   Grossly non-focal and symmetrical throughout  Skin:   Clean and dry, no rashes, no breakdown  Diagnostic Tests:  Lab Results: Recent Labs    06/14/18 0736 06/15/18 0655  WBC 17.2* 19.6*  HGB 9.6* 10.7*  HCT 30.6* 33.1*  PLT 163 134*   BMET:  Recent Labs    06/14/18 0736 06/15/18 0406  NA 136 138  K 4.2 3.6  CL 95* 96*  CO2 24 27  GLUCOSE 105* 95  BUN 36* 19  CREATININE 4.81* 3.00*  CALCIUM 8.1* 7.9*    CBG (last 3)  Recent Labs    06/13/18 1900 06/13/18 2119 06/14/18 2154  GLUCAP 103* 96 98   PT/INR:   Recent Labs    06/15/18 0406  LABPROT 36.0*  INR 3.7*    CXR:  PORTABLE CHEST 1 VIEW  COMPARISON:  06/10/2018 and 03/27/2018.  FINDINGS: Patient rotated to the right. Midline trachea. Mild cardiomegaly. A moderate right pleural effusion with suggestion of loculation superiorly, similar. Tiny left pleural effusion. No pneumothorax. Similar lower lung predominant right-sided airspace disease. The left lung remains clear. Atherosclerosis in the transverse aorta.  Proximal left humerus fracture.  IMPRESSION: No significant change since one day prior.  Moderate right pleural effusion with suggestion of loculation superiorly. Underlying right-sided airspace disease could represent atelectasis and/or infection.  Aortic Atherosclerosis (ICD10-I70.0).  Tiny left pleural effusion.   Electronically Signed   By: Abigail Miyamoto M.D.   On: 06/11/2018 08:22   CT CHEST WITHOUT CONTRAST  TECHNIQUE: Multidetector CT imaging of the chest was performed following the standard protocol without IV contrast.  COMPARISON:  06/11/2018 chest radiograph.  FINDINGS: Cardiovascular: Top-normal heart size. No significant pericardial effusion/thickening. Left main and 3 vessel  coronary atherosclerosis. Atherosclerotic nonaneurysmal thoracic aorta. Dilated main pulmonary artery (3.8 cm diameter).  Mediastinum/Nodes: No discrete thyroid nodules. Unremarkable esophagus. No axillary adenopathy. Right paratracheal adenopathy up to 1.5 cm (series 3/image 57). No discrete hilar adenopathy on this noncontrast scan.  Lungs/Pleura: No pneumothorax. Large loculated right pleural effusion with diffuse irregular right pleural thickening. Small dependent left pleural effusion. No appreciable calcified pleural plaques. Near complete right lung atelectasis with mild residual aeration predominantly in the right upper lobe. Mild compressive atelectasis in the dependent left lower lobe. No discrete lung masses. Solid left upper lobe 8 mm pulmonary nodule (series 5/image 53). No additional significant pulmonary nodules. No central airway stenoses.  Upper abdomen: Liver surface appears slightly irregular, cannot exclude hepatic cirrhosis. Small volume perihepatic ascites.  Musculoskeletal: No aggressive appearing focal osseous lesions. Mild thoracic spondylosis. Extensively comminuted left proximal humerus fracture is partially visualized.  IMPRESSION: 1. Large loculated right pleural effusion with diffuse irregular right pleural thickening. Right pleural empyema or neoplasm cannot be excluded. Suggest diagnostic right thoracentesis. 2. Near complete right lung atelectasis. 3. Small dependent left pleural effusion with mild dependent left lung base atelectasis. No left pleural thickening. 4. Solid 8 mm left upper lobe pulmonary nodule. If evaluation of the right pleural effusion reveals neoplastic etiology, suggested attention to this nodule on follow-up chest CT in 3 months. Otherwise, follow-up chest CT in 6 months recommended. 5. Suggestion of slight liver surface irregularity, cannot exclude appendix cirrhosis. Small volume perihepatic ascites. 6. Dilated main  pulmonary artery, suggesting pulmonary arterial hypertension. 7. Nonspecific mild right paratracheal lymphadenopathy. If evaluation of the right pleural effusion reveals neoplastic etiology, recommend attention on follow-up chest CT with IV contrast in 3 months. 8. Left main and 3 vessel coronary atherosclerosis.  Aortic Atherosclerosis (ICD10-I70.0).   Electronically Signed   By: Ilona Sorrel M.D.   On: 06/13/2018 09:38     ULTRASOUND GUIDED DIAGNOSTIC AND THERAPEUTIC RIGHT THORACENTESIS  MEDICATIONS: 10 mL 1% lidocaine  COMPLICATIONS: None immediate.  PROCEDURE: An ultrasound guided thoracentesis was thoroughly discussed with the patient and questions answered. The benefits, risks, alternatives and complications were also discussed. The patient understands and wishes to proceed with the procedure. Written consent was obtained.  Ultrasound was performed to localize and mark an adequate pocket of fluid in the right chest. The area was then prepped and draped in the normal sterile fashion. 1% Lidocaine was used for local anesthesia. Under ultrasound guidance a 6 Fr Safe-T-Centesis catheter was introduced. Thoracentesis was performed. The catheter was removed and a dressing applied.  FINDINGS: A total of approximately 300 mL of bloody fluid was removed. Samples were sent to the laboratory as requested by the clinical team.  IMPRESSION: Successful ultrasound guided diagnostic and therapeutic right thoracentesis yielding 300 mL of pleural fluid.  Read by: Brynda Greathouse PA-C   Electronically Signed   By: Markus Daft M.D.   On: 06/13/2018  17:12     CHEST  1 VIEW  COMPARISON:  CT 06/13/2018  FINDINGS: Moderately large right pleural effusion. No pneumothorax post thoracentesis. Diffuse airspace disease on the right unchanged  Small left effusion and mild left lower lobe airspace disease.  Fracture proximal left humerus appears subacute with  some callus formation.  IMPRESSION: No pneumothorax post right thoracentesis.   Electronically Signed   By: Franchot Gallo M.D.   On: 06/13/2018 17:43   Results for BRAELYNNE, GARINGER (MRN [id number]) as of 06/15/2018 11:42  Ref. Range 06/13/2018 17:04  Monocyte-Macrophage-Serous Fluid Latest Ref Range: 50 - 90 % 5 (L)  Other Cells, Fluid Latest Units: % 0  Amylase, Fluid Latest Units: U/L 22  Fluid Type-FAMY Unknown Pleural R  Fluid Type-FLDH Unknown Pleural R  LD, Fluid Latest Ref Range: 3 - 23 U/L 410 (H)  Total protein, fluid Latest Units: g/dL <3.0  Fluid Type-FCT Unknown Pleural R  Fluid Type-FTP Unknown Pleural R  Color, Fluid Latest Ref Range: YELLOW  RED (A)  WBC, Fluid Latest Ref Range: 0 - 1,000 cu mm 4,750 (H)  Lymphs, Fluid Latest Units: % 9  Eos, Fluid Latest Units: % 0  Appearance, Fluid Latest Ref Range: CLEAR  TURBID (A)  Neutrophil Count, Fluid Latest Ref Range: 0 - 25 % 86 (H)    Specimen Description FLUID RIGHT PLEURAL   Special Requests BOTTLES DRAWN AEROBIC AND ANAEROBIC   Gram Stain GRAM NEGATIVE RODS  AEROBIC BOTTLE ONLY  CRITICAL RESULT CALLED TO, READ BACK BY AND VERIFIED WITH: E. MONGE, RN CONCERNING GRAM STAIN AT 8469 ON 06/14/18 BY C. JESSUP, MLT  Performed at [facility], [address] [place], [place] [address]   Culture GRAM NEGATIVE RODS   Report Status PENDING       ECHOCARDIOGRAM LIMITED REPORT       Patient Name:   HARTLEY WYKE Date of Exam: 06/11/2018 Medical Rec #:  [id number]     Height:       64.0 in Accession #:    [id number]    Weight:       119.5 lb Date of Birth:  [birth year]     BSA:          1.57 m Patient Age:    64 years      BP:           117/71 mmHg Patient Gender: F             HR:           103 bpm. Exam Location:  Inpatient    Procedure: 2D Echo, Cardiac Doppler and Color Doppler  Indications:    Acute CHF 428.31 / I50.31   History:        Patient has prior history of Echocardiogram examinations,  most                 recent 03/07/2016. End-stage renal disease, Atrial Fibrillation                 with rapid ventricular rate, Diabetes, Hypertension,                 Hyperlipidemia, Atrial Fibrillation.   Sonographer:    Madelaine Etienne RDCS (AE) Referring Phys: [id number] [place]    1. The left ventricle has mild-moderately reduced systolic function, with an ejection fraction of 40-45%. The cavity size was normal. There is mildly increased left ventricular wall thickness. Left ventricular  diastolic Doppler parameters are consistent  with impaired relaxation. Left ventricular diffuse hypokinesis.  2. The right ventricle has normal systolc function. Right ventricular systolic pressure is mildly elevated with an estimated pressure of 41.7 mmHg.  3. The mitral valve is degenerative. Moderate thickening of the mitral valve leaflet. Moderate calcification of the mitral valve leaflet. Mitral valve regurgitation is moderate by color flow Doppler.  4. The aortic valve is tricuspid Moderate thickening of the aortic valve Moderate calcification of the aortic valve.  5. The aortic root is normal in size and structure.  FINDINGS  Left Ventricle: The left ventricle has mild-moderately reduced systolic function, with an ejection fraction of 40-45%. The cavity size was normal. There is mildly increased left ventricular wall thickness. Left ventricular diastolic Doppler parameters  are consistent with impaired relaxation (grade I). Left ventricular diffuse hypokinesis.   Right Ventricle: The right ventricle has normal systolic function. Right ventricular systolic pressure is mildly elevated with an estimated pressure of 41.7 mmHg. Left Atrium: Left atrial size was normal in size. Right Atrium: Right atrial size was normal in size. Right atrial pressure is estimated at 10 mmHg. Pericardium: There is no evidence of pericardial effusion. There is ascites noted. Mitral Valve: The  mitral valve is degenerative in appearance. Moderate thickening of the mitral valve leaflet. Moderate calcification of the mitral valve leaflet. Mitral valve regurgitation is moderate by color flow Doppler. Tricuspid Valve: The tricuspid valve was normal in structure. Tricuspid valve regurgitation is trivial by color flow Doppler. Aortic Valve: The aortic valve is tricuspid Moderate thickening of the aortic valve. Moderate calcification of the aortic valve. There is no evidence of aortic valve stenosis. Pulmonic Valve: The pulmonic valve was normal in structure. Pulmonic valve regurgitation is trivial by color flow Doppler. Aorta: The aortic root is normal in size and structure. Venous: The inferior vena cava is normal in size with greater than 50% respiratory variability.   LEFT VENTRICLE PLAX 2D LVIDd:         4.60 cm LVIDs:         3.20 cm LV PW:         1.20 cm LV IVS:        1.10 cm LV SV:         56 ml LV SV Index:   36.06 LV Volumes (MOD) LV area d, A2C:    25.80 cm LV area d, A4C:    27.70 cm LV area s, A2C:    20.60 cm LV area s, A4C:    17.70 cm LV major d, A2C:   7.29 cm LV major d, A4C:   7.23 cm LV major s, A2C:   6.93 cm LV major s, A4C:   6.37 cm LV vol d, MOD A2C: 76.7 ml LV vol d, MOD A4C: 87.3 ml LV vol s, MOD A2C: 50.7 ml LV vol s, MOD A4C: 45.0 ml LV SV MOD A2C:     26.0 ml LV SV MOD A4C:     87.3 ml LV SV MOD BP:      33.2 ml  LEFT ATRIUM         Index LA diam:    3.60 cm 2.29 cm/m    AORTA Ao Root diam: 3.50 cm Ao Asc diam:  3.10 cm  MITRAL VALVE                 TRICUSPID VALVE MV Area (PHT): 1.54 cm      TR Peak grad:  31.7 mmHg MV PHT:        142.82 msec   TR Vmax:        300.00 cm/s MV Decel Time: 493 msec MR Peak grad:    69.1 mmHg MR Mean grad:    46.0 mmHg MR Vmax:         415.50 cm/s MR Vmean:        321.5 cm/s MR PISA:         1.01 cm MR PISA Eff ROA: 9 mm MR PISA Radius:  0.40 cm MV E velocity: 142.67 cm/s    Candee Furbish  MD Electronically signed by Candee Furbish MD Signature Date/Time: 06/11/2018/2:16:29 PM     Impression:  Patient presented with a large right pleural effusion for which she underwent diagnostic thoracentesis several days ago and pleural fluid culture is reportedly growing gram-negative rods.  I have personally examined the patient and reviewed her blood work, multiple radiographic imaging studies, and transthoracic echocardiogram.  Chest CT scan performed June 13, 2018 revealed a large right pleural effusion, small left pleural effusion, and 8 mm nodule in the left lung.  Initial report suggested that the patient's right pleural effusion may be partially loculated, although I feel there are minimal signs of loculation or fibrosis.  The patient's pleural effusion was not completely drained and only 300 mL was removed at the time of thoracentesis.  Initial portable chest x-ray immediately following thoracentesis revealed persistent pleural effusion but no pneumothorax.  No follow-up imaging has been performed since that time.  The patient has remained afebrile and clinically stable although she has developed progressive leukocytosis with white blood count 19,000 this morning.  She denies fevers, cough, or shortness of breath.  Pleural fluid culture is reportedly growing gram-negative rods, suggesting that the patient likely has empyema.    Plan:  I recommend stopping Coumadin and reversing anticoagulation with anticipation that the patient will need at least chest tube placement for drainage of the right empyema.  I recommend follow-up PA and lateral chest x-ray or noncontrast CT scan of the chest to be performed today.  Depending on findings on repeat imaging, I would favor CT-guided placement of large-bore chest tube as an attempt to adequately drain the patient's pleural effusion.  If pleural fluid cytology was not sent at the time of the patient's previous thoracentesis, pleural fluid cytology should  be requested at the time of chest tube placement.  I would be very reluctant to consider this patient a candidate for surgery.  I discussed matters at length with patient at the bedside this afternoon.  Under the circumstances, I do not feel that it would be wise to proceed with DC cardioversion tomorrow.  All questions answered.  Will continue to follow.   I spent in excess of 90 minutes during the conduct of this hospital consultation and >50% of this time involved direct face-to-face encounter for counseling and/or coordination of the patient's care.    Valentina Gu. Roxy Manns, MD 06/15/2018 11:45 AM    Addendum:  I have personally reviewed the f/u CT scan of the chest performed this afternoon.  There remains a large pleural effusion on the right side.  Although the interpreting radiologist reported that the effusion was "apparently loculated" there are no radiographic features of loculation, such as fibrous stranding.  It is possible that thoracentesis wasn't performed with intent to completely drain the incision.  Given the fact that this patient would be a very poor candidate for VATS or open thoracotomy,  I recommend CT-guided placement of a large bore chest tube by interventional radiology into the posterior/basal right pleural space as soon as INR is low enough to minimize risk of bleeding.  Pleural fluid should be sent for cytology if it wasn't at the time of thoracentesis on 4/17.  Will follow.  Rexene Alberts, MD 06/15/2018 3:31 PM

## 2018-06-15 NOTE — Progress Notes (Addendum)
Progress Note  Patient Name: Vanessa Webb Date of Encounter: 06/15/2018  Primary Cardiologist: Carlyle Dolly, MD   Subjective   Some hallucinations over night Had dialysis yesterday   Inpatient Medications    Scheduled Meds: . atorvastatin  20 mg Oral QHS  . Chlorhexidine Gluconate Cloth  6 each Topical Q0600  . collagenase   Topical Daily  . feeding supplement (NEPRO CARB STEADY)  237 mL Oral BID BM  . metoprolol tartrate  25 mg Oral BID  . multivitamin  1 tablet Oral QHS  . potassium chloride  20 mEq Oral BID  . Warfarin - Pharmacist Dosing Inpatient   Does not apply q1800   Continuous Infusions: . amiodarone 30 mg/hr (06/15/18 0540)  . cefTRIAXone (ROCEPHIN)  IV 2 g (06/14/18 1800)  . magnesium sulfate 1 - 4 g bolus IVPB     PRN Meds: acetaminophen **OR** acetaminophen, acetaminophen, antiseptic oral rinse, diphenoxylate-atropine, lidocaine, oxyCODONE   Vital Signs    Vitals:   06/14/18 1347 06/14/18 2145 06/14/18 2156 06/15/18 0619  BP: 129/76 116/63  110/70  Pulse:  97 (!) 101 (!) 102  Resp:   (!) 29 20  Temp: 97.7 F (36.5 C)  98.5 F (36.9 C) 97.6 F (36.4 C)  TempSrc: Oral  Oral Oral  SpO2: 95%  97% 98%  Weight:    52 kg  Height:        Intake/Output Summary (Last 24 hours) at 06/15/2018 0817 Last data filed at 06/14/2018 1054 Gross per 24 hour  Intake -  Output 272 ml  Net -272 ml   Filed Weights   06/14/18 0710 06/14/18 1133 06/15/18 0619  Weight: 52 kg 51.5 kg 52 kg   Telemetry    Atrial fibrillation with RVR 80-110 - Personally Reviewed  ECG    afib nonspecific ST changes   Physical Exam   Affect appropriate Elderly white female  HEENT: normal Neck supple with no adenopathy JVP normal no bruits no thyromegaly Lungs clear with no wheezing and good diaphragmatic motion Heart:  S1/S2 SEM murmur, no rub, gallop or click PMI normal Abdomen: benighn, BS positve, no tenderness, no AAA no bruit.  No HSM or HJR Distal pulses  intact with no bruits No edema Neuro non-focal Skin warm and dry Fistula RUE   Labs    Chemistry Recent Labs  Lab 06/12/18 0431 06/13/18 0401 06/13/18 2041 06/14/18 0736 06/15/18 0406  NA 132* 136  --  136 138  K 3.9 4.5  --  4.2 3.6  CL 92* 98  --  95* 96*  CO2 23 25  --  24 27  GLUCOSE 102* 109*  --  105* 95  BUN 63* 28*  --  36* 19  CREATININE 6.73* 3.97*  --  4.81* 3.00*  CALCIUM 8.2* 8.1*  --  8.1* 7.9*  PROT 5.8*  --  6.2*  --   --   ALBUMIN 1.8*  --  1.7* 1.5*  --   AST 42*  --  64*  --   --   ALT 23  --  31  --   --   ALKPHOS 74  --  73  --   --   BILITOT 0.9  --  0.7  --   --   GFRNONAA 6* 10*  --  8* 15*  GFRAA 6* 12*  --  10* 17*  ANIONGAP 17* 13  --  17* 15     Hematology Recent Labs  Lab 06/12/18  5621 06/13/18 1809 06/14/18 0736  WBC 10.9* 15.8* 17.2*  RBC 3.85* 4.21 3.59*  HGB 10.3* 11.5* 9.6*  HCT 32.8* 36.0 30.6*  MCV 85.2 85.5 85.2  MCH 26.8 27.3 26.7  MCHC 31.4 31.9 31.4  RDW 16.5* 16.9* 16.9*  PLT 132* 173 163   Cardiac EnzymesNo results for input(s): TROPONINI in the last 168 hours. No results for input(s): TROPIPOC in the last 168 hours.   BNP Recent Labs  Lab 06/11/18 1113  BNP 1,119.1*    DDimer No results for input(s): DDIMER in the last 168 hours.   Radiology    Dg Chest 1 View  Result Date: 06/13/2018 CLINICAL DATA:  Post thoracentesis right EXAM: CHEST  1 VIEW COMPARISON:  CT 06/13/2018 FINDINGS: Moderately large right pleural effusion. No pneumothorax post thoracentesis. Diffuse airspace disease on the right unchanged Small left effusion and mild left lower lobe airspace disease. Fracture proximal left humerus appears subacute with some callus formation. IMPRESSION: No pneumothorax post right thoracentesis. Electronically Signed   By: Franchot Gallo M.D.   On: 06/13/2018 17:43   Ct Chest Wo Contrast  Result Date: 06/13/2018 CLINICAL DATA:  Invasion. End-stage renal disease on hemodialysis. Evaluate pleural effusion.  EXAM: CT CHEST WITHOUT CONTRAST TECHNIQUE: Multidetector CT imaging of the chest was performed following the standard protocol without IV contrast. COMPARISON:  06/11/2018 chest radiograph. FINDINGS: Cardiovascular: Top-normal heart size. No significant pericardial effusion/thickening. Left main and 3 vessel coronary atherosclerosis. Atherosclerotic nonaneurysmal thoracic aorta. Dilated main pulmonary artery (3.8 cm diameter). Mediastinum/Nodes: No discrete thyroid nodules. Unremarkable esophagus. No axillary adenopathy. Right paratracheal adenopathy up to 1.5 cm (series 3/image 57). No discrete hilar adenopathy on this noncontrast scan. Lungs/Pleura: No pneumothorax. Large loculated right pleural effusion with diffuse irregular right pleural thickening. Small dependent left pleural effusion. No appreciable calcified pleural plaques. Near complete right lung atelectasis with mild residual aeration predominantly in the right upper lobe. Mild compressive atelectasis in the dependent left lower lobe. No discrete lung masses. Solid left upper lobe 8 mm pulmonary nodule (series 5/image 53). No additional significant pulmonary nodules. No central airway stenoses. Upper abdomen: Liver surface appears slightly irregular, cannot exclude hepatic cirrhosis. Small volume perihepatic ascites. Musculoskeletal: No aggressive appearing focal osseous lesions. Mild thoracic spondylosis. Extensively comminuted left proximal humerus fracture is partially visualized. IMPRESSION: 1. Large loculated right pleural effusion with diffuse irregular right pleural thickening. Right pleural empyema or neoplasm cannot be excluded. Suggest diagnostic right thoracentesis. 2. Near complete right lung atelectasis. 3. Small dependent left pleural effusion with mild dependent left lung base atelectasis. No left pleural thickening. 4. Solid 8 mm left upper lobe pulmonary nodule. If evaluation of the right pleural effusion reveals neoplastic etiology,  suggested attention to this nodule on follow-up chest CT in 3 months. Otherwise, follow-up chest CT in 6 months recommended. 5. Suggestion of slight liver surface irregularity, cannot exclude appendix cirrhosis. Small volume perihepatic ascites. 6. Dilated main pulmonary artery, suggesting pulmonary arterial hypertension. 7. Nonspecific mild right paratracheal lymphadenopathy. If evaluation of the right pleural effusion reveals neoplastic etiology, recommend attention on follow-up chest CT with IV contrast in 3 months. 8. Left main and 3 vessel coronary atherosclerosis. Aortic Atherosclerosis (ICD10-I70.0). Electronically Signed   By: Ilona Sorrel M.D.   On: 06/13/2018 09:38   Ir Thoracentesis Asp Pleural Space W/img Guide  Result Date: 06/13/2018 INDICATION: Patient with right pleural effusion. Request is made for diagnostic and therapeutic thoracentesis. EXAM: ULTRASOUND GUIDED DIAGNOSTIC AND THERAPEUTIC RIGHT THORACENTESIS MEDICATIONS: 10 mL 1% lidocaine  COMPLICATIONS: None immediate. PROCEDURE: An ultrasound guided thoracentesis was thoroughly discussed with the patient and questions answered. The benefits, risks, alternatives and complications were also discussed. The patient understands and wishes to proceed with the procedure. Written consent was obtained. Ultrasound was performed to localize and mark an adequate pocket of fluid in the right chest. The area was then prepped and draped in the normal sterile fashion. 1% Lidocaine was used for local anesthesia. Under ultrasound guidance a 6 Fr Safe-T-Centesis catheter was introduced. Thoracentesis was performed. The catheter was removed and a dressing applied. FINDINGS: A total of approximately 300 mL of bloody fluid was removed. Samples were sent to the laboratory as requested by the clinical team. IMPRESSION: Successful ultrasound guided diagnostic and therapeutic right thoracentesis yielding 300 mL of pleural fluid. Read by: Brynda Greathouse PA-C  Electronically Signed   By: Markus Daft M.D.   On: 06/13/2018 17:12    Cardiac Studies   Echocardiogram 06/12/2018: 1. The left ventricle has mild-moderately reduced systolic function, with an ejection fraction of 40-45%. The cavity size was normal. There is mildly increased left ventricular wall thickness. Left ventricular diastolic Doppler parameters are consistent with impaired relaxation. Left ventricular diffuse hypokinesis.  2. The right ventricle has normal systolc function. Right ventricular systolic pressure is mildly elevated with an estimated pressure of 41.7 mmHg.  3. The mitral valve is degenerative. Moderate thickening of the mitral valve leaflet. Moderate calcification of the mitral valve leaflet. Mitral valve regurgitation is moderate by color flow Doppler.  4. The aortic valve is tricuspid Moderate thickening of the aortic valve Moderate calcification of the aortic valve.  5. The aortic root is normal in size and structure.  Patient Profile   Ms. Huneke is a 73 y.o. female with a history of paroxysmal atrial fibrillation, chronic anticoagulation therapy with Coumadin, ESRD on hemodialysis (T/Th/Sat), chronic anemia, chronic thrombocytopenia, type 2 diabetes mellitus, hypertension, and hyperlipidemia who is being seen for the evaluation of atrial fibrillation with RVR at the request of Dr. Cruzita Lederer (Internal Medicine).  Assessment & Plan    Paroxysmal Atrial Fibrillation with RVR  Still in afib rate control better INR Rx 3.7  Discussed with Dr Roxy Manns. Patient has empyema And coumadin needs to be reversed will cancel [facility] for am     Acute Combined CHF  - BNP 1119 EF 40-45% volume managed with dialysis post IR thoracentesis restore NSR to add AV synchrony    Pleural Effusion - with empyema and elevated WBC plan per CVTS   Hypertension - Currently well controlled. - Continue current medications.  Hyperlipidemia - Continue home Lipitor 20mg  daily.  ESRD on Hemodialysis  (T/Th/Sat) - Management per Nephrology.  Chronic Anemia and Thrombocytopenia - Likely secondary to ESRD. - Hemoglobin  9.6  today. Baseline appears to be around the 9 to 10 range. - Platelets 163 today. Baseline appears to be anywhere from 80 to 130 range. - Management per primary team.   For questions or updates, please contact [place] Please consult [email or web address] for contact info under Cardiology/STEMI.      Signed, Jenkins Rouge, MD  06/15/2018, 8:17 AM   Pager: [phone]

## 2018-06-15 NOTE — Progress Notes (Signed)
Pharmacy Antibiotic Note  Vanessa Webb is a 73 y.o. female admitted on 06/11/2018 with Parapneumonic pleural effusion growing GNR.  Pharmacy has been consulted for Unasyn dosing.  MD also adding Flagyl.  Pt w/ ESRD on HD TTS.  Plan: Unasyn 3g IV q 12 hrs. F/u cultures, narrow abx as able.  Height: 5\' 4"  (162.6 cm) Weight: 114 lb 10.2 oz (52 kg) IBW/kg (Calculated) : 54.7  Temp (24hrs), Avg:97.9 F (36.6 C), Min:97.6 F (36.4 C), Max:98.5 F (36.9 C)  Recent Labs  Lab 06/11/18 0640 06/12/18 0431 06/13/18 0401 06/13/18 1809 06/14/18 0736 06/15/18 0406 06/15/18 0655  WBC 10.9* 10.9*  --  15.8* 17.2*  --  19.6*  CREATININE 5.91* 6.73* 3.97*  --  4.81* 3.00*  --     Estimated Creatinine Clearance: 13.5 mL/min (A) (by C-G formula based on SCr of 3 mg/dL (H)).    Allergies  Allergen Reactions  . Sulfa Antibiotics Nausea Only    Antimicrobials this admission: Unasyn 4/19 >  Flagyl 4/19 >   Dose adjustments this admission:  Microbiology results: 4/17 Pleural fluid > GNR  Thank you for allowing pharmacy to be a part of this patient's care.  Marguerite Olea, [facility] Clinical Pharmacist Phone [phone]  06/15/2018 10:58 AM

## 2018-06-16 ENCOUNTER — Encounter (HOSPITAL_COMMUNITY): Payer: Self-pay | Admitting: Radiology

## 2018-06-16 ENCOUNTER — Inpatient Hospital Stay (HOSPITAL_COMMUNITY): Payer: Medicare Other

## 2018-06-16 DIAGNOSIS — I4819 Other persistent atrial fibrillation: Principal | ICD-10-CM

## 2018-06-16 DIAGNOSIS — I480 Paroxysmal atrial fibrillation: Secondary | ICD-10-CM

## 2018-06-16 LAB — PROTIME-INR
INR: 2.1 — ABNORMAL HIGH (ref 0.8–1.2)
INR: 1.9 — ABNORMAL HIGH (ref 0.8–1.2)
Prothrombin Time: 23.7 seconds — ABNORMAL HIGH (ref 11.4–15.2)
Prothrombin Time: 21.4 seconds — ABNORMAL HIGH (ref 11.4–15.2)

## 2018-06-16 LAB — CBC
HCT: 32.8 % — ABNORMAL LOW (ref 36.0–46.0)
Hemoglobin: 10 g/dL — ABNORMAL LOW (ref 12.0–15.0)
MCH: 26.7 pg (ref 26.0–34.0)
MCHC: 30.5 g/dL (ref 30.0–36.0)
MCV: 87.7 fL (ref 80.0–100.0)
Platelets: 139 10*3/uL — ABNORMAL LOW (ref 150–400)
RBC: 3.74 MIL/uL — ABNORMAL LOW (ref 3.87–5.11)
RDW: 17.6 % — ABNORMAL HIGH (ref 11.5–15.5)
WBC: 15.7 10*3/uL — ABNORMAL HIGH (ref 4.0–10.5)
nRBC: 0 % (ref 0.0–0.2)

## 2018-06-16 LAB — RENAL FUNCTION PANEL
Albumin: 1.6 g/dL — ABNORMAL LOW (ref 3.5–5.0)
Anion gap: 19 — ABNORMAL HIGH (ref 5–15)
BUN: 26 mg/dL — ABNORMAL HIGH (ref 8–23)
CO2: 23 mmol/L (ref 22–32)
Calcium: 8 mg/dL — ABNORMAL LOW (ref 8.9–10.3)
Chloride: 96 mmol/L — ABNORMAL LOW (ref 98–111)
Creatinine, Ser: 3.91 mg/dL — ABNORMAL HIGH (ref 0.44–1.00)
GFR calc Af Amer: 12 mL/min — ABNORMAL LOW (ref >60)
GFR calc non Af Amer: 11 mL/min — ABNORMAL LOW (ref >60)
Glucose, Bld: 120 mg/dL — ABNORMAL HIGH (ref 70–99)
Phosphorus: 4.1 mg/dL (ref 2.5–4.6)
Potassium: 4.4 mmol/L (ref 3.5–5.1)
Sodium: 138 mmol/L (ref 135–145)

## 2018-06-16 LAB — GLUCOSE, CAPILLARY
Glucose-Capillary: 109 mg/dL — ABNORMAL HIGH (ref 70–99)
Glucose-Capillary: 122 mg/dL — ABNORMAL HIGH (ref 70–99)
Glucose-Capillary: 112 mg/dL — ABNORMAL HIGH (ref 70–99)

## 2018-06-16 LAB — PH, BODY FLUID: pH, Body Fluid: 7.2

## 2018-06-16 LAB — MAGNESIUM: Magnesium: 2 mg/dL (ref 1.7–2.4)

## 2018-06-16 MED ORDER — MIDAZOLAM HCL 2 MG/2ML IJ SOLN
INTRAMUSCULAR | Status: AC
  Filled 2018-06-16: qty 2

## 2018-06-16 MED ORDER — SODIUM CHLORIDE 0.9 % IV SOLN
2.0000 g | Freq: Once | INTRAVENOUS | Status: AC
  Administered 2018-06-16: 13:00:00 2 g via INTRAVENOUS
  Filled 2018-06-16: qty 2

## 2018-06-16 MED ORDER — AMIODARONE HCL 200 MG PO TABS
200.0000 mg | ORAL_TABLET | Freq: Two times a day (BID) | ORAL | Status: DC
  Administered 2018-06-16 – 2018-06-27 (×22): 200 mg via ORAL
  Filled 2018-06-16 (×22): qty 1

## 2018-06-16 MED ORDER — VITAMIN K1 10 MG/ML IJ SOLN
5.0000 mg | Freq: Once | INTRAVENOUS | Status: AC
  Administered 2018-06-16: 5 mg via INTRAVENOUS
  Filled 2018-06-16: qty 0.5

## 2018-06-16 MED ORDER — SODIUM CHLORIDE 0.9 % IV SOLN
2.0000 g | INTRAVENOUS | Status: DC
  Administered 2018-06-17 – 2018-06-19 (×2): 2 g via INTRAVENOUS
  Filled 2018-06-16 (×3): qty 2

## 2018-06-16 MED ORDER — MIDAZOLAM HCL 2 MG/2ML IJ SOLN
INTRAMUSCULAR | Status: AC | PRN
  Administered 2018-06-16: 1 mg via INTRAVENOUS

## 2018-06-16 MED ORDER — FENTANYL CITRATE (PF) 100 MCG/2ML IJ SOLN
INTRAMUSCULAR | Status: AC | PRN
  Administered 2018-06-16: 50 ug via INTRAVENOUS

## 2018-06-16 MED ORDER — FENTANYL CITRATE (PF) 100 MCG/2ML IJ SOLN
INTRAMUSCULAR | Status: AC
  Filled 2018-06-16: qty 2

## 2018-06-16 MED ORDER — CHLORHEXIDINE GLUCONATE CLOTH 2 % EX PADS
6.0000 | MEDICATED_PAD | Freq: Every day | CUTANEOUS | Status: DC
  Administered 2018-06-18: 06:00:00 6 via TOPICAL

## 2018-06-16 NOTE — Procedures (Signed)
Interventional Radiology Procedure:   Indications: Infected right pleural effusion  Procedure: CT guided right chest tube placement  Findings: Complex right pleural effusion, placement of 14 Fr tube  Complications: None     EBL: Minimal, less than 10 ml  Plan: Chest tube to PleurEvac and wall suction.     Vanessa Highley R. Anselm Pancoast, MD  Pager: [phone]

## 2018-06-16 NOTE — Progress Notes (Signed)
Patient ID: Vanessa Webb, female   DOB: October 13, 1945, 73 y.o.   MRN: [id number] [facility] Progress Note   Assessment/ Plan:   1.  Atrial fibrillation with rapid ventricular response:  s/p cardioversion today   2.  End-stage renal disease: Continue TTS HD; next tx 4/21  3.  Hyponatremia: improved with HD/UF, advised to limit intake.  4.  Anemia: Hemoglobin and hematocrit currently within acceptable range, will continue to follow trend to decide on need for ESA therapy.  5.  Secondary hyperparathyroidism: We will follow calcium/phosphorus levels. Stable   6.  Protein calorie malnutrition: Significant hypoalbuminemia noted most possibly arising from nonhealing leg ulcer/acute phase reactant.  Continue renal vitamin/ONS.  7.  Hallucinations: off of Lomotil and metronidazole   Subjective:   S/p cardioversion earlier today.  Feels well.  Not sure how much they normally take off at HD.  Goes to [employer].   Review of systems:  Denies shortness of breath or chest pain  Denies nausea or vomiting    Objective:   BP 111/65 (BP Location: Left Arm)   Pulse 98   Temp 98.1 F (36.7 C) (Oral)   Resp 20   Ht 5\' 4"  (1.626 m)   Wt 52.3 kg   SpO2 99%   BMI 19.79 kg/m   Physical Exam: Gen: adult female in bed in NAD CVS: S1S2; no rub Resp: clear to auscultation bilaterally; normal work of breathing Abd: Soft, flat, nontender Ext: no lower extremity edema; right AVF  Labs: BMET Recent Labs  Lab 06/11/18 0640 06/12/18 0431 06/13/18 0401 06/14/18 0736 06/15/18 0406 06/16/18 0537  NA 133* 132* 136 136 138 138  K 4.0 3.9 4.5 4.2 3.6 4.4  CL 93* 92* 98 95* 96* 96*  CO2 23 23 25 24 27 23   GLUCOSE 127* 102* 109* 105* 95 120*  BUN 55* 63* 28* 36* 19 26*  CREATININE 5.91* 6.73* 3.97* 4.81* 3.00* 3.91*  CALCIUM 8.5* 8.2* 8.1* 8.1* 7.9* 8.0*  PHOS  --   --   --  4.1  --  4.1   CBC Recent Labs  Lab 06/13/18 1809 06/14/18 0736 06/15/18 0655 06/16/18 0537  WBC  15.8* 17.2* 19.6* 15.7*  HGB 11.5* 9.6* 10.7* 10.0*  HCT 36.0 30.6* 33.1* 32.8*  MCV 85.5 85.2 83.8 87.7  PLT 173 163 134* 139*   Medications:    . atorvastatin  20 mg Oral QHS  . Chlorhexidine Gluconate Cloth  6 each Topical Q0600  . collagenase   Topical Daily  . feeding supplement (NEPRO CARB STEADY)  237 mL Oral BID BM  . fentaNYL      . metoprolol tartrate  25 mg Oral BID  . midazolam      . multivitamin  1 tablet Oral QHS  . potassium chloride  20 mEq Oral BID    Claudia Desanctis 06/16/2018 2:59 pm

## 2018-06-16 NOTE — Progress Notes (Signed)
Pharmacy Antibiotic Note  Vanessa Webb is a 73 y.o. female admitted on 06/11/2018 with pseudomonal empyema.  Pharmacy was consulted for Unasyn dosing.  Spoke to Dr. Cyndia Skeeters and will begin cefepime and discontinue unasyn and flagyl.  Pt w/ ESRD on HD TTS.  Plan: -Cefepime 2gm IV now then TTS with HD -Will follow patient progress  Height: 5\' 4"  (162.6 cm) Weight: 115 lb 4.8 oz (52.3 kg) IBW/kg (Calculated) : 54.7  Temp (24hrs), Avg:98 F (36.7 C), Min:97.9 F (36.6 C), Max:98.1 F (36.7 C)  Recent Labs  Lab 06/12/18 0431 06/13/18 0401 06/13/18 1809 06/14/18 0736 06/15/18 0406 06/15/18 0655 06/16/18 0537  WBC 10.9*  --  15.8* 17.2*  --  19.6* 15.7*  CREATININE 6.73* 3.97*  --  4.81* 3.00*  --  3.91*    Estimated Creatinine Clearance: 10.4 mL/min (A) (by C-G formula based on SCr of 3.91 mg/dL (H)).    Allergies  Allergen Reactions  . Sulfa Antibiotics Nausea Only    Antimicrobials this admission: Cefepime 4/20>> Unasyn 4/19 > 4/20 Flagyl 4/19 > 4/10  Dose adjustments this admission:  Microbiology results: 4/17 Pleural fluid > pseudomonas (pan-S)  Thank you for allowing pharmacy to be a part of this patient's care.  Hildred Laser, PharmD Clinical Pharmacist **Pharmacist phone directory can now be found on [email or web address] ([email or web address]).  Listed under [place].

## 2018-06-16 NOTE — Progress Notes (Signed)
PROGRESS NOTE  Vanessa Webb DPO:[id number] DOB: Oct 14, 1945 DOA: 06/11/2018 PCP: Monico Blitz, MD   LOS: 4 days   Brief Narrative / Interim history: 73 year old female with history of ESRD on HD TTS, hypertension, hyperlipidemia, A. fib on Coumadin for anticoagulation and chronic right leg ulcer transferred from [facility] for hemodialysis.  Patient presented to [facility] with 2 weeks of generalized weakness and found to be in A. fib with RVR.  She was placed on diltiazem drip transition to amiodarone prior to transfer. Patient is dialysis on 4/14 due to weakness.  Reports good compliance with her metoprolol.  Off note, patient has had poor appetite, generalized weakness and about 40 pound weight loss over the last 1 year.  She fell and broke her right hip 6 months ago and underwent IM nailing.  She also fell and broke her left shoulder 3 months ago on the way to dialysis that has been treated conservatively.  Patient remained in A. fib with RVR on amiodarone drip. Initially, plan for DCCV if heart rate is not under good control.  This has changed after talking to CTS about the pleural effusion we took the precedence, and her heart also improved.  Large right-sided pleural effusion noted on chest x-ray and CT chest.  These appear to be chronic. She had US-guided diagnostic and therapeutic right thoracocentesis on 4/17 that yielded 300 cc exudative fluid by LDH.  Pleural fluid Gram stain and culture with GNR.  AFB smear negative.  AFB culture and cytology pending.  Started on Unasyn and Flagyl.  Cardiothoracic surgery consulted on 4/19 recommended stopping warfarin, INR reversal and chest tube placement by IR.  IR consulted on 06/15/2018.   Subjective: No major events overnight of this morning.  Reports some anxiety and difficulty sleeping.  Denies chest pain, dyspnea or abdominal pain.  Understands about the plan today.  She asks me to update her husband.  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  Gram stain and cultures grew Pseudomonas. AFB smears Gram stain negative.  AFB culture and cytology pending.  Patient without fever but leukocytosis.  CTS consulted on 4/19 and recommend repeat CT chest that showed persistent reportedly loculated large pleural effusion on the right side.  CTS reviewed the CT chest and does not think the effusion is loculated, and recommended chest tube placement by IR after INR reversal.  Per CTS, patient is poor candidate for VATS or open thoracotomy. -Ceftriaxone 4/18-4/19 -Unasyn and Flagyl 4/19--4/20 -Cefepime 4/20--for pseudomonal coverage -Appreciate CTS recommendations -IR consulted-waiting on INR to be lower before chest tube.  Received IV vitamin K 10 mg x 2 yesterday.  Will give another 5 mg today.  Recheck INR.  -Follow pleural fluid AFB culture and cytology from 4/17.  Paroxysmal atrial fibrillation with RVR: RVR improved.  Heart rate in 90s to 100s this morning.  Chads vascular score 5.  Has bled score 3.  Echo as below.  Thyroid panel within normal range. -Remains  on amiodarone drip and home metoprolol per cardiology. -Warfarin on hold.  INR reversal with vitamin K as above -Closely monitor electrolytes and replenish -We will discuss about long-term anticoagulation with low-dose Eliquis versus warfarin -Plan for possible DCCV in 3 weeks after chest tube  Acute on chronic combined  CHF: BNP elevated but difficult to interpret in the setting of ESRD.  Likely due to missed dialysis A. Fib. Echo this admission showed LVEF of 40-45% (down from 55-60% in 02/2016) with diffuse hypokinesis. Reduced EF likely due to RVR.  -Volume managed by HD  Leukocytosis: Multiple potential sources including possible parapneumonic pleural effusion, pressure injury of the skin, RLE ulcer and stress.  Pressure skin injury and RLE ulcer appears to be clean.  Improving. -Antibiotic as above -Manage pleural effusion as above -Continue trending.  Pulmonary nodule/mild right paratracheal lymphadenopathy: 8 mm LUL pulmonary nodule noted on CT chest. -Outpatient CT follow-up in 3 to 6 months for the nodule  Recurrent fall/right hip fracture/comminuted left proximal humerus fracture -Status post IM nailing of right hip 6 months ago -Left shoulder being managed conservatively  Hypertension: Normotensive. -Manage as above.  ESRD on HD TTS/anemia of chronic disease/mineral bone disease -Nephrology managing.  Right lower extremity wound: followed by wound care outpatient.  Reportedly improving per patient. -Appreciate wound care input -Outpatient follow-up.  Well-controlled DM-2: A1c 5.9%.  CBG within normal range. -Discontinued CBG monitoring  RLE wound: POA-no signs of infection.  -lateral 12cm x 1.5cm x 0.2cm; 90% black soft eschar -Medial 4cm x 2.0cm x 0.2cm; 50% black soft eschar -Appreciate wound care input-cnzymatic debridement ointment to the LE wounds, wet-to-dry dressing            Stage III sacral wound: POA.  0.4cm x 0.2cm x 0.1cm; scabbed.  No drainage -Enzymatic debridement ointment to the sacrum  Scheduled Meds:  atorvastatin  20 mg Oral QHS   Chlorhexidine Gluconate Cloth  6 each Topical Q0600   collagenase   Topical Daily   feeding supplement (NEPRO CARB STEADY)  237 mL Oral BID BM   fentaNYL       metoprolol tartrate  25 mg Oral BID   midazolam        multivitamin  1 tablet Oral QHS   potassium chloride  20 mEq Oral BID   Continuous Infusions:  amiodarone 30 mg/hr (06/16/18 0523)   ceFEPime (MAXIPIME) IV     [START ON 06/17/2018] ceFEPime (MAXIPIME) IV     PRN Meds:.acetaminophen **OR** acetaminophen, acetaminophen, antiseptic oral rinse, oxyCODONE  DVT prophylaxis: Off Coumadin for procedure. Code Status: DNR Family Communication: None at bedside.  Updated patient's husband over the phone on 4/20  Disposition Plan: Remains inpatient  Consultants:   Nephrology  Cardiology  Cardiothoracic surgery  IR  Procedures:   HD on 4/16  IR thoracocentesis on 4/17  Antimicrobials:  None  Objective: Vitals:   06/16/18 1052 06/16/18 1105 06/16/18 1110 06/16/18 1115  BP: 111/64 115/67 98/64 111/65  Pulse: 89 99 90 98  Resp: 20 (!) 22 20 20   Temp:      TempSrc:      SpO2: 96% 100% 99% 99%  Weight:      Height:       No intake or output data in the 24 hours ending 06/16/18 1140 Filed Weights   06/14/18 1133 06/15/18 0619 06/16/18 0654  Weight: 51.5 kg 52 kg 52.3 kg    Examination:  GENERAL: Chronically ill-appearing elderly female.  No acute distress.  HEENT: MMM.  Vision and Hearing grossly intact.  NECK: Supple.  No JVD.  LUNGS:  No IWOB.  On RA.  Diminished aeration over RLL. HEART: Heart rate in 100s.  Irregular.  Heart sounds normal. ABD: Bowel sounds present. Soft. Non tender.  EXT:   no edema bilaterally.  Moves all extremities.  SKIN: No edema bilaterally.  Dry and clean dressing over RLE.  No surrounding skin erythema or drainage. NEURO: Awake, alert and oriented appropriately.  No gross deficit.  PSYCH: Calm. Normal affect.  Data Reviewed: I have independently reviewed following labs and imaging studies  CBC: Recent Labs  Lab 06/12/18 0431 06/13/18 1809 06/14/18 0736 06/15/18 0655 06/16/18 0537  WBC 10.9* 15.8* 17.2* 19.6* 15.7*  HGB 10.3* 11.5* 9.6* 10.7* 10.0*  HCT 32.8* 36.0 30.6* 33.1*  32.8*  MCV 85.2 85.5 85.2 83.8 87.7  PLT 132* 173 163 134* 196*   Basic Metabolic Panel: Recent Labs  Lab 06/12/18 0431 06/13/18 0401 06/14/18 0736 06/15/18 0406 06/16/18 0537  NA 132* 136 136 138 138  K 3.9 4.5 4.2 3.6 4.4  CL 92* 98 95* 96* 96*  CO2 23 25 24 27 23   GLUCOSE 102* 109* 105* 95 120*  BUN 63* 28* 36* 19 26*  CREATININE 6.73* 3.97* 4.81* 3.00* 3.91*  CALCIUM 8.2* 8.1* 8.1* 7.9* 8.0*  MG 2.0 1.9 2.0 1.8 2.0  PHOS  --   --  4.1  --  4.1   GFR: Estimated Creatinine Clearance: 10.4 mL/min (A) (by C-G formula based on SCr of 3.91 mg/dL (H)). Liver Function Tests: Recent Labs  Lab 06/12/18 0431 06/13/18 2041 06/14/18 0736 06/16/18 0537  AST 42* 64*  --   --   ALT 23 31  --   --   ALKPHOS 74 73  --   --   BILITOT 0.9 0.7  --   --   PROT 5.8* 6.2*  --   --   ALBUMIN 1.8* 1.7* 1.5* 1.6*   No results for input(s): LIPASE, AMYLASE in the last 168 hours. No results for input(s): AMMONIA in the last 168 hours. Coagulation Profile: Recent Labs  Lab 06/12/18 0431 06/13/18 0401 06/14/18 0921 06/15/18 0406 06/16/18 0537  INR 4.0* 3.7* 4.0* 3.7* 2.1*   Cardiac Enzymes: No results for input(s): CKTOTAL, CKMB, CKMBINDEX, TROPONINI in the last 168 hours. BNP (last 3 results) No results for input(s): PROBNP in the last 8760 hours. HbA1C: No results for input(s): HGBA1C in the last 72 hours. CBG: Recent Labs  Lab 06/13/18 2119 06/14/18 2154 06/15/18 1625 06/15/18 2147 06/16/18 0753  GLUCAP 96 98 94 119* 109*   Lipid Profile: No results for input(s): CHOL, HDL, LDLCALC, TRIG, CHOLHDL, LDLDIRECT in the last 72 hours. Thyroid Function Tests: No results for input(s): TSH, T4TOTAL, FREET4, T3FREE, THYROIDAB in the last 72 hours. Anemia Panel: No results for input(s): VITAMINB12, FOLATE, FERRITIN, TIBC, IRON, RETICCTPCT in the last 72 hours. Urine analysis:    Component Value Date/Time   COLORURINE YELLOW 01/21/2009 1719   APPEARANCEUR CLOUDY (A)  01/21/2009 1719   LABSPEC 1.021 01/21/2009 1719   PHURINE 5.0 01/21/2009 1719   GLUCOSEU NEGATIVE 01/21/2009 1719   HGBUR TRACE (A) 01/21/2009 1719   BILIRUBINUR MODERATE (A) 01/21/2009 1719   KETONESUR 15 (A) 01/21/2009 1719   PROTEINUR 30 (A) 01/21/2009 1719   UROBILINOGEN 1.0 01/21/2009 1719   NITRITE POSITIVE (A) 01/21/2009 1719   LEUKOCYTESUR MODERATE (A) 01/21/2009 1719   Sepsis Labs: Invalid input(s): PROCALCITONIN, LACTICIDVEN  Recent Results (from the  past 240 hour(s))  MRSA PCR Screening     Status: None   Collection Time: 06/11/18  6:23 AM  Result Value Ref Range Status   MRSA by PCR NEGATIVE NEGATIVE Final    Comment:        The GeneXpert MRSA Assay (FDA approved for NASAL specimens only), is one component of a comprehensive MRSA colonization surveillance program. It is not intended to diagnose MRSA infection nor to guide or monitor treatment for MRSA infections. Performed at [facility], [place] [address] [place], [place] [address]   Gram stain     Status: None   Collection Time: 06/13/18  5:04 PM  Result Value Ref Range Status   Specimen Description FLUID RIGHT PLEURAL  Final   Special Requests NONE  Final   Gram Stain   Final    ABUNDANT WBC PRESENT,BOTH PMN AND MONONUCLEAR NO ORGANISMS SEEN Performed at [facility], [address] [place], [place] [address]    Report Status 06/13/2018 FINAL  Final  Acid Fast Smear (AFB)     Status: None   Collection Time: 06/13/18  5:04 PM  Result Value Ref Range Status   AFB Specimen Processing Concentration  Final   Acid Fast Smear Negative  Final    Comment: (NOTE) Performed At: [facility] [place], [place] [id number] Rush Farmer MD OF:[id number]    Source (AFB) FLUID  Final    Comment: PLEURAL RIGHT Performed at [facility], [place] [address], [place], [place] [address]   Culture, body fluid-bottle     Status: Abnormal (Preliminary result)   Collection Time:  06/13/18  5:04 PM  Result Value Ref Range Status   Specimen Description FLUID RIGHT PLEURAL  Final   Special Requests BOTTLES DRAWN AEROBIC AND ANAEROBIC  Final   Gram Stain   Final    GRAM NEGATIVE RODS AEROBIC BOTTLE ONLY CRITICAL RESULT CALLED TO, READ BACK BY AND VERIFIED WITH: E. MONGE, RN CONCERNING GRAM STAIN AT 3536 ON 06/14/18 BY C. JESSUP, MLT    Culture PSEUDOMONAS AERUGINOSA (A)  Final   Report Status PENDING  Incomplete   Organism ID, Bacteria PSEUDOMONAS AERUGINOSA  Final      Susceptibility   Pseudomonas aeruginosa - MIC*    CEFTAZIDIME 4 SENSITIVE Sensitive     CIPROFLOXACIN <=0.25 SENSITIVE Sensitive     GENTAMICIN <=1 SENSITIVE Sensitive     IMIPENEM 2 SENSITIVE Sensitive     PIP/TAZO 16 SENSITIVE Sensitive     CEFEPIME Value in next row Sensitive      <=1 SENSITIVEPerformed at [place] [address] [place], [place] [address]    * PSEUDOMONAS AERUGINOSA      Radiology Studies: Ct Chest Wo Contrast  Result Date: 06/15/2018 CLINICAL DATA:  Bilateral pleural effusions. EXAM: CT CHEST WITHOUT CONTRAST TECHNIQUE: Multidetector CT imaging of the chest was performed following the standard protocol without IV contrast. COMPARISON:  06/13/2018 FINDINGS: Cardiovascular: The heart size is normal. No substantial pericardial effusion. Coronary artery calcification is evident. Atherosclerotic calcification is noted in the wall of the thoracic aorta. Prominence of the main pulmonary arteries suggests pulmonary arterial hypertension. Mediastinum/Nodes: Upper normal mediastinal lymph nodes are again noted. Assessment right hilum limited by lack of intravenous contrast and adjacent collapse/consolidation. No gross bulky left hilar lymphadenopathy. There is no axillary lymphadenopathy. Gas and residual contrast material in the esophagus may be related to reflux or dysmotility. Lungs/Pleura: The central tracheobronchial airways are patent.  Collapse/consolidation of the right  middle and lower lobes is similar to prior. 9 mm left upper lobe pulmonary nodule is similar to prior. Dependent left lower lobe atelectasis is stable. Large right pleural effusion is stable to potentially minimally decreased in the interval. The diffuse apparent pleural thickening in the right hemithorax is similar to prior. Small left pleural effusion is stable to minimally progressed. Upper Abdomen: Nodular contour of the liver suggest cirrhosis. Musculoskeletal: Heterogeneous mineralization of bony anatomy is unchanged. Comminuted proximal left humerus fracture again noted. IMPRESSION: 1. No substantial interval change in the large apparently loculated right pleural effusion with associated pleural thickening in the right hemithorax. 2. Similar appearance of the small dependent left lower lobe effusion. 3. Marked collapse/consolidation of the right middle and lower lobes with some dependent atelectasis in the left base. 4. Similar appearance 8-9 mm left upper lobe pulmonary nodule. 5. Prominence of the main pulmonary arteries suggests pulmonary arterial hypertension. 6. Nodular hepatic contour suggests cirrhosis. 7.  Aortic Atherosclerois (ICD10-170.0) Electronically Signed   By: Misty Stanley M.D.   On: 06/15/2018 15:20    [facility] Pager [phone]  If 7PM-7AM, please contact night-coverage [email or web address] Password [email or web address] 06/16/2018, 11:40 AM

## 2018-06-16 NOTE — Progress Notes (Signed)
Progress Note  Patient Name: Vanessa Webb Date of Encounter: 06/16/2018  Primary Cardiologist: Carlyle Dolly, MD   Subjective   No dyspnea or CP  Inpatient Medications    Scheduled Meds: . atorvastatin  20 mg Oral QHS  . Chlorhexidine Gluconate Cloth  6 each Topical Q0600  . collagenase   Topical Daily  . feeding supplement (NEPRO CARB STEADY)  237 mL Oral BID BM  . metoprolol tartrate  25 mg Oral BID  . multivitamin  1 tablet Oral QHS  . potassium chloride  20 mEq Oral BID   Continuous Infusions: . amiodarone 30 mg/hr (06/16/18 0523)  . ampicillin-sulbactam (UNASYN) IV 3 g (06/15/18 2237)  . metronidazole 500 mg (06/16/18 0327)  . phytonadione (VITAMIN K) IV 10 mg (06/16/18 0706)   PRN Meds: acetaminophen **OR** acetaminophen, acetaminophen, antiseptic oral rinse, lidocaine, oxyCODONE   Vital Signs    Vitals:   06/15/18 0619 06/15/18 1449 06/15/18 2144 06/16/18 0654  BP: 110/70 123/64 125/67   Pulse: (!) 102 98 (!) 111   Resp: 20 (!) 22 (!) 21   Temp: 97.6 F (36.4 C) 97.9 F (36.6 C) 98.1 F (36.7 C)   TempSrc: Oral Oral Oral   SpO2: 98% 100% 96%   Weight: 52 kg   52.3 kg  Height:       No intake or output data in the 24 hours ending 06/16/18 0731 Last 3 Weights 06/16/2018 06/15/2018 06/14/2018  Weight (lbs) 115 lb 4.8 oz 114 lb 10.2 oz 113 lb 8.6 oz  Weight (kg) 52.3 kg 52 kg 51.5 kg      Telemetry    Atrial fibrillation with PVCs or aberrantly conducted beats- Personally Reviewed  Physical Exam   GEN: No acute distress.   Neck: No JVD Cardiac: irregular Respiratory: Diminished BS RLL GI: Soft, nontender, non-distended  MS: No edema Neuro:  Nonfocal  Psych: Normal affect   Labs    Chemistry Recent Labs  Lab 06/12/18 0431 06/13/18 0401 06/13/18 2041 06/14/18 0736 06/15/18 0406  NA 132* 136  --  136 138  K 3.9 4.5  --  4.2 3.6  CL 92* 98  --  95* 96*  CO2 23 25  --  24 27  GLUCOSE 102* 109*  --  105* 95  BUN 63* 28*  --  36* 19   CREATININE 6.73* 3.97*  --  4.81* 3.00*  CALCIUM 8.2* 8.1*  --  8.1* 7.9*  PROT 5.8*  --  6.2*  --   --   ALBUMIN 1.8*  --  1.7* 1.5*  --   AST 42*  --  64*  --   --   ALT 23  --  31  --   --   ALKPHOS 74  --  73  --   --   BILITOT 0.9  --  0.7  --   --   GFRNONAA 6* 10*  --  8* 15*  GFRAA 6* 12*  --  10* 17*  ANIONGAP 17* 13  --  17* 15     Hematology Recent Labs  Lab 06/13/18 1809 06/14/18 0736 06/15/18 0655  WBC 15.8* 17.2* 19.6*  RBC 4.21 3.59* 3.95  HGB 11.5* 9.6* 10.7*  HCT 36.0 30.6* 33.1*  MCV 85.5 85.2 83.8  MCH 27.3 26.7 27.1  MCHC 31.9 31.4 32.3  RDW 16.9* 16.9* 17.0*  PLT 173 163 134*     BNP Recent Labs  Lab 06/11/18 1113  BNP 1,119.1*  Radiology    Ct Chest Wo Contrast  Result Date: 06/15/2018 CLINICAL DATA:  Bilateral pleural effusions. EXAM: CT CHEST WITHOUT CONTRAST TECHNIQUE: Multidetector CT imaging of the chest was performed following the standard protocol without IV contrast. COMPARISON:  06/13/2018 FINDINGS: Cardiovascular: The heart size is normal. No substantial pericardial effusion. Coronary artery calcification is evident. Atherosclerotic calcification is noted in the wall of the thoracic aorta. Prominence of the main pulmonary arteries suggests pulmonary arterial hypertension. Mediastinum/Nodes: Upper normal mediastinal lymph nodes are again noted. Assessment right hilum limited by lack of intravenous contrast and adjacent collapse/consolidation. No gross bulky left hilar lymphadenopathy. There is no axillary lymphadenopathy. Gas and residual contrast material in the esophagus may be related to reflux or dysmotility. Lungs/Pleura: The central tracheobronchial airways are patent. Collapse/consolidation of the right middle and lower lobes is similar to prior. 9 mm left upper lobe pulmonary nodule is similar to prior. Dependent left lower lobe atelectasis is stable. Large right pleural effusion is stable to potentially minimally decreased in the  interval. The diffuse apparent pleural thickening in the right hemithorax is similar to prior. Small left pleural effusion is stable to minimally progressed. Upper Abdomen: Nodular contour of the liver suggest cirrhosis. Musculoskeletal: Heterogeneous mineralization of bony anatomy is unchanged. Comminuted proximal left humerus fracture again noted. IMPRESSION: 1. No substantial interval change in the large apparently loculated right pleural effusion with associated pleural thickening in the right hemithorax. 2. Similar appearance of the small dependent left lower lobe effusion. 3. Marked collapse/consolidation of the right middle and lower lobes with some dependent atelectasis in the left base. 4. Similar appearance 8-9 mm left upper lobe pulmonary nodule. 5. Prominence of the main pulmonary arteries suggests pulmonary arterial hypertension. 6. Nodular hepatic contour suggests cirrhosis. 7.  Aortic Atherosclerois (ICD10-170.0) Electronically Signed   By: Misty Stanley M.D.   On: 06/15/2018 15:20    Patient Profile     Vanessa Webb is a 73 y.o. female with a history of paroxysmal atrial fibrillation, chronic anticoagulation therapy with Coumadin, ESRD on hemodialysis (T/Th/Sat), chronic anemia, chronicthrombocytopenia,type 2 diabetes mellitus, hypertension, and hyperlipidemiawho is being seen for the evaluation ofatrial fibrillation with RVRat the request of Dr. Cruzita Lederer (Internal Medicine). Also with empyema.  Assessment & Plan    1 persistent atrial fibrillation-heart rate is reasonably well controlled.  Continue amiodarone at present dose as well as low-dose metoprolol.  Will convert amiodarone to oral form following chest tube placement. Coumadin has been discontinued in anticipation of chest tube placement for empyema.  Will resume after all procedures complete and plan cardioversion 3 weeks after INR therapeutic.  2 pleural effusion-empyema.  Plan is for chest tube placement today if INR improved.   3 end-stage renal disease-dialysis per nephrology.  4 hypertension-patient's blood pressure is controlled.  Continue present medications and follow.  5 hyperlipidemia-continue statin.  For questions or updates, please contact [place] Please consult [email or web address] for contact info under        Signed, Kirk Ruths, MD  06/16/2018, 7:31 AM

## 2018-06-16 NOTE — Consult Note (Signed)
Chief Complaint: Patient was seen in consultation today for right empyema chest tube placement at the request of Dr Remus Loffler  Supervising Physician: Markus Daft  Patient Status: [facility]  History of Present Illness: Vanessa Webb is a 73 y.o. female   ESRD; HTN; HLD; Afib- coumadin INR 2.1  Right thoracentesis 4/17 Bloody fluid collected + pseudomonas Weakness; deconditioning; wt loss  Request now per Dr Remus Loffler for right empyema chest tube drain Note 4/19:  I recommend stopping Coumadin and reversing anticoagulation with anticipation that the patient will need at least chest tube placement for drainage of the right empyema.  I recommend follow-up PA and lateral chest x-ray or noncontrast CT scan of the chest to be performed today.  Depending on findings on repeat imaging, I would favor CT-guided placement of large-bore chest tube as an attempt to adequately drain the patient's pleural effusion.  If pleural fluid cytology was not sent at the time of the patient's previous thoracentesis, pleural fluid cytology should be requested at the time of chest tube placement.  I would be very reluctant to consider this patient a candidate for surgery.  I discussed matters at length with patient at the bedside this afternoon.    Dr Anselm Pancoast has reviewed imaging and approves procedure Scheduled for same now    Past Medical History:  Diagnosis Date   Anemia    takes Folic Acid;pt states she gets an injection every 2wks    Chronic combined systolic and diastolic congestive heart failure ([place])    Closed right hip fracture, initial encounter ([place]) 10/19/2017   Depression    but doesn't take any meds for it   Diabetes mellitus    takes Lantus and Novolog 70/30;fasting blood sugar 200;Type 2   GERD (gastroesophageal reflux disease)    takes Omeprazole daily   GI bleeding    History of bladder infections    last one a month ago;saw Dr.Wrenn and cysto was done in office;was on antibiotics  and completed 2wks ago   History of blood transfusion    no blood transfusion   HLD (hyperlipidemia) 10/19/2017   HTN (hypertension) 10/19/2017   Hyperlipidemia    takes Zocor daily   Hypertension    takes Coreg daily   Mitral regurgitation    Myocardial infarction ([place]) 2010   PAF (paroxysmal atrial fibrillation) ([facility])    Pancreatitis    Peripheral neuropathy    Pleural effusion, bilateral    UTI (lower urinary tract infection)     Past Surgical History:  Procedure Laterality Date   A/V FISTULAGRAM Right 12/03/2016   Procedure: A/V Fistulagram - Right Arm;  Surgeon: Angelia Mould, MD;  Location: [place] [facility];  Service: Cardiovascular;  Laterality: Right;   ABDOMINAL HYSTERECTOMY  1976   APPENDECTOMY     [place] Right 09/11/2012   Procedure: BASCILIC VEIN TRANSPOSITION- RIGHT ARM;  Surgeon: Mal Misty, MD;  Location: [place];  Service: Vascular;  Laterality: Right;   bilateral laser surgery     CARDIAC CATHETERIZATION  2010   CATARACT EXTRACTION W/PHACO  03/01/2011   Procedure: CATARACT EXTRACTION PHACO AND INTRAOCULAR LENS PLACEMENT ([place]);  Surgeon: Tonny Branch;  Location: [facility];  Service: Ophthalmology;  Laterality: Right;  CDE: 15.83   CHOLECYSTECTOMY  2010   DILATION AND CURETTAGE OF UTERUS  1965   ESOPHAGOGASTRODUODENOSCOPY     hemorrhage to bilateral eye     INTRAMEDULLARY (IM) NAIL INTERTROCHANTERIC Right 10/20/2017   Procedure:  INTRAMEDULLARY (IM) NAIL INTERTROCHANTRIC FEMORAL NAIL;  Surgeon: Mcarthur Rossetti, MD;  Location: [place];  Service: Orthopedics;  Laterality: Right;   IR THORACENTESIS ASP PLEURAL SPACE W/IMG GUIDE  06/13/2018   PERIPHERAL VASCULAR BALLOON ANGIOPLASTY Right 12/03/2016   Procedure: PERIPHERAL VASCULAR BALLOON ANGIOPLASTY;  Surgeon: Angelia Mould, MD;  Location: [place] [facility];  Service: Cardiovascular;  Laterality: Right;  arm fistula   ruptured ulcer in stomach  2010    had a feeding tube over a month   SHUNTOGRAM N/A 12/02/2012   Procedure: Fistulagram;  Surgeon: Serafina Mitchell, MD;  Location: [facility];  Service: Cardiovascular;  Laterality: N/A;    Allergies: Sulfa antibiotics  Medications: Prior to Admission medications   Medication Sig Start Date End Date Taking? Authorizing Provider  acetaminophen (TYLENOL) 500 MG tablet Take 500 mg by mouth every 6 (six) hours as needed (for pain.).   Yes [provider]  amLODipine (NORVASC) 2.5 MG tablet TAKE ONE TABLET BY MOUTH ON NON DIALYSIS DAYS. (MONDAY, [place], [place]) Patient taking differently: Take 2.5 mg by mouth See admin instructions. TAKE ONE TABLET BY MOUTH ON NON DIALYSIS DAYS. (MONDAY, WEDNESDAY, [place]) 02/24/18  Yes Herminio Commons, MD  atorvastatin (LIPITOR) 20 MG tablet Take 20 mg by mouth at bedtime.   Yes [provider]  lidocaine-prilocaine (EMLA) cream Apply 1 application topically daily as needed (prior to port being accessed).  02/15/15  Yes [provider]  metoprolol tartrate (LOPRESSOR) 25 MG tablet TAKE 1 AND 1/2 TABLETS BY MOUTH TWICE DAILY. (MORNING,BEDTIME) Patient taking differently: Take 25 mg by mouth 2 (two) times daily.  08/21/17  Yes Branch, Alphonse Guild, MD  oxyCODONE (OXY IR/ROXICODONE) 5 MG immediate release tablet Take 1 tablet (5 mg total) by mouth every 4 (four) hours as needed for severe pain. 10/24/17  Yes Shelly Coss, MD  warfarin (COUMADIN) 2.5 MG tablet Take 1 tablet daily except 1/2 tablet on Saturdays as directed Patient taking differently: Take 1.25-2.5 mg by mouth See admin instructions. Take 2.5MG  by mouth daily except 1.25MG  on saturdays. 12/16/17  Yes Branch, Alphonse Guild, MD     Family History  Problem Relation Age of Onset   Diabetes Mother    Cancer Father    Diabetes Daughter    Heart attack Daughter    Pneumonia Daughter    Anesthesia problems Neg Hx    Hypotension Neg Hx    Malignant  hyperthermia Neg Hx    Pseudochol deficiency Neg Hx     Social History   Socioeconomic History   Marital status: Married    Spouse name: Not on file   Number of children: Not on file   Years of education: Not on file   Highest education level: Not on file  Occupational History   Not on file  Social Needs   Financial resource strain: Not on file   Food insecurity:    Worry: Not on file    Inability: Not on file   Transportation needs:    Medical: Not on file    Non-medical: Not on file  Tobacco Use   Smoking status: Never Smoker   Smokeless tobacco: Never Used  Substance and Sexual Activity   Alcohol use: No   Drug use: No   Sexual activity: Yes    Birth control/protection: Surgical  Lifestyle   Physical activity:    Days per week: Not on file    Minutes per session: Not on file  Stress: Not on file  Relationships   Social connections:    Talks on phone: Not on file    Gets together: Not on file    Attends religious service: Not on file    Active member of club or organization: Not on file    Attends meetings of clubs or organizations: Not on file    Relationship status: Not on file  Other Topics Concern   Not on file  Social History Narrative   Not on file    Review of Systems: A 12 point ROS discussed and pertinent positives are indicated in the HPI above.  All other systems are negative.  Review of Systems  Constitutional: Positive for activity change, appetite change, fatigue and unexpected weight change.  Respiratory: Positive for cough and shortness of breath.   Neurological: Positive for weakness.  Psychiatric/Behavioral: Negative for behavioral problems and confusion.    Vital Signs: BP 125/67 (BP Location: Left Arm)    Pulse (!) 111    Temp 98.1 F (36.7 C) (Oral)    Resp (!) 21    Ht 5\' 4"  (1.626 m)    Wt 115 lb 4.8 oz (52.3 kg)    SpO2 96%    BMI 19.79 kg/m   Physical Exam Vitals signs reviewed.  Cardiovascular:     Rate  and Rhythm: Normal rate. Rhythm irregular.  Pulmonary:     Breath sounds: Wheezing present.  Abdominal:     General: Bowel sounds are normal.  Musculoskeletal: Normal range of motion.  Skin:    General: Skin is warm and dry.  Neurological:     Mental Status: She is alert and oriented to person, place, and time.  Psychiatric:        Mood and Affect: Mood normal.        Behavior: Behavior normal.        Thought Content: Thought content normal.        Judgment: Judgment normal.     Imaging: Dg Chest 1 View  Result Date: 06/13/2018 CLINICAL DATA:  Post thoracentesis right EXAM: CHEST  1 VIEW COMPARISON:  CT 06/13/2018 FINDINGS: Moderately large right pleural effusion. No pneumothorax post thoracentesis. Diffuse airspace disease on the right unchanged Small left effusion and mild left lower lobe airspace disease. Fracture proximal left humerus appears subacute with some callus formation. IMPRESSION: No pneumothorax post right thoracentesis. Electronically Signed   By: Franchot Gallo M.D.   On: 06/13/2018 17:43   Ct Chest Wo Contrast  Result Date: 06/15/2018 CLINICAL DATA:  Bilateral pleural effusions. EXAM: CT CHEST WITHOUT CONTRAST TECHNIQUE: Multidetector CT imaging of the chest was performed following the standard protocol without IV contrast. COMPARISON:  06/13/2018 FINDINGS: Cardiovascular: The heart size is normal. No substantial pericardial effusion. Coronary artery calcification is evident. Atherosclerotic calcification is noted in the wall of the thoracic aorta. Prominence of the main pulmonary arteries suggests pulmonary arterial hypertension. Mediastinum/Nodes: Upper normal mediastinal lymph nodes are again noted. Assessment right hilum limited by lack of intravenous contrast and adjacent collapse/consolidation. No gross bulky left hilar lymphadenopathy. There is no axillary lymphadenopathy. Gas and residual contrast material in the esophagus may be related to reflux or dysmotility.  Lungs/Pleura: The central tracheobronchial airways are patent. Collapse/consolidation of the right middle and lower lobes is similar to prior. 9 mm left upper lobe pulmonary nodule is similar to prior. Dependent left lower lobe atelectasis is stable. Large right pleural effusion is stable to potentially minimally decreased in the interval. The diffuse apparent pleural  thickening in the right hemithorax is similar to prior. Small left pleural effusion is stable to minimally progressed. Upper Abdomen: Nodular contour of the liver suggest cirrhosis. Musculoskeletal: Heterogeneous mineralization of bony anatomy is unchanged. Comminuted proximal left humerus fracture again noted. IMPRESSION: 1. No substantial interval change in the large apparently loculated right pleural effusion with associated pleural thickening in the right hemithorax. 2. Similar appearance of the small dependent left lower lobe effusion. 3. Marked collapse/consolidation of the right middle and lower lobes with some dependent atelectasis in the left base. 4. Similar appearance 8-9 mm left upper lobe pulmonary nodule. 5. Prominence of the main pulmonary arteries suggests pulmonary arterial hypertension. 6. Nodular hepatic contour suggests cirrhosis. 7.  Aortic Atherosclerois (ICD10-170.0) Electronically Signed   By: Misty Stanley M.D.   On: 06/15/2018 15:20   Ct Chest Wo Contrast  Result Date: 06/13/2018 CLINICAL DATA:  Invasion. End-stage renal disease on hemodialysis. Evaluate pleural effusion. EXAM: CT CHEST WITHOUT CONTRAST TECHNIQUE: Multidetector CT imaging of the chest was performed following the standard protocol without IV contrast. COMPARISON:  06/11/2018 chest radiograph. FINDINGS: Cardiovascular: Top-normal heart size. No significant pericardial effusion/thickening. Left main and 3 vessel coronary atherosclerosis. Atherosclerotic nonaneurysmal thoracic aorta. Dilated main pulmonary artery (3.8 cm diameter). Mediastinum/Nodes: No  discrete thyroid nodules. Unremarkable esophagus. No axillary adenopathy. Right paratracheal adenopathy up to 1.5 cm (series 3/image 57). No discrete hilar adenopathy on this noncontrast scan. Lungs/Pleura: No pneumothorax. Large loculated right pleural effusion with diffuse irregular right pleural thickening. Small dependent left pleural effusion. No appreciable calcified pleural plaques. Near complete right lung atelectasis with mild residual aeration predominantly in the right upper lobe. Mild compressive atelectasis in the dependent left lower lobe. No discrete lung masses. Solid left upper lobe 8 mm pulmonary nodule (series 5/image 53). No additional significant pulmonary nodules. No central airway stenoses. Upper abdomen: Liver surface appears slightly irregular, cannot exclude hepatic cirrhosis. Small volume perihepatic ascites. Musculoskeletal: No aggressive appearing focal osseous lesions. Mild thoracic spondylosis. Extensively comminuted left proximal humerus fracture is partially visualized. IMPRESSION: 1. Large loculated right pleural effusion with diffuse irregular right pleural thickening. Right pleural empyema or neoplasm cannot be excluded. Suggest diagnostic right thoracentesis. 2. Near complete right lung atelectasis. 3. Small dependent left pleural effusion with mild dependent left lung base atelectasis. No left pleural thickening. 4. Solid 8 mm left upper lobe pulmonary nodule. If evaluation of the right pleural effusion reveals neoplastic etiology, suggested attention to this nodule on follow-up chest CT in 3 months. Otherwise, follow-up chest CT in 6 months recommended. 5. Suggestion of slight liver surface irregularity, cannot exclude appendix cirrhosis. Small volume perihepatic ascites. 6. Dilated main pulmonary artery, suggesting pulmonary arterial hypertension. 7. Nonspecific mild right paratracheal lymphadenopathy. If evaluation of the right pleural effusion reveals neoplastic etiology,  recommend attention on follow-up chest CT with IV contrast in 3 months. 8. Left main and 3 vessel coronary atherosclerosis. Aortic Atherosclerosis (ICD10-I70.0). Electronically Signed   By: Ilona Sorrel M.D.   On: 06/13/2018 09:38   Dg Chest Port 1 View  Result Date: 06/11/2018 CLINICAL DATA:  Pleural effusion. Hypertension. Diabetes. Prior myocardial infarction. EXAM: PORTABLE CHEST 1 VIEW COMPARISON:  06/10/2018 and 03/27/2018. FINDINGS: Patient rotated to the right. Midline trachea. Mild cardiomegaly. A moderate right pleural effusion with suggestion of loculation superiorly, similar. Tiny left pleural effusion. No pneumothorax. Similar lower lung predominant right-sided airspace disease. The left lung remains clear. Atherosclerosis in the transverse aorta. Proximal left humerus fracture. IMPRESSION: No significant change since one day  prior. Moderate right pleural effusion with suggestion of loculation superiorly. Underlying right-sided airspace disease could represent atelectasis and/or infection. Aortic Atherosclerosis (ICD10-I70.0). Tiny left pleural effusion. Electronically Signed   By: Abigail Miyamoto M.D.   On: 06/11/2018 08:22   Ir Thoracentesis Asp Pleural Space W/img Guide  Result Date: 06/13/2018 INDICATION: Patient with right pleural effusion. Request is made for diagnostic and therapeutic thoracentesis. EXAM: ULTRASOUND GUIDED DIAGNOSTIC AND THERAPEUTIC RIGHT THORACENTESIS MEDICATIONS: 10 mL 1% lidocaine COMPLICATIONS: None immediate. PROCEDURE: An ultrasound guided thoracentesis was thoroughly discussed with the patient and questions answered. The benefits, risks, alternatives and complications were also discussed. The patient understands and wishes to proceed with the procedure. Written consent was obtained. Ultrasound was performed to localize and mark an adequate pocket of fluid in the right chest. The area was then prepped and draped in the normal sterile fashion. 1% Lidocaine was used for  local anesthesia. Under ultrasound guidance a 6 Fr Safe-T-Centesis catheter was introduced. Thoracentesis was performed. The catheter was removed and a dressing applied. FINDINGS: A total of approximately 300 mL of bloody fluid was removed. Samples were sent to the laboratory as requested by the clinical team. IMPRESSION: Successful ultrasound guided diagnostic and therapeutic right thoracentesis yielding 300 mL of pleural fluid. Read by: Brynda Greathouse PA-C Electronically Signed   By: Markus Daft M.D.   On: 06/13/2018 17:12    Labs:  CBC: Recent Labs    06/13/18 1809 06/14/18 0736 06/15/18 0655 06/16/18 0537  WBC 15.8* 17.2* 19.6* 15.7*  HGB 11.5* 9.6* 10.7* 10.0*  HCT 36.0 30.6* 33.1* 32.8*  PLT 173 163 134* 139*    COAGS: Recent Labs    10/19/17 1704  06/13/18 0401 06/14/18 0921 06/15/18 0406 06/16/18 0537  INR 1.39   < > 3.7* 4.0* 3.7* 2.1*  APTT 33  --   --   --   --   --    < > = values in this interval not displayed.    BMP: Recent Labs    06/13/18 0401 06/14/18 0736 06/15/18 0406 06/16/18 0537  NA 136 136 138 138  K 4.5 4.2 3.6 4.4  CL 98 95* 96* 96*  CO2 25 24 27 23   GLUCOSE 109* 105* 95 120*  BUN 28* 36* 19 26*  CALCIUM 8.1* 8.1* 7.9* 8.0*  CREATININE 3.97* 4.81* 3.00* 3.91*  GFRNONAA 10* 8* 15* 11*  GFRAA 12* 10* 17* 12*    LIVER FUNCTION TESTS: Recent Labs    10/19/17 1704  06/12/18 0431 06/13/18 2041 06/14/18 0736 06/16/18 0537  BILITOT 1.2  --  0.9 0.7  --   --   AST 127*  --  42* 64*  --   --   ALT 75*  --  23 31  --   --   ALKPHOS 91  --  74 73  --   --   PROT 7.1  --  5.8* 6.2*  --   --   ALBUMIN 3.2*   < > 1.8* 1.7* 1.5* 1.6*   < > = values in this interval not displayed.    TUMOR MARKERS: No results for input(s): AFPTM, CEA, CA199, CHROMGRNA in the last 8760 hours.  Assessment and Plan:  Right empyema For chest tube drain in IR Risks and benefits discussed with the patient including bleeding, infection, damage to adjacent  structures, and sepsis.  All of the patient's questions were answered, patient is agreeable to proceed. Consent signed and in chart.  Thank you for this  interesting consult.  I greatly enjoyed meeting MARTIN SMEAL and look forward to participating in their care.  A copy of this report was sent to the requesting provider on this date.  Electronically Signed: Lavonia Drafts, PA-C 06/16/2018, 10:31 AM   I spent a total of 40 Minutes    in face to face in clinical consultation, greater than 50% of which was counseling/coordinating care for right chest tube placement

## 2018-06-17 ENCOUNTER — Encounter (HOSPITAL_COMMUNITY): Payer: Self-pay | Admitting: Registered Nurse

## 2018-06-17 ENCOUNTER — Inpatient Hospital Stay (HOSPITAL_COMMUNITY): Payer: Medicare Other

## 2018-06-17 DIAGNOSIS — K591 Functional diarrhea: Secondary | ICD-10-CM

## 2018-06-17 DIAGNOSIS — J869 Pyothorax without fistula: Secondary | ICD-10-CM

## 2018-06-17 DIAGNOSIS — R197 Diarrhea, unspecified: Secondary | ICD-10-CM

## 2018-06-17 LAB — TYPE AND SCREEN
ABO/RH(D): O POS
Antibody Screen: NEGATIVE

## 2018-06-17 LAB — CBC
HCT: 35.4 % — ABNORMAL LOW (ref 36.0–46.0)
Hemoglobin: 10.7 g/dL — ABNORMAL LOW (ref 12.0–15.0)
MCH: 25.5 pg — ABNORMAL LOW (ref 26.0–34.0)
MCHC: 30.2 g/dL (ref 30.0–36.0)
MCV: 84.5 fL (ref 80.0–100.0)
Platelets: 130 10*3/uL — ABNORMAL LOW (ref 150–400)
RBC: 4.19 MIL/uL (ref 3.87–5.11)
RDW: 17.5 % — ABNORMAL HIGH (ref 11.5–15.5)
WBC: 11.4 10*3/uL — ABNORMAL HIGH (ref 4.0–10.5)
nRBC: 0 % (ref 0.0–0.2)

## 2018-06-17 LAB — BLOOD GAS, ARTERIAL
Acid-Base Excess: 4.5 mmol/L — ABNORMAL HIGH (ref 0.0–2.0)
Bicarbonate: 27.5 mmol/L (ref 20.0–28.0)
Drawn by: 441661
FIO2: 0.21
O2 Saturation: 94.6 %
Patient temperature: 98.6
pCO2 arterial: 34.2 mmHg (ref 32.0–48.0)
pH, Arterial: 7.516 — ABNORMAL HIGH (ref 7.350–7.450)
pO2, Arterial: 73 mmHg — ABNORMAL LOW (ref 83.0–108.0)

## 2018-06-17 LAB — CULTURE, BODY FLUID W GRAM STAIN -BOTTLE

## 2018-06-17 LAB — RENAL FUNCTION PANEL
Albumin: 1.5 g/dL — ABNORMAL LOW (ref 3.5–5.0)
Anion gap: 14 (ref 5–15)
BUN: 14 mg/dL (ref 8–23)
CO2: 25 mmol/L (ref 22–32)
Calcium: 7.9 mg/dL — ABNORMAL LOW (ref 8.9–10.3)
Chloride: 98 mmol/L (ref 98–111)
Creatinine, Ser: 2.53 mg/dL — ABNORMAL HIGH (ref 0.44–1.00)
GFR calc Af Amer: 21 mL/min — ABNORMAL LOW (ref >60)
GFR calc non Af Amer: 18 mL/min — ABNORMAL LOW (ref >60)
Glucose, Bld: 118 mg/dL — ABNORMAL HIGH (ref 70–99)
Phosphorus: 2.5 mg/dL (ref 2.5–4.6)
Potassium: 4.6 mmol/L (ref 3.5–5.1)
Sodium: 137 mmol/L (ref 135–145)

## 2018-06-17 LAB — CULTURE, BODY FLUID-BOTTLE

## 2018-06-17 LAB — APTT: aPTT: 33 seconds (ref 24–36)

## 2018-06-17 LAB — GLUCOSE, CAPILLARY
Glucose-Capillary: 86 mg/dL (ref 70–99)
Glucose-Capillary: 78 mg/dL (ref 70–99)
Glucose-Capillary: 106 mg/dL — ABNORMAL HIGH (ref 70–99)

## 2018-06-17 LAB — MAGNESIUM: Magnesium: 2.2 mg/dL (ref 1.7–2.4)

## 2018-06-17 LAB — SURGICAL PCR SCREEN
MRSA, PCR: NEGATIVE
Staphylococcus aureus: NEGATIVE

## 2018-06-17 LAB — PROTIME-INR
INR: 1.8 — ABNORMAL HIGH (ref 0.8–1.2)
Prothrombin Time: 20.4 seconds — ABNORMAL HIGH (ref 11.4–15.2)

## 2018-06-17 LAB — ABO/RH: ABO/RH(D): O POS

## 2018-06-17 MED ORDER — LOPERAMIDE HCL 2 MG PO CAPS
2.0000 mg | ORAL_CAPSULE | Freq: Two times a day (BID) | ORAL | Status: DC | PRN
  Administered 2018-06-17 – 2018-06-25 (×3): 2 mg via ORAL
  Filled 2018-06-17 (×3): qty 1

## 2018-06-17 MED ORDER — VANCOMYCIN HCL IN DEXTROSE 1-5 GM/200ML-% IV SOLN
1000.0000 mg | INTRAVENOUS | Status: AC
  Administered 2018-06-18: 1000 mg via INTRAVENOUS
  Filled 2018-06-17 (×3): qty 200

## 2018-06-17 NOTE — Progress Notes (Signed)
Patient ID: Vanessa Webb, female   DOB: June 01, 1945, 73 y.o.   MRN: [id number]    IR Round note via phone secondary new regulations  IR procedure 4/20: Indications: Infected right pleural effusion Procedure: CT guided right chest tube placement Findings: Complex right pleural effusion, placement of 14 Fr tube Complications: None EBL: Minimal, less than 10 ml Plan: Chest tube to PleurEvac and wall suction.    OP is 650 cc so far No air leak per RN Site is C/D/I  Pt feeling some better CXR today: IMPRESSION: Right chest tube placed with improvement in the right pleural effusion.   Will follow

## 2018-06-17 NOTE — Progress Notes (Signed)
Patient taken for HD in bed.

## 2018-06-17 NOTE — Progress Notes (Signed)
PROGRESS NOTE  Vanessa Webb BWL:[id number] DOB: 02-15-1946 DOA: 06/11/2018 PCP: Monico Blitz, MD   LOS: 5 days   Brief Narrative / Interim history: 73 year old female with history of ESRD on HD TTS, hypertension, hyperlipidemia, A. fib on Coumadin for anticoagulation and chronic right leg ulcer transferred from [facility] for hemodialysis.  Patient presented to [facility] with 2 weeks of generalized weakness and found to be in A. fib with RVR.  She was placed on diltiazem drip transition to amiodarone prior to transfer. Patient is dialysis on 4/14 due to weakness.  Reports good compliance with her metoprolol.  Off note, patient has had poor appetite, generalized weakness and about 40 pound weight loss over the last 1 year.  She fell and broke her right hip 6 months ago and underwent IM nailing.  She also fell and broke her left shoulder 3 months ago on the way to dialysis that has been treated conservatively.  Patient remained in A. fib with RVR on amiodarone drip. Initially, plan for DCCV if heart rate is not under good control.  This has changed after talking to CTS about the pleural effusion we took the precedence, and her heart also improved.  Large right-sided pleural effusion noted on chest x-ray and CT chest.  These appear to be chronic. She had US-guided diagnostic and therapeutic right thoracocentesis on 4/17 that yielded 300 cc exudative fluid by LDH.  Pleural fluid Gram stain and culture with GNR.  AFB smear negative.  AFB culture and cytology pending.  Started on Unasyn and Flagyl.  Cardiothoracic surgery consulted on 4/19 recommended stopping warfarin, INR reversal and chest tube placement by IR.  Pleural fluid culture speciated to Pseudomonas aeruginosa.  Transitioned to cefepime on 06/16/2018.  Patient had chest tube placement by IR on 06/16/2018.   Subjective: No major events overnight of this morning.  She states difficulty sleeping due to unfamiliar environment.  Denies pain, dyspnea or  palpitation.  She reports watery bowel movements over the last 2 days.  She denies abdominal pain.  She has no leukocytosis.  WBC downtrending on antibiotic for the pleural effusion.  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  Gram stain and cultures grew Pseudomonas. AFB smears Gram stain negative.  Fluid cytology consistent with acute inflammation but negative for malignancy.  AFB culture pending.  Patient without fever but leukocytosis.  CTS consulted on 4/19 and recommend repeat CT chest that showed persistent reportedly loculated large pleural effusion on the right side.  CTS reviewed the CT chest and does not think the effusion is loculated, and recommended chest tube placement by IR after INR reversal.  Per CTS, patient is poor candidate for VATS or open thoracotomy.  INR reversed by vitamin K.  Patient had chest tube placement on 06/16/2018. -Chest tube drained about 650 cc so far. -Ceftriaxone 4/18-4/19 -Unasyn and Flagyl 4/19--4/20 -Cefepime 4/20--for pseudomonal coverage -CTS following-continue chest tube, fibrinolytics and VATS -Follow pleural fluid AFB culture -Daily  chest x-ray  Paroxysmal atrial fibrillation with RVR: RVR improved.  Heart rate in 90s to 100s.  Chads vascular score 5.  Has bled score 3.  Echo as below.  Thyroid panel within normal range. -Amiodarone and metoprolol per cardiology -Warfarin on hold.  -Closely monitor electrolytes and replenish -Plan for possible DCCV in 3 weeks after chest tube  Acute on chronic combined CHF: BNP elevated but difficult to interpret in the setting of ESRD.  Likely due to missed dialysis A. Fib. Echo this admission showed LVEF of 40-45% (down from 55-60% in 02/2016) with diffuse hypokinesis. Reduced EF likely due to RVR.  -Volume managed by HD  Leukocytosis: Multiple potential sources including possible parapneumonic pleural effusion, pressure injury of the skin, RLE ulcer and stress.  Pressure skin injury and RLE ulcer appears to be clean.  Improving. -Antibiotic as above -Manage pleural effusion as above -Continue trending.  Pulmonary nodule/mild right paratracheal lymphadenopathy: 8 mm LUL pulmonary nodule noted on CT chest. -Outpatient CT follow-up in 3 to 6 months for the nodule -Pleural fluid cytology negative for malignancy.   Diarrhea: Apparently chronic issue.  She says she takes Imodium at home.  Abdominal exam benign.  Leukocytosis likely due to parapneumonic infection and is improving on antibiotic. -Resume Imodium  Recurrent fall/right hip fracture/comminuted left proximal humerus fracture -Status post IM nailing of right hip 6 months ago -Left shoulder being managed conservatively  Hypertension: Normotensive. -Manage as above.  ESRD on HD TTS/anemia of chronic disease/mineral bone disease -Nephrology managing.  Right lower extremity wound: followed by wound care outpatient.  Reportedly improving per patient. -Appreciate wound care input -Outpatient follow-up.  Well-controlled DM-2: A1c 5.9%.  CBG within normal range. -Discontinued CBG monitoring  RLE wound: POA-no signs of infection.  -lateral 12cm x 1.5cm x 0.2cm; 90% black soft eschar -Medial 4cm x 2.0cm x 0.2cm; 50% black soft eschar -Appreciate wound care input-cnzymatic debridement ointment to the LE wounds, wet-to-dry dressing            Stage III sacral wound: POA.   0.4cm x 0.2cm x 0.1cm; scabbed.  No drainage -Enzymatic debridement ointment to the sacrum  Scheduled Meds: . amiodarone  200 mg Oral BID  . atorvastatin  20 mg Oral QHS  . Chlorhexidine Gluconate Cloth  6 each Topical Q0600  . Chlorhexidine Gluconate Cloth  6 each Topical Q0600  . collagenase   Topical Daily  . feeding supplement (NEPRO CARB STEADY)  237 mL Oral BID BM  . metoprolol tartrate  25 mg Oral BID  . multivitamin  1 tablet Oral QHS  . potassium chloride  20 mEq Oral BID   Continuous Infusions: . ceFEPime (MAXIPIME) IV     PRN Meds:.acetaminophen **OR** acetaminophen, acetaminophen, antiseptic oral rinse, loperamide, oxyCODONE  DVT prophylaxis: Off Coumadin for procedure. Code Status: DNR Family Communication: None at bedside.  Updated patient's husband over the phone.   Disposition Plan: Remains inpatient  Consultants:   Nephrology  Cardiology  Cardiothoracic surgery  IR  Procedures:   HD on 4/16  IR thoracocentesis on 4/17  Chest tube placement on 4/20  Antimicrobials:  As above  Objective: Vitals:   06/17/18 0536 06/17/18 0606 06/17/18 0931 06/17/18 0932  BP: (!) 111/92  113/68 113/68  Pulse: (!) 115  99 (!) 102  Resp: (!) 25   18  Temp:  (!) 97.2 F (36.2 C)    TempSrc:  Axillary    SpO2: 100%     Weight: 51.8 kg  Height:        Intake/Output Summary (Last 24 hours) at 06/17/2018 1139 Last data filed at 06/17/2018 0847 Gross per 24 hour  Intake 620 ml  Output 650 ml  Net -30 ml   Filed Weights   06/15/18 0619 06/16/18 0654 06/17/18 0536  Weight: 52 kg 52.3 kg 51.8 kg    Examination:  GENERAL: Chronically ill-appearing elderly female.  No acute distress. HEENT: MMM.  Vision and Hearing grossly intact.  NECK: Supple.  No JVD.  LUNGS:  No IWOB.  On RA.  Diminished aeration and fine crackles over right lung. HEART: Heart rate in 90s to 100s.  Irregular.  Heart sounds normal. ABD: Bowel sounds present. Soft. Non tender.  No  organomegaly. EXT: No edema bilaterally.  Moves all extremities. SKIN: Dry and clean dressing over RLE.  No surrounding skin erythema or drainage. NEURO: Awake, alert and oriented appropriately.  No gross deficit.  PSYCH: Calm. Normal affect.  Data Reviewed: I have independently reviewed following labs and imaging studies  CBC: Recent Labs  Lab 06/13/18 1809 06/14/18 0736 06/15/18 0655 06/16/18 0537 06/17/18 0452  WBC 15.8* 17.2* 19.6* 15.7* 11.4*  HGB 11.5* 9.6* 10.7* 10.0* 10.7*  HCT 36.0 30.6* 33.1* 32.8* 35.4*  MCV 85.5 85.2 83.8 87.7 84.5  PLT 173 163 134* 139* 086*   Basic Metabolic Panel: Recent Labs  Lab 06/12/18 0431 06/13/18 0401 06/14/18 0736 06/15/18 0406 06/16/18 0537 06/17/18 0452  NA 132* 136 136 138 138  --   K 3.9 4.5 4.2 3.6 4.4  --   CL 92* 98 95* 96* 96*  --   CO2 23 25 24 27 23   --   GLUCOSE 102* 109* 105* 95 120*  --   BUN 63* 28* 36* 19 26*  --   CREATININE 6.73* 3.97* 4.81* 3.00* 3.91*  --   CALCIUM 8.2* 8.1* 8.1* 7.9* 8.0*  --   MG 2.0 1.9 2.0 1.8 2.0 2.2  PHOS  --   --  4.1  --  4.1  --    GFR: Estimated Creatinine Clearance: 10.3 mL/min (A) (by C-G formula based on SCr of 3.91 mg/dL (H)). Liver Function Tests: Recent Labs  Lab 06/12/18 0431 06/13/18 2041 06/14/18 0736 06/16/18 0537  AST 42* 64*  --   --   ALT 23 31  --   --   ALKPHOS 74 73  --   --   BILITOT 0.9 0.7  --   --   PROT 5.8* 6.2*  --   --   ALBUMIN 1.8* 1.7* 1.5* 1.6*   No results for input(s): LIPASE, AMYLASE in the last 168 hours. No results for input(s): AMMONIA in the last 168 hours. Coagulation Profile: Recent Labs  Lab 06/14/18 0921 06/15/18 0406 06/16/18 0537 06/16/18 1229 06/17/18 0452  INR 4.0* 3.7* 2.1* 1.9* 1.8*   Cardiac Enzymes: No results for input(s): CKTOTAL, CKMB, CKMBINDEX, TROPONINI in the last 168 hours. BNP (last 3 results) No results for input(s): PROBNP in the last 8760 hours. HbA1C: No results for input(s): HGBA1C in the last 72  hours. CBG: Recent Labs  Lab 06/15/18 2147 06/16/18 0753 06/16/18 1150 06/16/18 2104 06/17/18 0742  GLUCAP 119* 109* 122* 112* 86   Lipid Profile: No results for input(s): CHOL, HDL, LDLCALC, TRIG, CHOLHDL, LDLDIRECT in the last 72 hours. Thyroid Function Tests: No results for input(s): TSH, T4TOTAL, FREET4, T3FREE, THYROIDAB in the last 72 hours. Anemia Panel: No results for input(s): VITAMINB12, FOLATE, FERRITIN, TIBC, IRON,  RETICCTPCT in the last 72 hours. Urine analysis:    Component Value Date/Time   COLORURINE YELLOW 01/21/2009 1719   APPEARANCEUR CLOUDY (A) 01/21/2009 1719   LABSPEC 1.021 01/21/2009 1719   PHURINE 5.0 01/21/2009 1719   GLUCOSEU NEGATIVE 01/21/2009 1719   HGBUR TRACE (A) 01/21/2009 1719   BILIRUBINUR MODERATE (A) 01/21/2009 1719   KETONESUR 15 (A) 01/21/2009 1719   PROTEINUR 30 (A) 01/21/2009 1719   UROBILINOGEN 1.0 01/21/2009 1719   NITRITE POSITIVE (A) 01/21/2009 1719   LEUKOCYTESUR MODERATE (A) 01/21/2009 1719   Sepsis Labs: Invalid input(s): PROCALCITONIN, LACTICIDVEN  Recent Results (from the past 240 hour(s))  MRSA PCR Screening     Status: None   Collection Time: 06/11/18  6:23 AM  Result Value Ref Range Status   MRSA by PCR NEGATIVE NEGATIVE Final    Comment:        The GeneXpert MRSA Assay (FDA approved for NASAL specimens only), is one component of a comprehensive MRSA colonization surveillance program. It is not intended to diagnose MRSA infection nor to guide or monitor treatment for MRSA infections. Performed at [facility], [place] [address] [place], [place] [address]   Gram stain     Status: None   Collection Time: 06/13/18  5:04 PM  Result Value Ref Range Status   Specimen Description FLUID RIGHT PLEURAL  Final   Special Requests NONE  Final   Gram Stain   Final    ABUNDANT WBC PRESENT,BOTH PMN AND MONONUCLEAR NO ORGANISMS SEEN Performed at [facility], [address] [place], [place] [address]     Report Status 06/13/2018 FINAL  Final  Acid Fast Smear (AFB)     Status: None   Collection Time: 06/13/18  5:04 PM  Result Value Ref Range Status   AFB Specimen Processing Concentration  Final   Acid Fast Smear Negative  Final    Comment: (NOTE) Performed At: [facility] [place], [place] [id number] Rush Farmer MD DH:[id number]    Source (AFB) FLUID  Final    Comment: PLEURAL RIGHT Performed at [facility], [place] [address], [place], [place] [address]   Culture, body fluid-bottle     Status: Abnormal (Preliminary result)   Collection Time: 06/13/18  5:04 PM  Result Value Ref Range Status   Specimen Description FLUID RIGHT PLEURAL  Final   Special Requests BOTTLES DRAWN AEROBIC AND ANAEROBIC  Final   Gram Stain   Final    GRAM NEGATIVE RODS AEROBIC BOTTLE ONLY CRITICAL RESULT CALLED TO, READ BACK BY AND VERIFIED WITH: E. MONGE, RN CONCERNING GRAM STAIN AT 2122 ON 06/14/18 BY C. JESSUP, MLT    Culture PSEUDOMONAS AERUGINOSA (A)  Final   Report Status PENDING  Incomplete   Organism ID, Bacteria PSEUDOMONAS AERUGINOSA  Final      Susceptibility   Pseudomonas aeruginosa - MIC*    CEFTAZIDIME 4 SENSITIVE Sensitive     CIPROFLOXACIN <=0.25 SENSITIVE Sensitive     GENTAMICIN <=1 SENSITIVE Sensitive     IMIPENEM 2 SENSITIVE Sensitive     PIP/TAZO 16 SENSITIVE Sensitive     CEFEPIME Value in next row Sensitive      <=1 SENSITIVEPerformed at [place] [address], [place], [place] [address]    * PSEUDOMONAS AERUGINOSA      Radiology Studies: Dg Chest Port 1 View  Result Date: 06/17/2018 CLINICAL DATA:  Chest tube in place EXAM: PORTABLE CHEST 1 VIEW COMPARISON:  06/13/2018 FINDINGS: A chest tube now projects over the right lower hemithorax. The right effusion has improved. Overall volume has diminished. There is now some air within the pleural fluid consistent with hydropneumothorax. There is no apical right pneumothorax. Small left  effusion and basilar atelectasis. Normal heart size. IMPRESSION: Right chest tube placed with improvement in the right pleural effusion. Electronically Signed   By: Marybelle Killings M.D.   On: 06/17/2018 08:11    Jonay Hitchcock T. [facility] Pager [phone]  If 7PM-7AM, please contact night-coverage [email or web address] Password [facility] 06/17/2018, 11:39 AM

## 2018-06-17 NOTE — Progress Notes (Signed)
[facility] UniversitySuite [address]       [place] [address]             [phone]     CARDIOTHORACIC SURGERY PROGRESS NOTE  Subjective: No complaints.  Denies chest pain, fever, SOB  Objective: Vital signs in last 24 hours: Temp:  [97.2 F (36.2 C)-98.6 F (37 C)] 97.2 F (36.2 C) (04/21 0606) Pulse Rate:  [89-115] 102 (04/21 0932) Cardiac Rhythm: Atrial fibrillation (04/21 0700) Resp:  [17-25] 18 (04/21 0932) BP: (98-115)/(55-92) 113/68 (04/21 0932) SpO2:  [96 %-100 %] 100 % (04/21 0536) Weight:  [51.8 kg] 51.8 kg (04/21 0536)  Physical Exam:  Rhythm:   Afib  Breath sounds: Diminished on right  Heart sounds:  irregular  Incisions:  n/a  Abdomen:  Soft, non-distended, non-tender  Extremities:  Warm, adequately-perfused   Intake/Output from previous day: 04/20 0701 - 04/21 0700 In: 500 [P.O.:500] Out: 650 [Chest Tube:650] Intake/Output this shift: Total I/O In: 120 [P.O.:120] Out: -   Lab Results: Recent Labs    06/16/18 0537 06/17/18 0452  WBC 15.7* 11.4*  HGB 10.0* 10.7*  HCT 32.8* 35.4*  PLT 139* 130*   BMET:  Recent Labs    06/15/18 0406 06/16/18 0537  NA 138 138  K 3.6 4.4  CL 96* 96*  CO2 27 23  GLUCOSE 95 120*  BUN 19 26*  CREATININE 3.00* 3.91*  CALCIUM 7.9* 8.0*    CBG (last 3)  Recent Labs    06/16/18 1150 06/16/18 2104 06/17/18 0742  GLUCAP 122* 112* 86   PT/INR:   Recent Labs    06/17/18 0452  LABPROT 20.4*  INR 1.8*    CXR:  PORTABLE CHEST 1 VIEW  COMPARISON:  06/13/2018  FINDINGS: A chest tube now projects over the right lower hemithorax. The right effusion has improved. Overall volume has diminished. There is now some air within the pleural fluid consistent with hydropneumothorax. There is no apical right pneumothorax. Small left effusion and basilar atelectasis. Normal heart size.  IMPRESSION: Right chest tube placed with improvement in the right pleural effusion.   Electronically Signed   By:  Marybelle Killings M.D.   On: 06/17/2018 08:11  Assessment/Plan:  It appears that a majority of the pleural fluid has been evacuated following successful placement of a small chest tube into the right pleural space by interventional radiology.  However, the right lung has not reexpanded.  This implies the presence of significant loculations versus trapped lung related to the presence of a chronic pleural effusion and secondary fibrosis.  Options for management include:  1.  No further intervention other than leaving the current chest tube in place until drainage diminishes and then removing it.  In the setting of ongoing infection long-term prognosis would be poor.  2.  Attempt insufflation of fibrinolytic medications to break up loculations with hopes that the patient might ultimately have improved reinflation of the right lung.  Based upon radiographic appearance I am not optimistic that this would work well.  3.  Proceed with video-assisted thoracoscopic surgery for mechanical cleansing of the pleural space and removal of loculations.  This would require general anesthesia but probably could be performed with relatively low risk, although turning the patient to her left side might cause increased pain related to her left shoulder fracture.  This may be the best approach, although it may not result in complete reexpansion of the lung if there is extensive chronic fibrosis and trapped lung.  4.  Open thoracotomy for decortication.  This would have the best chance to result in complete reexpansion of the lung, but it would also be a large operation that would carry considerable risk.  I would not consider this patient a candidate for extensive decortication because of her advanced age, numerous severe comorbid medical problems, and degree of baseline debilitation.  I discussed matters at length with the patient at the bedside.  I telephoned her husband and spoke with him as well.  We discussed all 4  options.  At this point I suspect that the best choice is limited video-assisted thoracoscopy.  Is likely that it can be done with reasonably low risk and give her a reasonable chance that significant reexpansion of the right lung.  The patient understands and accepts all potential associated risks of surgery including but not limited to risk of death, stroke, myocardial infarction, congestive heart failure, respiratory failure, pneumonia, bleeding requiring blood transfusion and/or reexploration, incomplete reexpansion of the lung with possibility for recurrent pneumonia and/or empyema, recurrent pleural effusion, prolonged air leak, wound infection, pulmonary embolus or other thromboembolic complication, chronic pain or other delayed complications.  All questions answered.  We tentatively plan for surgery tomorrow.   I spent in excess of 30 minutes during the conduct of this hospital encounter and >50% of this time involved direct face-to-face encounter with the patient for counseling and/or coordination of their care.    Rexene Alberts, MD 06/17/2018 10:02 AM

## 2018-06-17 NOTE — Progress Notes (Signed)
Patient ID: Vanessa Webb, female   DOB: [birth year], 73 y.o.   MRN: [id number] [facility] Progress Note   Assessment/ Plan:   1.  Atrial fibrillation with rapid ventricular response:  s/p cardioversion    2.  End-stage renal disease: HD today per TTS schedule.  Normally at [place].  Note dry weight appears to be 53 kg.  3.  Hyponatremia: improved with HD/UF, advised to limit intake.  4.  Anemia: Hemoglobin and hematocrit currently within acceptable range   5.  Secondary hyperparathyroidism: We will follow calcium/phosphorus levels. Stable   6.  Protein calorie malnutrition: Significant hypoalbuminemia noted most possibly arising from nonhealing leg ulcer/acute phase reactant.  Continue renal vitamin/ONS.  7.  Hallucinations: off of Lomotil and metronidazole   Subjective:   Seen on HD.  Hypotension has limited UF.  BP 90/58.  HR 101.  Reviewed outpatient records and target weight is listed as 53 kg.  Last charted weight 51.8 kg, below target weight as recently as 4/20.   Review of systems:  Denies shortness of breath or chest pain  Denies nausea or vomiting    Objective:   BP (!) (P) 89/56   Pulse (!) (P) 115   Temp 97.8 F (36.6 C) (Oral)   Resp (!) 24   Ht 5\' 4"  (1.626 m)   Wt 51.8 kg   SpO2 99%   BMI 19.60 kg/m   Physical Exam: Gen: adult female in bed in NAD CVS: S1S2; no rub Resp: clear to auscultation bilaterally; normal work of breathing Abd: Soft, flat, nontender Ext: no lower extremity edema; right AVF  Labs: BMET Recent Labs  Lab 06/11/18 0640 06/12/18 0431 06/13/18 0401 06/14/18 0736 06/15/18 0406 06/16/18 0537  NA 133* 132* 136 136 138 138  K 4.0 3.9 4.5 4.2 3.6 4.4  CL 93* 92* 98 95* 96* 96*  CO2 23 23 25 24 27 23   GLUCOSE 127* 102* 109* 105* 95 120*  BUN 55* 63* 28* 36* 19 26*  CREATININE 5.91* 6.73* 3.97* 4.81* 3.00* 3.91*  CALCIUM 8.5* 8.2* 8.1* 8.1* 7.9* 8.0*  PHOS  --   --   --  4.1  --  4.1   CBC Recent Labs   Lab 06/14/18 0736 06/15/18 0655 06/16/18 0537 06/17/18 0452  WBC 17.2* 19.6* 15.7* 11.4*  HGB 9.6* 10.7* 10.0* 10.7*  HCT 30.6* 33.1* 32.8* 35.4*  MCV 85.2 83.8 87.7 84.5  PLT 163 134* 139* 130*   Medications:    . amiodarone  200 mg Oral BID  . atorvastatin  20 mg Oral QHS  . Chlorhexidine Gluconate Cloth  6 each Topical Q0600  . Chlorhexidine Gluconate Cloth  6 each Topical Q0600  . collagenase   Topical Daily  . feeding supplement (NEPRO CARB STEADY)  237 mL Oral BID BM  . metoprolol tartrate  25 mg Oral BID  . multivitamin  1 tablet Oral QHS  . potassium chloride  20 mEq Oral BID    Claudia Desanctis 06/17/2018

## 2018-06-17 NOTE — Progress Notes (Addendum)
Progress Note  Patient Name: Vanessa Webb Date of Encounter: 06/17/2018  Primary Cardiologist: Carlyle Dolly, MD   Subjective   Pt denies CP or dyspnea  Inpatient Medications    Scheduled Meds:  amiodarone  200 mg Oral BID   atorvastatin  20 mg Oral QHS   Chlorhexidine Gluconate Cloth  6 each Topical Q0600   Chlorhexidine Gluconate Cloth  6 each Topical Q0600   collagenase   Topical Daily   feeding supplement (NEPRO CARB STEADY)  237 mL Oral BID BM   metoprolol tartrate  25 mg Oral BID   multivitamin  1 tablet Oral QHS   potassium chloride  20 mEq Oral BID   Continuous Infusions:  ceFEPime (MAXIPIME) IV     PRN Meds: acetaminophen **OR** acetaminophen, acetaminophen, antiseptic oral rinse, oxyCODONE   Vital Signs    Vitals:   06/16/18 2029 06/16/18 2207 06/17/18 0536 06/17/18 0606  BP: 102/65 105/60 (!) 111/92   Pulse: (!) 103 97 (!) 115   Resp: (!) 21  (!) 25   Temp: (!) 97.4 F (36.3 C)   (!) 97.2 F (36.2 C)  TempSrc: Oral   Axillary  SpO2: 98%  100%   Weight:   51.8 kg   Height:        Intake/Output Summary (Last 24 hours) at 06/17/2018 0752 Last data filed at 06/17/2018 0962 Gross per 24 hour  Intake 500 ml  Output 650 ml  Net -150 ml   Last 3 Weights 06/17/2018 06/16/2018 06/15/2018  Weight (lbs) 114 lb 3.2 oz 115 lb 4.8 oz 114 lb 10.2 oz  Weight (kg) 51.8 kg 52.3 kg 52 kg      Telemetry    Atrial fibrillation rate controlled- Personally Reviewed  Physical Exam   GEN: No acute distress.  WD/WN Neck: supple Cardiac: irregular, no murmur Respiratory: diminished BS RLL, chest tube in place GI: Soft, NT/ND MS: No edema Neuro:  Grossly intactt   Labs    Chemistry Recent Labs  Lab 06/12/18 0431  06/13/18 2041 06/14/18 0736 06/15/18 0406 06/16/18 0537  NA 132*   < >  --  136 138 138  K 3.9   < >  --  4.2 3.6 4.4  CL 92*   < >  --  95* 96* 96*  CO2 23   < >  --  24 27 23   GLUCOSE 102*   < >  --  105* 95 120*  BUN 63*   <  >  --  36* 19 26*  CREATININE 6.73*   < >  --  4.81* 3.00* 3.91*  CALCIUM 8.2*   < >  --  8.1* 7.9* 8.0*  PROT 5.8*  --  6.2*  --   --   --   ALBUMIN 1.8*  --  1.7* 1.5*  --  1.6*  AST 42*  --  64*  --   --   --   ALT 23  --  31  --   --   --   ALKPHOS 74  --  73  --   --   --   BILITOT 0.9  --  0.7  --   --   --   GFRNONAA 6*   < >  --  8* 15* 11*  GFRAA 6*   < >  --  10* 17* 12*  ANIONGAP 17*   < >  --  17* 15 19*   < > = values in  this interval not displayed.     Hematology Recent Labs  Lab 06/15/18 0655 06/16/18 0537 06/17/18 0452  WBC 19.6* 15.7* 11.4*  RBC 3.95 3.74* 4.19  HGB 10.7* 10.0* 10.7*  HCT 33.1* 32.8* 35.4*  MCV 83.8 87.7 84.5  MCH 27.1 26.7 25.5*  MCHC 32.3 30.5 30.2  RDW 17.0* 17.6* 17.5*  PLT 134* 139* 130*     BNP Recent Labs  Lab 06/11/18 1113  BNP 1,119.1*      Radiology    Ct Chest Wo Contrast  Result Date: 06/15/2018 CLINICAL DATA:  Bilateral pleural effusions. EXAM: CT CHEST WITHOUT CONTRAST TECHNIQUE: Multidetector CT imaging of the chest was performed following the standard protocol without IV contrast. COMPARISON:  06/13/2018 FINDINGS: Cardiovascular: The heart size is normal. No substantial pericardial effusion. Coronary artery calcification is evident. Atherosclerotic calcification is noted in the wall of the thoracic aorta. Prominence of the main pulmonary arteries suggests pulmonary arterial hypertension. Mediastinum/Nodes: Upper normal mediastinal lymph nodes are again noted. Assessment right hilum limited by lack of intravenous contrast and adjacent collapse/consolidation. No gross bulky left hilar lymphadenopathy. There is no axillary lymphadenopathy. Gas and residual contrast material in the esophagus may be related to reflux or dysmotility. Lungs/Pleura: The central tracheobronchial airways are patent. Collapse/consolidation of the right middle and lower lobes is similar to prior. 9 mm left upper lobe pulmonary nodule is similar to  prior. Dependent left lower lobe atelectasis is stable. Large right pleural effusion is stable to potentially minimally decreased in the interval. The diffuse apparent pleural thickening in the right hemithorax is similar to prior. Small left pleural effusion is stable to minimally progressed. Upper Abdomen: Nodular contour of the liver suggest cirrhosis. Musculoskeletal: Heterogeneous mineralization of bony anatomy is unchanged. Comminuted proximal left humerus fracture again noted. IMPRESSION: 1. No substantial interval change in the large apparently loculated right pleural effusion with associated pleural thickening in the right hemithorax. 2. Similar appearance of the small dependent left lower lobe effusion. 3. Marked collapse/consolidation of the right middle and lower lobes with some dependent atelectasis in the left base. 4. Similar appearance 8-9 mm left upper lobe pulmonary nodule. 5. Prominence of the main pulmonary arteries suggests pulmonary arterial hypertension. 6. Nodular hepatic contour suggests cirrhosis. 7.  Aortic Atherosclerois (ICD10-170.0) Electronically Signed   By: Misty Stanley M.D.   On: 06/15/2018 15:20   Ct Image Guided Fluid Drain By Catheter  Result Date: 06/16/2018 INDICATION: 73 year old with multiple medical problems including a right chest empyema. Request for chest tube placement. EXAM: CT-GUIDED RIGHT CHEST TUBE PLACEMENT MEDICATIONS: No antibiotics were given for this procedure. ANESTHESIA/SEDATION: Fentanyl 50 mcg IV; Versed 1.0 mg IV Moderate Sedation Time:  19 minutes The patient was continuously monitored during the procedure by the interventional radiology nurse under my direct supervision. COMPLICATIONS: None immediate. PROCEDURE: Informed written consent was obtained from the patient after a thorough discussion of the procedural risks, benefits and alternatives. All questions were addressed. Maximal Sterile Barrier Technique was utilized including caps, mask, sterile  gowns, sterile gloves, sterile drape, hand hygiene and skin antiseptic. A timeout was performed prior to the initiation of the procedure. Patient was placed supine on the CT scanner. Images through the chest were obtained. The right mid axillary region was marked with CT localization. The right mid axillary region was prepped and draped in sterile fashion. Skin and soft tissues were anesthetized with 1% lidocaine. Yueh catheter was directed into the right pleural space using CT guidance. Initially, bloody fluid was aspirated. Stiff  Amplatz wire was advanced into the pleural collection and the tract was dilated to accommodate a 14 [place] multipurpose drain. Subsequently, the yellow pleural fluid was aspirated. Catheter was sutured to skin and attached to a PleurEvac device. No additional fluid was sent for analysis. FINDINGS: Bilateral pleural effusions, right side greater than left. Again noted is irregular right pleural thickening. Collapse of the right lower lobe. Air-fluid pockets in the right pleural space following chest tube placement and findings are compatible with a complex or loculated effusion. IMPRESSION: CT-guided placement of a chest tube within the loculated right pleural effusion. Moderate sized left pleural effusion. Electronically Signed   By: Markus Daft M.D.   On: 06/16/2018 12:46    Patient Profile     Ms. Fittro is a 73 y.o. female with a history of paroxysmal atrial fibrillation, chronic anticoagulation therapy with Coumadin, ESRD on hemodialysis (T/Th/Sat), chronic anemia, chronicthrombocytopenia,type 2 diabetes mellitus, hypertension, and hyperlipidemiawho is being seen for the evaluation ofatrial fibrillation with RVRat the request of Dr. Cruzita Lederer (Internal Medicine). Also with empyema.  Assessment & Plan    1 persistent atrial fibrillation-patient's heart rate is upper normal to mildly increased.  Continue metoprolol.  Continue amiodarone load.  Coumadin was  discontinued/reversed to allow chest tube placement.  Would resume once all procedures complete.  We will then plan elective cardioversion 3 weeks after INR therapeutic.    2 pleural effusion/empyema-status post chest tube placement.  Per primary care.  3 end-stage renal disease-patient followed by nephrology.  4 hypertension-patient's blood pressure is controlled.  Continue present medications and follow.  5 hyperlipidemia-continue statin.  For questions or updates, please contact [place] Please consult [email or web address] for contact info under        Signed, Kirk Ruths, MD  06/17/2018, 7:52 AM

## 2018-06-17 NOTE — Progress Notes (Signed)
Patient returned from HD at 1855hrs, set up for dinner.

## 2018-06-17 NOTE — TOC Initial Note (Addendum)
Transition of Care [facility]) - Initial/Assessment Note    Patient Details  Name: Vanessa Webb MRN: [id number] Date of Birth: Apr 02, 1945  [facility]) CM/SW Contact:    Bethena Roys, RN Phone Number: 06/17/2018, 12:40 PM  Clinical Narrative:  Pt presented for generalized weakness and atrial fib. Right thoracentesis 06-13-18-Pt s/p chest tube to suction 06-16-18 for right empyema. PTA from home with spouse and support of daughter. Patient previously had [place] services with [facility]-pt wants to continue services. CM did call Dan with [facility] and patient has PT for services. Pt has PCP in [place] and uses [facility] in [place]. No problems getting to appointments or getting medications.  CM will continue to monitor for additional transition of care needs.                 Expected Discharge Plan: [place] Barriers to Discharge: Continued Medical Work up   Patient Goals and CMS Choice Patient states their goals for this hospitalization and ongoing recovery are:: (to feel better) CMS Medicare.gov Compare Post Acute Care list provided to:: (Previously active with [facility]) Choice offered to / list presented to : Patient  Expected Discharge Plan and Services Expected Discharge Plan: [place] In-house Referral: NA Discharge Planning Services: CM Consult Post Acute Care Choice: [place] arrangements for the past 2 months: Single Family Home                 DME Arranged: N/A DME Agency: NA HH Arranged: RN, Disease Management, PT- Per agency only PT services.  [place] Agency: [place] ([facility])  Prior Living Arrangements/Services Living arrangements for the past 2 months: Single Family Home Lives with:: Adult Children, Spouse Patient language and need for interpreter reviewed:: Yes Do you feel safe going back to the place where you live?: Yes      Need for Family Participation in Patient Care: Yes  (Comment) Care giver support system in place?: Yes (comment)  Criminal Activity/Legal Involvement Pertinent to Current Situation/Hospitalization: No - Comment as needed  Activities of Daily Living Home Assistive Devices/Equipment: None ADL Screening (condition at time of admission) Patient's cognitive ability adequate to safely complete daily activities?: Yes Is the patient deaf or have difficulty hearing?: No Does the patient have difficulty seeing, even when wearing glasses/contacts?: No Does the patient have difficulty concentrating, remembering, or making decisions?: No Patient able to express need for assistance with ADLs?: Yes Does the patient have difficulty dressing or bathing?: Yes Independently performs ADLs?: Yes (appropriate for developmental age) Does the patient have difficulty walking or climbing stairs?: Yes Weakness of Legs: Both Weakness of Arms/Hands: Both  Permission Sought/Granted Permission sought to share information with : Family Supports                Emotional Assessment Appearance:: Appears stated age Attitude/Demeanor/Rapport: Engaged Affect (typically observed): Accepting Orientation: : Oriented to Self, Oriented to Place, Oriented to  Time, Oriented to Situation Alcohol / Substance Use: Not Applicable Psych Involvement: No (comment)  Admission diagnosis:  atrial fib renal failure Patient Active Problem List   Diagnosis Date Noted  . Diarrhea 06/17/2018  . Empyema of pleural space ([place]) 06/15/2018  . Left humeral fracture 06/15/2018  . Chronic combined systolic and diastolic congestive heart failure ([place])   . Pleural effusion, bilateral   . Mitral regurgitation   . Pressure injury of skin 06/11/2018  . Closed right hip fracture, initial encounter ([place])  10/19/2017  . HTN (hypertension) 10/19/2017  . S/P right hip fracture 10/19/2017  . Hyperlipidemia 10/18/2016  . Paroxysmal SVT (supraventricular tachycardia) ([place]) 10/18/2016  . PAF  (paroxysmal atrial fibrillation) ([place])   . Atherosclerosis of native arteries of the extremities with ulceration ([place]) 03/18/2015  . ESRD (end stage renal disease) on dialysis ([place]) 08/26/2012  . 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 10/21/2011   PCP:  Monico Blitz, MD Pharmacy:   [facility] [place], [address] [place] [address] Phone: [phone] Fax: [phone]     Social Determinants of Health (SDOH) Interventions    Readmission Risk Interventions No flowsheet data found.

## 2018-06-17 NOTE — Care Management Important Message (Signed)
Important Message  Patient Details  Name: Vanessa Webb MRN: [id number] Date of Birth: 02/01/1946   Medicare Important Message Given:  Yes    Tyeisha Dinan [address] 06/17/2018, 3:11 PM

## 2018-06-18 ENCOUNTER — Inpatient Hospital Stay (HOSPITAL_COMMUNITY): Payer: Medicare Other | Admitting: Registered Nurse

## 2018-06-18 ENCOUNTER — Inpatient Hospital Stay (HOSPITAL_COMMUNITY): Payer: Medicare Other

## 2018-06-18 ENCOUNTER — Encounter (HOSPITAL_COMMUNITY): Payer: Self-pay | Admitting: Thoracic Surgery (Cardiothoracic Vascular Surgery)

## 2018-06-18 ENCOUNTER — Encounter (HOSPITAL_COMMUNITY)
Admission: AD | Disposition: A | Discharge status: Skilled Nursing Facility | Payer: Self-pay | Source: Other Acute Inpatient Hospital | Attending: Student

## 2018-06-18 DIAGNOSIS — J869 Pyothorax without fistula: Secondary | ICD-10-CM

## 2018-06-18 DIAGNOSIS — J9819 Other pulmonary collapse: Secondary | ICD-10-CM

## 2018-06-18 HISTORY — DX: Other pulmonary collapse: J98.19

## 2018-06-18 HISTORY — PX: VIDEO ASSISTED THORACOSCOPY (VATS)/EMPYEMA: SHX6172

## 2018-06-18 LAB — GLUCOSE, CAPILLARY
Glucose-Capillary: 93 mg/dL (ref 70–99)
Glucose-Capillary: 99 mg/dL (ref 70–99)
Glucose-Capillary: 111 mg/dL — ABNORMAL HIGH (ref 70–99)

## 2018-06-18 LAB — CBC
HCT: 30.8 % — ABNORMAL LOW (ref 36.0–46.0)
Hemoglobin: 9.7 g/dL — ABNORMAL LOW (ref 12.0–15.0)
MCH: 26.3 pg (ref 26.0–34.0)
MCHC: 31.5 g/dL (ref 30.0–36.0)
MCV: 83.5 fL (ref 80.0–100.0)
Platelets: 112 10*3/uL — ABNORMAL LOW (ref 150–400)
RBC: 3.69 MIL/uL — ABNORMAL LOW (ref 3.87–5.11)
RDW: 17.4 % — ABNORMAL HIGH (ref 11.5–15.5)
WBC: 9.2 10*3/uL (ref 4.0–10.5)
nRBC: 0 % (ref 0.0–0.2)

## 2018-06-18 LAB — RENAL FUNCTION PANEL
Albumin: 1.6 g/dL — ABNORMAL LOW (ref 3.5–5.0)
Anion gap: 15 (ref 5–15)
BUN: 16 mg/dL (ref 8–23)
CO2: 25 mmol/L (ref 22–32)
Calcium: 8.1 mg/dL — ABNORMAL LOW (ref 8.9–10.3)
Chloride: 98 mmol/L (ref 98–111)
Creatinine, Ser: 2.82 mg/dL — ABNORMAL HIGH (ref 0.44–1.00)
GFR calc Af Amer: 18 mL/min — ABNORMAL LOW (ref >60)
GFR calc non Af Amer: 16 mL/min — ABNORMAL LOW (ref >60)
Glucose, Bld: 102 mg/dL — ABNORMAL HIGH (ref 70–99)
Phosphorus: 2.9 mg/dL (ref 2.5–4.6)
Potassium: 4.2 mmol/L (ref 3.5–5.1)
Sodium: 138 mmol/L (ref 135–145)

## 2018-06-18 LAB — PROTIME-INR
INR: 1.6 — ABNORMAL HIGH (ref 0.8–1.2)
Prothrombin Time: 19.1 seconds — ABNORMAL HIGH (ref 11.4–15.2)

## 2018-06-18 LAB — MAGNESIUM: Magnesium: 1.8 mg/dL (ref 1.7–2.4)

## 2018-06-18 SURGERY — VIDEO ASSISTED THORACOSCOPY (VATS)/EMPYEMA
Anesthesia: General | Site: Chest | Laterality: Right

## 2018-06-18 MED ORDER — PHENYLEPHRINE 40 MCG/ML (10ML) SYRINGE FOR IV PUSH (FOR BLOOD PRESSURE SUPPORT)
PREFILLED_SYRINGE | INTRAVENOUS | Status: DC | PRN
  Administered 2018-06-18 (×9): 80 ug via INTRAVENOUS

## 2018-06-18 MED ORDER — MIDAZOLAM HCL 2 MG/2ML IJ SOLN
INTRAMUSCULAR | Status: AC
  Filled 2018-06-18: qty 2

## 2018-06-18 MED ORDER — ONDANSETRON HCL 4 MG/2ML IJ SOLN
INTRAMUSCULAR | Status: AC
  Filled 2018-06-18: qty 2

## 2018-06-18 MED ORDER — CEFAZOLIN SODIUM 1 G IJ SOLR
INTRAMUSCULAR | Status: AC
  Filled 2018-06-18: qty 20

## 2018-06-18 MED ORDER — SODIUM CHLORIDE 0.9 % IV SOLN
INTRAVENOUS | Status: DC
  Administered 2018-06-18: 13:00:00 via INTRAVENOUS

## 2018-06-18 MED ORDER — ONDANSETRON HCL 4 MG/2ML IJ SOLN
INTRAMUSCULAR | Status: DC | PRN
  Administered 2018-06-18: 4 mg via INTRAVENOUS

## 2018-06-18 MED ORDER — GLYCOPYRROLATE PF 0.2 MG/ML IJ SOSY
PREFILLED_SYRINGE | INTRAMUSCULAR | Status: DC | PRN
  Administered 2018-06-18: 0.6 mg via INTRAVENOUS

## 2018-06-18 MED ORDER — 0.9 % SODIUM CHLORIDE (POUR BTL) OPTIME
TOPICAL | Status: DC | PRN
  Administered 2018-06-18: 2000 mL

## 2018-06-18 MED ORDER — ROCURONIUM BROMIDE 10 MG/ML (PF) SYRINGE
PREFILLED_SYRINGE | INTRAVENOUS | Status: DC | PRN
  Administered 2018-06-18: 10 mg via INTRAVENOUS
  Administered 2018-06-18: 30 mg via INTRAVENOUS

## 2018-06-18 MED ORDER — BISACODYL 5 MG PO TBEC
10.0000 mg | DELAYED_RELEASE_TABLET | Freq: Every day | ORAL | Status: DC
  Administered 2018-06-19: 10:00:00 10 mg via ORAL
  Filled 2018-06-18 (×4): qty 2

## 2018-06-18 MED ORDER — VANCOMYCIN HCL 1000 MG IV SOLR
INTRAVENOUS | Status: AC
  Filled 2018-06-18: qty 1000

## 2018-06-18 MED ORDER — ONDANSETRON HCL 4 MG/2ML IJ SOLN
4.0000 mg | Freq: Once | INTRAMUSCULAR | Status: AC
  Administered 2018-06-18: 10:00:00 4 mg via INTRAVENOUS

## 2018-06-18 MED ORDER — CEFAZOLIN SODIUM-DEXTROSE 2-3 GM-%(50ML) IV SOLR
INTRAVENOUS | Status: DC | PRN
  Administered 2018-06-18: 2 g via INTRAVENOUS

## 2018-06-18 MED ORDER — DEXAMETHASONE SODIUM PHOSPHATE 10 MG/ML IJ SOLN
INTRAMUSCULAR | Status: DC | PRN
  Administered 2018-06-18: 4 mg via INTRAVENOUS

## 2018-06-18 MED ORDER — SENNOSIDES-DOCUSATE SODIUM 8.6-50 MG PO TABS
1.0000 | ORAL_TABLET | Freq: Every day | ORAL | Status: DC
  Administered 2018-06-19: 1 via ORAL
  Filled 2018-06-18: qty 1

## 2018-06-18 MED ORDER — CHLORHEXIDINE GLUCONATE CLOTH 2 % EX PADS: 6.0000 | MEDICATED_PAD | Freq: Every day | CUTANEOUS | Status: DC

## 2018-06-18 MED ORDER — SUCCINYLCHOLINE CHLORIDE 200 MG/10ML IV SOSY
PREFILLED_SYRINGE | INTRAVENOUS | Status: DC | PRN
  Administered 2018-06-18: 60 mg via INTRAVENOUS

## 2018-06-18 MED ORDER — VANCOMYCIN HCL 500 MG IV SOLR
INTRAVENOUS | Status: AC
  Filled 2018-06-18: qty 500

## 2018-06-18 MED ORDER — METOPROLOL TARTRATE 25 MG PO TABS
25.0000 mg | ORAL_TABLET | Freq: Two times a day (BID) | ORAL | Status: DC
  Administered 2018-06-19 – 2018-06-27 (×11): 25 mg via ORAL
  Filled 2018-06-18 (×16): qty 1

## 2018-06-18 MED ORDER — PHENYLEPHRINE 40 MCG/ML (10ML) SYRINGE FOR IV PUSH (FOR BLOOD PRESSURE SUPPORT)
PREFILLED_SYRINGE | INTRAVENOUS | Status: AC
  Filled 2018-06-18: qty 10

## 2018-06-18 MED ORDER — SODIUM CHLORIDE 0.9 % IV SOLN
INTRAVENOUS | Status: DC | PRN
  Administered 2018-06-18: 09:00:00 20 ug/min via INTRAVENOUS

## 2018-06-18 MED ORDER — ONDANSETRON HCL 4 MG/2ML IJ SOLN
4.0000 mg | Freq: Four times a day (QID) | INTRAMUSCULAR | Status: DC | PRN
  Administered 2018-06-23: 4 mg via INTRAVENOUS
  Filled 2018-06-18: qty 2

## 2018-06-18 MED ORDER — PROPOFOL 10 MG/ML IV BOLUS
INTRAVENOUS | Status: DC | PRN
  Administered 2018-06-18: 60 mg via INTRAVENOUS

## 2018-06-18 MED ORDER — FENTANYL CITRATE (PF) 250 MCG/5ML IJ SOLN
INTRAMUSCULAR | Status: AC
  Filled 2018-06-18: qty 5

## 2018-06-18 MED ORDER — LACTATED RINGERS IV SOLN
INTRAVENOUS | Status: DC | PRN
  Administered 2018-06-18 (×2): via INTRAVENOUS

## 2018-06-18 MED ORDER — PROPOFOL 10 MG/ML IV BOLUS
INTRAVENOUS | Status: AC
  Filled 2018-06-18: qty 20

## 2018-06-18 MED ORDER — LIDOCAINE 2% (20 MG/ML) 5 ML SYRINGE
INTRAMUSCULAR | Status: DC | PRN
  Administered 2018-06-18: 40 mg via INTRAVENOUS

## 2018-06-18 MED ORDER — INSULIN ASPART 100 UNIT/ML ~~LOC~~ SOLN
0.0000 [IU] | Freq: Four times a day (QID) | SUBCUTANEOUS | Status: DC
  Administered 2018-06-19 (×2): 2 [IU] via SUBCUTANEOUS

## 2018-06-18 MED ORDER — GERHARDT'S BUTT CREAM
TOPICAL_CREAM | Freq: Four times a day (QID) | CUTANEOUS | Status: DC
  Administered 2018-06-18 – 2018-06-19 (×2): 1 via TOPICAL
  Administered 2018-06-19 – 2018-06-24 (×15): via TOPICAL
  Administered 2018-06-24 – 2018-06-25 (×2): 1 via TOPICAL
  Administered 2018-06-25 – 2018-06-26 (×2): via TOPICAL
  Administered 2018-06-26: 1 via TOPICAL
  Administered 2018-06-26 – 2018-06-27 (×2): via TOPICAL
  Filled 2018-06-18 (×4): qty 1

## 2018-06-18 MED ORDER — VANCOMYCIN HCL 1000 MG IV SOLR
INTRAVENOUS | Status: DC | PRN
  Administered 2018-06-18: 1000 mg

## 2018-06-18 MED ORDER — TRAMADOL HCL 50 MG PO TABS
50.0000 mg | ORAL_TABLET | Freq: Two times a day (BID) | ORAL | Status: DC | PRN
  Administered 2018-06-27: 50 mg via ORAL
  Filled 2018-06-18: qty 1

## 2018-06-18 MED ORDER — MORPHINE SULFATE (PF) 2 MG/ML IV SOLN: 1.0000 mg | INTRAVENOUS | Status: DC | PRN

## 2018-06-18 MED ORDER — FENTANYL CITRATE (PF) 250 MCG/5ML IJ SOLN
INTRAMUSCULAR | Status: DC | PRN
  Administered 2018-06-18 (×2): 25 ug via INTRAVENOUS

## 2018-06-18 MED ORDER — NEOSTIGMINE METHYLSULFATE 3 MG/3ML IV SOSY
PREFILLED_SYRINGE | INTRAVENOUS | Status: DC | PRN
  Administered 2018-06-18: 3.5 mg via INTRAVENOUS

## 2018-06-18 MED ORDER — ARTIFICIAL TEARS OPHTHALMIC OINT
TOPICAL_OINTMENT | OPHTHALMIC | Status: DC | PRN
  Administered 2018-06-18: 1 via OPHTHALMIC

## 2018-06-18 MED ORDER — EVICEL 5 ML EX KIT
PACK | CUTANEOUS | Status: AC
  Filled 2018-06-18: qty 2

## 2018-06-18 MED ORDER — POTASSIUM CHLORIDE 10 MEQ/50ML IV SOLN: 10.0000 meq | Freq: Every day | INTRAVENOUS | Status: DC | PRN

## 2018-06-18 SURGICAL SUPPLY — 69 items
ADH SKN CLS APL DERMABOND .7 (GAUZE/BANDAGES/DRESSINGS) ×1
APL SRG 22X2 LUM MLBL SLNT (VASCULAR PRODUCTS)
APPLICATOR TIP EXT COSEAL (VASCULAR PRODUCTS) IMPLANT
APPLIER CLIP ROT 10 11.4 M/L (STAPLE)
APR CLP MED LRG 11.4X10 (STAPLE)
CANISTER SUCT 3000ML PPV (MISCELLANEOUS) ×2 IMPLANT
CATH KIT ON Q 5IN SLV (PAIN MANAGEMENT) IMPLANT
CATH THORACIC 28FR (CATHETERS) IMPLANT
CATH THORACIC 36FR (CATHETERS) IMPLANT
CATH THORACIC 36FR RT ANG (CATHETERS) IMPLANT
CELLS DAT CNTRL 66122 CELL SVR (MISCELLANEOUS) ×1 IMPLANT
CLIP APPLIE ROT 10 11.4 M/L (STAPLE) IMPLANT
CONN ST 1/4X3/8  BEN (MISCELLANEOUS)
CONN ST 1/4X3/8 BEN (MISCELLANEOUS) IMPLANT
CONN Y 3/8X3/8X3/8  BEN (MISCELLANEOUS)
CONN Y 3/8X3/8X3/8 BEN (MISCELLANEOUS) IMPLANT
CONT SPEC 4OZ CLIKSEAL STRL BL (MISCELLANEOUS) ×4 IMPLANT
COVER MAYO STAND STRL (DRAPES) ×2 IMPLANT
DERMABOND ADVANCED (GAUZE/BANDAGES/DRESSINGS) ×1
DERMABOND ADVANCED .7 DNX12 (GAUZE/BANDAGES/DRESSINGS) ×1 IMPLANT
DRAIN CHANNEL 32F RND 10.7 FF (WOUND CARE) IMPLANT
DRAPE LAPAROSCOPIC ABDOMINAL (DRAPES) ×2 IMPLANT
ELECT REM PT RETURN 9FT ADLT (ELECTROSURGICAL) ×2
ELECTRODE REM PT RTRN 9FT ADLT (ELECTROSURGICAL) ×1 IMPLANT
FLUID NSS /IRRIG 3000 ML XXX (IV SOLUTION) ×2 IMPLANT
GAUZE SPONGE 4X4 12PLY STRL (GAUZE/BANDAGES/DRESSINGS) ×2 IMPLANT
GAUZE SPONGE 4X4 12PLY STRL LF (GAUZE/BANDAGES/DRESSINGS) ×2 IMPLANT
GLOVE ORTHO TXT STRL SZ7.5 (GLOVE) ×4 IMPLANT
GOWN STRL REUS W/ TWL LRG LVL3 (GOWN DISPOSABLE) ×2 IMPLANT
GOWN STRL REUS W/TWL LRG LVL3 (GOWN DISPOSABLE) ×4
KIT BASIN OR (CUSTOM PROCEDURE TRAY) ×2 IMPLANT
KIT TURNOVER KIT B (KITS) ×2 IMPLANT
NEEDLE 18GX1X1/2 (RX/OR ONLY) (NEEDLE) ×2 IMPLANT
NS IRRIG 1000ML POUR BTL (IV SOLUTION) ×4 IMPLANT
PACK CHEST (CUSTOM PROCEDURE TRAY) ×2 IMPLANT
PAD ARMBOARD 7.5X6 YLW CONV (MISCELLANEOUS) ×4 IMPLANT
RTRCTR WOUND ALEXIS 18CM MED (MISCELLANEOUS) ×2
SEALANT PROGEL (MISCELLANEOUS) IMPLANT
SEALANT SURG COSEAL 4ML (VASCULAR PRODUCTS) IMPLANT
SEALANT SURG COSEAL 8ML (VASCULAR PRODUCTS) IMPLANT
SET IRRIG TUBING LAPAROSCOPIC (IRRIGATION / IRRIGATOR) ×2 IMPLANT
SOLUTION ANTI FOG 6CC (MISCELLANEOUS) ×2 IMPLANT
SUT MNCRL AB 4-0 PS2 18 (SUTURE) ×2 IMPLANT
SUT PROLENE 3 0 SH DA (SUTURE) IMPLANT
SUT PROLENE 4 0 RB 1 (SUTURE)
SUT PROLENE 4-0 RB1 .5 CRCL 36 (SUTURE) IMPLANT
SUT SILK  1 MH (SUTURE) ×1
SUT SILK 1 MH (SUTURE) ×1 IMPLANT
SUT SILK 1 TIES 10X30 (SUTURE) IMPLANT
SUT SILK 2 0SH CR/8 30 (SUTURE) IMPLANT
SUT VIC AB 1 CTX 18 (SUTURE) IMPLANT
SUT VIC AB 1 CTX 36 (SUTURE)
SUT VIC AB 1 CTX36XBRD ANBCTR (SUTURE) IMPLANT
SUT VIC AB 2-0 CT1 27 (SUTURE) ×2
SUT VIC AB 2-0 CT1 TAPERPNT 27 (SUTURE) ×1 IMPLANT
SUT VIC AB 2-0 CTX 27 (SUTURE) ×2 IMPLANT
SUT VIC AB 2-0 CTX 36 (SUTURE) IMPLANT
SUT VIC AB 2-0 UR6 27 (SUTURE) IMPLANT
SUT VIC AB 3-0 SH 8-18 (SUTURE) ×4 IMPLANT
SUT VICRYL 2 TP 1 (SUTURE) ×2 IMPLANT
SYRINGE 20CC LL (MISCELLANEOUS) ×2 IMPLANT
SYSTEM SAHARA CHEST DRAIN RE-I (WOUND CARE) ×2 IMPLANT
TAPE CLOTH 4X10 WHT NS (GAUZE/BANDAGES/DRESSINGS) ×2 IMPLANT
TAPE CLOTH SURG 4X10 WHT LF (GAUZE/BANDAGES/DRESSINGS) ×2 IMPLANT
TIP APPLICATOR SPRAY EXTEND 16 (VASCULAR PRODUCTS) IMPLANT
TOWEL GREEN STERILE (TOWEL DISPOSABLE) ×2 IMPLANT
TRAY FOLEY MTR SLVR 14FR STAT (SET/KITS/TRAYS/PACK) ×2 IMPLANT
TROCAR BLADELESS 5MM (ENDOMECHANICALS) ×2 IMPLANT
WATER STERILE IRR 1000ML POUR (IV SOLUTION) ×4 IMPLANT

## 2018-06-18 NOTE — Progress Notes (Signed)
RN paged [facility] requesting order for Gerhardt's butt cream due to moisture associated skin damage around patient's rectum.

## 2018-06-18 NOTE — Op Note (Addendum)
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  Minimal loculations     BRIEF CLINICAL NOTE AND INDICATIONS FOR SURGERY  Patient is a 73 year old female with multiple chronic medical problems including end-stage renal disease on chronic hemodialysis, previous myocardial infarction, hypertension, chronic diastolic congestive heart failure, mitral regurgitation, type 2 diabetes mellitus, recurrent paroxysmal atrial fibrillation on long-term warfarin anticoagulation, hyperlipidemia, multiple falls with recent fracture of left shoulder and right hip, recently discovered lung nodule, peripheral neuropathy, chronic weight loss, 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  Patient states that she has had anorexia, severe generalized weakness, and a 40 pound weight loss which has gradually progressed over the past year.  She fell and broke her right hip 6 months ago and underwent IM nailing.  She broke her left shoulder 3 months ago and has persistent pain and decreased function of the left arm.  Her shoulder fracture has been treated conservatively.  This past December she was hospitalized for an exacerbation of congestive heart failure.  She complains of chronic progression of generalized weakness.  She eventually was taken to [facility] by her husband because of severe weakness.   She was found to be in rapid atrial fibrillation and a chest x-ray revealed large right pleural effusion.  She was transferred to [facility] for further management.  CT scan of the chest performed June 13, 2018 revealed large right pleural effusion and small left pleural effusion with suggestion of possible loculations on the right.  There was a small (8 mm) nodule in the left upper lobe.  The patient underwent right thoracentesis yielding a small amount (320mL) of blood tinged fluid.  Pleural fluid LDH was 410 with total protein less than 3 and fluid amylase 22. The patient had a portable chest x-ray immediately following thoracentesis which revealed persistent large right pleural effusion and no pneumothorax.  No follow-up imaging has been performed.  Following report of the patient's pleural fluid culture the patient was started on intravenous Unasyn and cardiothoracic surgical consultation was requested.  Pleural fluid cytology was negative for malignancy and pleural fluid cultures grew PSEUDOMONAS AERUGINOSA.  A small chest tube was placed by interventional radiology into the right pleural space but the lung did not reexpand.  The patient has been seen in consultation and counseled at length regarding the treatment options for management of right empyema with persistent lung collapse.  Alternatives discussed included no further intervention, an attempt at fibrinolytic therapy to break up loculations, video-assisted thoracoscopic drainage, and open thoracotomy for decortication.  Because of the patient's numerous chronic medical conditions and extreme debility the patient has not felt to be a candidate for open thoracotomy for decortication.  The indications, risks and potential benefits of video-assisted thoracoscopic drainage were discussed.  All questions have been answered, and the patient provides full informed consent for the operation as described.  DETAILS OF THE  OPERATIVE PROCEDURE  The patient is brought to the operating room on the above mentioned date And placed in the supine position on the operating table. Central venous line and radial arterial line are placed by the anesthesia team. Intravenous antibiotics are administered and pneumatic sequential compression boots are placed on both lower extremities. General endotracheal anesthesia is induced uneventfully.  The patient is turned to the left lateral decubitus position.  Great care was taken during positioning of the patient to be careful with her left shoulder and proximal left arm because of her pre-existing fracture.  An axillary roll is placed and the patient's right chest was prepared and draped in a sterile manner.  A small incision is made overlying the seventh intercostal space and the anterior axillary line. The incision is completed into the pleural space with electrocautery. The pleural space is palpated and a blunt sucker tip is placed into the pleural space to evacuate pleural fluid. A total of 850 mL of fluid are evacuated.  A 5 mm port was passed through the incision and a thoracoscopic camera advanced through the port. The pleural space is examined visually.  There appear to be some loculations and both acute and chronic inflammation.  The port is removed and the small incision extended 2 cm in either direction.  A soft tissue retractor is placed for exposure.  The right chest was explored visually.  All loculations were easily broken up.  There are minimal loculations.  All acute suppurative inflammatory tissue is removed and sent to pathology labeled as pleural peel.  The right chest is irrigated with 3 L of warm saline solution.  The chest is again explored visually with 5 mm thoracoscopic camera.  There is dense fibrosis along the entire visceral surface of the pleura and lung.  Despite 2 lung ventilation the lung will not expand whatsoever.  Anatomical characteristics are consistent with  long-term chronic right pleural effusion with fibrosis of the visceral pleura and trapped lung.  The patient is not felt to be a candidate for open thoracotomy for an attempt at decortication.  The pleural space is drained using a single 32 French Bard chest tube exited through a separate stab incisions. The remaining original incision is closed in multiple layers in routine fashion. The chest tube was fixed to close suction drainage device.  The patient tolerated the procedure well, was extubated in the operative room, and transported to the postanesthesia care in stable condition. All sponge instrument and needle counts are verified correct. There were no intraoperative complications. Estimated blood loss was trivial.    Valentina Gu. Roxy Manns MD 06/18/2018 9:28 AM

## 2018-06-18 NOTE — Anesthesia Procedure Notes (Addendum)
Procedure Name: Intubation Date/Time: 06/18/2018 8:16 AM Performed by: Jearld Pies, CRNA Pre-anesthesia Checklist: Patient identified, Emergency Drugs available, Suction available and Patient being monitored Patient Re-evaluated:Patient Re-evaluated prior to induction Oxygen Delivery Method: Circle System Utilized Preoxygenation: Pre-oxygenation with 100% oxygen Induction Type: IV induction and Rapid sequence Laryngoscope Size: Glidescope and 3 Grade View: Grade II Tube type: Oral Tube size: 7.0 mm Number of attempts: 2 Airway Equipment and Method: Stylet and Oral airway Placement Confirmation: ETT inserted through vocal cords under direct vision,  positive ETCO2 and breath sounds checked- equal and bilateral Secured at: 22 cm Tube secured with: Tape Dental Injury: Teeth and Oropharynx as per pre-operative assessment  Comments: DL x 1 with Sabra Heck 2, Grade 2b view with copious secretions, unable to pass ETT, Glidescope utilized with GII view.

## 2018-06-18 NOTE — Progress Notes (Addendum)
Patient ID: Vanessa Webb, female   DOB: Jan 14, 1946, 73 y.o.   MRN: [id number] [facility] Progress Note   Assessment/ Plan:   1.  Atrial fibrillation with rapid ventricular response:  per cardiology; metoprolol and amio.  For future elective cardioversion per cardiology.    2.  End-stage renal disease: HD per TTS schedule.  Normally at [place].  Note dry weight appears to be 53 kg on outpatient records.  Noted order for scheduled K supplement - discontinued.   3. Pleural effusion with empyema - s/p VATS  4.  Hyponatremia: improved with HD/UF, advised to limit intake.   5. 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     5.  Secondary hyperparathyroidism: We will follow calcium/phosphorus levels. Stable   6.  Protein calorie malnutrition: Significant hypoalbuminemia noted most possibly arising from nonhealing leg ulcer/acute phase reactant.  Continue renal vitamin/ONS.  7.  Hallucinations: off of Lomotil and metronidazole   Subjective:   She had a VATS earlier today and tolerated well per nursing.  Last HD on 4/21 with under 500 mL UF.  She was below her reported dry weight and UF was decreased.  She clarifies that she is not on potassium supplement at home.  Review of systems:  Denies shortness of breath or chest pain  Denies nausea or vomiting    Objective:   BP (!) 95/58   Pulse (!) 104   Temp (!) 97 F (36.1 C)   Resp (!) 23   Ht 5\' 4"  (1.626 m)   Wt 52 kg   SpO2 100%   BMI 19.68 kg/m   Physical Exam: Gen: adult female in bed in NAD CVS: S1S2; no rub Resp: chest tube in place right chest; decreased breath sounds; left lung clear; unlabored Abd: Soft, flat, nontender Ext: no lower extremity edema; right AVF  Labs: BMET Recent Labs  Lab 06/12/18 0431 06/13/18 0401 06/14/18 0736 06/15/18 0406 06/16/18 0537 06/17/18 2121 06/18/18 0403  NA 132* 136 136 138 138 137 138  K 3.9 4.5 4.2 3.6 4.4 4.6 4.2  CL 92* 98 95* 96* 96* 98 98  CO2 23 25 24 27 23 25 25    GLUCOSE 102* 109* 105* 95 120* 118* 102*  BUN 63* 28* 36* 19 26* 14 16  CREATININE 6.73* 3.97* 4.81* 3.00* 3.91* 2.53* 2.82*  CALCIUM 8.2* 8.1* 8.1* 7.9* 8.0* 7.9* 8.1*  PHOS  --   --  4.1  --  4.1 2.5 2.9   CBC Recent Labs  Lab 06/15/18 0655 06/16/18 0537 06/17/18 0452 06/18/18 0403  WBC 19.6* 15.7* 11.4* 9.2  HGB 10.7* 10.0* 10.7* 9.7*  HCT 33.1* 32.8* 35.4* 30.8*  MCV 83.8 87.7 84.5 83.5  PLT 134* 139* 130* 112*   Medications:    . amiodarone  200 mg Oral BID  . atorvastatin  20 mg Oral QHS  . bisacodyl  10 mg Oral Daily  . collagenase   Topical Daily  . feeding supplement (NEPRO CARB STEADY)  237 mL Oral BID BM  . Gerhardt's butt cream   Topical QID  . insulin aspart  0-24 Units Subcutaneous Q6H  . metoprolol tartrate  25 mg Oral BID  . multivitamin  1 tablet Oral QHS  . ondansetron      . potassium chloride  20 mEq Oral BID  . senna-docusate  1 tablet Oral QHS    Claudia Desanctis 06/18/2018

## 2018-06-18 NOTE — Anesthesia Procedure Notes (Signed)
Arterial Line Insertion Start/End4/22/2020 6:50 AM, 06/18/2018 7:02 AM Performed by: Jearld Pies, CRNA, CRNA  Patient location: Pre-op. Preanesthetic checklist: patient identified, IV checked, site marked, risks and benefits discussed, surgical consent, monitors and equipment checked, pre-op evaluation, timeout performed and anesthesia consent Lidocaine 1% used for infiltration Left, radial was placed Catheter size: 20 G Hand hygiene performed , maximum sterile barriers used  and Seldinger technique used Allen's test indicative of satisfactory collateral circulation Attempts: 2 Procedure performed using ultrasound guided technique. Ultrasound Notes:anatomy identified, needle tip was noted to be adjacent to the nerve/plexus identified and no ultrasound evidence of intravascular and/or intraneural injection Following insertion, dressing applied and Biopatch. Post procedure assessment: normal  Patient tolerated the procedure well with no immediate complications.

## 2018-06-18 NOTE — Anesthesia Procedure Notes (Signed)
Central Venous Catheter Insertion Performed by: Oleta Mouse, MD, anesthesiologist Start/End4/22/2020 7:18 AM, 06/18/2018 7:25 AM Patient location: Pre-op. Preanesthetic checklist: patient identified, IV checked, site marked, risks and benefits discussed, surgical consent, monitors and equipment checked, pre-op evaluation, timeout performed and anesthesia consent Lidocaine 1% used for infiltration and patient sedated Hand hygiene performed  and maximum sterile barriers used  Catheter size: 8 Fr Total catheter length 16. Central line was placed.Double lumen Procedure performed using ultrasound guided technique. Ultrasound Notes:image(s) printed for medical record Attempts: 1 Following insertion, dressing applied, line sutured and Biopatch. Post procedure assessment: blood return through all ports, no air and free fluid flow  Patient tolerated the procedure well with no immediate complications.

## 2018-06-18 NOTE — Progress Notes (Signed)
      Vanessa Webb       Webb,Vanessa [address]             [phone]     CARDIOTHORACIC SURGERY PROGRESS NOTE  Subjective: Vanessa Webb has been scheduled for Procedure(s): VIDEO ASSISTED THORACOSCOPY (VATS)/EMPYEMA (Right) today.   Objective: Vital signs in last 24 hours: Temp:  [97.4 F (36.3 C)-98.4 F (36.9 C)] 97.6 F (36.4 C) (04/22 0530) Pulse Rate:  [78-133] 110 (04/22 0530) Cardiac Rhythm: Atrial fibrillation (04/21 2001) Resp:  [16-24] 20 (04/22 0530) BP: (80-114)/(51-74) 112/67 (04/22 0530) SpO2:  [94 %-99 %] 98 % (04/22 0530) Weight:  [52 kg-52.7 kg] 52 kg (04/22 0530)  Physical Exam: Unchanged from previously   Intake/Output from previous day: 04/21 0701 - 04/22 0700 In: 535 [P.O.:535] Out: 609 [Stool:1; Chest Tube:240] Intake/Output this shift: Total I/O In: 200 [P.O.:200] Out: 120 [Chest Tube:120]  Lab Results: Recent Labs    06/17/18 0452 06/18/18 0403  WBC 11.4* 9.2  HGB 10.7* 9.7*  HCT 35.4* 30.8*  PLT 130* 112*   BMET:  Recent Labs    06/17/18 2121 06/18/18 0403  NA 137 138  K 4.6 4.2  CL 98 98  CO2 25 25  GLUCOSE 118* 102*  BUN 14 16  CREATININE 2.53* 2.82*  CALCIUM 7.9* 8.1*    CBG (last 3)  Recent Labs    06/17/18 1153 06/17/18 2120 06/18/18 0605  GLUCAP 78 106* 93   PT/INR:   Recent Labs    06/18/18 0403  LABPROT 19.1*  INR 1.6*    Assessment/Plan:   The various methods of treatment have been discussed with the patient. After consideration of the risks, benefits and treatment options the patient has consented to the planned procedure.   The patient has been seen and labs reviewed. There are no changes in the patient's condition to prevent proceeding with the planned procedure today.   Rexene Alberts, MD 06/18/2018 6:37 AM

## 2018-06-18 NOTE — Progress Notes (Signed)
PROGRESS NOTE    Vanessa Webb  YJE:[id number] DOB: 08/09/1945 DOA: 06/11/2018 PCP: Monico Blitz, MD    Brief Narrative:  73 year old female with history of ESRD on HD TTS, hypertension, hyperlipidemia, A. fib on Coumadin for anticoagulation and chronic right leg ulcer transferred from [facility] for hemodialysis.  Patient presented to [facility] with 2 weeks of generalized weakness and found to be in A. fib with RVR.  She was placed on diltiazem drip transition to amiodarone prior to transfer. Patient is dialysis on 4/14 due to weakness.  Reports good compliance with her metoprolol.  Off note, patient has had poor appetite, generalized weakness and about 40 pound weight loss over the last 1 year.  She fell and broke her right hip 6 months ago and underwent IM nailing.  She also fell and broke her left shoulder 3 months ago on the way to dialysis that has been treated conservatively.  Patient remained in A. fib with RVR on amiodarone drip. Initially, plan for DCCV if heart rate is not under good control.  This has changed after talking to CTS about the pleural effusion we took the precedence, and her heart also improved.  Large right-sided pleural effusion noted on chest x-ray and CT chest.  These appear to be chronic. She had US-guided diagnostic and therapeutic right thoracocentesis on 4/17 that yielded 300 cc exudative fluid by LDH.  Pleural fluid Gram stain and culture with GNR.  AFB smear negative.  AFB culture and cytology pending.  Started on Unasyn and Flagyl.  Cardiothoracic surgery consulted on 4/19 recommended stopping warfarin, INR reversal and chest tube placement by IR.  Pleural fluid culture speciated to Pseudomonas aeruginosa.  Transitioned to cefepime on 06/16/2018.  Patient had chest tube placement by IR on 06/16/2018.   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  Gram stain and cultures grew Pseudomonas.  - Gram stain negative.   -Fluid cytology consistent with acute inflammation but negative for malignancy.   -AFB culture pending.   - CTS consulted on 4/19 and recommend repeat CT chest that showed persistent reportedly loculated large pleural effusion on the right side. - Pt underwent VATS on 4/22 with findings of chronic trapped lung with dense fibrosis of visceral pleura and minimal loculations -Given Ceftriaxone 4/18-4/19 -Given Unasyn and Flagyl 4/19--4/20 -Continued on Cefepime 4/20--for pseudomonal coverage -CTS following-continue chest tube, fibrinolytics, s/p VATS per above -AFB pending  Paroxysmal atrial fibrillation with RVR:  - Chads vascular score 5.  Has bled score 3.  Echo as below.  Thyroid panel within normal range. -Amiodarone and metoprolol per cardiology -Warfarin on hold. -Cardiology had planned for possible DCCV in 3 weeks after chest tube -Repeat bmet in AM  Acute on chronic combined CHF:  - Echo this admission showed LVEF of 40-45% (down from 55-60% in 02/2016) with diffuse hypokinesis. Reduced EF likely due to RVR.  -Volume managed by HD  Leukocytosis:  -  Multiple potential sources including possible parapneumonic pleural effusion, pressure injury of the skin, RLE ulcer and stress.  Pressure skin injury and RLE ulcer appears to be clean.  Improving. -Continue antibiotic as above -Manage pleural effusion as above -Repeat CBC in AM  Pulmonary  nodule/mild right paratracheal lymphadenopathy: 8 mm LUL pulmonary nodule noted on CT chest. -Outpatient CT follow-up in 3 to 6 months for the nodule -Pleural fluid cytology negative for malignancy.   Diarrhea:  -Pt takes Imodium at home.   -continue anti-diarrheal as needed  Recurrent fall/right hip fracture/comminuted left proximal humerus fracture -Status post IM nailing of right hip 6 months ago -Left shoulder being managed conservatively -Seems stable  Hypertension: -Manage as above. -BP stable at present, vitals reviewed  ESRD on HD TTS/anemia of chronic disease/mineral bone disease -Nephrology managing.  Right lower extremity wound:  -followed by wound care outpatient.  Reportedly improving per patient. -Appreciate wound care input -Recommend continued outpatient follow-up.  Well-controlled DM-2:  -A1c noted to be 5.9%.  CBG stable, labs reviewed -Discontinued CBG monitoring  RLE wound: POA-no signs of infection.  -lateral 12cm x 1.5cm x 0.2cm; 90% black soft eschar -Medial 4cm x 2.0cm x 0.2cm; 50% black soft eschar -Appreciate wound care input-cnzymatic debridement ointment to the LE wounds, wet-to-dry dressing  Stage III sacral wound: POA.  0.4cm x 0.2cm x 0.1cm; scabbed.  No drainage -Enzymatic debridement ointment to the sacrum  DVT prophylaxis: SCD's Code Status: DNR Family Communication: Pt in room, family not at bedside Disposition Plan: Uncertain at this time  Consultants:   [facility]  IR  Nephrology  Cardiology  Procedures:   HD on 4/16  IR thoracocentesis on 4/17  Chest tube placement on 4/20  Antimicrobials: Anti-infectives (From admission, onward)   Start     Dose/Rate Route Frequency Ordered Stop   06/18/18 0913  vancomycin (VANCOCIN) powder  Status:  Discontinued       As needed 06/18/18 0914 06/18/18 0958   06/18/18 0600  vancomycin (VANCOCIN) IVPB 1000 mg/200 mL premix     1,000 mg 200 mL/hr over 60 Minutes  Intravenous On call to O.R. 06/17/18 1443 06/18/18 0908   06/17/18 1200  ceFEPIme (MAXIPIME) 2 g in sodium chloride 0.9 % 100 mL IVPB     2 g 200 mL/hr over 30 Minutes Intravenous Every T-Th-Sa (Hemodialysis) 06/16/18 1001     06/16/18 1100  ceFEPIme (MAXIPIME) 2 g in sodium chloride 0.9 % 100 mL IVPB     2 g 200 mL/hr over 30 Minutes Intravenous  Once 06/16/18 1001 06/16/18 1900   06/15/18 1130  metroNIDAZOLE (FLAGYL) IVPB 500 mg  Status:  Discontinued     500 mg 100 mL/hr over 60 Minutes Intravenous Every 8 hours 06/15/18 1033 06/16/18 1001   06/15/18 1100  Ampicillin-Sulbactam (UNASYN) 3 g in sodium chloride 0.9 % 100 mL IVPB  Status:  Discontinued     3 g 200 mL/hr over 30 Minutes Intravenous Every 12 hours 06/15/18 1051 06/16/18 1001   06/14/18 1600  cefTRIAXone (ROCEPHIN) 2 g in sodium chloride 0.9 % 100 mL IVPB  Status:  Discontinued     2 g 200 mL/hr over 30 Minutes Intravenous Every 24 hours 06/14/18 1508 06/15/18 1033       Subjective: Without complaints  Objective: Vitals:   06/18/18 1130 06/18/18 1131 06/18/18 1145 06/18/18 1200  BP:  (!) 95/58    Pulse:      Resp: (!) 21 (!) 23 (!) 23   Temp:    Marland Kitchen)  97 F (36.1 C)  TempSrc:      SpO2:      Weight:      Height:        Intake/Output Summary (Last 24 hours) at 06/18/2018 1357 Last data filed at 06/18/2018 1200 Gross per 24 hour  Intake 1115 ml  Output 1478 ml  Net -363 ml   Filed Weights   06/17/18 0536 06/17/18 1810 06/18/18 0530  Weight: 51.8 kg 52.7 kg 52 kg    Examination:  General exam: Appears calm and comfortable  Respiratory system: Clear to auscultation. Respiratory effort normal. Cardiovascular system: S1 & S2 heard, RRR Gastrointestinal system: Abdomen is nondistended, soft and nontender. No organomegaly or masses felt. Normal bowel sounds heard. Central nervous system: Alert and oriented. No focal neurological deficits. Extremities: Symmetric 5 x 5 power. Skin: No rashes, lesions    Psychiatry: Judgement and insight appear normal. Mood & affect appropriate.   Data Reviewed: I have personally reviewed following labs and imaging studies  CBC: Recent Labs  Lab 06/14/18 0736 06/15/18 0655 06/16/18 0537 06/17/18 0452 06/18/18 0403  WBC 17.2* 19.6* 15.7* 11.4* 9.2  HGB 9.6* 10.7* 10.0* 10.7* 9.7*  HCT 30.6* 33.1* 32.8* 35.4* 30.8*  MCV 85.2 83.8 87.7 84.5 83.5  PLT 163 134* 139* 130* 536*   Basic Metabolic Panel: Recent Labs  Lab 06/14/18 0736 06/15/18 0406 06/16/18 0537 06/17/18 0452 06/17/18 2121 06/18/18 0403  NA 136 138 138  --  137 138  K 4.2 3.6 4.4  --  4.6 4.2  CL 95* 96* 96*  --  98 98  CO2 24 27 23   --  25 25  GLUCOSE 105* 95 120*  --  118* 102*  BUN 36* 19 26*  --  14 16  CREATININE 4.81* 3.00* 3.91*  --  2.53* 2.82*  CALCIUM 8.1* 7.9* 8.0*  --  7.9* 8.1*  MG 2.0 1.8 2.0 2.2  --  1.8  PHOS 4.1  --  4.1  --  2.5 2.9   GFR: Estimated Creatinine Clearance: 14.4 mL/min (A) (by C-G formula based on SCr of 2.82 mg/dL (H)). Liver Function Tests: Recent Labs  Lab 06/12/18 0431 06/13/18 2041 06/14/18 0736 06/16/18 0537 06/17/18 2121 06/18/18 0403  AST 42* 64*  --   --   --   --   ALT 23 31  --   --   --   --   ALKPHOS 74 73  --   --   --   --   BILITOT 0.9 0.7  --   --   --   --   PROT 5.8* 6.2*  --   --   --   --   ALBUMIN 1.8* 1.7* 1.5* 1.6* 1.5* 1.6*   No results for input(s): LIPASE, AMYLASE in the last 168 hours. No results for input(s): AMMONIA in the last 168 hours. Coagulation Profile: Recent Labs  Lab 06/15/18 0406 06/16/18 0537 06/16/18 1229 06/17/18 0452 06/18/18 0403  INR 3.7* 2.1* 1.9* 1.8* 1.6*   Cardiac Enzymes: No results for input(s): CKTOTAL, CKMB, CKMBINDEX, TROPONINI in the last 168 hours. BNP (last 3 results) No results for input(s): PROBNP in the last 8760 hours. HbA1C: No results for input(s): HGBA1C in the last 72 hours. CBG: Recent Labs  Lab 06/17/18 0742 06/17/18 1153 06/17/18 2120  06/18/18 0605 06/18/18 1009  GLUCAP 86 78 106* 93 99   Lipid Profile: No results for input(s): CHOL, HDL, LDLCALC, TRIG, CHOLHDL, LDLDIRECT in the last 72  hours. Thyroid Function Tests: No results for input(s): TSH, T4TOTAL, FREET4, T3FREE, THYROIDAB in the last 72 hours. Anemia Panel: No results for input(s): VITAMINB12, FOLATE, FERRITIN, TIBC, IRON, RETICCTPCT in the last 72 hours. Sepsis Labs: No results for input(s): PROCALCITON, LATICACIDVEN in the last 168 hours.  Recent Results (from the past 240 hour(s))  MRSA PCR Screening     Status: None   Collection Time: 06/11/18  6:23 AM  Result Value Ref Range Status   MRSA by PCR NEGATIVE NEGATIVE Final    Comment:        The GeneXpert MRSA Assay (FDA approved for NASAL specimens only), is one component of a comprehensive MRSA colonization surveillance program. It is not intended to diagnose MRSA infection nor to guide or monitor treatment for MRSA infections. Performed at [facility], [place] [address], [place], [place] [address]   Gram stain     Status: None   Collection Time: 06/13/18  5:04 PM  Result Value Ref Range Status   Specimen Description FLUID RIGHT PLEURAL  Final   Special Requests NONE  Final   Gram Stain   Final    ABUNDANT WBC PRESENT,BOTH PMN AND MONONUCLEAR NO ORGANISMS SEEN Performed at [facility], [address] [place], [place] [address]    Report Status 06/13/2018 FINAL  Final  Acid Fast Smear (AFB)     Status: None   Collection Time: 06/13/18  5:04 PM  Result Value Ref Range Status   AFB Specimen Processing Concentration  Final   Acid Fast Smear Negative  Final    Comment: (NOTE) Performed At: [facility] [place], [place] [id number] Rush Farmer MD FM:[id number]    Source (AFB) FLUID  Final    Comment: PLEURAL RIGHT Performed at [facility], [place] [address], [place], [place] [address]   Culture, body fluid-bottle     Status: Abnormal    Collection Time: 06/13/18  5:04 PM  Result Value Ref Range Status   Specimen Description FLUID RIGHT PLEURAL  Final   Special Requests BOTTLES DRAWN AEROBIC AND ANAEROBIC  Final   Gram Stain   Final    GRAM NEGATIVE RODS AEROBIC BOTTLE ONLY CRITICAL RESULT CALLED TO, READ BACK BY AND VERIFIED WITH: E. MONGE, RN CONCERNING GRAM STAIN AT 9390 ON 06/14/18 BY C. JESSUP, MLT Performed at [facility], [place] [address], [place], [place] [address]    Culture PSEUDOMONAS AERUGINOSA (A)  Final   Report Status 06/17/2018 FINAL  Final   Organism ID, Bacteria PSEUDOMONAS AERUGINOSA  Final      Susceptibility   Pseudomonas aeruginosa - MIC*    CEFTAZIDIME 4 SENSITIVE Sensitive     CIPROFLOXACIN <=0.25 SENSITIVE Sensitive     GENTAMICIN <=1 SENSITIVE Sensitive     IMIPENEM 2 SENSITIVE Sensitive     PIP/TAZO 16 SENSITIVE Sensitive     CEFEPIME <=1 SENSITIVE Sensitive     * PSEUDOMONAS AERUGINOSA  Surgical pcr screen     Status: None   Collection Time: 06/17/18  9:47 PM  Result Value Ref Range Status   MRSA, PCR NEGATIVE NEGATIVE Final   Staphylococcus aureus NEGATIVE NEGATIVE Final    Comment: (NOTE) The Xpert SA Assay (FDA approved for NASAL specimens in patients 97 years of age and older), is one component of a comprehensive surveillance program. It is not intended to diagnose infection nor to guide or monitor treatment. Performed at [facility], [place] [address]  [place],  [address]      Radiology Studies: Dg Chest Port 1 View  Result Date: 06/18/2018 CLINICAL DATA:  Follow-up central line placement EXAM: PORTABLE CHEST 1 VIEW COMPARISON:  06/17/2018 FINDINGS: Right jugular central line is noted at the cavoatrial junction. No pneumothorax is seen. Previously noted pigtail catheter has been removed and a new large bore chest tube has been placed on the right. Persistent pleural thickening is noted laterally as well as parenchymal density in the base somewhat improved from  the prior study. Small left-sided pleural effusion remains. Chronic changes about the proximal left humerus are noted. IMPRESSION: No pneumothorax following central line placement. New large bore chest tube is noted on the right with improved aeration in the right base. Electronically Signed   By: Inez Catalina M.D.   On: 06/18/2018 10:44   Dg Chest Port 1 View  Result Date: 06/17/2018 CLINICAL DATA:  Chest tube in place EXAM: PORTABLE CHEST 1 VIEW COMPARISON:  06/13/2018 FINDINGS: A chest tube now projects over the right lower hemithorax. The right effusion has improved. Overall volume has diminished. There is now some air within the pleural fluid consistent with hydropneumothorax. There is no apical right pneumothorax. Small left effusion and basilar atelectasis. Normal heart size. IMPRESSION: Right chest tube placed with improvement in the right pleural effusion. Electronically Signed   By: Marybelle Killings M.D.   On: 06/17/2018 08:11    Scheduled Meds:  amiodarone  200 mg Oral BID   atorvastatin  20 mg Oral QHS   bisacodyl  10 mg Oral Daily   collagenase   Topical Daily   feeding supplement (NEPRO CARB STEADY)  237 mL Oral BID BM   Gerhardt's butt cream   Topical QID   insulin aspart  0-24 Units Subcutaneous Q6H   metoprolol tartrate  25 mg Oral BID   multivitamin  1 tablet Oral QHS   ondansetron       potassium chloride  20 mEq Oral BID   senna-docusate  1 tablet Oral QHS   Continuous Infusions:  sodium chloride 50 mL/hr at 06/18/18 1230   ceFEPime (MAXIPIME) IV 2 g (06/17/18 1730)   potassium chloride       LOS: 6 days   Marylu Lund, MD [facility] Pager On Amion  If 7PM-7AM, please contact night-coverage 06/18/2018, 1:57 PM

## 2018-06-18 NOTE — Transfer of Care (Signed)
Immediate Anesthesia Transfer of Care Note  Patient: Vanessa Webb  Procedure(s) Performed: VIDEO ASSISTED THORACOSCOPY (VATS)/ DRAINAGE OF EMPYEMA (Right Chest)  Patient Location: PACU  Anesthesia Type:General  Level of Consciousness: oriented, drowsy, patient cooperative and responds to stimulation  Airway & Oxygen Therapy: Patient Spontanous Breathing and Patient connected to face mask oxygen  Post-op Assessment: Report given to RN and Post -op Vital signs reviewed and stable  Post vital signs: Reviewed and stable  Last Vitals:  Vitals Value Taken Time  BP 113/65 06/18/2018 10:02 AM  Temp    Pulse 103   Resp 17 06/18/2018 10:03 AM  SpO2 100   Vitals shown include unvalidated device data.  Last Pain:  Vitals:   06/18/18 0530  TempSrc: Oral  PainSc:      Left radial arterial line removed, distal fingers (pointer, middle) warm, pink with capillary refill less than 3 seconds, able to transduce SpO2 waveform, no complaints of pain per patient, removal site WNL.  Patients Stated Pain Goal: 0 (39/53/20 2334)  Complications: No apparent anesthesia complications

## 2018-06-18 NOTE — Anesthesia Preprocedure Evaluation (Signed)
Anesthesia Evaluation  Patient identified by MRN, date of birth, ID band Patient awake    Reviewed: Allergy & Precautions, NPO status , Patient's Chart, lab work & pertinent test results  History of Anesthesia Complications Negative for: history of anesthetic complications  Airway Mallampati: IV  TM Distance: <3 FB Neck ROM: Full    Dental  (+) Teeth Intact   Pulmonary pneumonia,  Right empyema   breath sounds clear to auscultation + decreased breath sounds      Cardiovascular hypertension, (-) angina+ Past MI and +CHF  + dysrhythmias Atrial Fibrillation  Rhythm:Irregular     Neuro/Psych PSYCHIATRIC DISORDERS Depression  Neuromuscular disease    GI/Hepatic Neg liver ROS, GERD  ,  Endo/Other  diabetes, Type 2  Renal/GU ESRF and DialysisRenal disease     Musculoskeletal   Abdominal   Peds  Hematology  (+) anemia ,   Anesthesia Other Findings   Reproductive/Obstetrics                             Anesthesia Physical Anesthesia Plan  ASA: III  Anesthesia Plan: General   Post-op Pain Management:    Induction: Intravenous and Rapid sequence  PONV Risk Score and Plan: 3 and Ondansetron and Dexamethasone  Airway Management Planned: Oral ETT  Additional Equipment: Arterial line, CVP and Ultrasound Guidance Line Placement  Intra-op Plan:   Post-operative Plan: Extubation in OR and Possible Post-op intubation/ventilation  Informed Consent: I have reviewed the patients History and Physical, chart, labs and discussed the procedure including the risks, benefits and alternatives for the proposed anesthesia with the patient or authorized representative who has indicated his/her understanding and acceptance.     Dental advisory given  Plan Discussed with: CRNA and Surgeon  Anesthesia Plan Comments:         Anesthesia Quick Evaluation

## 2018-06-18 NOTE — Brief Op Note (Addendum)
06/18/2018  9:38 AM  PATIENT:  Vanessa Webb  73 y.o. female  PRE-OPERATIVE DIAGNOSIS:  RIGHT EMPYEMA  POST-OPERATIVE DIAGNOSIS:  1. RIGHT EMPYEMA 2. TRAPPED RIGHT LUNG  PROCEDURE:  RIGHT VIDEO ASSISTED THORACOSCOPY (VATS),DRAINAGE OF RIGHT EMPYEMA   SURGEON:  Surgeon(s) and Role:    Rexene Alberts, MD - Primary  PHYSICIAN ASSISTANT: Lars Pinks Pa-C  ANESTHESIA:   general  EBL:  20 mL ; 850 cc of right pleural fluid   BLOOD ADMINISTERED:none  DRAINS: Blake drain placed in the right pleural space   SPECIMEN:  Source of Specimen:  Right pleural peel  DISPOSITION OF SPECIMEN:  PATHOLOGY  COUNTS CORRECT:  YES  DICTATION: .Dragon Dictation  PLAN OF CARE: Admit to inpatient   PATIENT DISPOSITION:  PACU - hemodynamically stable.   Delay start of Pharmacological VTE agent (>24hrs) due to surgical blood loss or risk of bleeding: yes  Rexene Alberts, MD 06/18/2018 9:43 AM

## 2018-06-19 ENCOUNTER — Inpatient Hospital Stay (HOSPITAL_COMMUNITY): Payer: Medicare Other

## 2018-06-19 ENCOUNTER — Encounter (HOSPITAL_COMMUNITY): Payer: Self-pay | Admitting: Thoracic Surgery (Cardiothoracic Vascular Surgery)

## 2018-06-19 DIAGNOSIS — Z978 Presence of other specified devices: Secondary | ICD-10-CM

## 2018-06-19 DIAGNOSIS — E1122 Type 2 diabetes mellitus with diabetic chronic kidney disease: Secondary | ICD-10-CM

## 2018-06-19 DIAGNOSIS — Z992 Dependence on renal dialysis: Secondary | ICD-10-CM

## 2018-06-19 DIAGNOSIS — D72829 Elevated white blood cell count, unspecified: Secondary | ICD-10-CM

## 2018-06-19 DIAGNOSIS — Z881 Allergy status to other antibiotic agents status: Secondary | ICD-10-CM

## 2018-06-19 DIAGNOSIS — B965 Pseudomonas (aeruginosa) (mallei) (pseudomallei) as the cause of diseases classified elsewhere: Secondary | ICD-10-CM

## 2018-06-19 DIAGNOSIS — J869 Pyothorax without fistula: Secondary | ICD-10-CM

## 2018-06-19 LAB — BASIC METABOLIC PANEL
Anion gap: 12 (ref 5–15)
BUN: 25 mg/dL — ABNORMAL HIGH (ref 8–23)
CO2: 24 mmol/L (ref 22–32)
Calcium: 7.8 mg/dL — ABNORMAL LOW (ref 8.9–10.3)
Chloride: 99 mmol/L (ref 98–111)
Creatinine, Ser: 3.55 mg/dL — ABNORMAL HIGH (ref 0.44–1.00)
GFR calc Af Amer: 14 mL/min — ABNORMAL LOW (ref >60)
GFR calc non Af Amer: 12 mL/min — ABNORMAL LOW (ref >60)
Glucose, Bld: 137 mg/dL — ABNORMAL HIGH (ref 70–99)
Potassium: 4.9 mmol/L (ref 3.5–5.1)
Sodium: 135 mmol/L (ref 135–145)

## 2018-06-19 LAB — BLOOD GAS, ARTERIAL
Acid-Base Excess: 0.6 mmol/L (ref 0.0–2.0)
Bicarbonate: 24.4 mmol/L (ref 20.0–28.0)
Drawn by: 51133
O2 Content: 4 L/min
O2 Saturation: 98.8 %
Patient temperature: 98.6
pCO2 arterial: 37.7 mmHg (ref 32.0–48.0)
pH, Arterial: 7.427 (ref 7.350–7.450)
pO2, Arterial: 153 mmHg — ABNORMAL HIGH (ref 83.0–108.0)

## 2018-06-19 LAB — CBC
HCT: 29.9 % — ABNORMAL LOW (ref 36.0–46.0)
Hemoglobin: 9.1 g/dL — ABNORMAL LOW (ref 12.0–15.0)
MCH: 26.3 pg (ref 26.0–34.0)
MCHC: 30.4 g/dL (ref 30.0–36.0)
MCV: 86.4 fL (ref 80.0–100.0)
Platelets: 130 10*3/uL — ABNORMAL LOW (ref 150–400)
RBC: 3.46 MIL/uL — ABNORMAL LOW (ref 3.87–5.11)
RDW: 17.9 % — ABNORMAL HIGH (ref 11.5–15.5)
WBC: 11 10*3/uL — ABNORMAL HIGH (ref 4.0–10.5)
nRBC: 0 % (ref 0.0–0.2)

## 2018-06-19 LAB — GLUCOSE, CAPILLARY
Glucose-Capillary: 118 mg/dL — ABNORMAL HIGH (ref 70–99)
Glucose-Capillary: 125 mg/dL — ABNORMAL HIGH (ref 70–99)
Glucose-Capillary: 150 mg/dL — ABNORMAL HIGH (ref 70–99)
Glucose-Capillary: 90 mg/dL (ref 70–99)
Glucose-Capillary: 91 mg/dL (ref 70–99)

## 2018-06-19 LAB — MAGNESIUM: Magnesium: 1.9 mg/dL (ref 1.7–2.4)

## 2018-06-19 LAB — PROTIME-INR
INR: 1.6 — ABNORMAL HIGH (ref 0.8–1.2)
Prothrombin Time: 18.9 seconds — ABNORMAL HIGH (ref 11.4–15.2)

## 2018-06-19 MED ORDER — WARFARIN SODIUM 1 MG PO TABS
1.0000 mg | ORAL_TABLET | Freq: Once | ORAL | Status: AC
  Administered 2018-06-19: 1 mg via ORAL
  Filled 2018-06-19: qty 1

## 2018-06-19 MED ORDER — ENOXAPARIN SODIUM 30 MG/0.3ML ~~LOC~~ SOLN
30.0000 mg | SUBCUTANEOUS | Status: DC
  Administered 2018-06-19 – 2018-06-23 (×5): 30 mg via SUBCUTANEOUS
  Filled 2018-06-19 (×5): qty 0.3

## 2018-06-19 MED ORDER — WARFARIN - PHARMACIST DOSING INPATIENT
Freq: Every day | Status: DC
  Administered 2018-06-19: 1
  Administered 2018-06-20 – 2018-06-24 (×2)

## 2018-06-19 NOTE — Progress Notes (Signed)
Patient ID: Vanessa Webb, female   DOB: Aug 30, 1945, 73 y.o.   MRN: [id number] [facility] Progress Note   Assessment/ Plan:   1.  Atrial fibrillation with rapid ventricular response:  per cardiology; metoprolol and amio.  For future elective cardioversion per cardiology.    2.  End-stage renal disease: HD today per TTS schedule.  Normally at [facility].  Note dry weight appears to be 53 kg on outpatient records.  Follow for need for adjustment as she has been hypotensive on HD but also with acute illness and possible weight change.    3. Pleural effusion with empyema - s/p VATS.  With chest tube   4.  Hyponatremia: improved with HD/UF, advised to limit intake.   5. 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     5.  Secondary hyperparathyroidism: We will follow calcium/phosphorus levels. Stable   6.  Protein calorie malnutrition: Significant hypoalbuminemia noted most possibly arising from nonhealing leg ulcer/acute phase reactant.  Continue renal vitamin/ONS.  7.  Hallucinations: off of Lomotil and metronidazole   Subjective:   Patient feels ok this morning.  Spoke with RN.  chest tube is to suction.  Last HD on 4/21 with under 500 mL UF.  She was below her reported dry weight and UF was decreased with hypotension at that time.  Review of systems:  Denies shortness of breath or chest pain  Denies nausea or vomiting    Objective:   BP 109/63   Pulse (!) 120   Temp 98.5 F (36.9 C) (Oral)   Resp 19   Ht 5\' 4"  (1.626 m)   Wt 52.9 kg   SpO2 100%   BMI 20.02 kg/m   Physical Exam: Gen: adult female in bed in NAD CVS: S1S2; no rub Resp: chest tube in place right chest with decreased breath sounds; left lung clear; unlabored Abd: Soft, flat, nontender Ext: no lower extremity edema; right AVF  Labs: BMET Recent Labs  Lab 06/13/18 0401 06/14/18 0736 06/15/18 0406 06/16/18 0537 06/17/18 2121 06/18/18 0403  NA 136 136 138 138 137 138  K 4.5 4.2 3.6 4.4 4.6 4.2  CL 98  95* 96* 96* 98 98  CO2 25 24 27 23 25 25   GLUCOSE 109* 105* 95 120* 118* 102*  BUN 28* 36* 19 26* 14 16  CREATININE 3.97* 4.81* 3.00* 3.91* 2.53* 2.82*  CALCIUM 8.1* 8.1* 7.9* 8.0* 7.9* 8.1*  PHOS  --  4.1  --  4.1 2.5 2.9   CBC Recent Labs  Lab 06/15/18 0655 06/16/18 0537 06/17/18 0452 06/18/18 0403  WBC 19.6* 15.7* 11.4* 9.2  HGB 10.7* 10.0* 10.7* 9.7*  HCT 33.1* 32.8* 35.4* 30.8*  MCV 83.8 87.7 84.5 83.5  PLT 134* 139* 130* 112*   Medications:    . amiodarone  200 mg Oral BID  . atorvastatin  20 mg Oral QHS  . bisacodyl  10 mg Oral Daily  . collagenase   Topical Daily  . feeding supplement (NEPRO CARB STEADY)  237 mL Oral BID BM  . Gerhardt's butt cream   Topical QID  . insulin aspart  0-24 Units Subcutaneous Q6H  . metoprolol tartrate  25 mg Oral BID  . multivitamin  1 tablet Oral QHS  . senna-docusate  1 tablet Oral QHS    Claudia Desanctis 06/19/2018

## 2018-06-19 NOTE — Progress Notes (Addendum)
ANTICOAGULATION CONSULT NOTE - Initial Consult  Pharmacy Consult for warfarin Indication: atrial fibrillation  Allergies  Allergen Reactions  . Sulfa Antibiotics Nausea Only    Patient Measurements: Height: 5\' 4"  (162.6 cm) Weight: 116 lb 10 oz (52.9 kg) IBW/kg (Calculated) : 54.7  Vital Signs: Temp: 97.9 F (36.6 C) (04/23 0800) Temp Source: Axillary (04/23 0800) BP: 114/78 (04/23 0702) Pulse Rate: 108 (04/23 0702)  Labs: Recent Labs    06/17/18 0452 06/17/18 1925 06/17/18 2121 06/18/18 0403 06/19/18 0620  HGB 10.7*  --   --  9.7* 9.1*  HCT 35.4*  --   --  30.8* 29.9*  PLT 130*  --   --  112* 130*  APTT  --  33  --   --   --   LABPROT 20.4*  --   --  19.1* 18.9*  INR 1.8*  --   --  1.6* 1.6*  CREATININE  --   --  2.53* 2.82* 3.55*    Estimated Creatinine Clearance: 11.6 mL/min (A) (by C-G formula based on SCr of 3.55 mg/dL (H)).   Medical History: Past Medical History:  Diagnosis Date  . Anemia    takes Folic Acid;pt states she gets an injection every 2wks   . Chronic combined systolic and diastolic congestive heart failure ([place])   . Closed right hip fracture, initial encounter ([place]) 10/19/2017  . Depression    but doesn't take any meds for it  . Diabetes mellitus    takes Lantus and Novolog 70/30;fasting blood sugar 200;Type 2  . GERD (gastroesophageal reflux disease)    takes Omeprazole daily  . GI bleeding   . History of bladder infections    last one a month ago;saw Dr.Wrenn and cysto was done in office;was on antibiotics and completed 2wks ago  . History of blood transfusion    no blood transfusion  . HLD (hyperlipidemia) 10/19/2017  . HTN (hypertension) 10/19/2017  . Hyperlipidemia    takes Zocor daily  . Hypertension    takes Coreg daily  . Mitral regurgitation   . Myocardial infarction ([place]) 2010  . PAF (paroxysmal atrial fibrillation) ([place])   . Pancreatitis   . Peripheral neuropathy   . Pleural effusion, bilateral   . 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 UTI (lower urinary tract infection)     Assessment: 73 year old female s/p VATS 4/22. Patient with history of afib on warfarin prior to admission.    Lovenox for DVT px ordered, new orders to start warfarin tonight. INR is 1.6 this am. ABLA noted, hgb 9.1. Vitamin k given 4/20.   Patient on cefepime for pseudomonas in pleural fluid, it is being dosing with hemodialysis. No changes for now  Goal of Therapy:  INR 2-3 Monitor platelets by anticoagulation protocol: Yes   Plan:  Warfarin 1mg  tonight Continue daily INR Cefepime 2g after each HD session - TTS  Erin Hearing PharmD., BCPS Clinical Pharmacist 06/19/2018 9:02 AM

## 2018-06-19 NOTE — Anesthesia Postprocedure Evaluation (Signed)
Anesthesia Post Note  Patient: Vanessa Webb  Procedure(s) Performed: VIDEO ASSISTED THORACOSCOPY (VATS)/ DRAINAGE OF EMPYEMA (Right Chest)     Patient location during evaluation: PACU Anesthesia Type: General Level of consciousness: awake and alert Pain management: pain level controlled Vital Signs Assessment: post-procedure vital signs reviewed and stable Respiratory status: spontaneous breathing, nonlabored ventilation, respiratory function stable and patient connected to nasal cannula oxygen Cardiovascular status: blood pressure returned to baseline and stable Postop Assessment: no apparent nausea or vomiting Anesthetic complications: no    Last Vitals:  Vitals:   06/19/18 1530 06/19/18 1550  BP: (!) 99/46 (!) 101/56  Pulse: (!) 110 (!) 110  Resp: (!) 23 18  Temp:  (!) 36.3 C  SpO2:  98%    Last Pain:  Vitals:   06/19/18 1647  TempSrc: Other (Comment)  PainSc:                  [name]

## 2018-06-19 NOTE — Consult Note (Signed)
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   HTN (hypertension)   Hyperlipidemia   S/P right hip fracture   Pressure injury of skin   Mitral regurgitation   Chronic combined systolic and diastolic congestive heart failure (H[facility])   Pleural effusion, bilateral   Empyema of pleural space (HCC)   Left humeral fracture   Diarrhea   Trapped lung    amiodarone  200 mg Oral BID   atorvastatin  20 mg Oral QHS   bisacodyl  10 mg Oral Daily   collagenase   Topical Daily   enoxaparin (LOVENOX) injection  30 mg Subcutaneous Q24H   feeding supplement (NEPRO CARB STEADY)  237 mL Oral BID BM   Gerhardt's butt cream   Topical QID   insulin aspart  0-24 Units Subcutaneous Q6H   metoprolol tartrate  25 mg Oral BID   multivitamin  1 tablet Oral QHS   senna-docusate  1 tablet Oral QHS   warfarin  1 mg Oral ONCE-1800   Warfarin - Pharmacist Dosing Inpatient   Does not apply q1800    Recommendations: 1. Empyema -while Pseudomonas is an unusual community-acquired pathogen, this was isolated from her pleural fluid culture following her thoracentesis.  Patient denies receiving any antecedent antibiotics prior to her admission despite her prolonged generalized illness.  Interestingly, the patient did not report dyspnea or cough so much is pleuritic chest pain in the 3 weeks prior to her admission here.  She underwent successful VATS/decortication for empyema on April 22 and now has a right-sided chest tube that remains intact.  Will defer management of her chest tube to CT surgery.  We will de-escalate the patient's antibiotics to cefepime monotherapy, favoring a dosing regimen of 2 g IV daily for the  present time.  Given the patient's A. fib with RVR recently I would be cautious using a quinolone for outpatient treatment of her pathogen, although her isolate was pansensitive.  I do suspect that the patient developed this complication as a result of a community-acquired pneumonia for which she did not seek initial care.  Would anticipate a 4-week course of antibiotics in total from the time of her decortication with repeat chest imaging used to assist in determining final duration of treatment.  2. Leukocytosis -the patient's white blood cell count peaked around approximately 20,000 prior to her thoracentesis.  Since evacuation of her fluid collection and particularly since her VATS the patient's white blood cell count is now normalized along with empiric antibiotics.  Check this patient CBC with differential regularly now that she has her white blood cell count returning to normal limits.  3. ESRD - The patient normally dialyzes Tuesday, Thursday, Saturday as an outpatient.  While parenteral therapy would be preferred given that she has a gram-negative pathogen, she may require daily dosing of IV antibiotics and therefore would require a second IV line.  One alternative strategy might involve giving the patient parenteral cefepime while at dialysis to be supplemented with oral quinolone on her nondialysis days.  Will review final dosing strategies as the patient clinically improves and is nearing discharge.  We will discussed this case with our infectious disease pharmacy team as well.  Assessment: The  patient is a 73 y/o WF diabetic with ESRD on HD admitted to OSH with FTT in Afib with RVR found to have a RT sided pleural effusion transitioning to an empyema/trapped lung, s/p VATS with chest tube drainage.  Antibiotics: Cefepime, day 4 Vancomycin, day 2  HPI: Vanessa Webb is a 73 y.o. female diabetic with end-stage renal disease on hemodialysis admitted with failure to thrive and A. fib with RVR  who was subsequently found to have a right-sided pleural effusion/empyema.  The patient does describe and indolent history of malaise cough and dyspnea on exertion over the last 3 to 4 weeks.  Initially, she was concerned she may actually have coronavirus but was afraid to tell her family or those at her dialysis center for fear that she would be unable to continue to attend further dialysis sessions.  She denies any sick contacts and denies any fever but was beginning to develop pleuritic chest pain prior to her evaluation at [facility].  Upon admission on June 11, 2018, chest imaging showed a right-sided pleural effusion.  A thoracentesis was performed to her right side on June 13, 2018, and she was taken to the operating room on June 18, 2018 where she underwent a video-assisted thoracoscopy and drainage of empyema as pleural fluid analysis suggested an exudative process.  A right-sided chest tube was left intact following this procedure.  Pleural fluid cultures have now become positive for Pseudomonas.  She was empirically started on Rocephin Unasyn and Flagyl which was later transitioned to cefepime on April 19.  She is currently receiving hemodialysis during my evaluation which is limited as the patient cannot expose her posterior chest while she is being dialyzed. Imaging, ABXs, fever curves, WBC trends, and op-notes all independently reviewed.  Review of Systems: +malaise, FTT x 3 weeks PTA, +DOE and pleuritic CP x 1 week PTA, no F/C, no N/V/D, no HA, +anuric All other systems reviewed and are negative    Past Medical History:  Diagnosis Date   Anemia    takes Folic Acid;pt states she gets an injection every 2wks    Chronic combined systolic and diastolic congestive heart failure (HCC)    Closed right hip fracture, initial encounter ([place]) 10/19/2017   Depression    but doesn't take any meds for it   Diabetes mellitus    takes Lantus and Novolog 70/30;fasting blood sugar 200;Type 2     GERD (gastroesophageal reflux disease)    takes Omeprazole daily   GI bleeding    History of bladder infections    last one a month ago;saw Dr.Wrenn and cysto was done in office;was on antibiotics and completed 2wks ago   History of blood transfusion    no blood transfusion   HLD (hyperlipidemia) 10/19/2017   HTN (hypertension) 10/19/2017   Hyperlipidemia    takes Zocor daily   Hypertension    takes Coreg daily   Mitral regurgitation    Myocardial infarction ([place]) 2010   PAF (paroxysmal atrial fibrillation) (HCC)    Pancreatitis    Peripheral neuropathy    Pleural effusion, bilateral    Trapped lung 06/18/2018   right   UTI (lower urinary tract infection)     Social History   Tobacco Use   Smoking status: Never Smoker   Smokeless tobacco: Never Used  Substance Use Topics   Alcohol use: No   Drug use: No    Family History  Problem Relation Age of Onset   Diabetes Mother  Cancer Father    Diabetes Daughter    Heart attack Daughter    Pneumonia Daughter    Anesthesia problems Neg Hx    Hypotension Neg Hx    Malignant hyperthermia Neg Hx    Pseudochol deficiency Neg Hx     Allergies  Allergen Reactions   Sulfa Antibiotics Nausea Only    Physical Exam: Constitutional: chronically ill, small stature, A&O x 3, moderate distress secondary to pleuritic CP Vitals:   06/19/18 0800 06/19/18 1024  BP:  121/74  Pulse:  (!) 112  Resp:    Temp: 97.9 F (36.6 C)   SpO2:    EYES: anicteric, PERRL, EOMI ENMT: dry MM, OP clear, mild bitemporal wasting Neck: mild JVD, supple Cardiovascular: tachycardic rate, irregular rhythm, no murmurs appreciated Respiratory: decreased BS at RT base, RT sided CT intact and connected to LWS, no wheeze GI: soft, NTND, +BS Musculoskeletal: normal muscle tone, slight atrophy Extrems: no LE edema, RT arm AVF with good palpable thrill and audible bruit Skin: poor skin turgor, no rashes Neuro: CN II-XII  grossly intact, no focal neurologic deficits, gait was not assessed as pt evaluated on HD Lines: RT IJ TLC, RT chest tube  Lab Results  Component Value Date   WBC 11.0 (H) 06/19/2018   HGB 9.1 (L) 06/19/2018   HCT 29.9 (L) 06/19/2018   MCV 86.4 06/19/2018   PLT 130 (L) 06/19/2018    Lab Results  Component Value Date   CREATININE 3.55 (H) 06/19/2018   BUN 25 (H) 06/19/2018   NA 135 06/19/2018   K 4.9 06/19/2018   CL 99 06/19/2018   CO2 24 06/19/2018    Lab Results  Component Value Date   ALT 31 06/13/2018   AST 64 (H) 06/13/2018   ALKPHOS 73 06/13/2018     Microbiology: Recent Results (from the past 240 hour(s))  MRSA PCR Screening     Status: None   Collection Time: 06/11/18  6:23 AM  Result Value Ref Range Status   MRSA by PCR NEGATIVE NEGATIVE Final    Comment:        The GeneXpert MRSA Assay (FDA approved for NASAL specimens only), is one component of a comprehensive MRSA colonization surveillance program. It is not intended to diagnose MRSA infection nor to guide or monitor treatment for MRSA infections. Performed at [facility], [place] [address], [place], [place] [address]   Gram stain     Status: None   Collection Time: 06/13/18  5:04 PM  Result Value Ref Range Status   Specimen Description FLUID RIGHT PLEURAL  Final   Special Requests NONE  Final   Gram Stain   Final    ABUNDANT WBC PRESENT,BOTH PMN AND MONONUCLEAR NO ORGANISMS SEEN Performed at [facility], [address] [place], [place] [address]    Report Status 06/13/2018 FINAL  Final  Acid Fast Smear (AFB)     Status: None   Collection Time: 06/13/18  5:04 PM  Result Value Ref Range Status   AFB Specimen Processing Concentration  Final   Acid Fast Smear Negative  Final    Comment: (NOTE) Performed At: [facility] [place], [place] [id number] Rush Farmer MD TD:[id number]    Source (AFB) FLUID  Final    Comment: PLEURAL RIGHT Performed at [facility], [place] [address] [place], [place] [address]   Culture, body fluid-bottle     Status: Abnormal   Collection Time: 06/13/18  5:04 PM  Result Value Ref Range Status   Specimen Description FLUID RIGHT PLEURAL  Final   Special Requests BOTTLES DRAWN AEROBIC AND ANAEROBIC  Final   Gram Stain   Final    GRAM NEGATIVE RODS AEROBIC BOTTLE ONLY CRITICAL RESULT CALLED TO, READ BACK BY AND VERIFIED WITH: E. MONGE, RN CONCERNING GRAM STAIN AT 9842 ON 06/14/18 BY C. JESSUP, MLT Performed at [facility], [place] [address] [place], [place] [address]    Culture PSEUDOMONAS AERUGINOSA (A)  Final   Report Status 06/17/2018 FINAL  Final   Organism ID, Bacteria PSEUDOMONAS AERUGINOSA  Final      Susceptibility   Pseudomonas aeruginosa - MIC*    CEFTAZIDIME 4 SENSITIVE Sensitive     CIPROFLOXACIN <=0.25 SENSITIVE Sensitive     GENTAMICIN <=1 SENSITIVE Sensitive     IMIPENEM 2 SENSITIVE Sensitive     PIP/TAZO 16 SENSITIVE Sensitive     CEFEPIME <=1 SENSITIVE Sensitive     * PSEUDOMONAS AERUGINOSA  Surgical pcr screen     Status: None   Collection Time: 06/17/18  9:47 PM  Result Value Ref Range Status   MRSA, PCR NEGATIVE NEGATIVE Final   Staphylococcus aureus NEGATIVE NEGATIVE Final    Comment: (NOTE) The Xpert SA Assay (FDA approved for NASAL specimens in patients 29 years of age and older), is one component of a comprehensive surveillance program. It is not intended to diagnose infection nor to guide or monitor treatment. Performed at [facility], [place] [address], [place], [place] [address]     60 minutes of critical care time spent  Vanessa Webb, [place] [facility] Infectious Disease [place] [email or web address] 06/19/2018, 11:53 AM

## 2018-06-19 NOTE — Progress Notes (Signed)
PROGRESS NOTE    Vanessa Webb  DGL:[id number] DOB: 05/26/1945 DOA: 06/11/2018 PCP: Monico Blitz, MD   Brief Narrative: 73 year old female with history of ESRD on HD TTS, hypertension, hyperlipidemia, A. fib on Coumadin for anticoagulation and chronic right leg ulcer transferred from [facility] for hemodialysis. Patient presented to [facility] with 2 weeks of generalized weakness and found to be in A. fib with RVR. She was placed on diltiazem drip transition to amiodarone prior to transfer. Patient is dialysis on 4/14 due to weakness. Reports good compliance with her metoprolol.  Of note, patient has had poor appetite, generalized weakness and about 40 pound weight loss over the last 1 year. She fell and broke her right hip 6 months ago and underwent IM nailing. She also fell and broke her left shoulder 3 months ago on the way to dialysis that has been treated conservatively.  Patient remained in A. fib with RVR on amiodarone drip. Initially, plan for DCCV if heart rate is not under good control. This has changed after talking to CTS about the pleural effusion and noted to have Large right-sided pleural effusion  on chest x-ray and CT chest. These appear to be chronic. She had US-guided diagnostic and therapeutic right thoracocentesis on 4/17 that yielded 300 cc exudative fluid by LDH. Pleural fluid Gram stain and culture with GNR. AFB smear negative. AFB culture and cytology pending. Started on Unasyn and Flagyl. Cardiothoracic surgery consulted on 4/19 recommended stopping warfarin, INR reversal and chest tube placement by IR. Pleural fluid culture with Pseudomonas aeruginosa and transitioned to cefepime on 06/16/2018. 4/20: IR chest tube placement and Gram stain negative, cytology consistent with acute inflammation. 4/22: VATS.  Subjective: Resting well on room air.  No new complaints.  Denies chest pain nausea vomiting fevers chills.  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 pending or culture from VATS.  Chest tube remains in place, discussed with CT surgery this morning.  Continue on current cefepime, will ask ID consult for recommendation for long-term antibiotics/follow-up.  AFB is neg from pleural fluid.   Persistent atrial fibrillation : rate high normal.  Tsh nl, c/w Amiodarone, metoprolol per cardiology.  Resuming warfarin. DCCV after INR therapeutic for 3 consecutive weeks.  Appreciate cardiology inputs.  ESRD  On HD TTS w anemia of chronic disease/mineral bone disease: nephrology on board for dialysis. Appreciate inputs.  Hyponatremia improved with dialysis.  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 Recent Labs  Lab 06/15/18 0655 06/16/18 0537 06/17/18 0452 06/18/18 0403 06/19/18 0620  WBC 19.6* 15.7* 11.4* 9.2 11.0*   Hallucination off of Lamictal and Flagyl.  Type 2 DM, hba1c 5.9 4/17, well controlled, monitor CBG  RLE wound followed by wound care outpatient, POA, lateral 12cm x 1.5cm x 0.2cm; 90% black soft eschar-Medial 4cm x 2.0cm x 0.2cm; 50% black soft eschar. C/w wound care here.  Stage  II Sacral decubitus0.4cm x 0.2cm x 0.1cm; POA.  Scabbed without drainage.  Cont wound care  HTN: Blood pressure controlled, continue metoprolol  Hyperlipidemia on lipitor  Pulmonary nodule/mild right paratracheal lymphadenopathy 8 mm LLL pulmonary nodule seen on CT chest 8 follow-up in 3 to 6 months with CT chest.  Pleural fluid is negative for malignancy.  Recurrent fal/hx of right hip fractures, comminuted left proximal humerus fracture: status post IM nail of hip 6 months ago, left shoulder managed  conservatively, continue pain control, PT OT  DVT prophylaxis: SCD/ warfarin Code Status: DNR Family Communication: Discussed plan of care with the patient in detail AND she verbalized understanding Disposition Plan: remains inpatient pending clinical improvement.   Consultants:   [facility]  IR  Nephrology  Cardiology   Procedures:  HD on 4/16  IR thoracocentesis on 4/17  Chest tube placement on 4/20  VATS 4/22  Antimicrobials: Anti-infectives (From admission, onward)   Start     Dose/Rate Route Frequency Ordered Stop   06/18/18 0913  vancomycin (VANCOCIN) powder  Status:  Discontinued       As needed 06/18/18 0914 06/18/18 0958   06/18/18 0600  vancomycin (VANCOCIN) IVPB 1000 mg/200 mL premix     1,000 mg 200 mL/hr over 60 Minutes Intravenous On call to O.R. 06/17/18 1443 06/18/18 0908   06/17/18 1200  ceFEPIme (MAXIPIME) 2 g in sodium chloride 0.9 % 100 mL IVPB     2 g 200 mL/hr over 30 Minutes Intravenous Every T-Th-Sa (Hemodialysis) 06/16/18 1001     06/16/18 1100  ceFEPIme (MAXIPIME) 2 g in sodium chloride 0.9 % 100 mL IVPB     2 g 200 mL/hr over 30 Minutes Intravenous  Once 06/16/18 1001 06/16/18 1900   06/15/18 1130  metroNIDAZOLE (FLAGYL) IVPB 500 mg  Status:  Discontinued     500 mg 100 mL/hr over 60 Minutes Intravenous Every 8 hours 06/15/18 1033 06/16/18 1001   06/15/18 1100  Ampicillin-Sulbactam (UNASYN) 3 g in sodium chloride 0.9 % 100 mL IVPB  Status:  Discontinued     3 g 200 mL/hr over 30 Minutes Intravenous Every 12 hours 06/15/18 1051 06/16/18 1001   06/14/18 1600  cefTRIAXone (ROCEPHIN) 2 g in sodium chloride 0.9 % 100 mL IVPB  Status:  Discontinued     2 g 200 mL/hr over 30 Minutes Intravenous Every 24 hours 06/14/18 1508 06/15/18 1033       Objective: Vitals:   06/19/18 0700 06/19/18 0702 06/19/18 0800 06/19/18 1024  BP: 114/78 114/78  121/74  Pulse:  (!) 108  (!) 112  Resp: 16 16    Temp:  (!) 97.5 F (36.4 C) 97.9 F (36.6 C)     TempSrc:  Axillary Axillary   SpO2:  96%    Weight:      Height:        Intake/Output Summary (Last 24 hours) at 06/19/2018 1119 Last data filed at 06/19/2018 0800 Gross per 24 hour  Intake 335 ml  Output 740 ml  Net -405 ml   Filed Weights   06/17/18 1810 06/18/18 0530 06/19/18 0332  Weight: 52.7 kg 52 kg 52.9 kg   Weight change: 0.2 kg  Body mass index is 20.02 kg/m.  Intake/Output from previous day: 04/22 0701 - 04/23 0700 In: 915 [I.V.:900] Out: 1470 [Blood:20; Chest Tube:600] Intake/Output this shift: Total I/O In: 320 [P.O.:320] Out: 140 [Chest Tube:140]  Examination:  General exam: Appears calm and comfortable,Not in distress, older fore the age HEENT:PERRL,Oral mucosa moist, Ear/Nose normal on gross exam Respiratory system: Diminished breath sounds on right, with chest tube in place airway clear on the left side.   Cardiovascular system: S1 & S2 heard,No JVD, murmurs. Gastrointestinal system: Abdomen is  soft, non tender, non distended, BS +  Nervous System:Alert and oriented.  No focal neurological deficits/moving extremities, sensation intact. Extremities: No edema, no clubbing, distal peripheral pulses palpable. Skin: No rashes, lesions, no icterus MSK: Normal muscle bulk,tone ,power  Medications:  Scheduled Meds:  amiodarone  200 mg Oral BID   atorvastatin  20 mg Oral QHS   bisacodyl  10 mg Oral Daily   collagenase   Topical Daily   enoxaparin (LOVENOX) injection  30 mg Subcutaneous Q24H   feeding supplement (NEPRO CARB STEADY)  237 mL Oral BID BM   Gerhardt's butt cream   Topical QID   insulin aspart  0-24 Units Subcutaneous Q6H   metoprolol tartrate  25 mg Oral BID   multivitamin  1 tablet Oral QHS   senna-docusate  1 tablet Oral QHS   warfarin  1 mg Oral ONCE-1800   Warfarin - Pharmacist Dosing Inpatient   Does not apply q1800   Continuous Infusions:  ceFEPime (MAXIPIME) IV 2 g (06/17/18 1730)   potassium chloride      Data  Reviewed: I have personally reviewed following labs and imaging studies  CBC: Recent Labs  Lab 06/15/18 0655 06/16/18 0537 06/17/18 0452 06/18/18 0403 06/19/18 0620  WBC 19.6* 15.7* 11.4* 9.2 11.0*  HGB 10.7* 10.0* 10.7* 9.7* 9.1*  HCT 33.1* 32.8* 35.4* 30.8* 29.9*  MCV 83.8 87.7 84.5 83.5 86.4  PLT 134* 139* 130* 112* 315*   Basic Metabolic Panel: Recent Labs  Lab 06/14/18 0736 06/15/18 0406 06/16/18 0537 06/17/18 0452 06/17/18 2121 06/18/18 0403 06/19/18 0620  NA 136 138 138  --  137 138 135  K 4.2 3.6 4.4  --  4.6 4.2 4.9  CL 95* 96* 96*  --  98 98 99  CO2 24 27 23   --  25 25 24   GLUCOSE 105* 95 120*  --  118* 102* 137*  BUN 36* 19 26*  --  14 16 25*  CREATININE 4.81* 3.00* 3.91*  --  2.53* 2.82* 3.55*  CALCIUM 8.1* 7.9* 8.0*  --  7.9* 8.1* 7.8*  MG 2.0 1.8 2.0 2.2  --  1.8 1.9  PHOS 4.1  --  4.1  --  2.5 2.9  --    GFR: Estimated Creatinine Clearance: 11.6 mL/min (A) (by C-G formula based on SCr of 3.55 mg/dL (H)). Liver Function Tests: Recent Labs  Lab 06/13/18 2041 06/14/18 0736 06/16/18 0537 06/17/18 2121 06/18/18 0403  AST 64*  --   --   --   --   ALT 31  --   --   --   --   ALKPHOS 73  --   --   --   --   BILITOT 0.7  --   --   --   --   PROT 6.2*  --   --   --   --   ALBUMIN 1.7* 1.5* 1.6* 1.5* 1.6*   No results for input(s): LIPASE, AMYLASE in the last 168 hours. No results for input(s): AMMONIA in the last 168 hours. Coagulation Profile: Recent Labs  Lab 06/16/18 0537 06/16/18 1229 06/17/18 0452 06/18/18 0403 06/19/18 0620  INR 2.1* 1.9* 1.8* 1.6* 1.6*   Cardiac Enzymes: No results for input(s): CKTOTAL, CKMB, CKMBINDEX, TROPONINI in the last 168 hours. BNP (last 3 results) No results for input(s): PROBNP in the last 8760 hours. HbA1C: No results for input(s): HGBA1C in the last 72 hours. CBG: Recent Labs  Lab 06/18/18 1009 06/18/18 1636 06/18/18 2357 06/19/18 0330 06/19/18 0612  GLUCAP 99 111* 150* 118* 125*  Lipid  Profile: No results for input(s): CHOL, HDL, LDLCALC, TRIG, CHOLHDL, LDLDIRECT in the last 72 hours. Thyroid Function Tests: No results for input(s): TSH, T4TOTAL, FREET4, T3FREE, THYROIDAB in the last 72 hours. Anemia Panel: No results for input(s): VITAMINB12, FOLATE, FERRITIN, TIBC, IRON, RETICCTPCT in the last 72 hours. Sepsis Labs: No results for input(s): PROCALCITON, LATICACIDVEN in the last 168 hours.  Recent Results (from the past 240 hour(s))  MRSA PCR Screening     Status: None   Collection Time: 06/11/18  6:23 AM  Result Value Ref Range Status   MRSA by PCR NEGATIVE NEGATIVE Final    Comment:        The GeneXpert MRSA Assay (FDA approved for NASAL specimens only), is one component of a comprehensive MRSA colonization surveillance program. It is not intended to diagnose MRSA infection nor to guide or monitor treatment for MRSA infections. Performed at [facility], [place] [address], [place], [place] [address]   Gram stain     Status: None   Collection Time: 06/13/18  5:04 PM  Result Value Ref Range Status   Specimen Description FLUID RIGHT PLEURAL  Final   Special Requests NONE  Final   Gram Stain   Final    ABUNDANT WBC PRESENT,BOTH PMN AND MONONUCLEAR NO ORGANISMS SEEN Performed at [facility], [address], [place], [place] [address]    Report Status 06/13/2018 FINAL  Final  Acid Fast Smear (AFB)     Status: None   Collection Time: 06/13/18  5:04 PM  Result Value Ref Range Status   AFB Specimen Processing Concentration  Final   Acid Fast Smear Negative  Final    Comment: (NOTE) Performed At: [facility] [place], [place] [id number] Rush Farmer MD SE:[id number]    Source (AFB) FLUID  Final    Comment: PLEURAL RIGHT Performed at [facility], [place] [address], [place], [place] [address]   Culture, body fluid-bottle     Status: Abnormal   Collection Time: 06/13/18  5:04 PM  Result Value Ref Range Status    Specimen Description FLUID RIGHT PLEURAL  Final   Special Requests BOTTLES DRAWN AEROBIC AND ANAEROBIC  Final   Gram Stain   Final    GRAM NEGATIVE RODS AEROBIC BOTTLE ONLY CRITICAL RESULT CALLED TO, READ BACK BY AND VERIFIED WITH: E. MONGE, RN CONCERNING GRAM STAIN AT 6269 ON 06/14/18 BY C. JESSUP, MLT Performed at [facility], [place] [address] [place], [place] [address]    Culture PSEUDOMONAS AERUGINOSA (A)  Final   Report Status 06/17/2018 FINAL  Final   Organism ID, Bacteria PSEUDOMONAS AERUGINOSA  Final      Susceptibility   Pseudomonas aeruginosa - MIC*    CEFTAZIDIME 4 SENSITIVE Sensitive     CIPROFLOXACIN <=0.25 SENSITIVE Sensitive     GENTAMICIN <=1 SENSITIVE Sensitive     IMIPENEM 2 SENSITIVE Sensitive     PIP/TAZO 16 SENSITIVE Sensitive     CEFEPIME <=1 SENSITIVE Sensitive     * PSEUDOMONAS AERUGINOSA  Surgical pcr screen     Status: None   Collection Time: 06/17/18  9:47 PM  Result Value Ref Range Status   MRSA, PCR NEGATIVE NEGATIVE Final   Staphylococcus aureus NEGATIVE NEGATIVE Final    Comment: (NOTE) The Xpert SA Assay (FDA approved for NASAL specimens in patients 64 years of age and older), is one component of a comprehensive surveillance program. It is not intended to diagnose  infection nor to guide or monitor treatment. Performed at [facility], [place] [address] [place], [place] [address]       Radiology Studies: Dg Chest [place] 1 View  Result Date: 06/19/2018 CLINICAL DATA:  73 year old female with a right-sided pneumothorax EXAM: PORTABLE CHEST 1 VIEW COMPARISON:  Prior chest x-ray 06/18/2018 FINDINGS: Right-sided thoracostomy tube remains in good position. Right IJ central venous catheter with the tip at the superior cavoatrial junction no residual right-sided pneumothorax. There is persistent pleural thickening versus pleural effusion. Stable borderline cardiomegaly. Mediastinal contours remain unchanged. Atherosclerotic calcifications again  noted in the transverse aorta. Small left pleural effusion with associated left basilar atelectasis. Overall, slightly improved aeration of the right lung. Background bronchitic changes are stable. IMPRESSION: 1. Slightly improved aeration and volumes within the right lung. 2. Persistent patchy airspace opacity in the right lower lobe likely reflects underlying pneumonia or residual atelectasis. 3. Well-positioned right-sided thoracostomy tube without evidence of pneumothorax. 4. Persistent mild right pleural thickening versus residual pleural effusion. 5. Small layering left pleural effusion and associated left basilar atelectasis. 6. The tip of the right IJ central venous catheter overlies the cavoatrial junction in good position. Electronically Signed   By: Jacqulynn Cadet M.D.   On: 06/19/2018 07:47   Dg Chest [place] 1 View  Result Date: 06/18/2018 CLINICAL DATA:  Follow-up central line placement EXAM: PORTABLE CHEST 1 VIEW COMPARISON:  06/17/2018 FINDINGS: Right jugular central line is noted at the cavoatrial junction. No pneumothorax is seen. Previously noted pigtail catheter has been removed and a new large bore chest tube has been placed on the right. Persistent pleural thickening is noted laterally as well as parenchymal density in the base somewhat improved from the prior study. Small left-sided pleural effusion remains. Chronic changes about the proximal left humerus are noted. IMPRESSION: No pneumothorax following central line placement. New large bore chest tube is noted on the right with improved aeration in the right base. Electronically Signed   By: Inez Catalina M.D.   On: 06/18/2018 10:44      LOS: 7 days   Time spent: More than 50% of that time was spent in counseling and/or coordination of care.  Antonieta Pert, MD [facility]  06/19/2018, 11:19 AM

## 2018-06-19 NOTE — Progress Notes (Addendum)
[address]       [place] [address]             [phone]      1 Day Post-Op Procedure(s) (LRB): VIDEO ASSISTED THORACOSCOPY (VATS)/ DRAINAGE OF EMPYEMA (Right) Subjective: Doing okay this morning. No pain. Has some memory issues.   Objective: Vital signs in last 24 hours: Temp:  [97 F (36.1 C)-98.5 F (36.9 C)] 97.5 F (36.4 C) (04/23 0702) Pulse Rate:  [102-120] 108 (04/23 0702) Cardiac Rhythm: Atrial fibrillation (04/23 0702) Resp:  [16-35] 16 (04/23 0702) BP: (81-114)/(47-78) 114/78 (04/23 0702) SpO2:  [96 %-100 %] 96 % (04/23 0702) Weight:  [52.9 kg] 52.9 kg (04/23 0332)     Intake/Output from previous day: 04/22 0701 - 04/23 0700 In: 915 [I.V.:900] Out: 1470 [Blood:20; Chest Tube:600] Intake/Output this shift: Total I/O In: 120 [P.O.:120] Out: 140 [Chest Tube:140]  General appearance: alert, cooperative and no distress Heart: irregularly irregular rhythm Lungs: clear to auscultation bilaterally and diffuse rhonchi in the upper lobes Abdomen: soft, non-tender; bowel sounds normal; no masses,  no organomegaly Extremities: extremities normal, atraumatic, no cyanosis or edema Wound: clean and dry  Lab Results: Recent Labs    06/18/18 0403 06/19/18 0620  WBC 9.2 11.0*  HGB 9.7* 9.1*  HCT 30.8* 29.9*  PLT 112* 130*   BMET:  Recent Labs    06/18/18 0403 06/19/18 0620  NA 138 135  K 4.2 4.9  CL 98 99  CO2 25 24  GLUCOSE 102* 137*  BUN 16 25*  CREATININE 2.82* 3.55*  CALCIUM 8.1* 7.8*    PT/INR:  Recent Labs    06/19/18 0620  LABPROT 18.9*  INR 1.6*   ABG    Component Value Date/Time   PHART 7.427 06/19/2018 0350   HCO3 24.4 06/19/2018 0350   TCO2 27 12/03/2016 0751   ACIDBASEDEF 9.0 (H) 01/23/2009 0429   O2SAT 98.8 06/19/2018 0350   CBG (last 3)  Recent Labs    06/18/18 2357 06/19/18 0330 06/19/18 0612  GLUCAP 150* 118* 125*    Assessment/Plan: S/P Procedure(s) (LRB): VIDEO ASSISTED THORACOSCOPY  (VATS)/ DRAINAGE OF EMPYEMA (Right)  1.CV-Atrial fibrillation, rate low 100s. She is on PO Amiodarone 200mg  BID and Lopressor 25mg  BID. Lovenox for DVT prophylaxis. Continue lipitor. 2. Pulm-slightly improved aeration and volumes within the right lung. No pneumothorax on CXR. Chest tubes put out 500cc/since surgery. Keep. On room air with good oxygen saturation.  3. Renal-ESRD on HD. Dialysis per Nephrology. Needs nutritional support but will leave this up to medicine. 4. H and H stable, expected acute blood loss anemia 5. Endo-blood glucose well controlled. Not eating much at this point due to lack of appetite.  6. Empyema-continue IV antibiotics Maxipime and Vanco. May need ID to see if they will need to follow-up with her outpatient. No recent fevers, actually temps have been on the low side. Following OR cultures which are pending.   Plan: Continue chest tubes. CXR in the AM. Dialysis per Nephrology. Continue IV antibiotics. PT consult due to extreme fatigue and weakness. Continue care per medicine.     LOS: 7 days    Elgie Collard 06/19/2018   I have seen and examined the patient and agree with the assessment and plan as outlined.  Patient has trapped lung which is easily seen on current chest xray.  Will leave tube in place until output minimal, but she will likely eventually develop recurrent right pleural effusion once  the tube has been removed, and she will remain at some risk for recurrent empyema.   Okay to gradually resume warfarin anticoagulation for atrial fibrillation but I would not plan to bridge her with systemic anticoagulation due to risk of bleeding during the perioperative period.  Low dose lovenox okay for DVT prophylaxis.  Remainder of care per medical teams.   Rexene Alberts, MD 06/19/2018 12:26 PM

## 2018-06-19 NOTE — Progress Notes (Signed)
Progress Note  Patient Name: Vanessa Webb Date of Encounter: 06/19/2018  Primary Cardiologist: Carlyle Dolly, MD   Subjective   Some discomfort related to surgery; no dyspnea  Inpatient Medications    Scheduled Meds: . amiodarone  200 mg Oral BID  . atorvastatin  20 mg Oral QHS  . bisacodyl  10 mg Oral Daily  . collagenase   Topical Daily  . enoxaparin (LOVENOX) injection  30 mg Subcutaneous Q24H  . feeding supplement (NEPRO CARB STEADY)  237 mL Oral BID BM  . Gerhardt's butt cream   Topical QID  . insulin aspart  0-24 Units Subcutaneous Q6H  . metoprolol tartrate  25 mg Oral BID  . multivitamin  1 tablet Oral QHS  . senna-docusate  1 tablet Oral QHS   Continuous Infusions: . ceFEPime (MAXIPIME) IV 2 g (06/17/18 1730)  . potassium chloride     PRN Meds: acetaminophen **OR** acetaminophen, acetaminophen, antiseptic oral rinse, loperamide, morphine injection, ondansetron (ZOFRAN) IV, potassium chloride, traMADol   Vital Signs    Vitals:   06/19/18 0535 06/19/18 0700 06/19/18 0702 06/19/18 0800  BP:  114/78 114/78   Pulse:   (!) 108   Resp:  16 16   Temp: 98.5 F (36.9 C)  (!) 97.5 F (36.4 C) 97.9 F (36.6 C)  TempSrc: Oral  Axillary Axillary  SpO2:   96%   Weight:      Height:        Intake/Output Summary (Last 24 hours) at 06/19/2018 0906 Last data filed at 06/19/2018 0800 Gross per 24 hour  Intake 1235 ml  Output 1610 ml  Net -375 ml   Last 3 Weights 06/19/2018 06/18/2018 06/17/2018  Weight (lbs) 116 lb 10 oz 114 lb 10.2 oz 116 lb 2.9 oz  Weight (kg) 52.9 kg 52 kg 52.7 kg      Telemetry    Atrial fibrillation with PVCs or aberrantly conducted beats- Personally Reviewed  Physical Exam   GEN: WD WN Neck: supple, mildly tachycardic Cardiac: irregular Respiratory: Diminished BS RLL; CT in place GI: Soft, NT/ND MS: No edema Neuro:  Grossly intact   Labs    Chemistry Recent Labs  Lab 06/13/18 2041  06/16/18 0537 06/17/18 2121 06/18/18  0403 06/19/18 0620  NA  --    < > 138 137 138 135  K  --    < > 4.4 4.6 4.2 4.9  CL  --    < > 96* 98 98 99  CO2  --    < > 23 25 25 24   GLUCOSE  --    < > 120* 118* 102* 137*  BUN  --    < > 26* 14 16 25*  CREATININE  --    < > 3.91* 2.53* 2.82* 3.55*  CALCIUM  --    < > 8.0* 7.9* 8.1* 7.8*  PROT 6.2*  --   --   --   --   --   ALBUMIN 1.7*   < > 1.6* 1.5* 1.6*  --   AST 64*  --   --   --   --   --   ALT 31  --   --   --   --   --   ALKPHOS 73  --   --   --   --   --   BILITOT 0.7  --   --   --   --   --   GFRNONAA  --    < >  11* 18* 16* 12*  GFRAA  --    < > 12* 21* 18* 14*  ANIONGAP  --    < > 19* 14 15 12    < > = values in this interval not displayed.     Hematology Recent Labs  Lab 06/17/18 0452 06/18/18 0403 06/19/18 0620  WBC 11.4* 9.2 11.0*  RBC 4.19 3.69* 3.46*  HGB 10.7* 9.7* 9.1*  HCT 35.4* 30.8* 29.9*  MCV 84.5 83.5 86.4  MCH 25.5* 26.3 26.3  MCHC 30.2 31.5 30.4  RDW 17.5* 17.4* 17.9*  PLT 130* 112* 130*      Radiology    Dg Chest Port 1 View  Result Date: 06/19/2018 CLINICAL DATA:  73 year old female with a right-sided pneumothorax EXAM: PORTABLE CHEST 1 VIEW COMPARISON:  Prior chest x-ray 06/18/2018 FINDINGS: Right-sided thoracostomy tube remains in good position. Right IJ central venous catheter with the tip at the superior cavoatrial junction no residual right-sided pneumothorax. There is persistent pleural thickening versus pleural effusion. Stable borderline cardiomegaly. Mediastinal contours remain unchanged. Atherosclerotic calcifications again noted in the transverse aorta. Small left pleural effusion with associated left basilar atelectasis. Overall, slightly improved aeration of the right lung. Background bronchitic changes are stable. IMPRESSION: 1. Slightly improved aeration and volumes within the right lung. 2. Persistent patchy airspace opacity in the right lower lobe likely reflects underlying pneumonia or residual atelectasis. 3.  Well-positioned right-sided thoracostomy tube without evidence of pneumothorax. 4. Persistent mild right pleural thickening versus residual pleural effusion. 5. Small layering left pleural effusion and associated left basilar atelectasis. 6. The tip of the right IJ central venous catheter overlies the cavoatrial junction in good position. Electronically Signed   By: Jacqulynn Cadet M.D.   On: 06/19/2018 07:47   Dg Chest Port 1 View  Result Date: 06/18/2018 CLINICAL DATA:  Follow-up central line placement EXAM: PORTABLE CHEST 1 VIEW COMPARISON:  06/17/2018 FINDINGS: Right jugular central line is noted at the cavoatrial junction. No pneumothorax is seen. Previously noted pigtail catheter has been removed and a new large bore chest tube has been placed on the right. Persistent pleural thickening is noted laterally as well as parenchymal density in the base somewhat improved from the prior study. Small left-sided pleural effusion remains. Chronic changes about the proximal left humerus are noted. IMPRESSION: No pneumothorax following central line placement. New large bore chest tube is noted on the right with improved aeration in the right base. Electronically Signed   By: Inez Catalina M.D.   On: 06/18/2018 10:44    Patient Profile     Vanessa Webb is a 73 y.o. female with a history of paroxysmal atrial fibrillation, chronic anticoagulation therapy with Coumadin, ESRD on hemodialysis (T/Th/Sat), chronic anemia, chronicthrombocytopenia,type 2 diabetes mellitus, hypertension, and hyperlipidemiawho is being seen for the evaluation ofatrial fibrillation with RVRat the request of Dr. Cruzita Lederer (Internal Medicine). Also with empyema.  Assessment & Plan    1 persistent atrial fibrillation-HR high normal; continue amiodarone (decrease to 200 mg daily in one week) and metoprolol. Continue coumadin. Plan DCCV once INR therapeutic for 3 consecutive weeks.   2 pleural effusion-empyema. S/P right VATS and drainage  of right empyema.  3 end-stage renal disease-dialysis per nephrology.  4 hypertension-patient's blood pressure is controlled.  Continue present medications and follow.  5 hyperlipidemia-continue statin.  We will follow from a distance  For questions or updates, please contact [place] Please consult [email or web address] for contact info under        Signed, Kirk Ruths,  MD  06/19/2018, 9:06 AM

## 2018-06-20 ENCOUNTER — Inpatient Hospital Stay (HOSPITAL_COMMUNITY): Payer: Medicare Other

## 2018-06-20 DIAGNOSIS — I4819 Other persistent atrial fibrillation: Secondary | ICD-10-CM

## 2018-06-20 LAB — GLUCOSE, CAPILLARY
Glucose-Capillary: 103 mg/dL — ABNORMAL HIGH (ref 70–99)
Glucose-Capillary: 68 mg/dL — ABNORMAL LOW (ref 70–99)
Glucose-Capillary: 98 mg/dL (ref 70–99)
Glucose-Capillary: 114 mg/dL — ABNORMAL HIGH (ref 70–99)
Glucose-Capillary: 106 mg/dL — ABNORMAL HIGH (ref 70–99)

## 2018-06-20 LAB — PROTIME-INR
INR: 1.4 — ABNORMAL HIGH (ref 0.8–1.2)
Prothrombin Time: 17.2 seconds — ABNORMAL HIGH (ref 11.4–15.2)

## 2018-06-20 LAB — CBC
HCT: 29.6 % — ABNORMAL LOW (ref 36.0–46.0)
Hemoglobin: 8.9 g/dL — ABNORMAL LOW (ref 12.0–15.0)
MCH: 26.3 pg (ref 26.0–34.0)
MCHC: 30.1 g/dL (ref 30.0–36.0)
MCV: 87.3 fL (ref 80.0–100.0)
Platelets: 131 10*3/uL — ABNORMAL LOW (ref 150–400)
RBC: 3.39 MIL/uL — ABNORMAL LOW (ref 3.87–5.11)
RDW: 17.7 % — ABNORMAL HIGH (ref 11.5–15.5)
WBC: 10.5 10*3/uL (ref 4.0–10.5)
nRBC: 0 % (ref 0.0–0.2)

## 2018-06-20 LAB — PHOSPHORUS: Phosphorus: 2.9 mg/dL (ref 2.5–4.6)

## 2018-06-20 LAB — MAGNESIUM: Magnesium: 1.9 mg/dL (ref 1.7–2.4)

## 2018-06-20 MED ORDER — WARFARIN SODIUM 2 MG PO TABS
2.0000 mg | ORAL_TABLET | Freq: Once | ORAL | Status: AC
  Administered 2018-06-20: 2 mg via ORAL
  Filled 2018-06-20: qty 1

## 2018-06-20 MED ORDER — CHLORHEXIDINE GLUCONATE CLOTH 2 % EX PADS
6.0000 | MEDICATED_PAD | Freq: Every day | CUTANEOUS | Status: DC
  Administered 2018-06-23 – 2018-06-27 (×3): 6 via TOPICAL

## 2018-06-20 MED ORDER — DARBEPOETIN ALFA 40 MCG/0.4ML IJ SOSY
40.0000 ug | PREFILLED_SYRINGE | Freq: Once | INTRAMUSCULAR | Status: AC
  Administered 2018-06-20: 40 ug via INTRAVENOUS
  Filled 2018-06-20: qty 0.4

## 2018-06-20 NOTE — Progress Notes (Signed)
Vanessa Webb for Infectious Disease   Reason for visit: Follow up on empyema  Antimicrobials: Cefepime, day 5  Interval History: CT placed to H20 seal by CT surgery this AM. Appetite remains poor but beginning to improve somewhat. Sat up in chair briefly this afternoon, OTW no ambulation, c/o CP near drain  Fever curve, WBCs, ABX, and imaging all independently reviewed   Physical Exam: Lines: RT chest tube, RT IJ TLC Constitutional: chronically ill, small stature, A&O x 3, mild distress secondary to pleuritic CP Vitals:   06/20/18 1129 06/20/18 1536  BP: (!) 99/58 (!) 98/52  Pulse: 98 (!) 117  Resp: (!) 21 (!) 23  Temp: 97.7 F (36.5 C) (!) 97.5 F (36.4 C)  SpO2: 98% 97%  EYES: anicteric, PERRL, EOMI ENMT: dry MM, OP clear, mild bitemporal wasting Neck: mild JVD, supple Cardiovascular: tachycardic rate, irregular rhythm, no murmurs appreciated Respiratory: decreased BS at RT base, RT sided CT intact and connected to LWS, no wheeze GI: soft, NTND, +BS Musculoskeletal: normal muscle tone, slight atrophy Extrems: no LE edema, RT arm AVF with good palpable thrill and audible bruit Skin: poor skin turgor, no rashes Neuro: CN II-XII grossly intact, no focal neurologic deficits, gait was not assessed as pt evaluated on HD Lines: RT IJ TLC, RT chest tube   Review of Systems: +malaise, FTT x 3 weeks PTA, +DOE and pleuritic CP x 1 week PTA, no F/C, no N/V/D, no HA, +anuric All other systems reviewed and are negative    Lab Results  Component Value Date   WBC 10.5 06/20/2018   HGB 8.9 (L) 06/20/2018   HCT 29.6 (L) 06/20/2018   MCV 87.3 06/20/2018   PLT 131 (L) 06/20/2018    Lab Results  Component Value Date   CREATININE 2.54 (H) 06/20/2018   BUN 15 06/20/2018   NA 135 06/20/2018   K 3.8 06/20/2018   CL 95 (L) 06/20/2018   CO2 27 06/20/2018    Lab Results  Component Value Date   ALT 9 06/20/2018   AST 20 06/20/2018   ALKPHOS 65 06/20/2018      Microbiology: Recent Results (from the past 240 hour(s))  MRSA PCR Screening     Status: None   Collection Time: 06/11/18  6:23 AM  Result Value Ref Range Status   MRSA by PCR NEGATIVE NEGATIVE Final    Comment:        The GeneXpert MRSA Assay (FDA approved for NASAL specimens only), is one component of a comprehensive MRSA colonization surveillance program. It is not intended to diagnose MRSA infection nor to guide or monitor treatment for MRSA infections. Performed at [facility], [place] [address] [place], [place] [address]   Gram stain     Status: None   Collection Time: 06/13/18  5:04 PM  Result Value Ref Range Status   Specimen Description FLUID RIGHT PLEURAL  Final   Special Requests NONE  Final   Gram Stain   Final    ABUNDANT WBC PRESENT,BOTH PMN AND MONONUCLEAR NO ORGANISMS SEEN Performed at [facility], [address], [place], [place] [address]    Report Status 06/13/2018 FINAL  Final  Acid Fast Smear (AFB)     Status: None   Collection Time: 06/13/18  5:04 PM  Result Value Ref Range Status   AFB Specimen Processing Concentration  Final   Acid Fast Smear Negative  Final    Comment: (NOTE) Performed At: [facility] [address] [place], [place] [id number] Rush Farmer MD FA:[id number]    Source (AFB) FLUID  Final    Comment: PLEURAL RIGHT Performed at [facility], [place] [address] [place], [place] [address]   Culture, body fluid-bottle     Status: Abnormal   Collection Time: 06/13/18  5:04 PM  Result Value Ref Range Status   Specimen Description FLUID RIGHT PLEURAL  Final   Special Requests BOTTLES DRAWN AEROBIC AND ANAEROBIC  Final   Gram Stain   Final    GRAM NEGATIVE RODS AEROBIC BOTTLE ONLY CRITICAL RESULT CALLED TO, READ BACK BY AND VERIFIED WITH: E. MONGE, RN CONCERNING GRAM STAIN AT 5284 ON 06/14/18 BY C. JESSUP, MLT Performed at [facility], [place] [address] [place], [place] [address]    Culture  PSEUDOMONAS AERUGINOSA (A)  Final   Report Status 06/17/2018 FINAL  Final   Organism ID, Bacteria PSEUDOMONAS AERUGINOSA  Final      Susceptibility   Pseudomonas aeruginosa - MIC*    CEFTAZIDIME 4 SENSITIVE Sensitive     CIPROFLOXACIN <=0.25 SENSITIVE Sensitive     GENTAMICIN <=1 SENSITIVE Sensitive     IMIPENEM 2 SENSITIVE Sensitive     PIP/TAZO 16 SENSITIVE Sensitive     CEFEPIME <=1 SENSITIVE Sensitive     * PSEUDOMONAS AERUGINOSA  Surgical pcr screen     Status: None   Collection Time: 06/17/18  9:47 PM  Result Value Ref Range Status   MRSA, PCR NEGATIVE NEGATIVE Final   Staphylococcus aureus NEGATIVE NEGATIVE Final    Comment: (NOTE) The Xpert SA Assay (FDA approved for NASAL specimens in patients 31 years of age and older), is one component of a comprehensive surveillance program. It is not intended to diagnose infection nor to guide or monitor treatment. Performed at [facility], [place] [address], [place],  [address]     Impression/Plan: The patient is a 73 y/o WF diabetic with ESRD on HD admitted to OSH with FTT in Afib with RVR found to have a RT sided pleural effusion transitioning to an empyema/trapped lung, s/p VATS with chest tube drainage.  1. Empyema -while Pseudomonas is an unusual community-acquired pathogen, this was isolated from her pleural fluid culture following her thoracentesis.  Patient denies receiving any antecedent antibiotics prior to her admission despite her prolonged generalized illness.  Interestingly, the patient did not report dyspnea or cough so much as pleuritic chest pain in the 3 weeks prior to her admission here.  She underwent successful VATS/decortication for empyema on April 22 and now has a right-sided chest tube that remains intact.  Will defer management of her chest tube to CT surgery.  Continue cefepime monotherapy, favoring a dosing regimen of 2 g IV daily for the present time.  Given the patient's A. fib with RVR recently, I  would be cautious using a quinolone only for outpatient treatment of her pathogen. Her isolate was pansensitive.  I do suspect that the patient developed this complication as a result of a community-acquired pneumonia for which she did not seek initial care.  Would anticipate a 4-week course of antibiotics in total from the time of her decortication with repeat chest imaging used to assist in determining final duration of treatment. Tentative ABX d/c date is 07/13/2018.  2. Leukocytosis -the patient's white blood cell count peaked around approximately 20,000 prior to her thoracentesis.  Since evacuation of her fluid collection and particularly since her VATS the patient's white blood cell count is  now normalized along with empiric antibiotics.  Check this patient CBC with differential regularly now that she has her white blood cell count returning to normal limits.  3. ESRD - The patient normally dialyzes Tuesday, Thursday, Saturday as an outpatient.  While parenteral therapy would be preferred given that she has a gram-negative pathogen, she may require daily dosing of IV antibiotics and therefore would require a second IV line.  One alternative strategy might involve giving the patient parenteral cefepime while at dialysis to be supplemented with oral quinolone on her nondialysis days.  Will review final dosing strategies as the patient clinically improves and is nearing discharge.  We will discussed this case with our infectious disease pharmacy team as well.

## 2018-06-20 NOTE — Progress Notes (Signed)
Rehab Admissions Coordinator Note:  Patient was screened by Cleatrice Burke for appropriateness for an Inpatient Acute Rehab Consult per PT recs.  At this time, we are recommending Inpatient Rehab consult as well as OT eval.  [name] RN MSN 06/20/2018, 3:56 PM  I can be reached at [phone].

## 2018-06-20 NOTE — Progress Notes (Signed)
Patient ID: Vanessa Webb, female   DOB: Oct 01, 1945, 73 y.o.   MRN: [id number] [facility] Progress Note   Assessment/ Plan:   1.  Atrial fibrillation with rapid ventricular response:  per cardiology; metoprolol and amio.  For future elective cardioversion per cardiology.  Note that hypotension has limited UF with HD in the past and HR running 100-130's with HD.  2.  End-stage renal disease: HD is per TTS schedule; next tx on 4/25.  Normally at [facility] [place].  Note previous dry weight appears to be 53 kg on outpatient records.  Anticipate adjustment down needed   3. Pleural effusion with empyema - s/p VATS.  With chest tube   4.  Hyponatremia: improved with HD/UF, advised to limit intake.   5. 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  Start aranesp - 4/24   5.  Secondary hyperparathyroidism: We will follow calcium/phosphorus levels. Stable   6.  Protein calorie malnutrition: Significant hypoalbuminemia noted most possibly arising from nonhealing leg ulcer/acute phase reactant.  Continue renal vitamin/ONS.  7.  Hallucinations: off of Lomotil and metronidazole   Subjective:   Arm swollen after HD.  Propped up and better per nursing.  Had no UF with the 4/23 HD treatment as she was below her dry weight.  HR was 100-130's during HD.  Review of systems:  Denies shortness of breath or chest pain  Denies nausea or vomiting    Objective:   BP 104/75 (BP Location: Left Arm)   Pulse (!) 121   Temp (!) 97.5 F (36.4 C) (Oral)   Resp 19   Ht 5\' 4"  (1.626 m)   Wt 51.4 kg   SpO2 99%   BMI 19.45 kg/m   Physical Exam: Gen: adult female in bed in NAD  CVS: S1S2; no rub Resp: chest tube in place right chest with decreased breath sounds; left lung clear; unlabored Abd: Soft, flat, nontender Ext: no lower extremity edema; RUE swollen, on pillow Access: right AVF with bruit and thrill   Labs: BMET Recent Labs  Lab 06/14/18 0736 06/15/18 0406 06/16/18 0537 06/17/18 2121  06/18/18 0403 06/19/18 0620 06/20/18 0500  NA 136 138 138 137 138 135 135  K 4.2 3.6 4.4 4.6 4.2 4.9 3.8  CL 95* 96* 96* 98 98 99 95*  CO2 24 27 23 25 25 24 27   GLUCOSE 105* 95 120* 118* 102* 137* 108*  BUN 36* 19 26* 14 16 25* 15  CREATININE 4.81* 3.00* 3.91* 2.53* 2.82* 3.55* 2.54*  CALCIUM 8.1* 7.9* 8.0* 7.9* 8.1* 7.8* 7.9*  PHOS 4.1  --  4.1 2.5 2.9  --  2.9   CBC Recent Labs  Lab 06/17/18 0452 06/18/18 0403 06/19/18 0620 06/20/18 0500  WBC 11.4* 9.2 11.0* 10.5  HGB 10.7* 9.7* 9.1* 8.9*  HCT 35.4* 30.8* 29.9* 29.6*  MCV 84.5 83.5 86.4 87.3  PLT 130* 112* 130* 131*   Medications:    . amiodarone  200 mg Oral BID  . atorvastatin  20 mg Oral QHS  . bisacodyl  10 mg Oral Daily  . collagenase   Topical Daily  . enoxaparin (LOVENOX) injection  30 mg Subcutaneous Q24H  . feeding supplement (NEPRO CARB STEADY)  237 mL Oral BID BM  . Gerhardt's butt cream   Topical QID  . insulin aspart  0-24 Units Subcutaneous Q6H  . metoprolol tartrate  25 mg Oral BID  . multivitamin  1 tablet Oral QHS  . senna-docusate  1 tablet Oral QHS  .  Warfarin - Pharmacist Dosing Inpatient   Does not apply q1800    Claudia Desanctis 06/20/2018

## 2018-06-20 NOTE — Progress Notes (Signed)
Results for ANTONISHA, WASKEY (MRN [id number]) as of 06/20/2018 12:07  Ref. Range 06/19/2018 17:16 06/19/2018 23:07 06/20/2018 05:34 06/20/2018 11:28  Glucose-Capillary Latest Ref Range: 70 - 99 mg/dL 90 91 103 (H) 68 (L)  Noted that blood sugars have been less than 100 mg/dl and CBG was 68 mg/dl this am.   Recommend changing Novolog correction scale to SENSITIVE (0-9 units) every 6 hours if blood sugar continue to be low.   Harvel Ricks RN BSN CDE Diabetes Coordinator Pager: [phone]  8am-5pm

## 2018-06-20 NOTE — Progress Notes (Signed)
Progress Note  Patient Name: Vanessa Webb Date of Encounter: 06/20/2018  Primary Cardiologist: Carlyle Dolly, MD   Subjective   No CP or dyspnea  Inpatient Medications    Scheduled Meds: . amiodarone  200 mg Oral BID  . atorvastatin  20 mg Oral QHS  . bisacodyl  10 mg Oral Daily  . collagenase   Topical Daily  . enoxaparin (LOVENOX) injection  30 mg Subcutaneous Q24H  . feeding supplement (NEPRO CARB STEADY)  237 mL Oral BID BM  . Gerhardt's butt cream   Topical QID  . insulin aspart  0-24 Units Subcutaneous Q6H  . metoprolol tartrate  25 mg Oral BID  . multivitamin  1 tablet Oral QHS  . senna-docusate  1 tablet Oral QHS  . Warfarin - Pharmacist Dosing Inpatient   Does not apply q1800   Continuous Infusions: . ceFEPime (MAXIPIME) IV 2 g (06/19/18 1520)  . potassium chloride     PRN Meds: acetaminophen **OR** acetaminophen, acetaminophen, antiseptic oral rinse, loperamide, morphine injection, ondansetron (ZOFRAN) IV, potassium chloride, traMADol   Vital Signs    Vitals:   06/19/18 2308 06/20/18 0348 06/20/18 0628 06/20/18 0718  BP: 108/70 104/75  (!) 90/56  Pulse: (!) 121 (!) 121  (!) 107  Resp: (!) 22 19  20   Temp: (!) 97.5 F (36.4 C) (!) 97.5 F (36.4 C)  97.6 F (36.4 C)  TempSrc: Oral Oral  Oral  SpO2: 100% 99%  99%  Weight:   51.4 kg   Height:        Intake/Output Summary (Last 24 hours) at 06/20/2018 1103 Last data filed at 06/20/2018 0718 Gross per 24 hour  Intake 420 ml  Output 250 ml  Net 170 ml   Last 3 Weights 06/20/2018 06/19/2018 06/19/2018  Weight (lbs) 113 lb 5.1 oz 114 lb 10.2 oz 114 lb 10.2 oz  Weight (kg) 51.4 kg 52 kg 52 kg      Telemetry    Atrial fibrillation rate elevated- Personally Reviewed  Physical Exam   GEN: WD somewhat frail Neck: supple Cardiac: irregular, tachycardic Respiratory: Diminished BS RLL; CT in place; no wheeze GI: Soft, NT/ND, no masses MS: No edema Neuro:  No focal findings  Labs    Chemistry  Recent Labs  Lab 06/13/18 2041  06/17/18 2121 06/18/18 0403 06/19/18 0620 06/20/18 0500  NA  --    < > 137 138 135 135  K  --    < > 4.6 4.2 4.9 3.8  CL  --    < > 98 98 99 95*  CO2  --    < > 25 25 24 27   GLUCOSE  --    < > 118* 102* 137* 108*  BUN  --    < > 14 16 25* 15  CREATININE  --    < > 2.53* 2.82* 3.55* 2.54*  CALCIUM  --    < > 7.9* 8.1* 7.8* 7.9*  PROT 6.2*  --   --   --   --  5.3*  ALBUMIN 1.7*   < > 1.5* 1.6*  --  1.6*  AST 64*  --   --   --   --  20  ALT 31  --   --   --   --  9  ALKPHOS 73  --   --   --   --  65  BILITOT 0.7  --   --   --   --  0.7  GFRNONAA  --    < > 18* 16* 12* 18*  GFRAA  --    < > 21* 18* 14* 21*  ANIONGAP  --    < > 14 15 12 13    < > = values in this interval not displayed.     Hematology Recent Labs  Lab 06/18/18 0403 06/19/18 0620 06/20/18 0500  WBC 9.2 11.0* 10.5  RBC 3.69* 3.46* 3.39*  HGB 9.7* 9.1* 8.9*  HCT 30.8* 29.9* 29.6*  MCV 83.5 86.4 87.3  MCH 26.3 26.3 26.3  MCHC 31.5 30.4 30.1  RDW 17.4* 17.9* 17.7*  PLT 112* 130* 131*      Radiology    Dg Chest Port 1 View  Result Date: 06/20/2018 CLINICAL DATA:  73 year old female with loculated right pleural effusion, right chest empyema, right chest tube. EXAM: PORTABLE CHEST 1 VIEW COMPARISON:  06/19/2018 and earlier. FINDINGS: Portable AP semi upright view at 0615 hours. Stable right chest tube and right IJ central line. Residual right lung loculated fluid or pleural thickening is stable. No pneumothorax. Patchy right perihilar opacity is stable. Stable cardiac size and mediastinal contours. Stable left lung with small pleural effusion suspected. Stable visible IMPRESSION: 1. Stable right chest tube and right IJ central line. 2. No pneumothorax small volume residual loculated right pleural fluid or pleural thickening. 3. Small left pleural effusion suspected. Electronically Signed   By: Genevie Ann M.D.   On: 06/20/2018 08:00   Dg Chest Port 1 View  Result Date: 06/19/2018  CLINICAL DATA:  73 year old female with a right-sided pneumothorax EXAM: PORTABLE CHEST 1 VIEW COMPARISON:  Prior chest x-ray 06/18/2018 FINDINGS: Right-sided thoracostomy tube remains in good position. Right IJ central venous catheter with the tip at the superior cavoatrial junction no residual right-sided pneumothorax. There is persistent pleural thickening versus pleural effusion. Stable borderline cardiomegaly. Mediastinal contours remain unchanged. Atherosclerotic calcifications again noted in the transverse aorta. Small left pleural effusion with associated left basilar atelectasis. Overall, slightly improved aeration of the right lung. Background bronchitic changes are stable. IMPRESSION: 1. Slightly improved aeration and volumes within the right lung. 2. Persistent patchy airspace opacity in the right lower lobe likely reflects underlying pneumonia or residual atelectasis. 3. Well-positioned right-sided thoracostomy tube without evidence of pneumothorax. 4. Persistent mild right pleural thickening versus residual pleural effusion. 5. Small layering left pleural effusion and associated left basilar atelectasis. 6. The tip of the right IJ central venous catheter overlies the cavoatrial junction in good position. Electronically Signed   By: Jacqulynn Cadet M.D.   On: 06/19/2018 07:47    Patient Profile     Vanessa Webb is a 73 y.o. female with a history of paroxysmal atrial fibrillation, chronic anticoagulation therapy with Coumadin, ESRD on hemodialysis (T/Th/Sat), chronic anemia, chronicthrombocytopenia,type 2 diabetes mellitus, hypertension, and hyperlipidemiawho is being seen for the evaluation ofatrial fibrillation with RVRat the request of Dr. Cruzita Lederer (Internal Medicine). Also with empyema.  Assessment & Plan    1 persistent atrial fibrillation-heart rate mildly elevated today.  We will continue with amiodarone and metoprolol.  I am hesitant to advance metoprolol as blood pressure is  borderline and she has having issues with hypotension on dialysis.  We will follow heart rate and if reasonably well controlled in the hospital we will plan to proceed with cardioversion 3 weeks after INR therapeutic.  If rate difficult to control then we will proceed with TEE guided cardioversion once INR therapeutic.  As outlined previously decrease amiodarone to 200 mg  daily in 1 week.    2 pleural effusion-empyema. S/P right VATS and drainage of right empyema.  Chest tube in place.  3 end-stage renal disease-dialysis per nephrology.  4 hyperlipidemia-continue statin.   For questions or updates, please contact [place] Please consult [email or web address] for contact info under        Signed, Kirk Ruths, MD  06/20/2018, 11:03 AM

## 2018-06-20 NOTE — Progress Notes (Signed)
Hypoglycemic Event  CBG: 68 mg/dl  Treatment: 8 oz juice/soda  Symptoms: None  Follow-up CBG: Time:1200 CBG Result 98  Possible Reasons for Event: Inadequate meal intake  Comments/MD notified:dr. Macarthur Critchley, Ogechi Kuehnel I

## 2018-06-20 NOTE — Evaluation (Signed)
Physical Therapy Evaluation Patient Details Name: Vanessa Webb MRN: [id number] DOB: [birth year] Today's Date: 06/20/2018   History of Present Illness   Vanessa Webb is a 73 y.o. female with medical history significant of ESRD on TTS schedule, hypertension, hyperlipidemia, type 2 diabetes mellitus, on chronic anticoagulation with Coumadin presents to the hospital with chief complaint of generalized weakness.  Found to be in Afib with RVR.  CT showed large right sided pleural effusion.  pt s/p right thoracocentensis on 4/17.  Chest tube placed by IR 4/19.  Pt s/p VATS for empyema with pseudomonas 4/22.  Clinical Impression  Pt admitted with/for weakness and found to have pleural effusions and empyema.  S/p VATS.  Pt presently is significantly weak and uncoordinated when mobilizing.  Pt currently limited functionally due to the problems listed. ( See problems list.)   Pt will benefit from PT to maximize function and safety in order to get ready for next venue listed below.     Follow Up Recommendations CIR;Supervision/Assistance - 24 hour    Equipment Recommendations  Other (comment)(TBA)    Recommendations for Other Services Rehab consult     Precautions / Restrictions Precautions Precautions: Fall;Other (comment)(Chest tube R)      Mobility  Bed Mobility Overal bed mobility: Needs Assistance Bed Mobility: Supine to Sit     Supine to sit: Mod assist;Max assist     General bed mobility comments: assist to come up and forward, support for pt's ability to get R UE involved.  Transfers Overall transfer level: Needs assistance   Transfers: Sit to/from Stand;Stand Pivot Transfers Sit to Stand: Mod assist Stand pivot transfers: Max assist       General transfer comment: 4 trials of standing.  Assist for coming forward and up.  pt unable to maintain standing more than 20 secs during peri care for loose stool.  Once pivot was initiated both LE's/feet appeared mildly ataxic and as  if sliding forward uncontrollably.  Ambulation/Gait             General Gait Details: Unable  Stairs            Wheelchair Mobility    Modified Rankin (Stroke Patients Only)       Balance Overall balance assessment: Needs assistance   Sitting balance-Leahy Scale: Fair Sitting balance - Comments: able to maintain sitting without assist, but as she fatigued over 10 minutes she used UE's   Standing balance support: During functional activity Standing balance-Leahy Scale: Poor                               Pertinent Vitals/Pain Pain Assessment: Faces Faces Pain Scale: No hurt    Home Living Family/patient expects to be discharged to:: Private residence Living Arrangements: Spouse/significant other Available Help at Discharge: Family(? `24/7) Type of Home: House Home Access: Stairs to enter Entrance Stairs-Rails: None Entrance Stairs-Number of Steps: 2 STE Home Layout: One level Home Equipment: Tub bench;Grab bars - tub/shower;Bedside commode;Walker - 2 wheels      Prior Function Level of Independence: Needs assistance(has needed assist or AD for over a month)         Comments: Husband drives her to and from HD     Hand Dominance   Dominant Hand: Right    Extremity/Trunk Assessment   Upper Extremity Assessment Upper Extremity Assessment: Defer to OT evaluation    Lower Extremity Assessment Lower Extremity Assessment: Generalized weakness;RLE deficits/detail;LLE  deficits/detail RLE Deficits / Details: Weaker than left overall,  quads 3+ otherwise hip flexor, hams pf 3/5, df 3-  Noticeably incoordination during pivots to chair and quickness to fatigue and sag. RLE Coordination: decreased fine motor LLE Deficits / Details: grossly 3+ at quads and hip flexors, 3/5 at hams df/pf.  uncoordinated movement.  poor coordination of pivot. LLE Coordination: decreased fine motor       Communication   Communication: No difficulties  Cognition  Arousal/Alertness: Awake/alert Behavior During Therapy: Flat affect Overall Cognitive Status: No family/caregiver present to determine baseline cognitive functioning                                 General Comments: slow processing      General Comments General comments (skin integrity, edema, etc.): VSS    Exercises Other Exercises Other Exercises: warm up ROM exercise prior to mobility   Assessment/Plan    PT Assessment Patient needs continued PT services  PT Problem List Decreased strength;Decreased activity tolerance;Decreased balance;Decreased mobility;Decreased coordination;Decreased knowledge of use of DME       PT Treatment Interventions DME instruction;Gait training;Functional mobility training;Therapeutic activities;Balance training;Patient/family education    PT Goals (Current goals can be found in the Care Plan section)  Acute Rehab PT Goals Patient Stated Goal: Wants to get home as soon as she can. PT Goal Formulation: With patient Time For Goal Achievement: 07/04/18 Potential to Achieve Goals: Good    Frequency Min 3X/week   Barriers to discharge        Co-evaluation               AM-PAC PT "6 Clicks" Mobility  Outcome Measure Help needed turning from your back to your side while in a flat bed without using bedrails?: A Lot Help needed moving from lying on your back to sitting on the side of a flat bed without using bedrails?: A Lot Help needed moving to and from a bed to a chair (including a wheelchair)?: Total Help needed standing up from a chair using your arms (e.g., wheelchair or bedside chair)?: A Lot Help needed to walk in hospital room?: Total Help needed climbing 3-5 steps with a railing? : Total 6 Click Score: 9    End of Session   Activity Tolerance: Patient tolerated treatment well Patient left: in chair;with call bell/phone within reach Nurse Communication: Mobility status PT Visit Diagnosis: Other abnormalities of  gait and mobility (R26.89);Muscle weakness (generalized) (M62.81);Difficulty in walking, not elsewhere classified (R26.2);Unsteadiness on feet (R26.81)    Time: 1358-1440 PT Time Calculation (min) (ACUTE ONLY): 42 min   Charges:   PT Evaluation $PT Eval Moderate Complexity: 1 Mod PT Treatments $Therapeutic Activity: 23-37 mins        06/20/2018  Donnella Sham, PT Acute Rehabilitation Services [phone]  (pager) [phone]  (office)  Tessie Fass Boss Danielsen 06/20/2018, 3:51 PM

## 2018-06-20 NOTE — Progress Notes (Signed)
[place] for warfarin Indication: atrial fibrillation  Allergies  Allergen Reactions  . Sulfa Antibiotics Nausea Only    Patient Measurements: Height: 5\' 4"  (162.6 cm) Weight: 113 lb 5.1 oz (51.4 kg) IBW/kg (Calculated) : 54.7  Vital Signs: Temp: 97.7 F (36.5 C) (04/24 1129) Temp Source: Oral (04/24 1129) BP: 99/58 (04/24 1129) Pulse Rate: 98 (04/24 1129)  Labs: Recent Labs    06/17/18 1925  06/18/18 0403 06/19/18 0620 06/20/18 0500  HGB  --    < > 9.7* 9.1* 8.9*  HCT  --   --  30.8* 29.9* 29.6*  PLT  --   --  112* 130* 131*  APTT 33  --   --   --   --   LABPROT  --   --  19.1* 18.9* 17.2*  INR  --   --  1.6* 1.6* 1.4*  CREATININE  --    < > 2.82* 3.55* 2.54*   < > = values in this interval not displayed.    Estimated Creatinine Clearance: 15.8 mL/min (A) (by C-G formula based on SCr of 2.54 mg/dL (H)).   Medical History: Past Medical History:  Diagnosis Date  . Anemia    takes Folic Acid;pt states she gets an injection every 2wks   . Chronic combined systolic and diastolic congestive heart failure ([place])   . Closed right hip fracture, initial encounter ([place]) 10/19/2017  . Depression    but doesn't take any meds for it  . Diabetes mellitus    takes Lantus and Novolog 70/30;fasting blood sugar 200;Type 2  . GERD (gastroesophageal reflux disease)    takes Omeprazole daily  . GI bleeding   . History of bladder infections    last one a month ago;saw Dr.Wrenn and cysto was done in office;was on antibiotics and completed 2wks ago  . History of blood transfusion    no blood transfusion  . HLD (hyperlipidemia) 10/19/2017  . HTN (hypertension) 10/19/2017  . Hyperlipidemia    takes Zocor daily  . Hypertension    takes Coreg daily  . Mitral regurgitation   . Myocardial infarction ([place]) 2010  . PAF (paroxysmal atrial fibrillation) ([place])   . Pancreatitis   . Peripheral neuropathy   . Pleural effusion, bilateral   . 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 UTI (lower urinary tract infection)     Assessment: 73 year old female s/p VATS 4/22. Patient with history of afib on warfarin prior to admission.    PTA regimen is 2.5 mg daily except 1.25 mg on Saturday  Lovenox for DVT px ordered, new orders to start warfarin tonight. INR decreased from 1.6 to 1.4- warfarin restarted on 4/23. ABLA noted, Hgb 8.9. Vitamin k given 4/20.   Patient on cefepime for pseudomonas in pleural fluid, it is being dosing with hemodialysis. No changes for now  Goal of Therapy:  INR 2-3 Monitor platelets by anticoagulation protocol: Yes   Plan:  Warfarin 2 mg tonight Continue daily INR Cefepime 2g after each HD session - TTS  Antonietta Jewel, PharmD, [place] Clinical Pharmacist  Pager: [phone] Phone: [phone] 06/20/2018 3:06 PM

## 2018-06-20 NOTE — Progress Notes (Addendum)
      [address]       [place] [address]             [phone]     \ 2 Days Post-Op Procedure(s) (LRB): VIDEO ASSISTED THORACOSCOPY (VATS)/ DRAINAGE OF EMPYEMA (Right)   Subjective:  No new complaints.  Doing okay.  Not sure if they will dialyze her today.  Objective: Vital signs in last 24 hours: Temp:  [97.4 F (36.3 C)-97.9 F (36.6 C)] 97.5 F (36.4 C) (04/24 0348) Pulse Rate:  [86-131] 121 (04/24 0348) Cardiac Rhythm: Atrial fibrillation (04/24 0400) Resp:  [18-28] 19 (04/24 0348) BP: (94-121)/(46-75) 104/75 (04/24 0348) SpO2:  [98 %-100 %] 99 % (04/24 0348) Weight:  [51.4 kg-52 kg] 51.4 kg (04/24 0628)  Intake/Output from previous day: 04/23 0701 - 04/24 0700 In: 740 [P.O.:740] Out: 390 [Chest Tube:390]  General appearance: alert and cooperative Heart: irregularly irregular rhythm Lungs: diminished breath sounds right base Abdomen: soft, non-tender; bowel sounds normal; no masses,  no organomegaly Extremities: edema 2+ Wound: clean and dry, mild erythema no evidence of infection  Lab Results: Recent Labs    06/19/18 0620 06/20/18 0500  WBC 11.0* 10.5  HGB 9.1* 8.9*  HCT 29.9* 29.6*  PLT 130* 131*   BMET:  Recent Labs    06/19/18 0620 06/20/18 0500  NA 135 135  K 4.9 3.8  CL 99 95*  CO2 24 27  GLUCOSE 137* 108*  BUN 25* 15  CREATININE 3.55* 2.54*  CALCIUM 7.8* 7.9*    PT/INR:  Recent Labs    06/20/18 0500  LABPROT 17.2*  INR 1.4*   ABG    Component Value Date/Time   PHART 7.427 06/19/2018 0350   HCO3 24.4 06/19/2018 0350   TCO2 27 12/03/2016 0751   ACIDBASEDEF 9.0 (H) 01/23/2009 0429   O2SAT 98.8 06/19/2018 0350   CBG (last 3)  Recent Labs    06/19/18 1716 06/19/18 2307 06/20/18 0534  GLUCAP 90 91 103*    Assessment/Plan: S/P Procedure(s) (LRB): VIDEO ASSISTED THORACOSCOPY (VATS)/ DRAINAGE OF EMPYEMA (Right)  1. CV- rate controlled A. Fib- on oral Amiodarone, Lopressor 2. Pulm- no change in CXR  effusion is improved, however lung remains trapped, no pneumothorax 3. Renal- ESRD- per nephrology 4. ID- empyema, culture growing Pseudomonas, continue ABX 5. Dispo- care per primary, chest tube output was 390 yesterday, will transition to water seal, repeat CXR in AM   LOS: 8 days    Ellwood Handler 06/20/2018  I have seen and examined the patient and agree with the assessment and plan as outlined.  Chest tube to water seal.  Warfarin has been resumed.  Remainder of care per medical teams  Rexene Alberts, MD 06/20/2018 8:08 AM

## 2018-06-20 NOTE — Progress Notes (Signed)
PROGRESS NOTE    Vanessa Webb  UDJ:[id number] DOB: 1945-08-14 DOA: 06/11/2018 PCP: Monico Blitz, MD   Brief Narrative: 73 year old female with history of ESRD on HD TTS, hypertension, hyperlipidemia, A. fib on Coumadin for anticoagulation and chronic right leg ulcer transferred from [facility] for hemodialysis. Patient presented to [facility] with 2 weeks of generalized weakness and found to be in A. fib with RVR. She was placed on diltiazem drip transition to amiodarone prior to transfer. Patient is dialysis on 4/14 due to weakness. Reports good compliance with her metoprolol.  Of note, patient has had poor appetite, generalized weakness and about 40 pound weight loss over the last 1 year. She fell and broke her right hip 6 months ago and underwent IM nailing. She also fell and broke her left shoulder 3 months ago on the way to dialysis that has been treated conservatively.  Patient remained in A. fib with RVR on amiodarone drip. Initially, plan for DCCV if heart rate is not under good control. This has changed after talking to CTS about the pleural effusion and noted to have Large right-sided pleural effusion  on chest x-ray and CT chest. These appear to be chronic. She had US-guided diagnostic and therapeutic right thoracocentesis on 4/17 that yielded 300 cc exudative fluid by LDH. Pleural fluid Gram stain and culture with GNR. AFB smear negative. AFB culture and cytology pending. Started on Unasyn and Flagyl. Cardiothoracic surgery consulted on 4/19 recommended stopping warfarin, INR reversal and chest tube placement by IR. Pleural fluid culture with Pseudomonas aeruginosa and transitioned to cefepime on 06/16/2018. 4/20: IR chest tube placement and Gram stain negative, cytology consistent with acute inflammation. 4/22: VATS.  Subjective: Seen and examined.  No new complaints.  Denies shortness of breath chest pain nausea vomiting.  Saturating on room air.  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 Pathology shows "ACUTE FIBRINOUS PLEURITIS, CLINICALLY EMPYEMA"Chest tube remains in place,CT surgery  O/b.  Repeat chest x-ray no change, effusion is improved, lung remains trapped and no pneumothorax.  Transitioning to waterseal and repeat chest x-ray in the morning. Cont cefepime, discussed w ID Dr Prince Rome 4/22 re consult. AFB is neg from pleural fluid.  Clinically afebrile and leukocytosis resolved Recent Labs  Lab 06/16/18 0537 06/17/18 0452 06/18/18 0403 06/19/18 0620 06/20/18 0500  WBC 15.7* 11.4* 9.2 11.0* 10.5    Persistent atrial fibrillation : rate high normal.  Tsh nl, c/w Amiodarone, metoprolol per cardiology.  Blood pressure soft limiting medication.  back on warfarin, pharmacy dosing, INR 1.4. DCCV after INR therapeutic for 3 consecutive weeks.  Appreciate cardiology inputs. Recent Labs  Lab 06/16/18 1229 06/17/18 0452 06/18/18 0403 06/19/18 0620 06/20/18 0500  INR 1.9* 1.8* 1.6* 1.6* 1.4*   ESRD  On HD TTS w anemia of chronic disease/mineral bone disease: nephrology on board for dialysis. Appreciate inputs.Hypotension has limited UF with HD in the past as well as tachycardia in HD.  Hyponatremia improved with dialysis.  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  Leukocytosis: In the setting of empyema, overall improving and stable. Recent Labs  Lab 06/16/18 0537 06/17/18 0452 06/18/18 0403 06/19/18 0620 06/20/18 0500  WBC 15.7* 11.4* 9.2 11.0* 10.5   Hallucination/Intermittent confusion as per  the husband: Provide supportive care. She is off of Lamictal and Flagyl.  Type 2 DM, hba1c 5.9 4/17, well controlled, monitor CBG  RLE wound  followed by wound care outpatient, POA, lateral 12cm x 1.5cm x 0.2cm; 90% black soft eschar-Medial 4cm x 2.0cm x 0.2cm; 50% black soft eschar. C/w wound care here.  Stage  II Sacral decubitus0.4cm x 0.2cm x 0.1cm; POA.  Scabbed without drainage.  Cont wound care  HTN: Blood pressure soft/hypotension. On metoprolol c/w with holding parameters. Cautions w ivf given chf, HD and difficulty with ultra filtration in dialysis due to hypotension. But watch cautiously and use small aliquot ivf if symptomatic  Hyperlipidemia on lipitor  Pulmonary nodule/mild right paratracheal lymphadenopathy 8 mm LLL pulmonary nodule seen on CT chest 8 follow-up in 3 to 6 months with CT chest.  Pleural fluid is negative for malignancy.  Recurrent fal/hx of right hip fractures, comminuted left proximal humerus fracture: status post IM nail of hip 6 months ago, left shoulder managed conservatively, continue pain control, PT OT  DVT prophylaxis: SCD/ warfarin Code Status: DNR Family Communication: Discussed plan of care with the patient in detail AND she verbalized understanding.  Updated husband over the phone and all the questions were answered 4/23. Disposition Plan: remains inpatient pending clinical improvement.   Consultants:   [facility]  IR  Nephrology  Cardiology   Procedures:  HD on 4/16  IR thoracocentesis on 4/17  Chest tube placement on 4/20  VATS 4/22  Antimicrobials: Anti-infectives (From admission, onward)   Start     Dose/Rate Route Frequency Ordered Stop   06/18/18 0913  vancomycin (VANCOCIN) powder  Status:  Discontinued       As needed 06/18/18 0914 06/18/18 0958   06/18/18 0600  vancomycin (VANCOCIN) IVPB 1000 mg/200 mL premix     1,000 mg 200 mL/hr over 60 Minutes Intravenous On call to O.R. 06/17/18 1443 06/18/18 0908   06/17/18 1200  ceFEPIme (MAXIPIME) 2 g in sodium chloride 0.9 % 100 mL IVPB     2 g 200 mL/hr over 30 Minutes Intravenous Every T-Th-Sa (Hemodialysis)  06/16/18 1001     06/16/18 1100  ceFEPIme (MAXIPIME) 2 g in sodium chloride 0.9 % 100 mL IVPB     2 g 200 mL/hr over 30 Minutes Intravenous  Once 06/16/18 1001 06/16/18 1900   06/15/18 1130  metroNIDAZOLE (FLAGYL) IVPB 500 mg  Status:  Discontinued     500 mg 100 mL/hr over 60 Minutes Intravenous Every 8 hours 06/15/18 1033 06/16/18 1001   06/15/18 1100  Ampicillin-Sulbactam (UNASYN) 3 g in sodium chloride 0.9 % 100 mL IVPB  Status:  Discontinued     3 g 200 mL/hr over 30 Minutes Intravenous Every 12 hours 06/15/18 1051 06/16/18 1001   06/14/18 1600  cefTRIAXone (ROCEPHIN) 2 g in sodium chloride 0.9 % 100 mL IVPB  Status:  Discontinued     2 g 200 mL/hr over 30 Minutes Intravenous Every 24 hours 06/14/18 1508 06/15/18 1033       Objective: Vitals:   06/19/18 2308 06/20/18 0348 06/20/18 0628 06/20/18 0718  BP: 108/70 104/75  (!) 90/56  Pulse: (!) 121 (!) 121  (!) 107  Resp: (!) 22 19  20   Temp: (!) 97.5 F (36.4 C) (!) 97.5 F (36.4 C)  97.6 F (36.4 C)  TempSrc: Oral Oral  Oral  SpO2: 100% 99%  99%  Weight:   51.4 kg   Height:  Intake/Output Summary (Last 24 hours) at 06/20/2018 1040 Last data filed at 06/20/2018 0718 Gross per 24 hour  Intake 420 ml  Output 250 ml  Net 170 ml   Filed Weights   06/19/18 1200 06/19/18 1550 06/20/18 0628  Weight: 52 kg 52 kg 51.4 kg   Weight change: -0.9 kg  Body mass index is 19.45 kg/m.  Intake/Output from previous day: 04/23 0701 - 04/24 0700 In: 740 [P.O.:740] Out: 390 [Chest Tube:390] Intake/Output this shift: No intake/output data recorded.  Examination:  General exam: Alert awake, calm comfortable not in acute distress.   HEENT:PERRL,Oral mucosa moist, Ear/Nose normal on gross exam Respiratory system: Diminished breath sounds on right, with chest tube in place airway clear on the left side.  CT tube + Cardiovascular system: S1 & S2 heard,No JVD, murmurs. Gastrointestinal system: Abdomen soft NT ND BS + Nervous  System:Alert and oriented, and baseline, able to move extremities and nonfocal. Extremities: No edema, no clubbing, distal peripheral pulses palpable. Skin: No rashes, lesions, no icterus MSK: Normal muscle bulk,tone ,power  Medications:  Scheduled Meds: . amiodarone  200 mg Oral BID  . atorvastatin  20 mg Oral QHS  . bisacodyl  10 mg Oral Daily  . collagenase   Topical Daily  . enoxaparin (LOVENOX) injection  30 mg Subcutaneous Q24H  . feeding supplement (NEPRO CARB STEADY)  237 mL Oral BID BM  . Gerhardt's butt cream   Topical QID  . insulin aspart  0-24 Units Subcutaneous Q6H  . metoprolol tartrate  25 mg Oral BID  . multivitamin  1 tablet Oral QHS  . senna-docusate  1 tablet Oral QHS  . Warfarin - Pharmacist Dosing Inpatient   Does not apply q1800   Continuous Infusions: . ceFEPime (MAXIPIME) IV 2 g (06/19/18 1520)  . potassium chloride      Data Reviewed: I have personally reviewed following labs and imaging studies  CBC: Recent Labs  Lab 06/16/18 0537 06/17/18 0452 06/18/18 0403 06/19/18 0620 06/20/18 0500  WBC 15.7* 11.4* 9.2 11.0* 10.5  HGB 10.0* 10.7* 9.7* 9.1* 8.9*  HCT 32.8* 35.4* 30.8* 29.9* 29.6*  MCV 87.7 84.5 83.5 86.4 87.3  PLT 139* 130* 112* 130* 366*   Basic Metabolic Panel: Recent Labs  Lab 06/14/18 0736  06/16/18 0537 06/17/18 0452 06/17/18 2121 06/18/18 0403 06/19/18 0620 06/20/18 0500  NA 136   < > 138  --  137 138 135 135  K 4.2   < > 4.4  --  4.6 4.2 4.9 3.8  CL 95*   < > 96*  --  98 98 99 95*  CO2 24   < > 23  --  25 25 24 27   GLUCOSE 105*   < > 120*  --  118* 102* 137* 108*  BUN 36*   < > 26*  --  14 16 25* 15  CREATININE 4.81*   < > 3.91*  --  2.53* 2.82* 3.55* 2.54*  CALCIUM 8.1*   < > 8.0*  --  7.9* 8.1* 7.8* 7.9*  MG 2.0   < > 2.0 2.2  --  1.8 1.9 1.9  PHOS 4.1  --  4.1  --  2.5 2.9  --  2.9   < > = values in this interval not displayed.   GFR: Estimated Creatinine Clearance: 15.8 mL/min (A) (by C-G formula based on SCr of  2.54 mg/dL (H)). Liver Function Tests: Recent Labs  Lab 06/13/18 2041 06/14/18 0736 06/16/18 0537 06/17/18 2121 06/18/18  0403 06/20/18 0500  AST 64*  --   --   --   --  20  ALT 31  --   --   --   --  9  ALKPHOS 73  --   --   --   --  65  BILITOT 0.7  --   --   --   --  0.7  PROT 6.2*  --   --   --   --  5.3*  ALBUMIN 1.7* 1.5* 1.6* 1.5* 1.6* 1.6*   No results for input(s): LIPASE, AMYLASE in the last 168 hours. No results for input(s): AMMONIA in the last 168 hours. Coagulation Profile: Recent Labs  Lab 06/16/18 1229 06/17/18 0452 06/18/18 0403 06/19/18 0620 06/20/18 0500  INR 1.9* 1.8* 1.6* 1.6* 1.4*   Cardiac Enzymes: No results for input(s): CKTOTAL, CKMB, CKMBINDEX, TROPONINI in the last 168 hours. BNP (last 3 results) No results for input(s): PROBNP in the last 8760 hours. HbA1C: No results for input(s): HGBA1C in the last 72 hours. CBG: Recent Labs  Lab 06/19/18 0330 06/19/18 0612 06/19/18 1716 06/19/18 2307 06/20/18 0534  GLUCAP 118* 125* 90 91 103*   Lipid Profile: No results for input(s): CHOL, HDL, LDLCALC, TRIG, CHOLHDL, LDLDIRECT in the last 72 hours. Thyroid Function Tests: No results for input(s): TSH, T4TOTAL, FREET4, T3FREE, THYROIDAB in the last 72 hours. Anemia Panel: No results for input(s): VITAMINB12, FOLATE, FERRITIN, TIBC, IRON, RETICCTPCT in the last 72 hours. Sepsis Labs: No results for input(s): PROCALCITON, LATICACIDVEN in the last 168 hours.  Recent Results (from the past 240 hour(s))  MRSA PCR Screening     Status: None   Collection Time: 06/11/18  6:23 AM  Result Value Ref Range Status   MRSA by PCR NEGATIVE NEGATIVE Final    Comment:        The GeneXpert MRSA Assay (FDA approved for NASAL specimens only), is one component of a comprehensive MRSA colonization surveillance program. It is not intended to diagnose MRSA infection nor to guide or monitor treatment for MRSA infections. Performed at [facility],  [place] [address], [place], [place] [address]   Gram stain     Status: None   Collection Time: 06/13/18  5:04 PM  Result Value Ref Range Status   Specimen Description FLUID RIGHT PLEURAL  Final   Special Requests NONE  Final   Gram Stain   Final    ABUNDANT WBC PRESENT,BOTH PMN AND MONONUCLEAR NO ORGANISMS SEEN Performed at [facility], [address], [place], [place] [address]    Report Status 06/13/2018 FINAL  Final  Acid Fast Smear (AFB)     Status: None   Collection Time: 06/13/18  5:04 PM  Result Value Ref Range Status   AFB Specimen Processing Concentration  Final   Acid Fast Smear Negative  Final    Comment: (NOTE) Performed At: [facility] [place], [place] [id number] Rush Farmer MD HT:[id number]    Source (AFB) FLUID  Final    Comment: PLEURAL RIGHT Performed at [facility], [place] [address], [place], [place] [address]   Culture, body fluid-bottle     Status: Abnormal   Collection Time: 06/13/18  5:04 PM  Result Value Ref Range Status   Specimen Description FLUID RIGHT PLEURAL  Final   Special Requests BOTTLES DRAWN AEROBIC AND ANAEROBIC  Final   Gram Stain   Final    GRAM NEGATIVE RODS AEROBIC BOTTLE ONLY CRITICAL  RESULT CALLED TO, READ BACK BY AND VERIFIED WITH: E. MONGE, RN CONCERNING GRAM STAIN AT 2376 ON 06/14/18 BY C. JESSUP, MLT Performed at [facility], [place] [address] [place], [place] [address]    Culture PSEUDOMONAS AERUGINOSA (A)  Final   Report Status 06/17/2018 FINAL  Final   Organism ID, Bacteria PSEUDOMONAS AERUGINOSA  Final      Susceptibility   Pseudomonas aeruginosa - MIC*    CEFTAZIDIME 4 SENSITIVE Sensitive     CIPROFLOXACIN <=0.25 SENSITIVE Sensitive     GENTAMICIN <=1 SENSITIVE Sensitive     IMIPENEM 2 SENSITIVE Sensitive     PIP/TAZO 16 SENSITIVE Sensitive     CEFEPIME <=1 SENSITIVE Sensitive     * PSEUDOMONAS AERUGINOSA  Surgical pcr screen     Status: None   Collection Time: 06/17/18  9:47 PM   Result Value Ref Range Status   MRSA, PCR NEGATIVE NEGATIVE Final   Staphylococcus aureus NEGATIVE NEGATIVE Final    Comment: (NOTE) The Xpert SA Assay (FDA approved for NASAL specimens in patients 28 years of age and older), is one component of a comprehensive surveillance program. It is not intended to diagnose infection nor to guide or monitor treatment. Performed at [facility], [place] [address], [place], [place] [address]       Radiology Studies: Dg Chest [place] 1 View  Result Date: 06/20/2018 CLINICAL DATA:  73 year old female with loculated right pleural effusion, right chest empyema, right chest tube. EXAM: PORTABLE CHEST 1 VIEW COMPARISON:  06/19/2018 and earlier. FINDINGS: Portable AP semi upright view at 0615 hours. Stable right chest tube and right IJ central line. Residual right lung loculated fluid or pleural thickening is stable. No pneumothorax. Patchy right perihilar opacity is stable. Stable cardiac size and mediastinal contours. Stable left lung with small pleural effusion suspected. Stable visible IMPRESSION: 1. Stable right chest tube and right IJ central line. 2. No pneumothorax small volume residual loculated right pleural fluid or pleural thickening. 3. Small left pleural effusion suspected. Electronically Signed   By: Genevie Ann M.D.   On: 06/20/2018 08:00   Dg Chest [place] 1 View  Result Date: 06/19/2018 CLINICAL DATA:  73 year old female with a right-sided pneumothorax EXAM: PORTABLE CHEST 1 VIEW COMPARISON:  Prior chest x-ray 06/18/2018 FINDINGS: Right-sided thoracostomy tube remains in good position. Right IJ central venous catheter with the tip at the superior cavoatrial junction no residual right-sided pneumothorax. There is persistent pleural thickening versus pleural effusion. Stable borderline cardiomegaly. Mediastinal contours remain unchanged. Atherosclerotic calcifications again noted in the transverse aorta. Small left pleural effusion with associated left  basilar atelectasis. Overall, slightly improved aeration of the right lung. Background bronchitic changes are stable. IMPRESSION: 1. Slightly improved aeration and volumes within the right lung. 2. Persistent patchy airspace opacity in the right lower lobe likely reflects underlying pneumonia or residual atelectasis. 3. Well-positioned right-sided thoracostomy tube without evidence of pneumothorax. 4. Persistent mild right pleural thickening versus residual pleural effusion. 5. Small layering left pleural effusion and associated left basilar atelectasis. 6. The tip of the right IJ central venous catheter overlies the cavoatrial junction in good position. Electronically Signed   By: Jacqulynn Cadet M.D.   On: 06/19/2018 07:47      LOS: 8 days   Time spent: More than 50% of that time was spent in counseling and/or coordination of care.  Antonieta Pert, MD [facility]  06/20/2018, 10:40 AM

## 2018-06-21 ENCOUNTER — Inpatient Hospital Stay (HOSPITAL_COMMUNITY): Payer: Medicare Other

## 2018-06-21 DIAGNOSIS — I5042 Chronic combined systolic (congestive) and diastolic (congestive) heart failure: Secondary | ICD-10-CM

## 2018-06-21 LAB — RENAL FUNCTION PANEL
Albumin: 1.6 g/dL — ABNORMAL LOW (ref 3.5–5.0)
Anion gap: 10 (ref 5–15)
BUN: 23 mg/dL (ref 8–23)
CO2: 25 mmol/L (ref 22–32)
Calcium: 7.5 mg/dL — ABNORMAL LOW (ref 8.9–10.3)
Chloride: 99 mmol/L (ref 98–111)
Creatinine, Ser: 3.37 mg/dL — ABNORMAL HIGH (ref 0.44–1.00)
GFR calc Af Amer: 15 mL/min — ABNORMAL LOW (ref >60)
GFR calc non Af Amer: 13 mL/min — ABNORMAL LOW (ref >60)
Glucose, Bld: 124 mg/dL — ABNORMAL HIGH (ref 70–99)
Phosphorus: 3.6 mg/dL (ref 2.5–4.6)
Potassium: 3.9 mmol/L (ref 3.5–5.1)
Sodium: 134 mmol/L — ABNORMAL LOW (ref 135–145)

## 2018-06-21 LAB — COMPREHENSIVE METABOLIC PANEL
ALT: 9 U/L (ref 0–44)
AST: 20 U/L (ref 15–41)
Albumin: 1.6 g/dL — ABNORMAL LOW (ref 3.5–5.0)
Alkaline Phosphatase: 65 U/L (ref 38–126)
Anion gap: 13 (ref 5–15)
BUN: 15 mg/dL (ref 8–23)
CO2: 27 mmol/L (ref 22–32)
Calcium: 7.9 mg/dL — ABNORMAL LOW (ref 8.9–10.3)
Chloride: 95 mmol/L — ABNORMAL LOW (ref 98–111)
Creatinine, Ser: 2.54 mg/dL — ABNORMAL HIGH (ref 0.44–1.00)
GFR calc Af Amer: 21 mL/min — ABNORMAL LOW (ref >60)
GFR calc non Af Amer: 18 mL/min — ABNORMAL LOW (ref >60)
Glucose, Bld: 108 mg/dL — ABNORMAL HIGH (ref 70–99)
Potassium: 3.8 mmol/L (ref 3.5–5.1)
Sodium: 135 mmol/L (ref 135–145)
Total Bilirubin: 0.7 mg/dL (ref 0.3–1.2)
Total Protein: 5.3 g/dL — ABNORMAL LOW (ref 6.5–8.1)

## 2018-06-21 LAB — CBC
HCT: 28 % — ABNORMAL LOW (ref 36.0–46.0)
Hemoglobin: 8.5 g/dL — ABNORMAL LOW (ref 12.0–15.0)
MCH: 26.4 pg (ref 26.0–34.0)
MCHC: 30.4 g/dL (ref 30.0–36.0)
MCV: 87 fL (ref 80.0–100.0)
Platelets: 114 10*3/uL — ABNORMAL LOW (ref 150–400)
RBC: 3.22 MIL/uL — ABNORMAL LOW (ref 3.87–5.11)
RDW: 17.4 % — ABNORMAL HIGH (ref 11.5–15.5)
WBC: 7.4 10*3/uL (ref 4.0–10.5)
nRBC: 0 % (ref 0.0–0.2)

## 2018-06-21 LAB — GLUCOSE, CAPILLARY
Glucose-Capillary: 134 mg/dL — ABNORMAL HIGH (ref 70–99)
Glucose-Capillary: 91 mg/dL (ref 70–99)
Glucose-Capillary: 135 mg/dL — ABNORMAL HIGH (ref 70–99)

## 2018-06-21 LAB — MAGNESIUM: Magnesium: 2.1 mg/dL (ref 1.7–2.4)

## 2018-06-21 LAB — PROTIME-INR
INR: 1.4 — ABNORMAL HIGH (ref 0.8–1.2)
Prothrombin Time: 17.2 seconds — ABNORMAL HIGH (ref 11.4–15.2)

## 2018-06-21 MED ORDER — SODIUM CHLORIDE 0.9 % IV SOLN
2.0000 g | INTRAVENOUS | Status: DC
  Administered 2018-06-21: 2 g via INTRAVENOUS
  Filled 2018-06-21: qty 2

## 2018-06-21 MED ORDER — INSULIN ASPART 100 UNIT/ML ~~LOC~~ SOLN
0.0000 [IU] | Freq: Three times a day (TID) | SUBCUTANEOUS | Status: DC
  Administered 2018-06-22: 2 [IU] via SUBCUTANEOUS
  Administered 2018-06-22: 4 [IU] via SUBCUTANEOUS
  Administered 2018-06-23: 2 [IU] via SUBCUTANEOUS
  Administered 2018-06-23: 4 [IU] via SUBCUTANEOUS
  Administered 2018-06-24: 8 [IU] via SUBCUTANEOUS
  Administered 2018-06-24: 2 [IU] via SUBCUTANEOUS

## 2018-06-21 MED ORDER — BISACODYL 5 MG PO TBEC: 10.0000 mg | DELAYED_RELEASE_TABLET | Freq: Every day | ORAL | Status: DC | PRN

## 2018-06-21 MED ORDER — WARFARIN SODIUM 4 MG PO TABS
4.0000 mg | ORAL_TABLET | Freq: Once | ORAL | Status: AC
  Administered 2018-06-21: 4 mg via ORAL
  Filled 2018-06-21: qty 1

## 2018-06-21 MED ORDER — SENNOSIDES-DOCUSATE SODIUM 8.6-50 MG PO TABS: 1.0000 | ORAL_TABLET | Freq: Every evening | ORAL | Status: DC | PRN

## 2018-06-21 MED ORDER — DARBEPOETIN ALFA 40 MCG/0.4ML IJ SOSY: 40.0000 ug | PREFILLED_SYRINGE | INTRAMUSCULAR | Status: DC

## 2018-06-21 MED ORDER — ALBUMIN HUMAN 25 % IV SOLN
INTRAVENOUS | Status: AC
  Administered 2018-06-21: 25 g
  Filled 2018-06-21: qty 100

## 2018-06-21 MED ORDER — INSULIN ASPART 100 UNIT/ML ~~LOC~~ SOLN: 0.0000 [IU] | Freq: Four times a day (QID) | SUBCUTANEOUS | Status: DC

## 2018-06-21 NOTE — Progress Notes (Signed)
[place] for warfarin Indication: atrial fibrillation  Allergies  Allergen Reactions  . Sulfa Antibiotics Nausea Only    Patient Measurements: Height: 5\' 4"  (162.6 cm) Weight: 117 lb 1 oz (53.1 kg) IBW/kg (Calculated) : 54.7  Vital Signs: Temp: 97.8 F (36.6 C) (04/25 0733) Temp Source: Oral (04/25 0733) BP: 96/49 (04/25 0900) Pulse Rate: 117 (04/25 0900)  Labs: Recent Labs    06/19/18 0620 06/20/18 0500 06/21/18 0613 06/21/18 0812  HGB 9.1* 8.9*  --  8.5*  HCT 29.9* 29.6*  --  28.0*  PLT 130* 131*  --  114*  LABPROT 18.9* 17.2* 17.2*  --   INR 1.6* 1.4* 1.4*  --   CREATININE 3.55* 2.54*  --  3.37*    Estimated Creatinine Clearance: 12.3 mL/min (A) (by C-G formula based on SCr of 3.37 mg/dL (H)).   Medical History: Past Medical History:  Diagnosis Date  . Anemia    takes Folic Acid;pt states she gets an injection every 2wks   . Chronic combined systolic and diastolic congestive heart failure ([place])   . Closed right hip fracture, initial encounter ([place]) 10/19/2017  . Depression    but doesn't take any meds for it  . Diabetes mellitus    takes Lantus and Novolog 70/30;fasting blood sugar 200;Type 2  . GERD (gastroesophageal reflux disease)    takes Omeprazole daily  . GI bleeding   . History of bladder infections    last one a month ago;saw Dr.Wrenn and cysto was done in office;was on antibiotics and completed 2wks ago  . History of blood transfusion    no blood transfusion  . HLD (hyperlipidemia) 10/19/2017  . HTN (hypertension) 10/19/2017  . Hyperlipidemia    takes Zocor daily  . Hypertension    takes Coreg daily  . Mitral regurgitation   . Myocardial infarction ([place]) 2010  . PAF (paroxysmal atrial fibrillation) ([place])   . Pancreatitis   . Peripheral neuropathy   . Pleural effusion, bilateral   . 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 UTI (lower urinary tract infection)     Assessment: 73 year old female s/p VATS  4/22. Patient with history of afib on warfarin prior to admission. Pharmacy has been consulted for warfarin dosing.   PTA regimen is 2.5 mg daily except 1.25 mg on Saturday. Admit INR was 4.  INR unchanged from yesterday, remains subtherapeutic at 1.4 - warfarin restarted on 4/23. Of note, ABLA noted, and pt was given Vitamin K IV 10 mg on 4/19 and 4/20, with an additional 5 mg IV x1 4/20. Additionally, pt has been on amiodarone since 4/15, which can increase warfarin concentrations and therefore INR. Pt is also on Lovenox for DVT ppx while INR is subtherapeutic. CBC slightly down from yesterday.  Goal of Therapy:  INR 2-3 Monitor platelets by anticoagulation protocol: Yes   Plan:  Warfarin 4 mg x1 tonight Daily INR  Jackson Latino, PharmD PGY1 Pharmacy Resident Phone [phone] 06/21/2018     9:37 AM

## 2018-06-21 NOTE — Procedures (Signed)
Seen and examined on dialysis at 8:50 am.  Blood pressure 79/47 and HR 103.  Her UF was turned off.  Albumin 25 grams IV once now   Right AVF running well   Claudia Desanctis, MD 06/21/2018

## 2018-06-21 NOTE — Progress Notes (Signed)
Patient ID: Vanessa Webb, female   DOB: 1945/10/02, 73 y.o.   MRN: [id number] [facility] Progress Note   Assessment/ Plan:   1.  Atrial fibrillation with rapid ventricular response:  per cardiology; metoprolol and amio.  For future elective cardioversion per cardiology.  Note that hypotension has limited UF with HD in the past and HR running 100-130's with HD.  2.  End-stage renal disease: HD is per TTS schedule - HD today  - Normally at [facility].   - Note previous dry weight appears to be 53 kg on outpatient records.  Anticipate adjustment down needed given her acute illness and possible weight loss  3. Pleural effusion with empyema - s/p VATS.  With chest tube   4.  Hyponatremia: improved with HD/UF, advised to limit intake.   5. 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  aranesp given 4/24   5.  Secondary hyperparathyroidism: We will follow calcium/phosphorus levels. Stable  6.  Protein calorie malnutrition: Significant hypoalbuminemia noted most possibly arising from nonhealing leg ulcer/acute phase reactant.  Continue renal vitamin/ONS.  7.  Hallucinations: off of Lomotil and metronidazole   She was screened for rehab consult which was recommended per rehab admissions coordinator  Subjective:   Last HD on 4/23 with no UF (as she was below her dry weight)  Dicussed plans for HD today.  She has not been cramping with dialysis  Review of systems:  No shortness of breath or chest pain No nausea or vomiting   Objective:   BP 113/80 (BP Location: Left Arm)   Pulse 65   Temp (!) 97.5 F (36.4 C) (Oral)   Resp 16   Ht 5\' 4"  (1.626 m)   Wt 53.1 kg   SpO2 100%   BMI 20.09 kg/m   Physical Exam: Gen: adult female in bed in NAD  CVS: irregularly irregular; tachycardia to 100; no rub Resp: right chest tube; right lung reduced breath sounds; left lung clear; unlabored Abd: Soft, flat, nontender Ext: no lower extremity edema; right arm less swollen Access: right AVF  with bruit and thrill   Labs: BMET Recent Labs  Lab 06/14/18 0736 06/15/18 0406 06/16/18 0537 06/17/18 2121 06/18/18 0403 06/19/18 0620 06/20/18 0500  NA 136 138 138 137 138 135 135  K 4.2 3.6 4.4 4.6 4.2 4.9 3.8  CL 95* 96* 96* 98 98 99 95*  CO2 24 27 23 25 25 24 27   GLUCOSE 105* 95 120* 118* 102* 137* 108*  BUN 36* 19 26* 14 16 25* 15  CREATININE 4.81* 3.00* 3.91* 2.53* 2.82* 3.55* 2.54*  CALCIUM 8.1* 7.9* 8.0* 7.9* 8.1* 7.8* 7.9*  PHOS 4.1  --  4.1 2.5 2.9  --  2.9   CBC Recent Labs  Lab 06/17/18 0452 06/18/18 0403 06/19/18 0620 06/20/18 0500  WBC 11.4* 9.2 11.0* 10.5  HGB 10.7* 9.7* 9.1* 8.9*  HCT 35.4* 30.8* 29.9* 29.6*  MCV 84.5 83.5 86.4 87.3  PLT 130* 112* 130* 131*   Medications:    . amiodarone  200 mg Oral BID  . atorvastatin  20 mg Oral QHS  . bisacodyl  10 mg Oral Daily  . Chlorhexidine Gluconate Cloth  6 each Topical Q0600  . collagenase   Topical Daily  . enoxaparin (LOVENOX) injection  30 mg Subcutaneous Q24H  . feeding supplement (NEPRO CARB STEADY)  237 mL Oral BID BM  . Gerhardt's butt cream   Topical QID  . insulin aspart  0-24 Units Subcutaneous Q6H  .  metoprolol tartrate  25 mg Oral BID  . multivitamin  1 tablet Oral QHS  . senna-docusate  1 tablet Oral QHS  . Warfarin - Pharmacist Dosing Inpatient   Does not apply q1800    Claudia Desanctis 06/21/2018

## 2018-06-21 NOTE — Progress Notes (Signed)
PROGRESS NOTE    Vanessa Webb  UXN:[id number] DOB: Nov 08, 1945 DOA: 06/11/2018 PCP: Monico Blitz, MD   Brief Narrative: 73 year old female with history of ESRD on HD TTS, hypertension, hyperlipidemia, A. fib on Coumadin for anticoagulation and chronic right leg ulcer transferred from [facility] for hemodialysis. Patient presented to [facility] with 2 weeks of generalized weakness and found to be in A. fib with RVR. She was placed on diltiazem drip transition to amiodarone prior to transfer. Patient is dialysis on 4/14 due to weakness. Reports good compliance with her metoprolol.  Of note, patient has had poor appetite, generalized weakness and about 40 pound weight loss over the last 1 year. She fell and broke her right hip 6 months ago and underwent IM nailing. She also fell and broke her left shoulder 3 months ago on the way to dialysis that has been treated conservatively.  Patient remained in A. fib with RVR on amiodarone drip. Initially, plan for DCCV if heart rate is not under good control. This has changed after talking to CTS about the pleural effusion and noted to have Large right-sided pleural effusion  on chest x-ray and CT chest. These appear to be chronic. She had US-guided diagnostic and therapeutic right thoracocentesis on 4/17 that yielded 300 cc exudative fluid by LDH. Pleural fluid Gram stain and culture with GNR. AFB smear negative. AFB culture and cytology pending. Started on Unasyn and Flagyl. Cardiothoracic surgery consulted on 4/19 recommended stopping warfarin, INR reversal and chest tube placement by IR. Pleural fluid culture with Pseudomonas aeruginosa and transitioned to cefepime on 06/16/2018. 4/20: IR chest tube placement and Gram stain negative, cytology consistent with acute inflammation. 4/22: VATS.  Subjective: Seen this morning in dialysis unit.  Resting well denies any nausea or vomiting he is alert awake oriented.  Blood pressure was low needing albumin.    Assessment & Plan:  Empyema right chest with Pseudomonas , Community-acquired likely from pneumonia untreated. 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 Pathology shows "ACUTE FIBRINOUS PLEURITIS, CLINICALLY EMPYEMA"Chest tube remains in place,CT surgery  O/b.  Repeat chest x-ray no change, effusion is improved, lung remains trapped and no pneumothorax.  Transitioning to waterseal and repeat chest x-ray in the morning. Cont cefepime, discussed w ID Dr Prince Rome:  4/22 and re consult- Advised 4 weeks of antibiotics from the time of her decortication,  tentative ANTIBIOTIC D/C date 07/13/18.  Clinically improving leukocytosis resolved  Recent Labs  Lab 06/17/18 0452 06/18/18 0403 06/19/18 0620 06/20/18 0500 06/21/18 0812  WBC 11.4* 9.2 11.0* 10.5 7.4    Persistent atrial fibrillation : rate high normal up to 110s. Tsh nl, c/w Amiodarone, metoprolol per cardiology.  Blood pressure soft limiting medication.  back on warfarin, pharmacy dosing, INR 1.4. DCCV after INR therapeutic for 3 consecutive weeks vs TEE guided cardioversion here, given patient's hypotension and difficulty with ultrafiltration and dialysis as not able to remove any fluids, may favor doing cardioversion after INR is therapeutic. Appreciate cardiology inputs. Recent Labs  Lab 06/17/18 0452 06/18/18 0403 06/19/18 0620 06/20/18 0500 06/21/18 0613  INR 1.8* 1.6* 1.6* 1.4* 1.4*   ESRD  On HD TTS w anemia of chronic disease/mineral bone disease: nephrology on board for dialysis. HD today- bp was in 70s-albumin given, again difficulty with ultrafiltration due to hypotension A. fib tachycardia.  Hyponatremia improved with dialysis.  Protein calorie malnutrition moderate, with hypoalbuminemia, augment nutrition  Acute on chronic combined CHF  with EF 40 to 45% down from 55 to 60% in January 2018.Fluid being managed with dialysis but not being  able to be removed.  Leukocytosis: In the setting of empyema, overall improving and stable. Recent Labs  Lab 06/17/18 0452 06/18/18 0403 06/19/18 0620 06/20/18 0500 06/21/18 0812  WBC 11.4* 9.2 11.0* 10.5 7.4   Hallucination/Intermittent confusion as per the husband: Provide supportive care. She is off of Lamictal and Flagyl. Mental status appears to stable.  Type 2 DM, hba1c 5.9 4/17, well controlled, monitor CBG.  RLE wound followed by wound care outpatient, POA,  12cm x 1.5cm x 0.2cm; 90% black soft eschar-Medial 4cm x 2.0cm x 0.2cm; 50% black soft eschar. C/w wound care   Stage  II Sacral decubitus0.4cm x 0.2cm x 0.1cm; POA.  Scabbed without drainage.  Cont wound care  HTN: Blood pressure soft/hypotension. On metoprolol c/w with holding parameters. Cautions w ivf given chf, HD and difficulty with ultra filtration in dialysis due to hypotension.  Hyperlipidemia on lipitor  Pulmonary nodule/mild right paratracheal lymphadenopathy 8 mm LLL pulmonary nodule seen on CT chest 8 follow-up in 3 to 6 months with CT chest.  Pleural fluid is negative for malignancy.  Recurrent fal/hx of right hip fractures, comminuted left proximal humerus fracture: status post IM nail of hip 6 months ago, left shoulder managed conservatively, continue pain control, PT OT  DVT prophylaxis: SCD/ warfarin Code Status: DNR Family Communication: Discussed plan of care with the patient in detail AND she verbalized understanding.  hAD Updated husband over the phone previously.   Disposition Plan: remains inpatient pending clinical improvement.CIR consulted. Cont pt/ot  Consultants:   CT Surgery  IR  Nephrology  Cardiology   Procedures:  HD on 4/16  IR thoracocentesis on 4/17  Chest tube placement on 4/20  VATS 4/22 Central line 4/22- will d/c .  Antimicrobials: Anti-infectives (From admission, onward)   Start     Dose/Rate Route Frequency Ordered Stop   06/21/18 1800  ceFEPIme (MAXIPIME)  2 g in sodium chloride 0.9 % 100 mL IVPB     2 g 200 mL/hr over 30 Minutes Intravenous Every T-Th-Sa (1800) 06/21/18 0927     06/18/18 0913  vancomycin (VANCOCIN) powder  Status:  Discontinued       As needed 06/18/18 0914 06/18/18 0958   06/18/18 0600  vancomycin (VANCOCIN) IVPB 1000 mg/200 mL premix     1,000 mg 200 mL/hr over 60 Minutes Intravenous On call to O.R. 06/17/18 1443 06/18/18 0908   06/17/18 1200  ceFEPIme (MAXIPIME) 2 g in sodium chloride 0.9 % 100 mL IVPB  Status:  Discontinued     2 g 200 mL/hr over 30 Minutes Intravenous Every T-Th-Sa (Hemodialysis) 06/16/18 1001 06/21/18 0927   06/16/18 1100  ceFEPIme (MAXIPIME) 2 g in sodium chloride 0.9 % 100 mL IVPB     2 g 200 mL/hr over 30 Minutes Intravenous  Once 06/16/18 1001 06/16/18 1900   06/15/18 1130  metroNIDAZOLE (FLAGYL) IVPB 500 mg  Status:  Discontinued     500 mg 100 mL/hr over 60 Minutes Intravenous Every 8 hours 06/15/18 1033 06/16/18 1001   06/15/18 1100  Ampicillin-Sulbactam (UNASYN) 3 g in sodium chloride 0.9 % 100 mL IVPB  Status:  Discontinued     3 g 200 mL/hr over 30 Minutes Intravenous Every 12 hours 06/15/18 1051 06/16/18 1001   06/14/18 1600  cefTRIAXone (ROCEPHIN) 2 g in sodium chloride 0.9 % 100 mL IVPB  Status:  Discontinued  2 g 200 mL/hr over 30 Minutes Intravenous Every 24 hours 06/14/18 1508 06/15/18 1033       Objective: Vitals:   06/21/18 1030 06/21/18 1100 06/21/18 1130 06/21/18 1151  BP: (!) 113/44 133/73 (!) 91/53 112/61  Pulse: (!) 104 (!) 126 88 (!) 103  Resp: 16 16 17 18   Temp:    (!) 96.9 F (36.1 C)  TempSrc:    Axillary  SpO2:    96%  Weight:    52.5 kg  Height:        Intake/Output Summary (Last 24 hours) at 06/21/2018 1231 Last data filed at 06/21/2018 1151 Gross per 24 hour  Intake 480 ml  Output 933 ml  Net -453 ml   Filed Weights   06/21/18 0323 06/21/18 0733 06/21/18 1151  Weight: 53.1 kg 53.1 kg 52.5 kg   Weight change: 1.1 kg  Body mass index is 19.87  kg/m.  Intake/Output from previous day: 04/24 0701 - 04/25 0700 In: 480 [P.O.:480] Out: 100 [Chest Tube:100] Intake/Output this shift: Total I/O In: 0  Out: 833 [Other:833]  Examination: General exam: Calm, comfortable, not in acute distress, older for age, HEENT:Oral mucosa moist, Ear/Nose WNL grossly, dentition normal. Respiratory system: Bilateral air entry present with no crackles. non tender on palpation. Cardiovascular system: regular rate and rhythm, S1 & S2 heard, No JVD/murmurs. Gastrointestinal system: Abdomen soft, non-tender, non-distended, BS +. No hepatosplenomegaly palpable. Nervous System:Alert, awake and oriented at baseline. Able to move UE and LE, sensation intact. Extremities: No edema, distal peripheral pulses palpable.  Skin: No rashes,no icterus. MSK: Normal muscle bulk,tone, power Rt IJ+ Chest tube present  Medications:  Scheduled Meds: . amiodarone  200 mg Oral BID  . atorvastatin  20 mg Oral QHS  . bisacodyl  10 mg Oral Daily  . Chlorhexidine Gluconate Cloth  6 each Topical Q0600  . collagenase   Topical Daily  . [START ON 06/28/2018] darbepoetin (ARANESP) injection - DIALYSIS  40 mcg Intravenous Q Sat-HD  . enoxaparin (LOVENOX) injection  30 mg Subcutaneous Q24H  . feeding supplement (NEPRO CARB STEADY)  237 mL Oral BID BM  . Gerhardt's butt cream   Topical QID  . insulin aspart  0-24 Units Subcutaneous Q6H  . metoprolol tartrate  25 mg Oral BID  . multivitamin  1 tablet Oral QHS  . senna-docusate  1 tablet Oral QHS  . warfarin  4 mg Oral ONCE-1800  . Warfarin - Pharmacist Dosing Inpatient   Does not apply q1800   Continuous Infusions: . ceFEPime (MAXIPIME) IV    . potassium chloride      Data Reviewed: I have personally reviewed following labs and imaging studies  CBC: Recent Labs  Lab 06/17/18 0452 06/18/18 0403 06/19/18 0620 06/20/18 0500 06/21/18 0812  WBC 11.4* 9.2 11.0* 10.5 7.4  HGB 10.7* 9.7* 9.1* 8.9* 8.5*  HCT 35.4* 30.8*  29.9* 29.6* 28.0*  MCV 84.5 83.5 86.4 87.3 87.0  PLT 130* 112* 130* 131* 595*   Basic Metabolic Panel: Recent Labs  Lab 06/16/18 0537 06/17/18 0452 06/17/18 2121 06/18/18 0403 06/19/18 0620 06/20/18 0500 06/21/18 0613 06/21/18 0812  NA 138  --  137 138 135 135  --  134*  K 4.4  --  4.6 4.2 4.9 3.8  --  3.9  CL 96*  --  98 98 99 95*  --  99  CO2 23  --  25 25 24 27   --  25  GLUCOSE 120*  --  118* 102* 137*  108*  --  124*  BUN 26*  --  14 16 25* 15  --  23  CREATININE 3.91*  --  2.53* 2.82* 3.55* 2.54*  --  3.37*  CALCIUM 8.0*  --  7.9* 8.1* 7.8* 7.9*  --  7.5*  MG 2.0 2.2  --  1.8 1.9 1.9 2.1  --   PHOS 4.1  --  2.5 2.9  --  2.9  --  3.6   GFR: Estimated Creatinine Clearance: 12.1 mL/min (A) (by C-G formula based on SCr of 3.37 mg/dL (H)). Liver Function Tests: Recent Labs  Lab 06/16/18 0537 06/17/18 2121 06/18/18 0403 06/20/18 0500 06/21/18 0812  AST  --   --   --  20  --   ALT  --   --   --  9  --   ALKPHOS  --   --   --  65  --   BILITOT  --   --   --  0.7  --   PROT  --   --   --  5.3*  --   ALBUMIN 1.6* 1.5* 1.6* 1.6* 1.6*   No results for input(s): LIPASE, AMYLASE in the last 168 hours. No results for input(s): AMMONIA in the last 168 hours. Coagulation Profile: Recent Labs  Lab 06/17/18 0452 06/18/18 0403 06/19/18 0620 06/20/18 0500 06/21/18 0613  INR 1.8* 1.6* 1.6* 1.4* 1.4*   Cardiac Enzymes: No results for input(s): CKTOTAL, CKMB, CKMBINDEX, TROPONINI in the last 168 hours. BNP (last 3 results) No results for input(s): PROBNP in the last 8760 hours. HbA1C: No results for input(s): HGBA1C in the last 72 hours. CBG: Recent Labs  Lab 06/20/18 1128 06/20/18 1232 06/20/18 1616 06/20/18 2312 06/21/18 0540  GLUCAP 68* 98 114* 106* 134*   Lipid Profile: No results for input(s): CHOL, HDL, LDLCALC, TRIG, CHOLHDL, LDLDIRECT in the last 72 hours. Thyroid Function Tests: No results for input(s): TSH, T4TOTAL, FREET4, T3FREE, THYROIDAB in the last  72 hours. Anemia Panel: No results for input(s): VITAMINB12, FOLATE, FERRITIN, TIBC, IRON, RETICCTPCT in the last 72 hours. Sepsis Labs: No results for input(s): PROCALCITON, LATICACIDVEN in the last 168 hours.  Recent Results (from the past 240 hour(s))  Gram stain     Status: None   Collection Time: 06/13/18  5:04 PM  Result Value Ref Range Status   Specimen Description FLUID RIGHT PLEURAL  Final   Special Requests NONE  Final   Gram Stain   Final    ABUNDANT WBC PRESENT,BOTH PMN AND MONONUCLEAR NO ORGANISMS SEEN Performed at [facility], [address], [place], [place] [address]    Report Status 06/13/2018 FINAL  Final  Acid Fast Smear (AFB)     Status: None   Collection Time: 06/13/18  5:04 PM  Result Value Ref Range Status   AFB Specimen Processing Concentration  Final   Acid Fast Smear Negative  Final    Comment: (NOTE) Performed At: [facility] [place], [place] [id number] Rush Farmer MD JF:[id number]    Source (AFB) FLUID  Final    Comment: PLEURAL RIGHT Performed at [facility], [place] [address], [place], [place] [address]   Culture, body fluid-bottle     Status: Abnormal   Collection Time: 06/13/18  5:04 PM  Result Value Ref Range Status   Specimen Description FLUID RIGHT PLEURAL  Final   Special Requests BOTTLES DRAWN AEROBIC AND ANAEROBIC  Final   Gram Stain   Final  GRAM NEGATIVE RODS AEROBIC BOTTLE ONLY CRITICAL RESULT CALLED TO, READ BACK BY AND VERIFIED WITH: E. MONGE, RN CONCERNING GRAM STAIN AT 1007 ON 06/14/18 BY C. JESSUP, MLT Performed at [facility], [place] [address] [place], [place] [address]    Culture PSEUDOMONAS AERUGINOSA (A)  Final   Report Status 06/17/2018 FINAL  Final   Organism ID, Bacteria PSEUDOMONAS AERUGINOSA  Final      Susceptibility   Pseudomonas aeruginosa - MIC*    CEFTAZIDIME 4 SENSITIVE Sensitive     CIPROFLOXACIN <=0.25 SENSITIVE Sensitive     GENTAMICIN <=1 SENSITIVE Sensitive      IMIPENEM 2 SENSITIVE Sensitive     PIP/TAZO 16 SENSITIVE Sensitive     CEFEPIME <=1 SENSITIVE Sensitive     * PSEUDOMONAS AERUGINOSA  Surgical pcr screen     Status: None   Collection Time: 06/17/18  9:47 PM  Result Value Ref Range Status   MRSA, PCR NEGATIVE NEGATIVE Final   Staphylococcus aureus NEGATIVE NEGATIVE Final    Comment: (NOTE) The Xpert SA Assay (FDA approved for NASAL specimens in patients 59 years of age and older), is one component of a comprehensive surveillance program. It is not intended to diagnose infection nor to guide or monitor treatment. Performed at [facility], [place] [address], [place], [place] [address]       Radiology Studies: Dg Chest Port 1 View  Result Date: 06/21/2018 CLINICAL DATA:  Empyema pleural space. EXAM: PORTABLE CHEST 1 VIEW COMPARISON:  June 20, 2018 FINDINGS: The right central line is stable. The right chest tube/drain is stable. The loculated pleural fluid and associated thickening on the right are stable. Mild haziness in the mid right lung is more prominent. A small left effusion is seen. No other changes. IMPRESSION: 1. Support apparatus as above. 2. Stable loculated pleural effusion and associated pleural thickening on the right. 3. Opacity in the right mid to lower lung is a little more prominent and could represent atelectasis versus developing infiltrate. Recommend attention on follow-up. 4. Small left pleural effusion with underlying atelectasis. Electronically Signed   By: Dorise Bullion III M.D   On: 06/21/2018 06:49   Dg Chest Port 1 View  Result Date: 06/20/2018 CLINICAL DATA:  73 year old female with loculated right pleural effusion, right chest empyema, right chest tube. EXAM: PORTABLE CHEST 1 VIEW COMPARISON:  06/19/2018 and earlier. FINDINGS: Portable AP semi upright view at 0615 hours. Stable right chest tube and right IJ central line. Residual right lung loculated fluid or pleural thickening is stable. No  pneumothorax. Patchy right perihilar opacity is stable. Stable cardiac size and mediastinal contours. Stable left lung with small pleural effusion suspected. Stable visible IMPRESSION: 1. Stable right chest tube and right IJ central line. 2. No pneumothorax small volume residual loculated right pleural fluid or pleural thickening. 3. Small left pleural effusion suspected. Electronically Signed   By: Genevie Ann M.D.   On: 06/20/2018 08:00      LOS: 9 days   Time spent: More than 50% of that time was spent in counseling and/or coordination of care.  Antonieta Pert, MD [facility]  06/21/2018, 12:31 PM

## 2018-06-21 NOTE — Progress Notes (Signed)
3 Days Post-Op Procedure(s) (LRB): VIDEO ASSISTED THORACOSCOPY (VATS)/ DRAINAGE OF EMPYEMA (Right) Subjective: No complaints. Had HD earlier today  Objective: Vital signs in last 24 hours: Temp:  [96.9 F (36.1 C)-97.8 F (36.6 C)] 97.5 F (36.4 C) (04/25 1515) Pulse Rate:  [65-126] 93 (04/25 1515) Cardiac Rhythm: Atrial fibrillation (04/25 1300) Resp:  [16-28] 17 (04/25 1515) BP: (78-133)/(37-80) 103/68 (04/25 1515) SpO2:  [89 %-100 %] 98 % (04/25 1515) Weight:  [52.5 kg-53.1 kg] 52.5 kg (04/25 1151)  Hemodynamic parameters for last 24 hours:    Intake/Output from previous day: 04/24 0701 - 04/25 0700 In: 480 [P.O.:480] Out: 100 [Chest Tube:100] Intake/Output this shift: Total I/O In: 120 [P.O.:120] Out: 833 [Other:833]  General appearance: alert, cooperative and no distress Neurologic: intact Heart: irregularly irregular rhythm Lungs: diminished breath sounds bibasilar no air leak  Lab Results: Recent Labs    06/20/18 0500 06/21/18 0812  WBC 10.5 7.4  HGB 8.9* 8.5*  HCT 29.6* 28.0*  PLT 131* 114*   BMET:  Recent Labs    06/20/18 0500 06/21/18 0812  NA 135 134*  K 3.8 3.9  CL 95* 99  CO2 27 25  GLUCOSE 108* 124*  BUN 15 23  CREATININE 2.54* 3.37*  CALCIUM 7.9* 7.5*    PT/INR:  Recent Labs    06/21/18 0613  LABPROT 17.2*  INR 1.4*   ABG    Component Value Date/Time   PHART 7.427 06/19/2018 0350   HCO3 24.4 06/19/2018 0350   TCO2 27 12/03/2016 0751   ACIDBASEDEF 9.0 (H) 01/23/2009 0429   O2SAT 98.8 06/19/2018 0350   CBG (last 3)  Recent Labs    06/20/18 2312 06/21/18 0540 06/21/18 1615  GLUCAP 106* 134* 91    Assessment/Plan: S/P Procedure(s) (LRB): VIDEO ASSISTED THORACOSCOPY (VATS)/ DRAINAGE OF EMPYEMA (Right) -Cv remains in atrial fib, controlled rate On warfarin, INR= 1.4 Only 100 ml recorded from CT, CXR not significantly changed. Leave tube in place   LOS: 9 days    Melrose Nakayama 06/21/2018

## 2018-06-21 NOTE — Progress Notes (Signed)
Bernadette RN aware of order to d/c CVC.

## 2018-06-21 NOTE — Progress Notes (Signed)
OT Cancellation Note  Patient Details Name: Vanessa Webb MRN: [id number] DOB: 1945-12-02   Cancelled Treatment:    Reason Eval/Treat Not Completed: Patient at procedure or test/ unavailable(HD. Will return as schedule allows. Thank you.)  [place], OTR/L Acute Rehab Pager: [phone] Office: [phone] 06/21/2018, 8:59 AM

## 2018-06-21 NOTE — Progress Notes (Signed)
Progress Note  Patient Name: Vanessa Webb Date of Encounter: 06/21/2018  Primary Cardiologist: Vanessa Dolly, MD   Subjective   No CP or dyspnea, post HD just now.  Inpatient Medications    Scheduled Meds: . amiodarone  200 mg Oral BID  . atorvastatin  20 mg Oral QHS  . bisacodyl  10 mg Oral Daily  . Chlorhexidine Gluconate Cloth  6 each Topical Q0600  . collagenase   Topical Daily  . [START ON 06/28/2018] darbepoetin (ARANESP) injection - DIALYSIS  40 mcg Intravenous Q Sat-HD  . enoxaparin (LOVENOX) injection  30 mg Subcutaneous Q24H  . feeding supplement (NEPRO CARB STEADY)  237 mL Oral BID BM  . Gerhardt's butt cream   Topical QID  . insulin aspart  0-24 Units Subcutaneous Q6H  . metoprolol tartrate  25 mg Oral BID  . multivitamin  1 tablet Oral QHS  . senna-docusate  1 tablet Oral QHS  . warfarin  4 mg Oral ONCE-1800  . Warfarin - Pharmacist Dosing Inpatient   Does not apply q1800   Continuous Infusions: . ceFEPime (MAXIPIME) IV    . potassium chloride     PRN Meds: acetaminophen **OR** acetaminophen, acetaminophen, antiseptic oral rinse, loperamide, morphine injection, ondansetron (ZOFRAN) IV, potassium chloride, traMADol   Vital Signs    Vitals:   06/21/18 1030 06/21/18 1100 06/21/18 1130 06/21/18 1151  BP: (!) 113/44 133/73 (!) 91/53 112/61  Pulse: (!) 104 (!) 126 88 (!) 103  Resp: 16 16 17 18   Temp:      TempSrc:      SpO2:    96%  Weight:      Height:        Intake/Output Summary (Last 24 hours) at 06/21/2018 1159 Last data filed at 06/21/2018 1151 Gross per 24 hour  Intake 480 ml  Output 933 ml  Net -453 ml   Last 3 Weights 06/21/2018 06/21/2018 06/20/2018  Weight (lbs) 117 lb 1 oz 117 lb 1 oz 113 lb 5.1 oz  Weight (kg) 53.1 kg 53.1 kg 51.4 kg      Telemetry    Atrial fibrillation rate elevated- Personally Reviewed  Physical Exam   GEN: WD somewhat frail Neck: supple Cardiac: irregular, tachycardic Respiratory: Diminished BS RLL; CT in  place; no wheeze GI: Soft, NT/ND, no masses MS: No edema Neuro:  No focal findings  Labs    Chemistry Recent Labs  Lab 06/18/18 0403 06/19/18 0620 06/20/18 0500 06/21/18 0812  NA 138 135 135 134*  K 4.2 4.9 3.8 3.9  CL 98 99 95* 99  CO2 25 24 27 25   GLUCOSE 102* 137* 108* 124*  BUN 16 25* 15 23  CREATININE 2.82* 3.55* 2.54* 3.37*  CALCIUM 8.1* 7.8* 7.9* 7.5*  PROT  --   --  5.3*  --   ALBUMIN 1.6*  --  1.6* 1.6*  AST  --   --  20  --   ALT  --   --  9  --   ALKPHOS  --   --  65  --   BILITOT  --   --  0.7  --   GFRNONAA 16* 12* 18* 13*  GFRAA 18* 14* 21* 15*  ANIONGAP 15 12 13 10      Hematology Recent Labs  Lab 06/19/18 0620 06/20/18 0500 06/21/18 0812  WBC 11.0* 10.5 7.4  RBC 3.46* 3.39* 3.22*  HGB 9.1* 8.9* 8.5*  HCT 29.9* 29.6* 28.0*  MCV 86.4 87.3 87.0  MCH 26.3 26.3 26.4  MCHC 30.4 30.1 30.4  RDW 17.9* 17.7* 17.4*  PLT 130* 131* 114*      Radiology    Dg Chest Port 1 View  Result Date: 06/21/2018 CLINICAL DATA:  Empyema pleural space. EXAM: PORTABLE CHEST 1 VIEW COMPARISON:  June 20, 2018 FINDINGS: The right central line is stable. The right chest tube/drain is stable. The loculated pleural fluid and associated thickening on the right are stable. Mild haziness in the mid right lung is more prominent. A small left effusion is seen. No other changes. IMPRESSION: 1. Support apparatus as above. 2. Stable loculated pleural effusion and associated pleural thickening on the right. 3. Opacity in the right mid to lower lung is a little more prominent and could represent atelectasis versus developing infiltrate. Recommend attention on follow-up. 4. Small left pleural effusion with underlying atelectasis. Electronically Signed   By: Vanessa Webb M.D   On: 06/21/2018 06:49   Dg Chest Port 1 View  Result Date: 06/20/2018 CLINICAL DATA:  73 year old female with loculated right pleural effusion, right chest empyema, right chest tube. EXAM: PORTABLE CHEST 1  VIEW COMPARISON:  06/19/2018 and earlier. FINDINGS: Portable AP semi upright view at 0615 hours. Stable right chest tube and right IJ central line. Residual right lung loculated fluid or pleural thickening is stable. No pneumothorax. Patchy right perihilar opacity is stable. Stable cardiac size and mediastinal contours. Stable left lung with small pleural effusion suspected. Stable visible IMPRESSION: 1. Stable right chest tube and right IJ central line. 2. No pneumothorax small volume residual loculated right pleural fluid or pleural thickening. 3. Small left pleural effusion suspected. Electronically Signed   By: Vanessa Webb M.D.   On: 06/20/2018 08:00     Patient Profile     Vanessa Webb is a 73 y.o. female with a history of paroxysmal atrial fibrillation, chronic anticoagulation therapy with Coumadin, ESRD on hemodialysis (T/Th/Sat), chronic anemia, chronicthrombocytopenia,type 2 diabetes mellitus, hypertension, and hyperlipidemiawho is being seen for the evaluation ofatrial fibrillation with RVRat the request of Dr. Cruzita Webb (Internal Medicine). Also with empyema.  Assessment & Plan    1 persistent atrial fibrillation-heart rate mildly elevated today.  We will continue with amiodarone and metoprolol.  I am hesitant to advance metoprolol as blood pressure is borderline and she has having issues with hypotension on dialysis.  We will follow heart rate and if reasonably well controlled in the hospital we will plan to proceed with cardioversion 3 weeks after INR therapeutic.  If rate difficult to control then we will proceed with TEE guided cardioversion once INR therapeutic.  As outlined previously decrease amiodarone to 200 mg daily in 1 week.    2 pleural effusion-empyema. S/P right VATS and drainage of right empyema.  Chest tube in place.  3 end-stage renal disease-dialysis per nephrology.  4 hyperlipidemia-continue statin.  For questions or updates, please contact [place] Please consult  [email or web address] for contact info under     Signed, Vanessa Dawley, MD  06/21/2018, 11:59 AM

## 2018-06-22 ENCOUNTER — Inpatient Hospital Stay (HOSPITAL_COMMUNITY): Payer: Medicare Other

## 2018-06-22 DIAGNOSIS — Z9889 Other specified postprocedural states: Secondary | ICD-10-CM

## 2018-06-22 LAB — GLUCOSE, CAPILLARY
Glucose-Capillary: 141 mg/dL — ABNORMAL HIGH (ref 70–99)
Glucose-Capillary: 137 mg/dL — ABNORMAL HIGH (ref 70–99)
Glucose-Capillary: 203 mg/dL — ABNORMAL HIGH (ref 70–99)
Glucose-Capillary: 170 mg/dL — ABNORMAL HIGH (ref 70–99)

## 2018-06-22 LAB — RENAL FUNCTION PANEL
Albumin: 2.1 g/dL — ABNORMAL LOW (ref 3.5–5.0)
Anion gap: 11 (ref 5–15)
BUN: 9 mg/dL (ref 8–23)
CO2: 28 mmol/L (ref 22–32)
Calcium: 7.9 mg/dL — ABNORMAL LOW (ref 8.9–10.3)
Chloride: 97 mmol/L — ABNORMAL LOW (ref 98–111)
Creatinine, Ser: 2.1 mg/dL — ABNORMAL HIGH (ref 0.44–1.00)
GFR calc Af Amer: 26 mL/min — ABNORMAL LOW (ref >60)
GFR calc non Af Amer: 23 mL/min — ABNORMAL LOW (ref >60)
Glucose, Bld: 150 mg/dL — ABNORMAL HIGH (ref 70–99)
Phosphorus: 2.3 mg/dL — ABNORMAL LOW (ref 2.5–4.6)
Potassium: 4.3 mmol/L (ref 3.5–5.1)
Sodium: 136 mmol/L (ref 135–145)

## 2018-06-22 LAB — IRON AND TIBC: Iron: 28 ug/dL (ref 28–170)

## 2018-06-22 LAB — PROTIME-INR
INR: 1.4 — ABNORMAL HIGH (ref 0.8–1.2)
Prothrombin Time: 17.4 seconds — ABNORMAL HIGH (ref 11.4–15.2)

## 2018-06-22 LAB — FERRITIN: Ferritin: 1014 ng/mL — ABNORMAL HIGH (ref 11–307)

## 2018-06-22 LAB — MAGNESIUM: Magnesium: 1.9 mg/dL (ref 1.7–2.4)

## 2018-06-22 MED ORDER — FLORANEX PO PACK: 1.0000 g | PACK | Freq: Three times a day (TID) | ORAL | Status: DC

## 2018-06-22 MED ORDER — SODIUM CHLORIDE 0.9 % IV SOLN
2.0000 g | INTRAVENOUS | Status: DC
  Administered 2018-06-25: 2 g via INTRAVENOUS
  Filled 2018-06-22: qty 2

## 2018-06-22 MED ORDER — WARFARIN SODIUM 4 MG PO TABS
4.0000 mg | ORAL_TABLET | Freq: Once | ORAL | Status: AC
  Administered 2018-06-22: 4 mg via ORAL
  Filled 2018-06-22: qty 1

## 2018-06-22 NOTE — Progress Notes (Signed)
4 Days Post-Op Procedure(s) (LRB): VIDEO ASSISTED THORACOSCOPY (VATS)/ DRAINAGE OF EMPYEMA (Right) Subjective: Some discomfort at chest tube site  Objective: Vital signs in last 24 hours: Temp:  [96.9 F (36.1 C)-97.8 F (36.6 C)] 97.7 F (36.5 C) (04/26 0741) Pulse Rate:  [88-126] 99 (04/26 0741) Cardiac Rhythm: Atrial fibrillation (04/26 0700) Resp:  [10-24] 21 (04/26 0741) BP: (91-133)/(44-73) 103/60 (04/26 0741) SpO2:  [96 %-99 %] 98 % (04/26 0741) Weight:  [51.8 kg-52.5 kg] 51.8 kg (04/26 0300)  Hemodynamic parameters for last 24 hours:    Intake/Output from previous day: 04/25 0701 - 04/26 0700 In: 440 [P.O.:340; IV Piggyback:100] Out: 863 [Chest Tube:30] Intake/Output this shift: Total I/O In: 240 [P.O.:240] Out: 0   General appearance: alert, cooperative and no distress Neurologic: intact Heart: irregularly irregular rhythm Lungs: diminished breath sounds bibasilar serous drainage from chest tube  Lab Results: Recent Labs    06/20/18 0500 06/21/18 0812  WBC 10.5 7.4  HGB 8.9* 8.5*  HCT 29.6* 28.0*  PLT 131* 114*   BMET:  Recent Labs    06/21/18 0812 06/22/18 0531  NA 134* 136  K 3.9 4.3  CL 99 97*  CO2 25 28  GLUCOSE 124* 150*  BUN 23 9  CREATININE 3.37* 2.10*  CALCIUM 7.5* 7.9*    PT/INR:  Recent Labs    06/22/18 0531  LABPROT 17.4*  INR 1.4*   ABG    Component Value Date/Time   PHART 7.427 06/19/2018 0350   HCO3 24.4 06/19/2018 0350   TCO2 27 12/03/2016 0751   ACIDBASEDEF 9.0 (H) 01/23/2009 0429   O2SAT 98.8 06/19/2018 0350   CBG (last 3)  Recent Labs    06/21/18 1615 06/21/18 2048 06/22/18 0623  GLUCAP 91 135* 141*    Assessment/Plan: S/P Procedure(s) (LRB): VIDEO ASSISTED THORACOSCOPY (VATS)/ DRAINAGE OF EMPYEMA (Right) -CXR not done yet this AM Minimal drainage from tube and no air leak- does have a space, will leave tube in today Mobilize On maxipime Coumadin resumed, INR 1.4   LOS: 10 days    Melrose Nakayama 06/22/2018

## 2018-06-22 NOTE — Progress Notes (Signed)
INFECTIOUS DISEASE PROGRESS NOTE  ID: Vanessa Webb is a 73 y.o. 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   HTN (hypertension)   Hyperlipidemia   S/P right hip fracture   Pressure injury of skin   Mitral regurgitation   Chronic combined systolic and diastolic congestive heart failure (HCC)   Pleural effusion, bilateral   Empyema of pleural space (HCC)   Left humeral fracture   Diarrhea   Trapped lung   Persistent atrial fibrillation  Subjective: No complaints  Abtx:  Anti-infectives (From admission, onward)   Start     Dose/Rate Route Frequency Ordered Stop   06/21/18 1800  ceFEPIme (MAXIPIME) 2 g in sodium chloride 0.9 % 100 mL IVPB     2 g 200 mL/hr over 30 Minutes Intravenous Every T-Th-Sa (1800) 06/21/18 0927     06/18/18 0913  vancomycin (VANCOCIN) powder  Status:  Discontinued       As needed 06/18/18 0914 06/18/18 0958   06/18/18 0600  vancomycin (VANCOCIN) IVPB 1000 mg/200 mL premix     1,000 mg 200 mL/hr over 60 Minutes Intravenous On call to O.R. 06/17/18 1443 06/18/18 0908   06/17/18 1200  ceFEPIme (MAXIPIME) 2 g in sodium chloride 0.9 % 100 mL IVPB  Status:  Discontinued     2 g 200 mL/hr over 30 Minutes Intravenous Every T-Th-Sa (Hemodialysis) 06/16/18 1001 06/21/18 0927   06/16/18 1100  ceFEPIme (MAXIPIME) 2 g in sodium chloride 0.9 % 100 mL IVPB     2 g 200 mL/hr over 30 Minutes Intravenous  Once 06/16/18 1001 06/16/18 1900   06/15/18 1130  metroNIDAZOLE (FLAGYL) IVPB 500 mg  Status:  Discontinued     500 mg 100 mL/hr over 60 Minutes Intravenous Every 8 hours 06/15/18 1033 06/16/18 1001   06/15/18 1100  Ampicillin-Sulbactam (UNASYN) 3 g in sodium chloride 0.9 % 100 mL IVPB  Status:  Discontinued     3 g 200 mL/hr over 30 Minutes Intravenous Every 12 hours 06/15/18 1051  06/16/18 1001   06/14/18 1600  cefTRIAXone (ROCEPHIN) 2 g in sodium chloride 0.9 % 100 mL IVPB  Status:  Discontinued     2 g 200 mL/hr over 30 Minutes Intravenous Every 24 hours 06/14/18 1508 06/15/18 1033      Medications:  Scheduled:  amiodarone  200 mg Oral BID   atorvastatin  20 mg Oral QHS   Chlorhexidine Gluconate Cloth  6 each Topical Q0600   collagenase   Topical Daily   [START ON 06/28/2018] darbepoetin (ARANESP) injection - DIALYSIS  40 mcg Intravenous Q Sat-HD   enoxaparin (LOVENOX) injection  30 mg Subcutaneous Q24H   feeding supplement (NEPRO CARB STEADY)  237 mL Oral BID BM   Gerhardt's butt cream   Topical QID   insulin aspart  0-24 Units Subcutaneous TID AC & HS   metoprolol tartrate  25 mg Oral BID   multivitamin  1 tablet Oral QHS   warfarin  4 mg Oral ONCE-1800   Warfarin - Pharmacist Dosing Inpatient   Does not apply q1800    Objective: Vital signs in last 24 hours: Temp:  [96.9 F (36.1 C)-97.8 F (36.6 C)] 97.8 F (36.6 C) (04/26 0300) Pulse Rate:  [88-126] 106 (04/26 0300) Resp:  [10-24] 10 (04/26 0400) BP: (78-133)/(37-73) 113/66 (04/26 0300) SpO2:  [  96 %-99 %] 98 % (04/26 0300) Weight:  [51.8 kg-52.5 kg] 51.8 kg (04/26 0300)   General appearance: alert, cooperative and no distress Resp: rhonchi anterior - right Cardio: regular rate and rhythm GI: normal findings: bowel sounds normal and soft, non-tender Extremities: edema none  Lab Results Recent Labs    06/20/18 0500 06/21/18 0812 06/22/18 0531  WBC 10.5 7.4  --   HGB 8.9* 8.5*  --   HCT 29.6* 28.0*  --   NA 135 134* 136  K 3.8 3.9 4.3  CL 95* 99 97*  CO2 27 25 28   BUN 15 23 9   CREATININE 2.54* 3.37* 2.10*   Liver Panel Recent Labs    06/20/18 0500 06/21/18 0812 06/22/18 0531  PROT 5.3*  --   --   ALBUMIN 1.6* 1.6* 2.1*  AST 20  --   --   ALT 9  --   --   ALKPHOS 65  --   --   BILITOT 0.7  --   --    Sedimentation Rate No results for input(s): ESRSEDRATE in  the last 72 hours. C-Reactive Protein No results for input(s): CRP in the last 72 hours.  Microbiology: Recent Results (from the past 240 hour(s))  Gram stain     Status: None   Collection Time: 06/13/18  5:04 PM  Result Value Ref Range Status   Specimen Description FLUID RIGHT PLEURAL  Final   Special Requests NONE  Final   Gram Stain   Final    ABUNDANT WBC PRESENT,BOTH PMN AND MONONUCLEAR NO ORGANISMS SEEN Performed at [facility], [address], [place], [place] [address]    Report Status 06/13/2018 FINAL  Final  Acid Fast Smear (AFB)     Status: None   Collection Time: 06/13/18  5:04 PM  Result Value Ref Range Status   AFB Specimen Processing Concentration  Final   Acid Fast Smear Negative  Final    Comment: (NOTE) Performed At: [facility] [place], [place] [id number] Rush Farmer MD [id number]    Source (AFB) FLUID  Final    Comment: PLEURAL RIGHT Performed at [facility], [place] [address] [place], [place] [address]   Culture, body fluid-bottle     Status: Abnormal   Collection Time: 06/13/18  5:04 PM  Result Value Ref Range Status   Specimen Description FLUID RIGHT PLEURAL  Final   Special Requests BOTTLES DRAWN AEROBIC AND ANAEROBIC  Final   Gram Stain   Final    GRAM NEGATIVE RODS AEROBIC BOTTLE ONLY CRITICAL RESULT CALLED TO, READ BACK BY AND VERIFIED WITH: E. MONGE, RN CONCERNING GRAM STAIN AT 7846 ON 06/14/18 BY C. JESSUP, MLT Performed at [facility], [place] [address], [place], [place] [address]    Culture PSEUDOMONAS AERUGINOSA (A)  Final   Report Status 06/17/2018 FINAL  Final   Organism ID, Bacteria PSEUDOMONAS AERUGINOSA  Final      Susceptibility   Pseudomonas aeruginosa - MIC*    CEFTAZIDIME 4 SENSITIVE Sensitive     CIPROFLOXACIN <=0.25 SENSITIVE Sensitive     GENTAMICIN <=1 SENSITIVE Sensitive     IMIPENEM 2 SENSITIVE Sensitive     PIP/TAZO 16 SENSITIVE Sensitive     CEFEPIME <=1 SENSITIVE  Sensitive     * PSEUDOMONAS AERUGINOSA  Surgical pcr screen     Status: None   Collection Time: 06/17/18  9:47 PM  Result Value Ref Range Status   MRSA, PCR  NEGATIVE NEGATIVE Final   Staphylococcus aureus NEGATIVE NEGATIVE Final    Comment: (NOTE) The Xpert SA Assay (FDA approved for NASAL specimens in patients 18 years of age and older), is one component of a comprehensive surveillance program. It is not intended to diagnose infection nor to guide or monitor treatment. Performed at [facility], [place] [address] [place], [place] [address]     Studies/Results: Dg Chest Port 1 View  Result Date: 06/21/2018 CLINICAL DATA:  Empyema pleural space. EXAM: PORTABLE CHEST 1 VIEW COMPARISON:  June 20, 2018 FINDINGS: The right central line is stable. The right chest tube/drain is stable. The loculated pleural fluid and associated thickening on the right are stable. Mild haziness in the mid right lung is more prominent. A small left effusion is seen. No other changes. IMPRESSION: 1. Support apparatus as above. 2. Stable loculated pleural effusion and associated pleural thickening on the right. 3. Opacity in the right mid to lower lung is a little more prominent and could represent atelectasis versus developing infiltrate. Recommend attention on follow-up. 4. Small left pleural effusion with underlying atelectasis. Electronically Signed   By: Dorise Bullion III M.D   On: 06/21/2018 06:49     Assessment/Plan: Pseudomonas empyema VATS 4-22 DM ESRD  Total days of antibiotics: 7 cefepime  She's been afebrile and her WBC has normalized Her CT output was 30cc last 24h Her f/u CXR is unchanged Await repeat CXR.  Could change anbx to ceftaz to facilitate dosing with HD?          Bobby Rumpf MD, FACP Infectious Diseases (pager) [phone] [email or web address] 06/22/2018, 8:29 AM  LOS: 10 days

## 2018-06-22 NOTE — Progress Notes (Signed)
Patient ID: Vanessa Webb, female   DOB: [birth year], 73 y.o.   MRN: [id number] [name] Progress Note   Assessment/ Plan:   1.  Atrial fibrillation with rapid ventricular response:  per cardiology; metoprolol and amio.  For future elective cardioversion per cardiology.  Note that hypotension has limited UF with HD in the past and HR running 100-130's with HD.  2.  End-stage renal disease: HD is per TTS schedule - no acute indication for HD today.  - Normally at [facility] TTS   - Note previous dry weight appears to be 53 kg on outpatient records.  Anticipate adjustment down needed given her acute illness and likely weight loss  3. Pleural effusion with empyema - s/p VATS.  With chest tube   4.  Hyponatremia: improved with HD/UF, advised to limit intake.   5. 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  aranesp given 4/24.  Check iron stores  5.  Secondary hyperparathyroidism: We will follow calcium/phosphorus levels. Stable without binder  6.  Protein calorie malnutrition: Significant hypoalbuminemia noted most possibly arising from nonhealing leg ulcer/acute phase reactant.  Continue renal vitamin/ONS.  7.  Hallucinations: off of Lomotil and metronidazole   She was screened for rehab consult which was recommended per rehab admissions coordinator  Subjective:   She had 0.8 kg UF with HD on 4/25.  She didn't have any trouble with cramping.  She's not sure of any firm plans for dispo yet.  Review of systems:  No shortness of breath or chest pain  Denies nausea or vomiting   Objective:   BP 113/66 (BP Location: Right Arm)   Pulse (!) 106   Temp 97.8 F (36.6 C) (Oral)   Resp 10   Ht 5\' 4"  (1.626 m)   Wt 51.8 kg   SpO2 98%   BMI 19.60 kg/m   Physical Exam: Gen: adult female in bed in NAD  CVS: irregularly irregular; no rub; tachy Resp: right chest tube; right lung reduced breath sounds; left lung clear; unlabored Abd: Soft, flat, nontender Ext: no lower extremity edema;  right arm less swollen Access: right AVF with bruit and thrill   Labs: BMET Recent Labs  Lab 06/16/18 0537 06/17/18 2121 06/18/18 0403 06/19/18 0620 06/20/18 0500 06/21/18 0812 06/22/18 0531  NA 138 137 138 135 135 134* 136  K 4.4 4.6 4.2 4.9 3.8 3.9 4.3  CL 96* 98 98 99 95* 99 97*  CO2 23 25 25 24 27 25 28   GLUCOSE 120* 118* 102* 137* 108* 124* 150*  BUN 26* 14 16 25* 15 23 9   CREATININE 3.91* 2.53* 2.82* 3.55* 2.54* 3.37* 2.10*  CALCIUM 8.0* 7.9* 8.1* 7.8* 7.9* 7.5* 7.9*  PHOS 4.1 2.5 2.9  --  2.9 3.6 2.3*   CBC Recent Labs  Lab 06/18/18 0403 06/19/18 0620 06/20/18 0500 06/21/18 0812  WBC 9.2 11.0* 10.5 7.4  HGB 9.7* 9.1* 8.9* 8.5*  HCT 30.8* 29.9* 29.6* 28.0*  MCV 83.5 86.4 87.3 87.0  PLT 112* 130* 131* 114*   Medications:    . amiodarone  200 mg Oral BID  . atorvastatin  20 mg Oral QHS  . Chlorhexidine Gluconate Cloth  6 each Topical Q0600  . collagenase   Topical Daily  . [START ON 06/28/2018] darbepoetin (ARANESP) injection - DIALYSIS  40 mcg Intravenous Q Sat-HD  . enoxaparin (LOVENOX) injection  30 mg Subcutaneous Q24H  . feeding supplement (NEPRO CARB STEADY)  237 mL Oral BID BM  . Gerhardt's butt cream  Topical QID  . insulin aspart  0-24 Units Subcutaneous TID AC & HS  . metoprolol tartrate  25 mg Oral BID  . multivitamin  1 tablet Oral QHS  . Warfarin - Pharmacist Dosing Inpatient   Does not apply q1800    Vanessa Webb 06/22/2018

## 2018-06-22 NOTE — Progress Notes (Signed)
Frail pt with poor appetite. She refuses Nepro. Diet changed to regular to promote some desire to eat. Will continue to assist with choices and improve intake.

## 2018-06-22 NOTE — Progress Notes (Signed)
PROGRESS NOTE    Vanessa Webb  HMC:[id number] DOB: 09-20-1945 DOA: 06/11/2018 PCP: Monico Blitz, MD   Brief Narrative: 73 year old female with history of ESRD on HD TTS, hypertension, hyperlipidemia, A. fib on Coumadin for anticoagulation and chronic right leg ulcer transferred from [facility] for hemodialysis. Patient presented to [facility] with 2 weeks of generalized weakness and found to be in A. fib with RVR. She was placed on diltiazem drip transition to amiodarone prior to transfer. Patient is dialysis on 4/14 due to weakness. Reports good compliance with her metoprolol.  Of note, patient has had poor appetite, generalized weakness and about 40 pound weight loss over the last 1 year. She fell and broke her right hip 6 months ago and underwent IM nailing. She also fell and broke her left shoulder 3 months ago on the way to dialysis that has been treated conservatively.  Patient remained in A. fib with RVR on amiodarone drip. Initially, plan for DCCV if heart rate is not under good control. This has changed after talking to CTS about the pleural effusion and noted to have Large right-sided pleural effusion  on chest x-ray and CT chest. These appear to be chronic. She had US-guided diagnostic and therapeutic right thoracocentesis on 4/17 that yielded 300 cc exudative fluid by LDH. Pleural fluid Gram stain and culture with GNR. AFB smear negative. AFB culture and cytology pending. Started on Unasyn and Flagyl. Cardiothoracic surgery consulted on 4/19 recommended stopping warfarin, INR reversal and chest tube placement by IR. Pleural fluid culture with Pseudomonas aeruginosa and transitioned to cefepime on 06/16/2018. 4/20: IR chest tube placement and Gram stain negative, cytology consistent with acute inflammation. 4/22: VATS.  Subjective: Resting well. No new complaints. Not eating well per RN. CT tube in place.  Assessment & Plan:  Empyema right chest with Pseudomonas ,  Community-acquired likely from pneumonia untreated. 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 Pathology shows "ACUTE FIBRINOUS PLEURITIS, CLINICALLY EMPYEMA"Chest tube remains in place- past 24 hr 300 cc output. CT surgery following. Repeat chest x-ray no change, effusion is improved, lung remains trapped and no pneumothorax. ID following, antibiotics changed to ceftaz 4/26 to facilitate dosing with HD-advises 4 weeks of antibiotics from the time of her decortication,  tentative ANTIBIOTIC D/C date 07/13/18.  Patient is afebrile, leukocytosis resolved.  Follow-up chest x-ray unchanged.   Recent Labs  Lab 06/17/18 0452 06/18/18 0403 06/19/18 0620 06/20/18 0500 06/21/18 0812  WBC 11.4* 9.2 11.0* 10.5 7.4    Persistent atrial fibrillation : rate high normal up to 110s. Tsh nl.rate difficult to control, cardiology plans to load her with amiodarone, attempt cardioversion 3 weeks after she maintains therapeutic INR, continue metoprolol difficult to increase due to hypotension. Cont warfarin, pharmacy dosing, INR 1.4. Appreciate cardiology inputs. Recent Labs  Lab 06/18/18 0403 06/19/18 0620 06/20/18 0500 06/21/18 0613 06/22/18 0531  INR 1.6* 1.6* 1.4* 1.4* 1.4*   ESRD  On HD TTS w anemia of chronic disease/mineral bone disease: nephrology on board for dialysis. last HD 4/24: bp was in 70s-albumin given, again difficulty with ultrafiltration due to hypotension A. fib tachycardia.  Hyponatremia improved with dialysis.  Protein calorie malnutrition moderate, with hypoalbuminemia, augment nutrition.  Is not eating well and only minimal intake, if okay with nephrology can change to regular diet.  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  Hallucination/Intermittent confusion as per the husband:  Provide supportive care. She is off of Lamictal and Flagyl. Mental status appears to stable.  Type 2 DM, hba1c 5.9 4/17, well controlled, monitor CBG.  RLE wound followed by wound care outpatient, POA,  12cm x 1.5cm x 0.2cm; 90% black soft eschar-Medial 4cm x 2.0cm x 0.2cm; 50% black soft eschar. C/w wound care   Stage  II Sacral decubitus0.4cm x 0.2cm x 0.1cm; POA.  Scabbed without drainage.  Cont wound care  HTN: Blood pressure soft/hypotension in HD.On metoprolol c/w with holding parameters.  Hyperlipidemia on lipitor  Pulmonary nodule/mild right paratracheal lymphadenopathy 8 mm LLL pulmonary nodule seen on CT chest 8 follow-up in 3 to 6 months with CT chest.  Pleural fluid is negative for malignancy.  Recurrent fal/hx of right hip fractures, comminuted left proximal humerus fracture: status post IM nail of hip 6 months ago, left shoulder managed conservatively, continue pain control, PT OT  Pressure injury unstageable full-thickness tissue loss on right leg, present on admission, wound on left leg present on admission.cont wound care  DVT prophylaxis: SCD/ warfarin Code Status: DNR Family Communication: Discussed plan of care with the patient in detail AND she verbalized understanding.Updated husband over the phone  4/24 and called again 4/26 cel/home no- unable to reach.  Disposition Plan: remains inpatient pending clinical improvement.CIR consulted. Cont pt/ot  Consultants:   CT Surgery  IR  Nephrology  Cardiology   Procedures:  HD on 4/16  IR thoracocentesis on 4/17  Chest tube placement on 4/20  VATS 4/22 Central line 4/22- removed 4/25  Antimicrobials: Anti-infectives (From admission, onward)   Start     Dose/Rate Route Frequency Ordered Stop   06/21/18 1800  ceFEPIme (MAXIPIME) 2 g in sodium chloride 0.9 % 100 mL IVPB     2 g 200 mL/hr over 30 Minutes Intravenous Every T-Th-Sa (1800) 06/21/18 0927     06/18/18 0913  vancomycin (VANCOCIN) powder  Status:   Discontinued       As needed 06/18/18 0914 06/18/18 0958   06/18/18 0600  vancomycin (VANCOCIN) IVPB 1000 mg/200 mL premix     1,000 mg 200 mL/hr over 60 Minutes Intravenous On call to O.R. 06/17/18 1443 06/18/18 0908   06/17/18 1200  ceFEPIme (MAXIPIME) 2 g in sodium chloride 0.9 % 100 mL IVPB  Status:  Discontinued     2 g 200 mL/hr over 30 Minutes Intravenous Every T-Th-Sa (Hemodialysis) 06/16/18 1001 06/21/18 0927   06/16/18 1100  ceFEPIme (MAXIPIME) 2 g in sodium chloride 0.9 % 100 mL IVPB     2 g 200 mL/hr over 30 Minutes Intravenous  Once 06/16/18 1001 06/16/18 1900   06/15/18 1130  metroNIDAZOLE (FLAGYL) IVPB 500 mg  Status:  Discontinued     500 mg 100 mL/hr over 60 Minutes Intravenous Every 8 hours 06/15/18 1033 06/16/18 1001   06/15/18 1100  Ampicillin-Sulbactam (UNASYN) 3 g in sodium chloride 0.9 % 100 mL IVPB  Status:  Discontinued     3 g 200 mL/hr over 30 Minutes Intravenous Every 12 hours 06/15/18 1051 06/16/18 1001   06/14/18 1600  cefTRIAXone (ROCEPHIN) 2 g in sodium chloride 0.9 % 100 mL IVPB  Status:  Discontinued     2 g 200 mL/hr over 30 Minutes Intravenous Every 24 hours 06/14/18 1508 06/15/18 1033       Objective: Vitals:   06/21/18 2144 06/21/18 2300 06/22/18 0300 06/22/18 0400  BP: 122/65  100/64 113/66   Pulse: 99 (!) 101 (!) 106   Resp:  17 (!) 22 10  Temp:  97.7 F (36.5 C) 97.8 F (36.6 C)   TempSrc:  Oral Oral   SpO2:  99% 98%   Weight:   51.8 kg   Height:        Intake/Output Summary (Last 24 hours) at 06/22/2018 0752 Last data filed at 06/21/2018 2200 Gross per 24 hour  Intake 440 ml  Output 833 ml  Net -393 ml   Filed Weights   06/21/18 0733 06/21/18 1151 06/22/18 0300  Weight: 53.1 kg 52.5 kg 51.8 kg   Weight change: 0 kg  Body mass index is 19.6 kg/m.  Intake/Output from previous day: 04/25 0701 - 04/26 0700 In: 440 [P.O.:340; IV Piggyback:100] Out: 833  Intake/Output this shift: No intake/output data recorded.   Examination: General exam:, Alert awake, frail, deconditioned, chronically sick looking.  HEENT:Oral mucosa moist, Ear/Nose WNL grossly, dentition normal. Respiratory system:  Diminished  air entry on the right chest .no use of accessory muscle, chest tube in place. Cardiovascular system: irregular rate and rhythm, S1 & S2 heard, No JVD/murmurs. Gastrointestinal system: Abdomen soft, non-tender, non-distended, BS +. No hepatosplenomegaly palpable. Nervous System:Alert, awake and oriented at baseline. Able to move UE and LE, sensation intact. Extremities: No edema, distal peripheral pulses palpable.  Skin: No rashes,no icterus. MSK: thin muscle bulk,tone, power  Medications:  Scheduled Meds: . amiodarone  200 mg Oral BID  . atorvastatin  20 mg Oral QHS  . Chlorhexidine Gluconate Cloth  6 each Topical Q0600  . collagenase   Topical Daily  . [START ON 06/28/2018] darbepoetin (ARANESP) injection - DIALYSIS  40 mcg Intravenous Q Sat-HD  . enoxaparin (LOVENOX) injection  30 mg Subcutaneous Q24H  . feeding supplement (NEPRO CARB STEADY)  237 mL Oral BID BM  . Gerhardt's butt cream   Topical QID  . insulin aspart  0-24 Units Subcutaneous TID AC & HS  . metoprolol tartrate  25 mg Oral BID  . multivitamin  1 tablet Oral QHS  . Warfarin - Pharmacist Dosing Inpatient   Does not apply q1800   Continuous Infusions: . ceFEPime (MAXIPIME) IV 2 g (06/21/18 1724)    Data Reviewed: I have personally reviewed following labs and imaging studies  CBC: Recent Labs  Lab 06/17/18 0452 06/18/18 0403 06/19/18 0620 06/20/18 0500 06/21/18 0812  WBC 11.4* 9.2 11.0* 10.5 7.4  HGB 10.7* 9.7* 9.1* 8.9* 8.5*  HCT 35.4* 30.8* 29.9* 29.6* 28.0*  MCV 84.5 83.5 86.4 87.3 87.0  PLT 130* 112* 130* 131* 527*   Basic Metabolic Panel: Recent Labs  Lab 06/17/18 2121 06/18/18 0403 06/19/18 0620 06/20/18 0500 06/21/18 0613 06/21/18 0812 06/22/18 0531  NA 137 138 135 135  --  134* 136  K 4.6 4.2 4.9 3.8   --  3.9 4.3  CL 98 98 99 95*  --  99 97*  CO2 25 25 24 27   --  25 28  GLUCOSE 118* 102* 137* 108*  --  124* 150*  BUN 14 16 25* 15  --  23 9  CREATININE 2.53* 2.82* 3.55* 2.54*  --  3.37* 2.10*  CALCIUM 7.9* 8.1* 7.8* 7.9*  --  7.5* 7.9*  MG  --  1.8 1.9 1.9 2.1  --  1.9  PHOS 2.5 2.9  --  2.9  --  3.6 2.3*   GFR: Estimated Creatinine Clearance: 19.2 mL/min (A) (by C-G formula based on SCr of  2.1 mg/dL (H)). Liver Function Tests: Recent Labs  Lab 06/17/18 2121 06/18/18 0403 06/20/18 0500 06/21/18 0812 06/22/18 0531  AST  --   --  20  --   --   ALT  --   --  9  --   --   ALKPHOS  --   --  65  --   --   BILITOT  --   --  0.7  --   --   PROT  --   --  5.3*  --   --   ALBUMIN 1.5* 1.6* 1.6* 1.6* 2.1*   No results for input(s): LIPASE, AMYLASE in the last 168 hours. No results for input(s): AMMONIA in the last 168 hours. Coagulation Profile: Recent Labs  Lab 06/18/18 0403 06/19/18 0620 06/20/18 0500 06/21/18 0613 06/22/18 0531  INR 1.6* 1.6* 1.4* 1.4* 1.4*   Cardiac Enzymes: No results for input(s): CKTOTAL, CKMB, CKMBINDEX, TROPONINI in the last 168 hours. BNP (last 3 results) No results for input(s): PROBNP in the last 8760 hours. HbA1C: No results for input(s): HGBA1C in the last 72 hours. CBG: Recent Labs  Lab 06/20/18 2312 06/21/18 0540 06/21/18 1615 06/21/18 2048 06/22/18 0623  GLUCAP 106* 134* 91 135* 141*   Lipid Profile: No results for input(s): CHOL, HDL, LDLCALC, TRIG, CHOLHDL, LDLDIRECT in the last 72 hours. Thyroid Function Tests: No results for input(s): TSH, T4TOTAL, FREET4, T3FREE, THYROIDAB in the last 72 hours. Anemia Panel: No results for input(s): VITAMINB12, FOLATE, FERRITIN, TIBC, IRON, RETICCTPCT in the last 72 hours. Sepsis Labs: No results for input(s): PROCALCITON, LATICACIDVEN in the last 168 hours.  Recent Results (from the past 240 hour(s))  Gram stain     Status: None   Collection Time: 06/13/18  5:04 PM  Result Value Ref  Range Status   Specimen Description FLUID RIGHT PLEURAL  Final   Special Requests NONE  Final   Gram Stain   Final    ABUNDANT WBC PRESENT,BOTH PMN AND MONONUCLEAR NO ORGANISMS SEEN Performed at [facility], [address] [place], [place] [address]    Report Status 06/13/2018 FINAL  Final  Acid Fast Smear (AFB)     Status: None   Collection Time: 06/13/18  5:04 PM  Result Value Ref Range Status   AFB Specimen Processing Concentration  Final   Acid Fast Smear Negative  Final    Comment: (NOTE) Performed At: [facility] [place], [place] [id number] Rush Farmer MD AS:[id number]    Source (AFB) FLUID  Final    Comment: PLEURAL RIGHT Performed at [facility], [place] [address], [place], [place] [address]   Culture, body fluid-bottle     Status: Abnormal   Collection Time: 06/13/18  5:04 PM  Result Value Ref Range Status   Specimen Description FLUID RIGHT PLEURAL  Final   Special Requests BOTTLES DRAWN AEROBIC AND ANAEROBIC  Final   Gram Stain   Final    GRAM NEGATIVE RODS AEROBIC BOTTLE ONLY CRITICAL RESULT CALLED TO, READ BACK BY AND VERIFIED WITH: E. MONGE, RN CONCERNING GRAM STAIN AT 0240 ON 06/14/18 BY C. JESSUP, MLT Performed at [facility], [address], [place], [place] [address]    Culture PSEUDOMONAS AERUGINOSA (A)  Final   Report Status 06/17/2018 FINAL  Final   Organism ID, Bacteria PSEUDOMONAS AERUGINOSA  Final      Susceptibility   Pseudomonas aeruginosa - MIC*    CEFTAZIDIME 4 SENSITIVE Sensitive  CIPROFLOXACIN <=0.25 SENSITIVE Sensitive     GENTAMICIN <=1 SENSITIVE Sensitive     IMIPENEM 2 SENSITIVE Sensitive     PIP/TAZO 16 SENSITIVE Sensitive     CEFEPIME <=1 SENSITIVE Sensitive     * PSEUDOMONAS AERUGINOSA  Surgical pcr screen     Status: None   Collection Time: 06/17/18  9:47 PM  Result Value Ref Range Status   MRSA, PCR NEGATIVE NEGATIVE Final   Staphylococcus aureus NEGATIVE NEGATIVE Final     Comment: (NOTE) The Xpert SA Assay (FDA approved for NASAL specimens in patients 72 years of age and older), is one component of a comprehensive surveillance program. It is not intended to diagnose infection nor to guide or monitor treatment. Performed at [facility], [place] [address], [place], [place] [address]       Radiology Studies: Dg Chest Port 1 View  Result Date: 06/21/2018 CLINICAL DATA:  Empyema pleural space. EXAM: PORTABLE CHEST 1 VIEW COMPARISON:  June 20, 2018 FINDINGS: The right central line is stable. The right chest tube/drain is stable. The loculated pleural fluid and associated thickening on the right are stable. Mild haziness in the mid right lung is more prominent. A small left effusion is seen. No other changes. IMPRESSION: 1. Support apparatus as above. 2. Stable loculated pleural effusion and associated pleural thickening on the right. 3. Opacity in the right mid to lower lung is a little more prominent and could represent atelectasis versus developing infiltrate. Recommend attention on follow-up. 4. Small left pleural effusion with underlying atelectasis. Electronically Signed   By: Dorise Bullion III M.D   On: 06/21/2018 06:49      LOS: 10 days   Time spent: More than 50% of that time was spent in counseling and/or coordination of care.  Antonieta Pert, MD [facility]  06/22/2018, 7:52 AM

## 2018-06-22 NOTE — Progress Notes (Signed)
Progress Note  Patient Name: Vanessa Webb Date of Encounter: 06/22/2018  Primary Cardiologist: Carlyle Dolly, MD   Subjective   Overall laying in bed, comfortable, no significant shortness of breath, no chest pain  Inpatient Medications    Scheduled Meds: . amiodarone  200 mg Oral BID  . atorvastatin  20 mg Oral QHS  . Chlorhexidine Gluconate Cloth  6 each Topical Q0600  . collagenase   Topical Daily  . [START ON 06/28/2018] darbepoetin (ARANESP) injection - DIALYSIS  40 mcg Intravenous Q Sat-HD  . enoxaparin (LOVENOX) injection  30 mg Subcutaneous Q24H  . feeding supplement (NEPRO CARB STEADY)  237 mL Oral BID BM  . Gerhardt's butt cream   Topical QID  . insulin aspart  0-24 Units Subcutaneous TID AC & HS  . metoprolol tartrate  25 mg Oral BID  . multivitamin  1 tablet Oral QHS  . warfarin  4 mg Oral ONCE-1800  . Warfarin - Pharmacist Dosing Inpatient   Does not apply q1800   Continuous Infusions: . ceFEPime (MAXIPIME) IV 2 g (06/21/18 1724)   PRN Meds: acetaminophen **OR** acetaminophen, acetaminophen, antiseptic oral rinse, bisacodyl, loperamide, morphine injection, ondansetron (ZOFRAN) IV, senna-docusate, traMADol   Vital Signs    Vitals:   06/21/18 2300 06/22/18 0300 06/22/18 0400 06/22/18 0741  BP: 100/64 113/66  103/60  Pulse: (!) 101 (!) 106  99  Resp: 17 (!) 22 10 (!) 21  Temp: 97.7 F (36.5 C) 97.8 F (36.6 C)  97.7 F (36.5 C)  TempSrc: Oral Oral  Oral  SpO2: 99% 98%  98%  Weight:  51.8 kg    Height:        Intake/Output Summary (Last 24 hours) at 06/22/2018 1004 Last data filed at 06/22/2018 0843 Gross per 24 hour  Intake 680 ml  Output 863 ml  Net -183 ml   Last 3 Weights 06/22/2018 06/21/2018 06/21/2018  Weight (lbs) 114 lb 3.2 oz 115 lb 11.9 oz 117 lb 1 oz  Weight (kg) 51.8 kg 52.5 kg 53.1 kg      Telemetry    80s to 110s atrial fibrillation- Personally Reviewed  ECG    A. fib- Personally Reviewed  Physical Exam   GEN: No acute  distress.  Thin, laying in bed Neck: No JVD Respiratory:  Respiratory effort is normal. GI: Soft, nontender, non-distended  MS:  Minimal pedal edema; No deformity. Neuro:  Nonfocal  Psych: Normal affect   Labs    Chemistry Recent Labs  Lab 06/20/18 0500 06/21/18 0812 06/22/18 0531  NA 135 134* 136  K 3.8 3.9 4.3  CL 95* 99 97*  CO2 27 25 28   GLUCOSE 108* 124* 150*  BUN 15 23 9   CREATININE 2.54* 3.37* 2.10*  CALCIUM 7.9* 7.5* 7.9*  PROT 5.3*  --   --   ALBUMIN 1.6* 1.6* 2.1*  AST 20  --   --   ALT 9  --   --   ALKPHOS 65  --   --   BILITOT 0.7  --   --   GFRNONAA 18* 13* 23*  GFRAA 21* 15* 26*  ANIONGAP 13 10 11      Hematology Recent Labs  Lab 06/19/18 0620 06/20/18 0500 06/21/18 0812  WBC 11.0* 10.5 7.4  RBC 3.46* 3.39* 3.22*  HGB 9.1* 8.9* 8.5*  HCT 29.9* 29.6* 28.0*  MCV 86.4 87.3 87.0  MCH 26.3 26.3 26.4  MCHC 30.4 30.1 30.4  RDW 17.9* 17.7* 17.4*  PLT 130*  131* 114*    Cardiac EnzymesNo results for input(s): TROPONINI in the last 168 hours. No results for input(s): TROPIPOC in the last 168 hours.   BNPNo results for input(s): BNP, PROBNP in the last 168 hours.   DDimer No results for input(s): DDIMER in the last 168 hours.   Radiology    Dg Chest Port 1 View  Result Date: 06/21/2018 CLINICAL DATA:  Empyema pleural space. EXAM: PORTABLE CHEST 1 VIEW COMPARISON:  June 20, 2018 FINDINGS: The right central line is stable. The right chest tube/drain is stable. The loculated pleural fluid and associated thickening on the right are stable. Mild haziness in the mid right lung is more prominent. A small left effusion is seen. No other changes. IMPRESSION: 1. Support apparatus as above. 2. Stable loculated pleural effusion and associated pleural thickening on the right. 3. Opacity in the right mid to lower lung is a little more prominent and could represent atelectasis versus developing infiltrate. Recommend attention on follow-up. 4. Small left pleural  effusion with underlying atelectasis. Electronically Signed   By: Dorise Bullion III M.D   On: 06/21/2018 06:49    Cardiac Studies   Echo 06/11/2018: -EF 40 to 45%, mild elevated pulmonary pressures moderate mitral regurgitation  Patient Profile     73 y.o. female end-stage renal disease on hemodialysis with persistent atrial fibrillation difficult rate control with transient hypotension on Coumadin with empyema chest tube  Assessment & Plan    Persistent atrial fibrillation - Challenging to rate control, currently on amiodarone.  Difficult to increase beta-blocker because of hypotension especially during hemodialysis sessions. - Plan would be to continue to load her with the amiodarone and attempt cardioversion 3 weeks after she maintains therapeutic INR from 2-3. - Current rates have been in the 90s.  Reasonable.  End-stage renal disease -Nephrology notes reviewed.  Hemodialysis can be challenging secondary to hypotension.  Empyema - Dr. Roxan Hockey note reviewed, VATS, right empyema, chest tube.     For questions or updates, please contact [place] Please consult [email or web address] for contact info under        Signed, Candee Furbish, MD  06/22/2018, 10:04 AM

## 2018-06-22 NOTE — Progress Notes (Addendum)
[facility] for warfarin Indication: atrial fibrillation  Allergies  Allergen Reactions  . Sulfa Antibiotics Nausea Only    Patient Measurements: Height: 5\' 4"  (162.6 cm) Weight: 114 lb 3.2 oz (51.8 kg) IBW/kg (Calculated) : 54.7  Vital Signs: Temp: 97.8 F (36.6 C) (04/26 0300) Temp Source: Oral (04/26 0300) BP: 113/66 (04/26 0300) Pulse Rate: 106 (04/26 0300)  Labs: Recent Labs    06/20/18 0500 06/21/18 0613 06/21/18 0812 06/22/18 0531  HGB 8.9*  --  8.5*  --   HCT 29.6*  --  28.0*  --   PLT 131*  --  114*  --   LABPROT 17.2* 17.2*  --  17.4*  INR 1.4* 1.4*  --  1.4*  CREATININE 2.54*  --  3.37* 2.10*    Estimated Creatinine Clearance: 19.2 mL/min (A) (by C-G formula based on SCr of 2.1 mg/dL (H)).   Medical History: Past Medical History:  Diagnosis Date  . Anemia    takes Folic Acid;pt states she gets an injection every 2wks   . Chronic combined systolic and diastolic congestive heart failure ([place])   . Closed right hip fracture, initial encounter ([place]) 10/19/2017  . Depression    but doesn't take any meds for it  . Diabetes mellitus    takes Lantus and Novolog 70/30;fasting blood sugar 200;Type 2  . GERD (gastroesophageal reflux disease)    takes Omeprazole daily  . GI bleeding   . History of bladder infections    last one a month ago;saw Dr.Wrenn and cysto was done in office;was on antibiotics and completed 2wks ago  . History of blood transfusion    no blood transfusion  . HLD (hyperlipidemia) 10/19/2017  . HTN (hypertension) 10/19/2017  . Hyperlipidemia    takes Zocor daily  . Hypertension    takes Coreg daily  . Mitral regurgitation   . Myocardial infarction ([place]) 2010  . PAF (paroxysmal atrial fibrillation) ([place])   . Pancreatitis   . Peripheral neuropathy   . Pleural effusion, bilateral   . 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 UTI (lower urinary tract infection)     Assessment: 73 year old female s/p  VATS 4/22. Patient with history of afib on warfarin prior to admission. Pharmacy has been consulted for warfarin dosing.   PTA regimen is 2.5 mg daily except 1.25 mg on Saturday. Admit INR was 4.  INR unchanged from yesterday, remains subtherapeutic at 1.4 - warfarin restarted on 4/23. Of note, ABLA noted, and pt was given Vitamin K IV 10 mg on 4/19 and 4/20, with an additional 5 mg IV x1 4/20. Additionally, pt has been on amiodarone since 4/15, which can increase warfarin concentrations and therefore INR. Pt is also on Lovenox for DVT ppx while INR is subtherapeutic. CBC slightly down from yesterday.  ID: Cefepime D7 pseudomonal empyema. Chest tube placement 4/20, VATs 4/22 -WBC currently normal, afeb -ID consulted and has changed antibiotics to fortaz, will dose with HD  Goal of Therapy:  INR 2-3 Monitor platelets by anticoagulation protocol: Yes   Plan:  Repeat Warfarin 4 mg x1 tonight Daily INR Fortaz 2g TTS with hemodialysis  Erin Hearing PharmD., BCPS Clinical Pharmacist 06/22/2018 8:26 AM

## 2018-06-23 ENCOUNTER — Inpatient Hospital Stay (HOSPITAL_COMMUNITY): Payer: Medicare Other

## 2018-06-23 ENCOUNTER — Telehealth: Payer: Self-pay | Admitting: Adult Health

## 2018-06-23 LAB — GLUCOSE, CAPILLARY
Glucose-Capillary: 118 mg/dL — ABNORMAL HIGH (ref 70–99)
Glucose-Capillary: 91 mg/dL (ref 70–99)
Glucose-Capillary: 169 mg/dL — ABNORMAL HIGH (ref 70–99)

## 2018-06-23 LAB — CBC
HCT: 29.4 % — ABNORMAL LOW (ref 36.0–46.0)
Hemoglobin: 8.8 g/dL — ABNORMAL LOW (ref 12.0–15.0)
MCH: 26.3 pg (ref 26.0–34.0)
MCHC: 29.9 g/dL — ABNORMAL LOW (ref 30.0–36.0)
MCV: 88 fL (ref 80.0–100.0)
Platelets: 94 10*3/uL — ABNORMAL LOW (ref 150–400)
RBC: 3.34 MIL/uL — ABNORMAL LOW (ref 3.87–5.11)
RDW: 17.2 % — ABNORMAL HIGH (ref 11.5–15.5)
WBC: 6.7 10*3/uL (ref 4.0–10.5)
nRBC: 0 % (ref 0.0–0.2)

## 2018-06-23 LAB — PROTIME-INR
INR: 2.1 — ABNORMAL HIGH (ref 0.8–1.2)
Prothrombin Time: 23.4 seconds — ABNORMAL HIGH (ref 11.4–15.2)

## 2018-06-23 MED ORDER — WARFARIN SODIUM 1 MG PO TABS
1.0000 mg | ORAL_TABLET | Freq: Once | ORAL | Status: AC
  Administered 2018-06-23: 1 mg via ORAL
  Filled 2018-06-23: qty 1

## 2018-06-23 NOTE — Progress Notes (Signed)
[place] for warfarin Indication: atrial fibrillation  Allergies  Allergen Reactions  . Sulfa Antibiotics Nausea Only    Patient Measurements: Height: 5\' 4"  (162.6 cm) Weight: 113 lb 8.6 oz (51.5 kg) IBW/kg (Calculated) : 54.7  Vital Signs: Temp: 97.5 F (36.4 C) (04/27 0717) Temp Source: Oral (04/27 0717) BP: 83/54 (04/27 0717) Pulse Rate: 99 (04/27 0717)  Labs: Recent Labs    06/21/18 5956 06/21/18 0812 06/22/18 0531 06/23/18 0247  HGB  --  8.5*  --  8.8*  HCT  --  28.0*  --  29.4*  PLT  --  114*  --  94*  LABPROT 17.2*  --  17.4* 23.4*  INR 1.4*  --  1.4* 2.1*  CREATININE  --  3.37* 2.10*  --     Estimated Creatinine Clearance: 19.1 mL/min (A) (by C-G formula based on SCr of 2.1 mg/dL (H)).   Medical History: Past Medical History:  Diagnosis Date  . Anemia    takes Folic Acid;pt states she gets an injection every 2wks   . Chronic combined systolic and diastolic congestive heart failure ([place])   . Closed right hip fracture, initial encounter ([place]) 10/19/2017  . Depression    but doesn't take any meds for it  . Diabetes mellitus    takes Lantus and Novolog 70/30;fasting blood sugar 200;Type 2  . GERD (gastroesophageal reflux disease)    takes Omeprazole daily  . GI bleeding   . History of bladder infections    last one a month ago;saw Dr.Wrenn and cysto was done in office;was on antibiotics and completed 2wks ago  . History of blood transfusion    no blood transfusion  . HLD (hyperlipidemia) 10/19/2017  . HTN (hypertension) 10/19/2017  . Hyperlipidemia    takes Zocor daily  . Hypertension    takes Coreg daily  . Mitral regurgitation   . Myocardial infarction ([place]) 2010  . PAF (paroxysmal atrial fibrillation) ([place])   . Pancreatitis   . Peripheral neuropathy   . Pleural effusion, bilateral   . 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 UTI (lower urinary tract infection)     Assessment: 73 year old female s/p VATS  4/22. Patient with history of afib on warfarin prior to admission. Pharmacy has been consulted for warfarin dosing.   PTA regimen is 2.5 mg daily except 1.25 mg on Saturday. Admit INR was 4.  INR jumped from 1.4 to 2.1 - warfarin restarted on 4/23. Of note, ABLA noted, and pt was given Vitamin K IV 10 mg on 4/19 and 4/20, with an additional 5 mg IV x1 4/20. Additionally, pt has been on amiodarone since 4/15, which can increase warfarin concentrations and therefore INR. Pt is also on Lovenox for DVT ppx while INR is subtherapeutic. CBC slightly down from yesterday.  Goal of Therapy:  INR 2-3 Monitor platelets by anticoagulation protocol: Yes   Plan:  Warfarin 1 mg tonight given acute jump If INR remains therapeutic tomorrow, would stop enoxaparin for DVT ppx Daily INR  Antonietta Jewel, PharmD, BCCCP Clinical Pharmacist  Pager: [phone] Phone: [phone] 06/23/2018 7:41 AM

## 2018-06-23 NOTE — Care Management Important Message (Signed)
Important Message  Patient Details  Name: Vanessa Webb MRN: [id number] Date of Birth: [birth year]   Medicare Important Message Given:  Yes    Advit Trethewey Montine Circle 06/23/2018, 10:45 AM

## 2018-06-23 NOTE — Evaluation (Signed)
Occupational Therapy Evaluation Patient Details Name: Vanessa Webb MRN: [id number] DOB: August 09, 1945 Today's Date: 06/23/2018    History of Present Illness  Vanessa Webb is a 73 y.o. female with medical history significant of ESRD on TTS schedule, hypertension, hyperlipidemia, type 2 diabetes mellitus, on chronic anticoagulation with Coumadin presents to the hospital with chief complaint of generalized weakness.  Found to be in Afib with RVR.  CT showed large right sided pleural effusion.  pt s/p right thoracocentensis on 4/17.  Chest tube placed by IR 4/19.  Pt s/p VATS for empyema with pseudomonas 4/22.   Clinical Impression   PTA Pt at home getting assist from husband for ADL and using RW for mobility and transfers. Pt is currently mod A for transfers, and min guard to max A for ADL. Pt able to participate in sink level grooming working on standing balance, activity tolerance, and only needing min cues for problem solving at sink. Pt will require skilled OT at the acute setting and afterwards at the CIR level. Next session to focus on access to LB.   Of note: significant drainage from recent chest tube being pulled. RN aware and assessing.     Follow Up Recommendations  CIR;Supervision/Assistance - 24 hour    Equipment Recommendations  Other (comment)(defer to next venue)    Recommendations for Other Services       Precautions / Restrictions Precautions Precautions: Fall;Other (comment)(Chest tube R) Restrictions Weight Bearing Restrictions: No      Mobility Bed Mobility Overal bed mobility: Needs Assistance Bed Mobility: Supine to Sit     Supine to sit: Mod assist;Max assist     General bed mobility comments: assist to come up and forward, support for pt's ability to get R UE involved.  Transfers Overall transfer level: Needs assistance Equipment used: 1 person hand held assist Transfers: Sit to/from [facility] Sit to Stand: Mod assist Stand pivot  transfers: Mod assist       General transfer comment: Assist for coming forward and for boost.  pt with improved SPT to recliner initially, more unsteady and pushing out with second attempt.     Balance Overall balance assessment: Needs assistance Sitting-balance support: No upper extremity supported;Feet supported Sitting balance-Leahy Scale: Fair     Standing balance support: During functional activity;Bilateral upper extremity supported;Single extremity supported Standing balance-Leahy Scale: Poor Standing balance comment: able to static stand at sink with at least SUE support and leans against sink                           ADL either performed or assessed with clinical judgement   ADL Overall ADL's : Needs assistance/impaired Eating/Feeding: Modified independent   Grooming: Oral care;Wash/dry face;Minimal assistance;Standing Grooming Details (indicate cue type and reason): sit<>stand at the sink, leans against sink for stability - but visibly fatigues with standing activity Upper Body Bathing: Moderate assistance   Lower Body Bathing: Moderate assistance   Upper Body Dressing : Min guard   Lower Body Dressing: Maximal assistance;Sit to/from stand   Toilet Transfer: Moderate assistance;Stand-pivot Toilet Transfer Details (indicate cue type and reason): simulated through recliner transfer [place] and Hygiene: Moderate assistance;Sit to/from stand       Functional mobility during ADLs: Moderate assistance;+2 for physical assistance;+2 for safety/equipment General ADL Comments: fatigues quickly, very unsteady on her feet, watch for feet to push back in an odd fashion     Vision Baseline Vision/History: Wears  glasses Wears Glasses: At all times Patient Visual Report: No change from baseline       Perception     Praxis      Pertinent Vitals/Pain Pain Assessment: 0-10 Pain Score: 4  Pain Location: R side of chest (from tube  removal) Pain Descriptors / Indicators: Dull Pain Intervention(s): Limited activity within patient's tolerance;Monitored during session;Repositioned     Hand Dominance Right   Extremity/Trunk Assessment Upper Extremity Assessment Upper Extremity Assessment: Generalized weakness;LUE deficits/detail LUE Deficits / Details: fell and broke shoulder 3 months ago. decreased ROM.  LUE: Unable to fully assess due to pain LUE Coordination: decreased gross motor   Lower Extremity Assessment Lower Extremity Assessment: Defer to PT evaluation       Communication Communication Communication: No difficulties   Cognition Arousal/Alertness: Awake/alert Behavior During Therapy: Flat affect Overall Cognitive Status: No family/caregiver present to determine baseline cognitive functioning Area of Impairment: Following commands;Problem solving                       Following Commands: Follows one step commands with increased time     Problem Solving: Slow processing;Requires verbal cues General Comments: slow processing, able to follow simple commands   General Comments  Pt with recent chest tube pulled, when Pt sat EOB noticed the bed pad was very wet from pooled discharge, dressing saturated, and noticeably dripping. RN notified and aware. Otherwise VSS throughout session    Exercises     Shoulder Instructions      Home Living Family/patient expects to be discharged to:: Private residence Living Arrangements: Spouse/significant other Available Help at Discharge: Family(? `24/7) Type of Home: House Home Access: Stairs to enter [employer] of Steps: 2 STE Entrance Stairs-Rails: None Home Layout: One level     Bathroom Shower/Tub: Corporate investment banker: Standard Bathroom Accessibility: No   Home Equipment: Tub bench;Grab bars - tub/shower;Bedside commode;Walker - 2 wheels          Prior Functioning/Environment Level of Independence:  Needs assistance(has needed assist or AD for over a month)  Gait / Transfers Assistance Needed: uses RW - but has been trouble walking for over a month ADL's / Homemaking Assistance Needed: assist from husband   Comments: Husband drives her to and from HD        OT Problem List: Decreased strength;Decreased activity tolerance;Impaired balance (sitting and/or standing);Decreased cognition;Decreased safety awareness;Impaired UE functional use;Pain      OT Treatment/Interventions: Self-care/ADL training;Energy conservation;DME and/or AE instruction;Therapeutic activities;Patient/family education;Balance training;Therapeutic exercise    OT Goals(Current goals can be found in the care plan section) Acute Rehab OT Goals Patient Stated Goal: Wants to get home as soon as she can. OT Goal Formulation: With patient Time For Goal Achievement: 07/07/18 Potential to Achieve Goals: Good ADL Goals Pt Will Perform Grooming: with supervision;standing Pt Will Perform Upper Body Dressing: with set-up;sitting Pt Will Perform Lower Body Dressing: with supervision;sit to/from stand Pt Will Transfer to Toilet: ambulating;with mod assist Pt Will Perform Toileting - Clothing Manipulation and hygiene: with supervision;sit to/from stand  OT Frequency: Min 2X/week   Barriers to D/C:            Co-evaluation PT/OT/SLP Co-Evaluation/Treatment: Yes Reason for Co-Treatment: For patient/therapist safety;To address functional/ADL transfers PT goals addressed during session: Mobility/safety with mobility;Balance;Strengthening/ROM OT goals addressed during session: ADL's and self-care;Proper use of Adaptive equipment and DME;Strengthening/ROM      AM-PAC OT "6 Clicks" Daily Activity     Outcome  Measure Help from another person eating meals?: None Help from another person taking care of personal grooming?: A Little Help from another person toileting, which includes using toliet, bedpan, or urinal?: A Lot Help  from another person bathing (including washing, rinsing, drying)?: A Lot Help from another person to put on and taking off regular upper body clothing?: A Little Help from another person to put on and taking off regular lower body clothing?: A Lot 6 Click Score: 16   End of Session Nurse Communication: Mobility status;Other (comment)(chest tub site drainage)  Activity Tolerance: Patient tolerated treatment well Patient left: in chair;with call bell/phone within reach  OT Visit Diagnosis: Unsteadiness on feet (R26.81);Other abnormalities of gait and mobility (R26.89);Muscle weakness (generalized) (M62.81);Adult, failure to thrive (R62.7);Other symptoms and signs involving cognitive function                Time: 8811-0315 OT Time Calculation (min): 29 min Charges:  OT General Charges $OT Visit: 1 Visit OT Evaluation $OT Eval Moderate Complexity: Galliano OTR/L Acute Rehabilitation Services Pager: [phone] Office: [place] 06/23/2018, 3:35 PM

## 2018-06-23 NOTE — Progress Notes (Signed)
Physical Therapy Treatment Patient Details Name: Vanessa Webb MRN: [id number] DOB: 13-Jan-1946 Today's Date: 06/23/2018    History of Present Illness  Vanessa Webb is a 73 y.o. female with medical history significant of ESRD on TTS schedule, hypertension, hyperlipidemia, type 2 diabetes mellitus, on chronic anticoagulation with Coumadin presents to the hospital with chief complaint of generalized weakness.  Found to be in Afib with RVR.  CT showed large right sided pleural effusion.  pt s/p right thoracocentensis on 4/17.  Chest tube placed by IR 4/19.  Pt s/p VATS for empyema with pseudomonas 4/22.    PT Comments    Pt was a little nauseated and needed some encouragement to participate.  Emphasis was on standing at sink for activity tolerance, balance, strengthening while doing a couple of ADL tasks and multiple transfers.    Follow Up Recommendations  CIR;Supervision/Assistance - 24 hour     Equipment Recommendations  (TBA)    Recommendations for Other Services       Precautions / Restrictions Precautions Precautions: Fall;Other (comment)(Chest tube R) Restrictions Weight Bearing Restrictions: No    Mobility  Bed Mobility Overal bed mobility: Needs Assistance Bed Mobility: Supine to Sit     Supine to sit: Mod assist;Max assist     General bed mobility comments: assist to come up and forward, support for pt's ability to get R UE involved.  Transfers Overall transfer level: Needs assistance Equipment used: 1 person hand held assist Transfers: Sit to/from [facility] Sit to Stand: Mod assist Stand pivot transfers: Mod assist       General transfer comment: Assist for coming forward and for boost.  pt with improved SPT to recliner initially, more unsteady and pushing out with second attempt.   Ambulation/Gait             General Gait Details: Unable   Stairs             Wheelchair Mobility    Modified Rankin (Stroke Patients  Only)       Balance Overall balance assessment: Needs assistance Sitting-balance support: No upper extremity supported;Feet supported Sitting balance-Leahy Scale: Fair     Standing balance support: During functional activity;Bilateral upper extremity supported;Single extremity supported Standing balance-Leahy Scale: Poor Standing balance comment: able to static stand at sink with at least SUE support and leans against sink.  Total time 2-3 min with noticeable fatigue.                            Cognition Arousal/Alertness: Awake/alert Behavior During Therapy: Flat affect Overall Cognitive Status: No family/caregiver present to determine baseline cognitive functioning Area of Impairment: Following commands;Problem solving                       Following Commands: Follows one step commands with increased time     Problem Solving: Slow processing;Requires verbal cues General Comments: slow processing, able to follow simple commands      Exercises Other Exercises Other Exercises: warm up ROM exercise prior to mobility  bil with graded resistance ~7 reps.    General Comments General comments (skin integrity, edema, etc.): Pt with recent chest tube pulled, when Pt sat EOB noticed the bed pad was very wet from pooled discharge, dressing saturated, and noticeably dripping. RN notified and aware. Otherwise VSS throughout session      Pertinent Vitals/Pain Pain Assessment: 0-10 Pain Score: 4  Pain Location: R  side of chest (from tube removal) Pain Descriptors / Indicators: Dull Pain Intervention(s): Limited activity within patient's tolerance    Home Living Family/patient expects to be discharged to:: Private residence Living Arrangements: Spouse/significant other Available Help at Discharge: Family(? `24/7) Type of Home: House Home Access: Stairs to enter Entrance Stairs-Rails: None Home Layout: One level Home Equipment: Tub bench;Grab bars -  tub/shower;Bedside commode;Walker - 2 wheels      Prior Function Level of Independence: Needs assistance(has needed assist or AD for over a month)  Gait / Transfers Assistance Needed: uses RW - but has been trouble walking for over a month ADL's / Homemaking Assistance Needed: assist from husband Comments: Husband drives her to and from HD   PT Goals (current goals can now be found in the care plan section) Acute Rehab PT Goals Patient Stated Goal: Wants to get home as soon as she can. PT Goal Formulation: With patient Time For Goal Achievement: 07/04/18 Potential to Achieve Goals: Good Progress towards PT goals: Progressing toward goals    Frequency    Min 3X/week      PT Plan Current plan remains appropriate    Co-evaluation PT/OT/SLP Co-Evaluation/Treatment: Yes Reason for Co-Treatment: For patient/therapist safety PT goals addressed during session: Mobility/safety with mobility OT goals addressed during session: ADL's and self-care;Proper use of Adaptive equipment and DME;Strengthening/ROM      AM-PAC PT "6 Clicks" Mobility   Outcome Measure  Help needed turning from your back to your side while in a flat bed without using bedrails?: A Lot Help needed moving from lying on your back to sitting on the side of a flat bed without using bedrails?: A Lot Help needed moving to and from a bed to a chair (including a wheelchair)?: A Lot Help needed standing up from a chair using your arms (e.g., wheelchair or bedside chair)?: A Lot Help needed to walk in hospital room?: Total Help needed climbing 3-5 steps with a railing? : Total 6 Click Score: 10    End of Session   Activity Tolerance: Patient tolerated treatment well Patient left: in chair;with call bell/phone within reach Nurse Communication: Mobility status PT Visit Diagnosis: Other abnormalities of gait and mobility (R26.89);Muscle weakness (generalized) (M62.81);Difficulty in walking, not elsewhere classified  (R26.2);Unsteadiness on feet (R26.81)     Time: 8264-1583 PT Time Calculation (min) (ACUTE ONLY): 29 min  Charges:  $Therapeutic Activity: 8-22 mins                     06/23/2018  Donnella Sham, PT Acute Rehabilitation Services [phone]  (pager) [phone]  (office)   Tessie Fass [place] 06/23/2018, 3:58 PM

## 2018-06-23 NOTE — Progress Notes (Signed)
Patient ID: LAURALI GODDARD, female   DOB: 19-May-1945, 73 y.o.   MRN: [id number] [facility] Progress Note   Assessment/ Plan:   1.  Atrial fibrillation with rapid ventricular response:  per cardiology; metoprolol and amio.  For future elective cardioversion per cardiology.  Note that hypotension has limited UF with HD in the past and HR running 100-130's with HD.  2.  End-stage renal disease: HD is per TTS schedule - Normally at [facility] - HD 4/28: start 3K, UF 1-2L as able SBP > 90.  Midodrine 10mg  pre HD and prn at half way point, no heparin  3. Pleural effusion with pseudomonal empyema - s/p VATS.  With chest tube. CV Surgery and RCID follo9wing.  ON ceftazidime  4.  Hyponatremia: improved with HD/UF, advised to limit intake.   5. 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  aranesp given 4/24 q Sat.  High ferritin, no IV Fe at this time  5.  Secondary hyperparathyroidism: We will follow calcium/phosphorus levels. Stable without binder  6.  Protein calorie malnutrition: Significant hypoalbuminemia noted most possibly arising from nonhealing leg ulcer/acute phase reactant.  Continue renal vitamin/ONS.  7.  Hallucinations: off of Lomotil and metronidazole   Subjective:    Feels week  [place]illi with R chest tube   Objective:   BP (!) 83/54 (BP Location: Right Arm)   Pulse 99   Temp (!) 97.5 F (36.4 C) (Oral)   Resp 14   Ht 5\' 4"  (1.626 m)   Wt 51.5 kg   SpO2 98%   BMI 19.49 kg/m   Physical Exam: Gen: adult female in bed in NAD  CVS: irregularly irregular; no rub; tachy Resp: right chest tube; right lung reduced breath sounds; left lung clear; unlabored Abd: Soft, flat, nontender Ext: no lower extremity edema; right arm less swollen Access: right AVF with bruit and thrill   Labs: BMET Recent Labs  Lab 06/17/18 2121 06/18/18 0403 06/19/18 0620 06/20/18 0500 06/21/18 0812 06/22/18 0531  NA 137 138 135 135 134* 136  K 4.6 4.2 4.9 3.8 3.9 4.3  CL  98 98 99 95* 99 97*  CO2 25 25 24 27 25 28   GLUCOSE 118* 102* 137* 108* 124* 150*  BUN 14 16 25* 15 23 9   CREATININE 2.53* 2.82* 3.55* 2.54* 3.37* 2.10*  CALCIUM 7.9* 8.1* 7.8* 7.9* 7.5* 7.9*  PHOS 2.5 2.9  --  2.9 3.6 2.3*   CBC Recent Labs  Lab 06/19/18 0620 06/20/18 0500 06/21/18 0812 06/23/18 0247  WBC 11.0* 10.5 7.4 6.7  HGB 9.1* 8.9* 8.5* 8.8*  HCT 29.9* 29.6* 28.0* 29.4*  MCV 86.4 87.3 87.0 88.0  PLT 130* 131* 114* 94*   Medications:    . amiodarone  200 mg Oral BID  . atorvastatin  20 mg Oral QHS  . Chlorhexidine Gluconate Cloth  6 each Topical Q0600  . collagenase   Topical Daily  . [START ON 06/28/2018] darbepoetin (ARANESP) injection - DIALYSIS  40 mcg Intravenous Q Sat-HD  . enoxaparin (LOVENOX) injection  30 mg Subcutaneous Q24H  . feeding supplement (NEPRO CARB STEADY)  237 mL Oral BID BM  . Gerhardt's butt cream   Topical QID  . insulin aspart  0-24 Units Subcutaneous TID AC & HS  . metoprolol tartrate  25 mg Oral BID  . multivitamin  1 tablet Oral QHS  . warfarin  1 mg Oral ONCE-1800  . Warfarin - Pharmacist Dosing Inpatient   Does  not apply q1800    Rexene Agent 06/23/2018

## 2018-06-23 NOTE — Progress Notes (Addendum)
[address]       [employer] [address]             [phone]      5 Days Post-Op Procedure(s) (LRB): VIDEO ASSISTED THORACOSCOPY (VATS)/ DRAINAGE OF EMPYEMA (Right) Subjective: Feels poorly , weak, nauseated and dizzy  Objective: Vital signs in last 24 hours: Temp:  [97.5 F (36.4 C)-97.9 F (36.6 C)] 97.5 F (36.4 C) (04/27 0717) Pulse Rate:  [71-103] 99 (04/27 0717) Cardiac Rhythm: Atrial fibrillation (04/27 0427) Resp:  [13-27] 14 (04/27 0427) BP: (83-119)/(54-88) 83/54 (04/27 0717) SpO2:  [97 %-100 %] 98 % (04/27 0717) Weight:  [51.5 kg] 51.5 kg (04/27 0427)  Hemodynamic parameters for last 24 hours:    Intake/Output from previous day: 04/26 0701 - 04/27 0700 In: 580 [P.O.:580] Out: 50 [Chest Tube:50] Intake/Output this shift: No intake/output data recorded.  General appearance: alert, cooperative, fatigued and no distress Heart: irregularly irregular rhythm Lungs: dim on right Abdomen: nontender Extremities: no edema Wound: dressings CDI  Lab Results: Recent Labs    06/21/18 0812 06/23/18 0247  WBC 7.4 6.7  HGB 8.5* 8.8*  HCT 28.0* 29.4*  PLT 114* 94*   BMET:  Recent Labs    06/21/18 0812 06/22/18 0531  NA 134* 136  K 3.9 4.3  CL 99 97*  CO2 25 28  GLUCOSE 124* 150*  BUN 23 9  CREATININE 3.37* 2.10*  CALCIUM 7.5* 7.9*    PT/INR:  Recent Labs    06/23/18 0247  LABPROT 23.4*  INR 2.1*   ABG    Component Value Date/Time   PHART 7.427 06/19/2018 0350   HCO3 24.4 06/19/2018 0350   TCO2 27 12/03/2016 0751   ACIDBASEDEF 9.0 (H) 01/23/2009 0429   O2SAT 98.8 06/19/2018 0350   CBG (last 3)  Recent Labs    06/22/18 1608 06/22/18 2122 06/23/18 0559  GLUCAP 203* 170* 118*    Meds Scheduled Meds: . amiodarone  200 mg Oral BID  . atorvastatin  20 mg Oral QHS  . Chlorhexidine Gluconate Cloth  6 each Topical Q0600  . collagenase   Topical Daily  . [START ON 06/28/2018] darbepoetin (ARANESP) injection -  DIALYSIS  40 mcg Intravenous Q Sat-HD  . enoxaparin (LOVENOX) injection  30 mg Subcutaneous Q24H  . feeding supplement (NEPRO CARB STEADY)  237 mL Oral BID BM  . Gerhardt's butt cream   Topical QID  . insulin aspart  0-24 Units Subcutaneous TID AC & HS  . metoprolol tartrate  25 mg Oral BID  . multivitamin  1 tablet Oral QHS  . warfarin  1 mg Oral ONCE-1800  . Warfarin - Pharmacist Dosing Inpatient   Does not apply q1800   Continuous Infusions: . [START ON 06/24/2018] cefTAZidime (FORTAZ)  IV     PRN Meds:.acetaminophen **OR** acetaminophen, acetaminophen, antiseptic oral rinse, bisacodyl, loperamide, morphine injection, ondansetron (ZOFRAN) IV, senna-docusate, traMADol  Xrays Dg Chest Port 1 View  Result Date: 06/23/2018 CLINICAL DATA:  Chest tube EXAM: PORTABLE CHEST 1 VIEW COMPARISON:  06/22/2018 FINDINGS: Stable right chest tube and suspected right loculated pleural effusion. No pneumothorax consolidation in the right mid lung zone has increased. Vascular congestion is stable. Small left pleural effusion and hazy opacity at the left base is not significantly changed. IMPRESSION: Stable right chest tube without pneumothorax. Consolidation in the right mid lung zone has increased. Small left pleural effusion and hazy opacity at the left base is stable. Vascular congestion is stable. Electronically  Signed   By: Marybelle Killings M.D.   On: 06/23/2018 08:06   Dg Chest Port 1 View  Result Date: 06/22/2018 CLINICAL DATA:  Empyema EXAM: PORTABLE CHEST 1 VIEW COMPARISON:  06/21/2018, 06/20/2018, 06/19/2018, CT 06/15/2018, shoulder radiograph 03/27/2018 FINDINGS: Right-sided central venous catheter has been removed. A right lower chest tube remains in place. Appearance of decreased loculated air collection at the right cardio phrenic sulcus. Similar appearance of residual right pleural thickening. Small left-sided pleural effusion. No change dense airspace disease left base and lingula. Stable enlarged  cardiomediastinal silhouette. 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 IMPRESSION: 1. Removal of right-sided central venous catheter 2. No significant interval change in residual loculated right-sided pleural thickening. 3. No change in small left effusion and left basilar airspace disease. 4. Cardiomegaly. Electronically Signed   By: Donavan Foil M.D.   On: 06/22/2018 15:17   Results for orders placed or performed during the hospital encounter of 06/11/18  MRSA PCR Screening     Status: None   Collection Time: 06/11/18  6:23 AM  Result Value Ref Range Status   MRSA by PCR NEGATIVE NEGATIVE Final    Comment:        The GeneXpert MRSA Assay (FDA approved for NASAL specimens only), is one component of a comprehensive MRSA colonization surveillance program. It is not intended to diagnose MRSA infection nor to guide or monitor treatment for MRSA infections. Performed at [facility], [place] [address], [place], [place] [address]   Gram stain     Status: None   Collection Time: 06/13/18  5:04 PM  Result Value Ref Range Status   Specimen Description FLUID RIGHT PLEURAL  Final   Special Requests NONE  Final   Gram Stain   Final    ABUNDANT WBC PRESENT,BOTH PMN AND MONONUCLEAR NO ORGANISMS SEEN Performed at [facility], [address], [place], [place] [address]    Report Status 06/13/2018 FINAL  Final  Acid Fast Smear (AFB)     Status: None   Collection Time: 06/13/18  5:04 PM  Result Value Ref Range Status   AFB Specimen Processing Concentration  Final   Acid Fast Smear Negative  Final    Comment: (NOTE) Performed At: [facility] [place], [place] [id number] Rush Farmer MD [id number]    Source (AFB) FLUID  Final    Comment: PLEURAL RIGHT Performed at [facility], [place] [address] [place], [place] [address]   Culture, body fluid-bottle     Status: Abnormal   Collection Time: 06/13/18  5:04 PM  Result Value Ref Range  Status   Specimen Description FLUID RIGHT PLEURAL  Final   Special Requests BOTTLES DRAWN AEROBIC AND ANAEROBIC  Final   Gram Stain   Final    GRAM NEGATIVE RODS AEROBIC BOTTLE ONLY CRITICAL RESULT CALLED TO, READ BACK BY AND VERIFIED WITH: E. MONGE, RN CONCERNING GRAM STAIN AT 3244 ON 06/14/18 BY C. JESSUP, MLT Performed at [facility], [place] [address], [place], [place] [address]    Culture PSEUDOMONAS AERUGINOSA (A)  Final   Report Status 06/17/2018 FINAL  Final   Organism ID, Bacteria PSEUDOMONAS AERUGINOSA  Final      Susceptibility   Pseudomonas aeruginosa - MIC*    CEFTAZIDIME 4 SENSITIVE Sensitive     CIPROFLOXACIN <=0.25 SENSITIVE Sensitive     GENTAMICIN <=1 SENSITIVE Sensitive     IMIPENEM 2 SENSITIVE Sensitive     PIP/TAZO  16 SENSITIVE Sensitive     CEFEPIME <=1 SENSITIVE Sensitive     * PSEUDOMONAS AERUGINOSA  Surgical pcr screen     Status: None   Collection Time: 06/17/18  9:47 PM  Result Value Ref Range Status   MRSA, PCR NEGATIVE NEGATIVE Final   Staphylococcus aureus NEGATIVE NEGATIVE Final    Comment: (NOTE) The Xpert SA Assay (FDA approved for NASAL specimens in patients 15 years of age and older), is one component of a comprehensive surveillance program. It is not intended to diagnose infection nor to guide or monitor treatment. Performed at [facility], [place] [address], [place], [place] [address]    Assessment/Plan: S/P Procedure(s) (LRB): VIDEO ASSISTED THORACOSCOPY (VATS)/ DRAINAGE OF EMPYEMA (Right)  1 afebrile without leukocytosis, some increase appearance of consolidation on CXR. 30 cc drainage yesterday and 50 cc today, hemo-purulent appearance Conts fortaz for pseudomonas 2 BP low at times, renal issues as per nephrology, she does appear to be orthostatic. Dialysis has been a challenge d/t this 3 sats good on RA 4 afib with rate controlled mostly- as per cardiol, on coumadin for [facility] RX 5 medical management per primary and consultants   LOS: 11 days    John Giovanni [facility] 06/23/2018 Pager [phone]   I have seen and examined the patient and agree with the assessment and plan as outlined.  Minimal chest tube output last 48 hours.  Will d/c tube.  Patient has chronically trapped right lung and will remain at high risk for recurrent pneumonia and/or empyema.  Prognosis poor.  Rexene Alberts, MD 06/23/2018 8:59 AM

## 2018-06-23 NOTE — Telephone Encounter (Signed)
New Message   Patient has a TOC appt scheduled with Leonia Reader 07/10/2018.

## 2018-06-23 NOTE — Progress Notes (Signed)
PROGRESS NOTE    Vanessa Webb  GYI:[id number] DOB: [birth year] DOA: 06/11/2018 PCP: Monico Blitz, MD   Brief Narrative: 73 year old female with history of ESRD on HD TTS, hypertension, hyperlipidemia, A. fib on Coumadin for anticoagulation and chronic right leg ulcer transferred from [facility] for hemodialysis. Patient presented to [facility] with 2 weeks of generalized weakness and found to be in A. fib with RVR. She was placed on diltiazem drip transition to amiodarone prior to transfer. Patient is dialysis on 4/14 due to weakness. Reports good compliance with her metoprolol.  Of note, patient has had poor appetite, generalized weakness and about 40 pound weight loss over the last 1 year. She fell and broke her right hip 6 months ago and underwent IM nailing. She also fell and broke her left shoulder 3 months ago on the way to dialysis that has been treated conservatively.  Patient remained in A. fib with RVR on amiodarone drip. Initially, plan for DCCV if heart rate is not under good control. This has changed after talking to CTS about the pleural effusion and noted to have Large right-sided pleural effusion  on chest x-ray and CT chest. These appear to be chronic. She had US-guided diagnostic and therapeutic right thoracocentesis on 4/17 that yielded 300 cc exudative fluid by LDH. Pleural fluid Gram stain and culture with GNR. AFB smear negative. AFB culture and cytology pending. Started on Unasyn and Flagyl. Cardiothoracic surgery consulted on 4/19 recommended stopping warfarin, INR reversal and chest tube placement by IR. Pleural fluid culture with Pseudomonas aeruginosa and transitioned to cefepime on 06/16/2018. 4/20: IR chest tube placement and Gram stain negative, cytology consistent with acute inflammation. 4/22: VATS.  Subjective: On edge of the bed. Felt mildly lightheaded this am, bp soft 80s but came up to 120 in late morning. HR stable. Not eating well. only few bites. CT tube  in place, on RA. 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 Now on ceftaz 4/26 to facilitate dosing with HD-advises 4 weeks of antibiotics from the time of her decortication,  tentative ANTIBIOTIC D/C date 07/13/18.  clincally afebrile,leukocytosis resolved.  Follow-up chest x-ray no pneumo, consolidation in the RML increase Small left pleural effusion and hazy opacity at the left base is stable.  Remaining chest tube today as per CT surgery, given trapped lung patient is at risk for pneumonia and empyema (poor prognosis)  Recent Labs  Lab 06/18/18 0403 06/19/18 0620 06/20/18 0500 06/21/18 0812 06/23/18 0247  WBC 9.2 11.0* 10.5 7.4 6.7    Persistent atrial fibrillation : rate controlled. Held metoprolol last night due to low bp. Tsh nl.rate has been difficult to control, cardiology plans to load her with amiodarone, attempt cardioversion 3 weeks after she maintains therapeutic INR, continue metoprolol difficult to increase due to hypotension. Cont warfarin, pharmacy dosing, INR 21. Appreciate cardiology inputs. Recent Labs  Lab 06/19/18 0620 06/20/18 0500 06/21/18 0613 06/22/18 0531 06/23/18 0247  INR 1.6* 1.4* 1.4* 1.4* 2.1*   ESRD  On HD TTS w anemia of chronic disease/mineral bone disease: nephrology on board for dialysis. last HD 4/24: bp was in 70s-albumin given, again difficulty with ultrafiltration due to hypotension A. fib tachycardia.  Hypotension: Blood pressure has been soft especially in dialysis, patient is now on midodrine predialysis and prn. Monitor.  Hyponatremia improved with dialysis.  Protein  calorie malnutrition severe, with hypoalbuminemia, augment nutrition.  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  Hallucination/Intermittent confusion as per the husband:  Stable.She is off of Lomotil and Flagyl.  Type 2 DM, hba1c 5.9 4/17, well controlled, monitor CBG.  Pressure injury unstageable , RLE wound followed by wound care outpatient, POA,  12cm x 1.5cm x 0.2cm; 90% black soft eschar-Medial 4cm x 2.0cm x 0.2cm; 50% black soft eschar.present on admission, wound on left leg present on admission.cont wound care Cont wound care  Stage  II Sacral decubitus0.4cm x 0.2cm x 0.1cm; POA.  Scabbed without drainage.  Cont wound care  Hyperlipidemia:on lipitor.  Pulmonary nodule/mild right paratracheal lymphadenopathy 8 mm LLL pulmonary nodule seen on CT chest 8 follow-up in 3 to 6 months with CT chest.  Pleural fluid is negative for malignancy.  Recurrent fal/hx of right hip fractures, comminuted left proximal humerus fracture: status post IM nail of hip 6 months ago, left shoulder managed conservatively, continue pain control, PT OT  DVT prophylaxis: SCD/ warfarin Code Status: DNR Family Communication:Discussed plan of care with the patient in detail and she verbalized understanding.I had updated husband over the phone 4/24 and called again 4/26 in both cell and home no- unable to reach her.I contacted husband and updated in detail.  Disposition Plan: remains inpatient pending clinical improvement.CIR consulted.Cont pt/ot.  Consultants:   CT Surgery  IR  Nephrology  Cardiology   Procedures:  HD on 4/16  IR thoracocentesis on 4/17  Chest tube placement on 4/20  VATS 4/22 Central line 4/22- removed 4/25  Antimicrobials: Anti-infectives (From admission, onward)   Start     Dose/Rate Route Frequency Ordered Stop   06/24/18 1800  cefTAZidime (FORTAZ) 2 g in sodium chloride 0.9 % 100 mL IVPB     2 g 200 mL/hr over 30 Minutes Intravenous Every T-Th-Sa  (1800) 06/22/18 1046 07/13/18 2359   06/21/18 1800  ceFEPIme (MAXIPIME) 2 g in sodium chloride 0.9 % 100 mL IVPB  Status:  Discontinued     2 g 200 mL/hr over 30 Minutes Intravenous Every T-Th-Sa (1800) 06/21/18 0927 06/22/18 1020   06/18/18 0913  vancomycin (VANCOCIN) powder  Status:  Discontinued       As needed 06/18/18 0914 06/18/18 0958   06/18/18 0600  vancomycin (VANCOCIN) IVPB 1000 mg/200 mL premix     1,000 mg 200 mL/hr over 60 Minutes Intravenous On call to O.R. 06/17/18 1443 06/18/18 0908   06/17/18 1200  ceFEPIme (MAXIPIME) 2 g in sodium chloride 0.9 % 100 mL IVPB  Status:  Discontinued     2 g 200 mL/hr over 30 Minutes Intravenous Every T-Th-Sa (Hemodialysis) 06/16/18 1001 06/21/18 0927   06/16/18 1100  ceFEPIme (MAXIPIME) 2 g in sodium chloride 0.9 % 100 mL IVPB     2 g 200 mL/hr over 30 Minutes Intravenous  Once 06/16/18 1001 06/16/18 1900   06/15/18 1130  metroNIDAZOLE (FLAGYL) IVPB 500 mg  Status:  Discontinued     500 mg 100 mL/hr over 60 Minutes Intravenous Every 8 hours 06/15/18 1033 06/16/18 1001   06/15/18 1100  Ampicillin-Sulbactam (UNASYN) 3 g in sodium chloride 0.9 % 100 mL IVPB  Status:  Discontinued     3 g 200 mL/hr over 30 Minutes Intravenous  Every 12 hours 06/15/18 1051 06/16/18 1001   06/14/18 1600  cefTRIAXone (ROCEPHIN) 2 g in sodium chloride 0.9 % 100 mL IVPB  Status:  Discontinued     2 g 200 mL/hr over 30 Minutes Intravenous Every 24 hours 06/14/18 1508 06/15/18 1033       Objective: Vitals:   06/22/18 2300 06/23/18 0000 06/23/18 0427 06/23/18 0717  BP: 92/60  119/88 (!) 83/54  Pulse: 71  75 99  Resp: 13 (!) 22 14   Temp: 97.8 F (36.6 C)  (!) 97.5 F (36.4 C) (!) 97.5 F (36.4 C)  TempSrc: Oral  Oral Oral  SpO2: 100%  100% 98%  Weight:   51.5 kg   Height:        Intake/Output Summary (Last 24 hours) at 06/23/2018 0813 Last data filed at 06/23/2018 0554 Gross per 24 hour  Intake 580 ml  Output 50 ml  Net 530 ml   Filed Weights    06/21/18 1151 06/22/18 0300 06/23/18 0427  Weight: 52.5 kg 51.8 kg 51.5 kg   Weight change: -1.6 kg  Body mass index is 19.49 kg/m.  Intake/Output from previous day: 04/26 0701 - 04/27 0700 In: 580 [P.O.:580] Out: 50 [Chest Tube:50] Intake/Output this shift: No intake/output data recorded.  Examination: General exam: old, frail, not in acute distress. HEENT:Oral mucosa moist, Ear/Nose WNL grossly, dentition normal. Respiratory system: Bilateral diminished air entry at base, no use of accessory muscle, non tender on palpation. Chest tube in rt lung Cardiovascular system: irregular rate and rhythm, S1 & S2 heard, No JVD/murmurs. Gastrointestinal system: Abdomen soft, non-tender, non-distended, BS +.No hepatosplenomegaly palpable. Nervous System:Alert, awake and oriented at baseline. Able to move UE and LE, sensation intact. Extremities: No edema, distal peripheral pulses palpable.  Skin: No rashes,no icterus. MSK: Normal muscle bulk,tone, power  Medications:  Scheduled Meds: . amiodarone  200 mg Oral BID  . atorvastatin  20 mg Oral QHS  . Chlorhexidine Gluconate Cloth  6 each Topical Q0600  . collagenase   Topical Daily  . [START ON 06/28/2018] darbepoetin (ARANESP) injection - DIALYSIS  40 mcg Intravenous Q Sat-HD  . enoxaparin (LOVENOX) injection  30 mg Subcutaneous Q24H  . feeding supplement (NEPRO CARB STEADY)  237 mL Oral BID BM  . Gerhardt's butt cream   Topical QID  . insulin aspart  0-24 Units Subcutaneous TID AC & HS  . metoprolol tartrate  25 mg Oral BID  . multivitamin  1 tablet Oral QHS  . warfarin  1 mg Oral ONCE-1800  . Warfarin - Pharmacist Dosing Inpatient   Does not apply q1800   Continuous Infusions: . [START ON 06/24/2018] cefTAZidime (FORTAZ)  IV      Data Reviewed: I have personally reviewed following labs and imaging studies  CBC: Recent Labs  Lab 06/18/18 0403 06/19/18 0620 06/20/18 0500 06/21/18 0812 06/23/18 0247  WBC 9.2 11.0* 10.5 7.4 6.7   HGB 9.7* 9.1* 8.9* 8.5* 8.8*  HCT 30.8* 29.9* 29.6* 28.0* 29.4*  MCV 83.5 86.4 87.3 87.0 88.0  PLT 112* 130* 131* 114* 94*   Basic Metabolic Panel: Recent Labs  Lab 06/17/18 2121 06/18/18 0403 06/19/18 0620 06/20/18 0500 06/21/18 0613 06/21/18 0812 06/22/18 0531  NA 137 138 135 135  --  134* 136  K 4.6 4.2 4.9 3.8  --  3.9 4.3  CL 98 98 99 95*  --  99 97*  CO2 25 25 24 27   --  25 28  GLUCOSE 118* 102* 137* 108*  --  124* 150*  BUN 14 16 25* 15  --  23 9  CREATININE 2.53* 2.82* 3.55* 2.54*  --  3.37* 2.10*  CALCIUM 7.9* 8.1* 7.8* 7.9*  --  7.5* 7.9*  MG  --  1.8 1.9 1.9 2.1  --  1.9  PHOS 2.5 2.9  --  2.9  --  3.6 2.3*   GFR: Estimated Creatinine Clearance: 19.1 mL/min (A) (by C-G formula based on SCr of 2.1 mg/dL (H)). Liver Function Tests: Recent Labs  Lab 06/17/18 2121 06/18/18 0403 06/20/18 0500 06/21/18 0812 06/22/18 0531  AST  --   --  20  --   --   ALT  --   --  9  --   --   ALKPHOS  --   --  65  --   --   BILITOT  --   --  0.7  --   --   PROT  --   --  5.3*  --   --   ALBUMIN 1.5* 1.6* 1.6* 1.6* 2.1*   No results for input(s): LIPASE, AMYLASE in the last 168 hours. No results for input(s): AMMONIA in the last 168 hours. Coagulation Profile: Recent Labs  Lab 06/19/18 0620 06/20/18 0500 06/21/18 0613 06/22/18 0531 06/23/18 0247  INR 1.6* 1.4* 1.4* 1.4* 2.1*   Cardiac Enzymes: No results for input(s): CKTOTAL, CKMB, CKMBINDEX, TROPONINI in the last 168 hours. BNP (last 3 results) No results for input(s): PROBNP in the last 8760 hours. HbA1C: No results for input(s): HGBA1C in the last 72 hours. CBG: Recent Labs  Lab 06/22/18 0623 06/22/18 1114 06/22/18 1608 06/22/18 2122 06/23/18 0559  GLUCAP 141* 137* 203* 170* 118*   Lipid Profile: No results for input(s): CHOL, HDL, LDLCALC, TRIG, CHOLHDL, LDLDIRECT in the last 72 hours. Thyroid Function Tests: No results for input(s): TSH, T4TOTAL, FREET4, T3FREE, THYROIDAB in the last 72 hours.  Anemia Panel: Recent Labs    06/22/18 0800  FERRITIN 1,014*  TIBC NOT CALCULATED  IRON 28   Sepsis Labs: No results for input(s): PROCALCITON, LATICACIDVEN in the last 168 hours.  Recent Results (from the past 240 hour(s))  Gram stain     Status: None   Collection Time: 06/13/18  5:04 PM  Result Value Ref Range Status   Specimen Description FLUID RIGHT PLEURAL  Final   Special Requests NONE  Final   Gram Stain   Final    ABUNDANT WBC PRESENT,BOTH PMN AND MONONUCLEAR NO ORGANISMS SEEN Performed at [facility], [address] [place], [place] [address]    Report Status 06/13/2018 FINAL  Final  Acid Fast Smear (AFB)     Status: None   Collection Time: 06/13/18  5:04 PM  Result Value Ref Range Status   AFB Specimen Processing Concentration  Final   Acid Fast Smear Negative  Final    Comment: (NOTE) Performed At: [facility] [place], [place] [id number] Rush Farmer MD [id number]    Source (AFB) FLUID  Final    Comment: PLEURAL RIGHT Performed at [facility], [place] [address], [place], [place] [address]   Culture, body fluid-bottle     Status: Abnormal   Collection Time: 06/13/18  5:04 PM  Result Value Ref Range Status   Specimen Description FLUID RIGHT PLEURAL  Final   Special Requests BOTTLES DRAWN AEROBIC AND ANAEROBIC  Final   Gram Stain   Final    GRAM NEGATIVE RODS AEROBIC BOTTLE ONLY CRITICAL RESULT CALLED  TO, READ BACK BY AND VERIFIED WITH: E. MONGE, RN CONCERNING GRAM STAIN AT 6203 ON 06/14/18 BY C. JESSUP, MLT Performed at [facility], [place] [address] [place], [place] [address]    Culture PSEUDOMONAS AERUGINOSA (A)  Final   Report Status 06/17/2018 FINAL  Final   Organism ID, Bacteria PSEUDOMONAS AERUGINOSA  Final      Susceptibility   Pseudomonas aeruginosa - MIC*    CEFTAZIDIME 4 SENSITIVE Sensitive     CIPROFLOXACIN <=0.25 SENSITIVE Sensitive     GENTAMICIN <=1 SENSITIVE Sensitive     IMIPENEM 2 SENSITIVE  Sensitive     PIP/TAZO 16 SENSITIVE Sensitive     CEFEPIME <=1 SENSITIVE Sensitive     * PSEUDOMONAS AERUGINOSA  Surgical pcr screen     Status: None   Collection Time: 06/17/18  9:47 PM  Result Value Ref Range Status   MRSA, PCR NEGATIVE NEGATIVE Final   Staphylococcus aureus NEGATIVE NEGATIVE Final    Comment: (NOTE) The Xpert SA Assay (FDA approved for NASAL specimens in patients 28 years of age and older), is one component of a comprehensive surveillance program. It is not intended to diagnose infection nor to guide or monitor treatment. Performed at [facility], [place] [address] [place], [place] [address]       Radiology Studies: Dg Chest Port 1 View  Result Date: 06/23/2018 CLINICAL DATA:  Chest tube EXAM: PORTABLE CHEST 1 VIEW COMPARISON:  06/22/2018 FINDINGS: Stable right chest tube and suspected right loculated pleural effusion. No pneumothorax consolidation in the right mid lung zone has increased. Vascular congestion is stable. Small left pleural effusion and hazy opacity at the left base is not significantly changed. IMPRESSION: Stable right chest tube without pneumothorax. Consolidation in the right mid lung zone has increased. Small left pleural effusion and hazy opacity at the left base is stable. Vascular congestion is stable. Electronically Signed   By: Marybelle Killings M.D.   On: 06/23/2018 08:06   Dg Chest Port 1 View  Result Date: 06/22/2018 CLINICAL DATA:  Empyema EXAM: PORTABLE CHEST 1 VIEW COMPARISON:  06/21/2018, 06/20/2018, 06/19/2018, CT 06/15/2018, shoulder radiograph 03/27/2018 FINDINGS: Right-sided central venous catheter has been removed. A right lower chest tube remains in place. Appearance of decreased loculated air collection at the right cardio phrenic sulcus. Similar appearance of residual right pleural thickening. Small left-sided pleural effusion. No change dense airspace disease left base and lingula. Stable enlarged cardiomediastinal silhouette.  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 IMPRESSION: 1. Removal of right-sided central venous catheter 2. No significant interval change in residual loculated right-sided pleural thickening. 3. No change in small left effusion and left basilar airspace disease. 4. Cardiomegaly. Electronically Signed   By: Donavan Foil M.D.   On: 06/22/2018 15:17      LOS: 11 days   Time spent: More than 50% of that time was spent in counseling and/or coordination of care.  Antonieta Pert, MD [facility]  06/23/2018, 8:13 AM

## 2018-06-23 NOTE — Progress Notes (Signed)
Progress Note  Patient Name: Vanessa Webb Date of Encounter: 06/23/2018  Primary Cardiologist: Carlyle Dolly, MD   Subjective   Feeling a little bit weaker today, some mild nausea.  Still battling with transient hypotension.  He states that she is just not having a good day.  Inpatient Medications    Scheduled Meds: . amiodarone  200 mg Oral BID  . atorvastatin  20 mg Oral QHS  . Chlorhexidine Gluconate Cloth  6 each Topical Q0600  . collagenase   Topical Daily  . [START ON 06/28/2018] darbepoetin (ARANESP) injection - DIALYSIS  40 mcg Intravenous Q Sat-HD  . enoxaparin (LOVENOX) injection  30 mg Subcutaneous Q24H  . feeding supplement (NEPRO CARB STEADY)  237 mL Oral BID BM  . Gerhardt's butt cream   Topical QID  . insulin aspart  0-24 Units Subcutaneous TID AC & HS  . metoprolol tartrate  25 mg Oral BID  . multivitamin  1 tablet Oral QHS  . warfarin  1 mg Oral ONCE-1800  . Warfarin - Pharmacist Dosing Inpatient   Does not apply q1800   Continuous Infusions: . [START ON 06/24/2018] cefTAZidime (FORTAZ)  IV     PRN Meds: acetaminophen **OR** acetaminophen, acetaminophen, antiseptic oral rinse, bisacodyl, loperamide, morphine injection, ondansetron (ZOFRAN) IV, senna-docusate, traMADol   Vital Signs    Vitals:   06/22/18 2300 06/23/18 0000 06/23/18 0427 06/23/18 0717  BP: 92/60  119/88 (!) 83/54  Pulse: 71  75 99  Resp: 13 (!) 22 14   Temp: 97.8 F (36.6 C)  (!) 97.5 F (36.4 C) (!) 97.5 F (36.4 C)  TempSrc: Oral  Oral Oral  SpO2: 100%  100% 98%  Weight:   51.5 kg   Height:        Intake/Output Summary (Last 24 hours) at 06/23/2018 1017 Last data filed at 06/23/2018 6606 Gross per 24 hour  Intake 400 ml  Output 50 ml  Net 350 ml   Last 3 Weights 06/23/2018 06/22/2018 06/21/2018  Weight (lbs) 113 lb 8.6 oz 114 lb 3.2 oz 115 lb 11.9 oz  Weight (kg) 51.5 kg 51.8 kg 52.5 kg      Telemetry    80-110s atrial fibrillation- Personally Reviewed  ECG    A.  fib- Personally Reviewed  Physical Exam   General: Ill-appearing laying in bed, alert Lungs: Normal appearing respiratory rate, no increased effort. Skin: Dry, chest tube in place.   Labs    Chemistry Recent Labs  Lab 06/20/18 0500 06/21/18 0812 06/22/18 0531  NA 135 134* 136  K 3.8 3.9 4.3  CL 95* 99 97*  CO2 27 25 28   GLUCOSE 108* 124* 150*  BUN 15 23 9   CREATININE 2.54* 3.37* 2.10*  CALCIUM 7.9* 7.5* 7.9*  PROT 5.3*  --   --   ALBUMIN 1.6* 1.6* 2.1*  AST 20  --   --   ALT 9  --   --   ALKPHOS 65  --   --   BILITOT 0.7  --   --   GFRNONAA 18* 13* 23*  GFRAA 21* 15* 26*  ANIONGAP 13 10 11      Hematology Recent Labs  Lab 06/20/18 0500 06/21/18 0812 06/23/18 0247  WBC 10.5 7.4 6.7  RBC 3.39* 3.22* 3.34*  HGB 8.9* 8.5* 8.8*  HCT 29.6* 28.0* 29.4*  MCV 87.3 87.0 88.0  MCH 26.3 26.4 26.3  MCHC 30.1 30.4 29.9*  RDW 17.7* 17.4* 17.2*  PLT 131* 114* 94*  Cardiac EnzymesNo results for input(s): TROPONINI in the last 168 hours. No results for input(s): TROPIPOC in the last 168 hours.   BNPNo results for input(s): BNP, PROBNP in the last 168 hours.   DDimer No results for input(s): DDIMER in the last 168 hours.   Radiology    Dg Chest Port 1 View  Result Date: 06/23/2018 CLINICAL DATA:  Chest tube EXAM: PORTABLE CHEST 1 VIEW COMPARISON:  06/22/2018 FINDINGS: Stable right chest tube and suspected right loculated pleural effusion. No pneumothorax consolidation in the right mid lung zone has increased. Vascular congestion is stable. Small left pleural effusion and hazy opacity at the left base is not significantly changed. IMPRESSION: Stable right chest tube without pneumothorax. Consolidation in the right mid lung zone has increased. Small left pleural effusion and hazy opacity at the left base is stable. Vascular congestion is stable. Electronically Signed   By: Marybelle Killings M.D.   On: 06/23/2018 08:06   Dg Chest Port 1 View  Result Date: 06/22/2018 CLINICAL  DATA:  Empyema EXAM: PORTABLE CHEST 1 VIEW COMPARISON:  06/21/2018, 06/20/2018, 06/19/2018, CT 06/15/2018, shoulder radiograph 03/27/2018 FINDINGS: Right-sided central venous catheter has been removed. A right lower chest tube remains in place. Appearance of decreased loculated air collection at the right cardio phrenic sulcus. Similar appearance of residual right pleural thickening. Small left-sided pleural effusion. No change dense airspace disease left base and lingula. Stable enlarged cardiomediastinal silhouette. 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 IMPRESSION: 1. Removal of right-sided central venous catheter 2. No significant interval change in residual loculated right-sided pleural thickening. 3. No change in small left effusion and left basilar airspace disease. 4. Cardiomegaly. Electronically Signed   By: Donavan Foil M.D.   On: 06/22/2018 15:17    Cardiac Studies   Echo 06/11/2018: -EF 40 to 45%, mild elevated pulmonary pressures moderate mitral regurgitation  Patient Profile     73 y.o. female end-stage renal disease on hemodialysis with persistent atrial fibrillation difficult rate control with transient hypotension on Coumadin with empyema chest tube  Assessment & Plan    Persistent atrial fibrillation - Challenging to rate control, currently on amiodarone.  Difficult to increase beta-blocker because of hypotension especially during hemodialysis sessions. - Plan would be to continue to load her with the amiodarone and attempt cardioversion 3 weeks after she maintains therapeutic INR from 2-3. - Current rates have been in the 90s.  She is also been scheduled to get midodrine 10 mg pre-hemodialysis and PRN at halfway point by Dr. Pearson Grippe.  Excellent idea.  She has been noted in the past to have heart rates increasing to 100-130 during hemodialysis.  End-stage renal disease -Nephrology notes reviewed.  Hemodialysis can be challenging secondary to hypotension.   Seems to be an ongoing issue.  Empyema -  VATS, right empyema, chest tube.  This is also potentiating her hypotension.  Continuing with drainage, IV antibiotics.  Chest tube to be discontinued.  Prognosis remains poor.     For questions or updates, please contact [place] Please consult [email or web address] for contact info under        Signed, Candee Furbish, MD  06/23/2018, 10:17 AM

## 2018-06-24 ENCOUNTER — Inpatient Hospital Stay (HOSPITAL_COMMUNITY): Payer: Medicare Other

## 2018-06-24 LAB — RENAL FUNCTION PANEL
Albumin: 1.9 g/dL — ABNORMAL LOW (ref 3.5–5.0)
Anion gap: 11 (ref 5–15)
BUN: 20 mg/dL (ref 8–23)
CO2: 26 mmol/L (ref 22–32)
Calcium: 7.9 mg/dL — ABNORMAL LOW (ref 8.9–10.3)
Chloride: 98 mmol/L (ref 98–111)
Creatinine, Ser: 3.51 mg/dL — ABNORMAL HIGH (ref 0.44–1.00)
GFR calc Af Amer: 14 mL/min — ABNORMAL LOW (ref >60)
GFR calc non Af Amer: 12 mL/min — ABNORMAL LOW (ref >60)
Glucose, Bld: 74 mg/dL (ref 70–99)
Phosphorus: 3 mg/dL (ref 2.5–4.6)
Potassium: 4.5 mmol/L (ref 3.5–5.1)
Sodium: 135 mmol/L (ref 135–145)

## 2018-06-24 LAB — GLUCOSE, CAPILLARY
Glucose-Capillary: 81 mg/dL (ref 70–99)
Glucose-Capillary: 72 mg/dL (ref 70–99)
Glucose-Capillary: 233 mg/dL — ABNORMAL HIGH (ref 70–99)
Glucose-Capillary: 155 mg/dL — ABNORMAL HIGH (ref 70–99)

## 2018-06-24 LAB — PROTIME-INR
INR: 3 — ABNORMAL HIGH (ref 0.8–1.2)
Prothrombin Time: 30.7 seconds — ABNORMAL HIGH (ref 11.4–15.2)

## 2018-06-24 MED ORDER — WARFARIN 0.5 MG HALF TABLET
0.5000 mg | ORAL_TABLET | Freq: Once | ORAL | Status: DC
  Filled 2018-06-24: qty 1

## 2018-06-24 MED ORDER — PRO-STAT SUGAR FREE PO LIQD
30.0000 mL | Freq: Two times a day (BID) | ORAL | Status: DC
  Administered 2018-06-25 – 2018-06-27 (×4): 30 mL via ORAL
  Filled 2018-06-24 (×6): qty 30

## 2018-06-24 MED ORDER — BOOST / RESOURCE BREEZE PO LIQD CUSTOM
1.0000 | Freq: Three times a day (TID) | ORAL | Status: DC
  Administered 2018-06-24 – 2018-06-27 (×5): 1 via ORAL

## 2018-06-24 NOTE — Telephone Encounter (Signed)
I attempted to return call to husband at number listed, unable to complete call, no answer, no voicemail.

## 2018-06-24 NOTE — Progress Notes (Signed)
Patient ID: Vanessa Webb, female   DOB: 12/15/1945, 73 y.o.   MRN: [id number] [facility] Progress Note   Assessment/ Plan:   1.  Atrial fibrillation with rapid ventricular response:  per cardiology; metoprolol and amio.  For future elective cardioversion per cardiology.  Note that hypotension has limited UF with HD in the past and HR running 100-130's with HD.  2.  End-stage renal disease: HD is per TTS schedule - Normally at [facility] - HD 4/28: start 3K, UF 1-2L as able SBP > 90.  Midodrine 10mg  pre HD and prn at half way point, no heparin  3. Pleural effusion with pseudomonal empyema - s/p VATS.  chest tube removed 4/27. CV Surgery and RCID following.  ON ceftazidime  4.  Hyponatremia: improved with HD/UF, advised to limit intake.   5. 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  aranesp given 4/24 q Sat.  High ferritin, no IV Fe at this time  5.  Secondary hyperparathyroidism: follow calcium/phosphorus levels. Stable without binder  6.  Protein calorie malnutrition: Significant hypoalbuminemia noted most possibly arising from nonhealing leg ulcer/acute phase reactant.  Continue renal vitamin/ONS.  7.  Hallucinations: off of Lomotil and metronidazole   Subjective:    Chest tube removed  OT/PT rec CIR     Objective:   BP (!) 120/98 (BP Location: Left Arm)   Pulse (!) 111   Temp (!) 97.2 F (36.2 C) (Oral)   Resp (!) 24   Ht 5\' 4"  (1.626 m)   Wt 50.7 kg   SpO2 99%   BMI 19.19 kg/m   Physical Exam: Gen: NAD  CVS: irregularly irregular; no rub; normal rate Resp: right lung reduced breath sounds; left lung clear; unlabored Abd: Soft, flat, nontender Ext: no lower extremity edema; right arm less swollen Access: right AVF with bruit and thrill   Labs: BMET Recent Labs  Lab 06/17/18 2121 06/18/18 0403 06/19/18 0620 06/20/18 0500 06/21/18 0812 06/22/18 0531 06/24/18 0236  NA 137 138 135 135 134* 136 135  K 4.6 4.2 4.9 3.8 3.9 4.3 4.5  CL 98  98 99 95* 99 97* 98  CO2 25 25 24 27 25 28 26   GLUCOSE 118* 102* 137* 108* 124* 150* 74  BUN 14 16 25* 15 23 9 20   CREATININE 2.53* 2.82* 3.55* 2.54* 3.37* 2.10* 3.51*  CALCIUM 7.9* 8.1* 7.8* 7.9* 7.5* 7.9* 7.9*  PHOS 2.5 2.9  --  2.9 3.6 2.3* 3.0   CBC Recent Labs  Lab 06/19/18 0620 06/20/18 0500 06/21/18 0812 06/23/18 0247  WBC 11.0* 10.5 7.4 6.7  HGB 9.1* 8.9* 8.5* 8.8*  HCT 29.9* 29.6* 28.0* 29.4*  MCV 86.4 87.3 87.0 88.0  PLT 130* 131* 114* 94*   Medications:    . amiodarone  200 mg Oral BID  . atorvastatin  20 mg Oral QHS  . Chlorhexidine Gluconate Cloth  6 each Topical Q0600  . collagenase   Topical Daily  . [START ON 06/28/2018] darbepoetin (ARANESP) injection - DIALYSIS  40 mcg Intravenous Q Sat-HD  . feeding supplement (NEPRO CARB STEADY)  237 mL Oral BID BM  . Gerhardt's butt cream   Topical QID  . insulin aspart  0-24 Units Subcutaneous TID AC & HS  . metoprolol tartrate  25 mg Oral BID  . multivitamin  1 tablet Oral QHS  . Warfarin - Pharmacist Dosing Inpatient   Does not apply q1800    Rexene Agent 06/24/2018

## 2018-06-24 NOTE — Progress Notes (Signed)
PROGRESS NOTE    JILLANA SELPH  ZOX:[id number] DOB: 16-Dec-1945 DOA: 06/11/2018 PCP: Monico Blitz, MD   Brief Narrative: 73 year old female with history of ESRD on HD TTS, hypertension, hyperlipidemia, A. fib on Coumadin for anticoagulation and chronic right leg ulcer transferred from [facility] for hemodialysis. Patient presented to [facility] with 2 weeks of generalized weakness and found to be in A. fib with RVR. She was placed on diltiazem drip transition to amiodarone prior to transfer. Patient is dialysis on 4/14 due to weakness. Reports good compliance with her metoprolol.  Of note, patient has had poor appetite, generalized weakness and about 40 pound weight loss over the last 1 year. She fell and broke her right hip 6 months ago and underwent IM nailing. She also fell and broke her left shoulder 3 months ago on the way to dialysis that has been treated conservatively.  Patient remained in A. fib with RVR on amiodarone drip. Initially, plan for DCCV if heart rate is not under good control. This has changed after talking to CTS about the pleural effusion and noted to have Large right-sided pleural effusion  on chest x-ray and CT chest. These appear to be chronic. She had US-guided diagnostic and therapeutic right thoracocentesis on 4/17 that yielded 300 cc exudative fluid by LDH. Pleural fluid Gram stain and culture with GNR. AFB smear negative.Started on Unasyn and Flagyl. Cardiothoracic surgery consulted on 4/19 recommended stopping warfarin, INR reversal and chest tube placement by IR. Pleural fluid culture with Pseudomonas aeruginosa and transitioned to cefepime on 06/16/2018. 4/20: IR chest tube placement and Gram stain negative, cytology consistent with acute inflammation. 4/22: VATS. 4/26- abx changed to [place] 4/27- chest tube pulled out  Subjective: Seen and examined No new complaints.  Denies chest pain nausea vomiting.  On room air,now in Normal sinus rhythm HR STABLE ,blood  pressure stable.  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 Tube out 4/27. Cont ceftaz 4/26 with HD-for 4 weeks of antibiotics from the time of her decortication,  tentative antibiotic stop date 07/13/18.Clinically stable afebrile but given trapped lung patient is at risk for pneumonia and empyema (poor prognosis)   Persistent atrial fibrillation : Rate has been difficult to control and initial plan was Cardioversion after 3 weeks of therapeutic INR.She has had challenges especially  with hypotension and RVR IN 10-130S  during dialysis limiting ultrafiltration. Continue with amiodarone, metoprolol.  Appreciate cardiology input. INR 2.1->3.0-being dosed by pharmacy.  ESRD  On HD TTS w anemia of chronic disease/mineral bone disease: nephrology on board for dialysis. last HD 4/24: bp was in 70s-albumin  Was given, again difficulty with ultrafiltration due to hypotension A. fib tachycardia.per nephro.  Hypotension: BP seems stable now, started on Midodrine 10 mg pre HD, and prn halfway  Hyponatremia improved with dialysis.  Protein calorie malnutrition severe, with hypoalbuminemia. Is not eating well and only minimal intake- changed to regular diet to increase food option.  Again encouraged oral intake.  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  Hallucination/Intermittent confusion as per the husband:  Now stable.She is off of Lomotil and Flagyl.  Type 2 DM, hba1c 5.9 4/17, well controlled, monitor CBG.  Pressure injury unstageable , RLE wound followed by wound  care outpatient, POA,  12cm x 1.5cm x 0.2cm; 90% black soft eschar-Medial 4cm x 2.0cm x 0.2cm; 50% black soft eschar.present on admission, wound on left  leg present on admission.cont wound care Cont wound care  Stage  II Sacral decubitus0.4cm x 0.2cm x 0.1cm; POA.  Scabbed without drainage.  Cont wound care  Hyperlipidemia:on lipitor.  Pulmonary nodule/mild right paratracheal lymphadenopathy 8 mm LLL pulmonary nodule seen on CT chest 8 follow-up in 3 to 6 months with CT chest.  Pleural fluid is negative for malignancy.  Recurrent fal/hx of right hip fractures, comminuted left proximal humerus fracture: status post IM nail of hip 6 months ago, left shoulder managed conservatively, continue pain control, PT OT  DVT prophylaxis: SCD/ warfarin Code Status: DNR Family Communication:Discussed plan of care with the patient in detail and she verbalized understanding.I had updated husband over the phone multiple times including 4/27.he prefers CIR here  Disposition Plan: plan for CIR if okay with nephrology and cardiology, pending approval. HD today  Consultants:   CT Surgery  IR  Nephrology  Cardiology   Procedures:  HD on 4/16  IR thoracocentesis on 4/17  Chest tube placement on 4/20  VATS 4/22 Central line 4/22- removed 4/25  Antimicrobials: Anti-infectives (From admission, onward)   Start     Dose/Rate Route Frequency Ordered Stop   06/24/18 1800  cefTAZidime (FORTAZ) 2 g in sodium chloride 0.9 % 100 mL IVPB     2 g 200 mL/hr over 30 Minutes Intravenous Every T-Th-Sa (1800) 06/22/18 1046 07/13/18 2359   06/21/18 1800  ceFEPIme (MAXIPIME) 2 g in sodium chloride 0.9 % 100 mL IVPB  Status:  Discontinued     2 g 200 mL/hr over 30 Minutes Intravenous Every T-Th-Sa (1800) 06/21/18 0927 06/22/18 1020   06/18/18 0913  vancomycin (VANCOCIN) powder  Status:  Discontinued       As needed 06/18/18 0914 06/18/18 0958   06/18/18 0600  vancomycin (VANCOCIN) IVPB 1000 mg/200 mL premix     1,000 mg 200 mL/hr over 60 Minutes Intravenous On call to O.R. 06/17/18 1443 06/18/18 0908   06/17/18 1200  ceFEPIme (MAXIPIME) 2 g in sodium  chloride 0.9 % 100 mL IVPB  Status:  Discontinued     2 g 200 mL/hr over 30 Minutes Intravenous Every T-Th-Sa (Hemodialysis) 06/16/18 1001 06/21/18 0927   06/16/18 1100  ceFEPIme (MAXIPIME) 2 g in sodium chloride 0.9 % 100 mL IVPB     2 g 200 mL/hr over 30 Minutes Intravenous  Once 06/16/18 1001 06/16/18 1900   06/15/18 1130  metroNIDAZOLE (FLAGYL) IVPB 500 mg  Status:  Discontinued     500 mg 100 mL/hr over 60 Minutes Intravenous Every 8 hours 06/15/18 1033 06/16/18 1001   06/15/18 1100  Ampicillin-Sulbactam (UNASYN) 3 g in sodium chloride 0.9 % 100 mL IVPB  Status:  Discontinued     3 g 200 mL/hr over 30 Minutes Intravenous Every 12 hours 06/15/18 1051 06/16/18 1001   06/14/18 1600  cefTRIAXone (ROCEPHIN) 2 g in sodium chloride 0.9 % 100 mL IVPB  Status:  Discontinued     2 g 200 mL/hr over 30 Minutes Intravenous Every 24 hours 06/14/18 1508 06/15/18 1033       Objective: Vitals:   06/23/18 2343 06/24/18 0437 06/24/18 0757 06/24/18 0800  BP: 119/68 (!) 120/98    Pulse:  (!) 111    Resp: (!) 24 (!) 24    Temp: (!) 97.1 F (36.2 C) (!) 97.2 F (36.2  C) (!) 97.2 F (36.2 C) (!) 97.2 F (36.2 C)  TempSrc: Oral Oral Oral Oral  SpO2: 100% 99%    Weight:  50.7 kg    Height:        Intake/Output Summary (Last 24 hours) at 06/24/2018 0855 Last data filed at 06/23/2018 1340 Gross per 24 hour  Intake 118 ml  Output -  Net 118 ml   Filed Weights   06/22/18 0300 06/23/18 0427 06/24/18 0437  Weight: 51.8 kg 51.5 kg 50.7 kg   Weight change: -0.8 kg  Body mass index is 19.19 kg/m.  Intake/Output from previous day: 04/27 0701 - 04/28 0700 In: 178 [P.O.:178] Out: -  Intake/Output this shift: No intake/output data recorded.  Examination:  General exam: Calm, comfortable, thin and frail, not in distress.  HEENT:Oral mucosa moist, Ear/Nose WNL grossly, dentition normal. Respiratory system: Bilateral equal air entry, no crackles and wheezing, no use of accessory  muscle,Cardiovascular system: regular rate and rhythm, S1 & S2 heard, No JVD/murmurs. Gastrointestinal system: Abdomen soft, non-tender, non-distended, BS +. No hepatosplenomegaly palpable. Nervous System:Alert, awake and oriented at baseline. Able to move UE and LE, sensation intact. Extremities: No edema, distal peripheral pulses palpable.  Skin: No rashes,no icterus. Wound + on LE and sacrum. MSK: Normal muscle bulk,tone, power   Medications:  Scheduled Meds: . amiodarone  200 mg Oral BID  . atorvastatin  20 mg Oral QHS  . Chlorhexidine Gluconate Cloth  6 each Topical Q0600  . collagenase   Topical Daily  . [START ON 06/28/2018] darbepoetin (ARANESP) injection - DIALYSIS  40 mcg Intravenous Q Sat-HD  . feeding supplement (NEPRO CARB STEADY)  237 mL Oral BID BM  . Gerhardt's butt cream   Topical QID  . insulin aspart  0-24 Units Subcutaneous TID AC & HS  . metoprolol tartrate  25 mg Oral BID  . multivitamin  1 tablet Oral QHS  . Warfarin - Pharmacist Dosing Inpatient   Does not apply q1800   Continuous Infusions: . cefTAZidime (FORTAZ)  IV      Data Reviewed: I have personally reviewed following labs and imaging studies  CBC: Recent Labs  Lab 06/18/18 0403 06/19/18 0620 06/20/18 0500 06/21/18 0812 06/23/18 0247  WBC 9.2 11.0* 10.5 7.4 6.7  HGB 9.7* 9.1* 8.9* 8.5* 8.8*  HCT 30.8* 29.9* 29.6* 28.0* 29.4*  MCV 83.5 86.4 87.3 87.0 88.0  PLT 112* 130* 131* 114* 94*   Basic Metabolic Panel: Recent Labs  Lab 06/18/18 0403 06/19/18 0620 06/20/18 0500 06/21/18 0613 06/21/18 0812 06/22/18 0531 06/24/18 0236  NA 138 135 135  --  134* 136 135  K 4.2 4.9 3.8  --  3.9 4.3 4.5  CL 98 99 95*  --  99 97* 98  CO2 25 24 27   --  25 28 26   GLUCOSE 102* 137* 108*  --  124* 150* 74  BUN 16 25* 15  --  23 9 20   CREATININE 2.82* 3.55* 2.54*  --  3.37* 2.10* 3.51*  CALCIUM 8.1* 7.8* 7.9*  --  7.5* 7.9* 7.9*  MG 1.8 1.9 1.9 2.1  --  1.9  --   PHOS 2.9  --  2.9  --  3.6 2.3* 3.0    GFR: Estimated Creatinine Clearance: 11.3 mL/min (A) (by C-G formula based on SCr of 3.51 mg/dL (H)). Liver Function Tests: Recent Labs  Lab 06/18/18 0403 06/20/18 0500 06/21/18 0812 06/22/18 0531 06/24/18 0236  AST  --  20  --   --   --  ALT  --  9  --   --   --   ALKPHOS  --  65  --   --   --   BILITOT  --  0.7  --   --   --   PROT  --  5.3*  --   --   --   ALBUMIN 1.6* 1.6* 1.6* 2.1* 1.9*   No results for input(s): LIPASE, AMYLASE in the last 168 hours. No results for input(s): AMMONIA in the last 168 hours. Coagulation Profile: Recent Labs  Lab 06/20/18 0500 06/21/18 0613 06/22/18 0531 06/23/18 0247 06/24/18 0236  INR 1.4* 1.4* 1.4* 2.1* 3.0*   Cardiac Enzymes: No results for input(s): CKTOTAL, CKMB, CKMBINDEX, TROPONINI in the last 168 hours. BNP (last 3 results) No results for input(s): PROBNP in the last 8760 hours. HbA1C: No results for input(s): HGBA1C in the last 72 hours. CBG: Recent Labs  Lab 06/22/18 2122 06/23/18 0559 06/23/18 1134 06/23/18 1625 06/24/18 0617  GLUCAP 170* 118* 91 169* 81   Lipid Profile: No results for input(s): CHOL, HDL, LDLCALC, TRIG, CHOLHDL, LDLDIRECT in the last 72 hours. Thyroid Function Tests: No results for input(s): TSH, T4TOTAL, FREET4, T3FREE, THYROIDAB in the last 72 hours. Anemia Panel: Recent Labs    06/22/18 0800  FERRITIN 1,014*  TIBC NOT CALCULATED  IRON 28   Sepsis Labs: No results for input(s): PROCALCITON, LATICACIDVEN in the last 168 hours.  Recent Results (from the past 240 hour(s))  Surgical pcr screen     Status: None   Collection Time: 06/17/18  9:47 PM  Result Value Ref Range Status   MRSA, PCR NEGATIVE NEGATIVE Final   Staphylococcus aureus NEGATIVE NEGATIVE Final    Comment: (NOTE) The Xpert SA Assay (FDA approved for NASAL specimens in patients 71 years of age and older), is one component of a comprehensive surveillance program. It is not intended to diagnose infection nor to guide or  monitor treatment. Performed at [facility], [place] [address], [place], [place] [address]       Radiology Studies: Dg Chest Port 1 View  Result Date: 06/24/2018 CLINICAL DATA:  Trapped lung. EXAM: PORTABLE CHEST 1 VIEW COMPARISON:  June 23, 2018 FINDINGS: The right-sided chest tube has been removed. No pneumothorax. A small left pleural effusion is stable. Loculated effusion on the right with underlying opacity remains. The effusion and opacity at the right base are more prominent. Probable mild edema/pulmonary venous congestion. No other changes. IMPRESSION: 1. No pneumothorax after right chest tube removal. 2. Persistent loculated right pleural effusion with underlying opacity. The effusion and opacity at the right base is more prominent in the interval. 3. Small left effusion. 4. Mild pulmonary edema. Electronically Signed   By: Dorise Bullion III M.D   On: 06/24/2018 08:29   Dg Chest Port 1 View  Result Date: 06/23/2018 CLINICAL DATA:  Chest tube EXAM: PORTABLE CHEST 1 VIEW COMPARISON:  06/22/2018 FINDINGS: Stable right chest tube and suspected right loculated pleural effusion. No pneumothorax consolidation in the right mid lung zone has increased. Vascular congestion is stable. Small left pleural effusion and hazy opacity at the left base is not significantly changed. IMPRESSION: Stable right chest tube without pneumothorax. Consolidation in the right mid lung zone has increased. Small left pleural effusion and hazy opacity at the left base is stable. Vascular congestion is stable. Electronically Signed   By: Marybelle Killings M.D.   On: 06/23/2018 08:06   Dg Chest [facility] 1 [address]  Result Date: 06/22/2018 CLINICAL DATA:  Empyema EXAM: PORTABLE CHEST 1 VIEW COMPARISON:  06/21/2018, 06/20/2018, 06/19/2018, CT 06/15/2018, shoulder radiograph 03/27/2018 FINDINGS: Right-sided central venous catheter has been removed. A right lower chest tube remains in place. Appearance of decreased loculated air  collection at the right cardio phrenic sulcus. Similar appearance of residual right pleural thickening. Small left-sided pleural effusion. No change dense airspace disease left base and lingula. Stable enlarged cardiomediastinal silhouette. 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 IMPRESSION: 1. Removal of right-sided central venous catheter 2. No significant interval change in residual loculated right-sided pleural thickening. 3. No change in small left effusion and left basilar airspace disease. 4. Cardiomegaly. Electronically Signed   By: Donavan Foil M.D.   On: 06/22/2018 15:17      LOS: 12 days   Time spent: More than 50% of that time was spent in counseling and/or coordination of care.  Antonieta Pert, MD [facility]  06/24/2018, 8:55 AM

## 2018-06-24 NOTE — Progress Notes (Signed)
Physical Therapy Treatment Patient Details Name: Vanessa Webb MRN: [id number] DOB: 1945/04/29 Today's Date: 06/24/2018    History of Present Illness  Vanessa Webb is a 73 y.o. female with medical history significant of ESRD on TTS schedule, hypertension, hyperlipidemia, type 2 diabetes mellitus, on chronic anticoagulation with Coumadin presents to the hospital with chief complaint of generalized weakness.  Found to be in Afib with RVR.  CT showed large right sided pleural effusion.  pt s/p right thoracocentensis on 4/17.  Chest tube placed by IR 4/19.  Pt s/p VATS for empyema with pseudomonas 4/22.    PT Comments    Needs encouragement every time, but will participate when she understands the importance.  Emphasis on transitions to EOB and standing trials with pregait activity to work on strength and LE incoordination.    Follow Up Recommendations  CIR;Supervision/Assistance - 24 hour     Equipment Recommendations  Other (comment)(TBA)    Recommendations for Other Services Rehab consult     Precautions / Restrictions Precautions Precautions: Fall    Mobility  Bed Mobility Overal bed mobility: Needs Assistance Bed Mobility: Supine to Sit;Sit to Supine     Supine to sit: Mod assist Sit to supine: Mod assist   General bed mobility comments: More truncal assist to come forward up via right elbow than the need to assist the LE's into bed.  Scooting eo EOB in sitting with min guard.  Transfers Overall transfer level: Needs assistance Equipment used: Rolling walker (2 wheeled) Transfers: Sit to/from Stand Sit to Stand: Mod assist         General transfer comment: 3 trials of standing with cues for hand placemet, assist coming forward and stability for weak knees.  Ambulation/Gait             General Gait Details: pregait tasks with stepping 2 steps forward and back with stability assist and movement of the RW`   Stairs             Wheelchair Mobility     Modified Rankin (Stroke Patients Only)       Balance   Sitting-balance support: No upper extremity supported;Feet supported Sitting balance-Leahy Scale: Fair Sitting balance - Comments: able to maintain sitting without assist, but as she fatigued over 10 minutes she used UE's   Standing balance support: During functional activity;Bilateral upper extremity supported;Single extremity supported Standing balance-Leahy Scale: Poor Standing balance comment: pregait activity with stepping, w/shifting, low amplitude marching, backing up                            Cognition Arousal/Alertness: Awake/alert Behavior During Therapy: Flat affect Overall Cognitive Status: No family/caregiver present to determine baseline cognitive functioning Area of Impairment: Following commands;Problem solving                       Following Commands: Follows one step commands with increased time     Problem Solving: Slow processing;Requires verbal cues General Comments: slow processing, able to follow simple commands      Exercises Other Exercises Other Exercises: warm up ROM exercise prior to mobility  bil with graded resistance ~7 reps.    General Comments General comments (skin integrity, edema, etc.): vss      Pertinent Vitals/Pain Pain Assessment: Faces Faces Pain Scale: No hurt    Home Living  Prior Function            PT Goals (current goals can now be found in the care plan section) Acute Rehab PT Goals Patient Stated Goal: Wants to get home as soon as she can. PT Goal Formulation: With patient Time For Goal Achievement: 07/04/18 Potential to Achieve Goals: Good Progress towards PT goals: Progressing toward goals    Frequency    Min 3X/week      PT Plan Current plan remains appropriate    Co-evaluation              AM-PAC PT "6 Clicks" Mobility   Outcome Measure  Help needed turning from your back to your  side while in a flat bed without using bedrails?: A Lot Help needed moving from lying on your back to sitting on the side of a flat bed without using bedrails?: A Lot Help needed moving to and from a bed to a chair (including a wheelchair)?: A Lot Help needed standing up from a chair using your arms (e.g., wheelchair or bedside chair)?: A Lot Help needed to walk in hospital room?: Total Help needed climbing 3-5 steps with a railing? : Total 6 Click Score: 10    End of Session   Activity Tolerance: Patient tolerated treatment well Patient left: in bed;with call bell/phone within reach;Other (comment)(going to HD soon) Nurse Communication: Mobility status PT Visit Diagnosis: Other abnormalities of gait and mobility (R26.89);Muscle weakness (generalized) (M62.81);Difficulty in walking, not elsewhere classified (R26.2);Unsteadiness on feet (R26.81)     Time: 1430-1453 PT Time Calculation (min) (ACUTE ONLY): 23 min  Charges:  $Gait Training: 8-22 mins $Therapeutic Activity: 8-22 mins                     06/24/2018  Donnella Sham, PT Acute Rehabilitation Services [phone]  (pager) [phone]  (office)   Tessie Fass Aldric Wenzler 06/24/2018, 5:34 PM

## 2018-06-24 NOTE — Progress Notes (Signed)
Inpatient Rehabilitation-Admissions Coordinator   Inpatient Rehab Consult received.  I met with pt at the bedside for rehabilitation assessment and to discuss goals and expectations of an inpatient rehab admission. Pt unsure if she can tolerate the intensity of the program but would like to think about it tonight. Due to her persistent cardiac and nephology issues, will discuss with PM&R MD regarding appropriateness of CIR, given tolerance issues and poor prognosis per cardiology. Will follow up tomorrow.    Please call if questions.   Jhonnie Garner, OTR/L  Rehab Admissions Coordinator  [phone] 06/24/2018 4:59 PM

## 2018-06-24 NOTE — Telephone Encounter (Signed)
06/24/18-pt currently hospitalized/al

## 2018-06-24 NOTE — Telephone Encounter (Signed)
F/U Message           Patient's husband is returning a call, he states the message said they were calling about her discharge and according to her husband she not being discharge, she is suppose to be in the hospital for 2 more weeks. Pls call to advise.  [phone]

## 2018-06-24 NOTE — Progress Notes (Addendum)
6 Days Post-Op Procedure(s) (LRB): VIDEO ASSISTED THORACOSCOPY (VATS)/ DRAINAGE OF EMPYEMA (Right)   Subjective: Feels like she is having a better day today.  No new problems. She has noted no change in her respiratory effort.  Objective: Vital signs in last 24 hours: Temp:  [96.9 F (36.1 C)-97.7 F (36.5 C)] 97.2 F (36.2 C) (04/28 0757) Pulse Rate:  [97-112] 111 (04/28 0437) Cardiac Rhythm: Atrial fibrillation (04/27 2000) Resp:  [17-24] 24 (04/28 0437) BP: (102-121)/(54-98) 120/98 (04/28 0437) SpO2:  [99 %-100 %] 99 % (04/28 0437) Weight:  [50.7 kg] 50.7 kg (04/28 0437)    Intake/Output from previous day: 04/27 0701 - 04/28 0700 In: 178 [P.O.:178] Out: -  Intake/Output this shift: No intake/output data recorded.  General appearance: alert, cooperative and mild distress Neurologic: intact Heart: irregularly irregular rhythm Lungs: Diminished breath sounds, right greater than left. Chest tube removed yesterday. There is a clean, dry dressing over the insertion site.  Lab Results: Recent Labs    06/23/18 0247  WBC 6.7  HGB 8.8*  HCT 29.4*  PLT 94*   BMET:  Recent Labs    06/22/18 0531 06/24/18 0236  NA 136 135  K 4.3 4.5  CL 97* 98  CO2 28 26  GLUCOSE 150* 74  BUN 9 20  CREATININE 2.10* 3.51*  CALCIUM 7.9* 7.9*    PT/INR:  Recent Labs    06/24/18 0236  LABPROT 30.7*  INR 3.0*   ABG    Component Value Date/Time   PHART 7.427 06/19/2018 0350   HCO3 24.4 06/19/2018 0350   TCO2 27 12/03/2016 0751   ACIDBASEDEF 9.0 (H) 01/23/2009 0429   O2SAT 98.8 06/19/2018 0350   CBG (last 3)  Recent Labs    06/23/18 1134 06/23/18 1625 06/24/18 0617  GLUCAP 91 169* 81    Assessment/Plan: S/P Procedure(s) (LRB): VIDEO ASSISTED THORACOSCOPY (VATS)/ DRAINAGE OF EMPYEMA (Right)  -POD6 right VATS, drainage of empyema that is culture positive for pseudomonas.  CT removed yesterday after having minimal drainage for 48 hours. CXR stable but continues to show  significant consolidation in the right mid lung zone and small bilateral effusions. IV Tressie Ellis is continued per primary team. Work on pulmonary hygiene with flutter valve if she is able. Nothing further to add. Will follow peripherally.    LOS: 12 days    Antony Odea, PA-C [phone] 06/24/2018   I have seen and examined the patient and agree with the assessment and plan as outlined.  Plan per medical teams.  Please call if we can be of further assistance.  Rexene Alberts, MD 06/24/2018 9:43 AM

## 2018-06-24 NOTE — Progress Notes (Signed)
Progress Note  Patient Name: Vanessa Webb Date of Encounter: 06/24/2018  Primary Cardiologist: Carlyle Dolly, MD   Subjective   Better again today. No CP. Still weak  Inpatient Medications    Scheduled Meds: . amiodarone  200 mg Oral BID  . atorvastatin  20 mg Oral QHS  . Chlorhexidine Gluconate Cloth  6 each Topical Q0600  . collagenase   Topical Daily  . [START ON 06/28/2018] darbepoetin (ARANESP) injection - DIALYSIS  40 mcg Intravenous Q Sat-HD  . feeding supplement (NEPRO CARB STEADY)  237 mL Oral BID BM  . Gerhardt's butt cream   Topical QID  . insulin aspart  0-24 Units Subcutaneous TID AC & HS  . metoprolol tartrate  25 mg Oral BID  . multivitamin  1 tablet Oral QHS  . Warfarin - Pharmacist Dosing Inpatient   Does not apply q1800   Continuous Infusions: . cefTAZidime (FORTAZ)  IV     PRN Meds: acetaminophen **OR** acetaminophen, acetaminophen, antiseptic oral rinse, bisacodyl, loperamide, morphine injection, ondansetron (ZOFRAN) IV, senna-docusate, traMADol   Vital Signs    Vitals:   06/23/18 2343 06/24/18 0437 06/24/18 0757 06/24/18 0800  BP: 119/68 (!) 120/98    Pulse:  (!) 111    Resp: (!) 24 (!) 24    Temp: (!) 97.1 F (36.2 C) (!) 97.2 F (36.2 C) (!) 97.2 F (36.2 C) (!) 97.2 F (36.2 C)  TempSrc: Oral Oral Oral Oral  SpO2: 100% 99%    Weight:  50.7 kg    Height:        Intake/Output Summary (Last 24 hours) at 06/24/2018 0919 Last data filed at 06/23/2018 1340 Gross per 24 hour  Intake 118 ml  Output -  Net 118 ml   Last 3 Weights 06/24/2018 06/23/2018 06/22/2018  Weight (lbs) 111 lb 12.4 oz 113 lb 8.6 oz 114 lb 3.2 oz  Weight (kg) 50.7 kg 51.5 kg 51.8 kg      Telemetry    80-110s again atrial fibrillation- Personally Reviewed  ECG    A. fib- Personally Reviewed  Physical Exam   Laying in bed, frail-appearing thin conversant, alert.  Chest tube is removed   Labs    Chemistry Recent Labs  Lab 06/20/18 0500 06/21/18 0812  06/22/18 0531 06/24/18 0236  NA 135 134* 136 135  K 3.8 3.9 4.3 4.5  CL 95* 99 97* 98  CO2 27 25 28 26   GLUCOSE 108* 124* 150* 74  BUN 15 23 9 20   CREATININE 2.54* 3.37* 2.10* 3.51*  CALCIUM 7.9* 7.5* 7.9* 7.9*  PROT 5.3*  --   --   --   ALBUMIN 1.6* 1.6* 2.1* 1.9*  AST 20  --   --   --   ALT 9  --   --   --   ALKPHOS 65  --   --   --   BILITOT 0.7  --   --   --   GFRNONAA 18* 13* 23* 12*  GFRAA 21* 15* 26* 14*  ANIONGAP 13 10 11 11      Hematology Recent Labs  Lab 06/20/18 0500 06/21/18 0812 06/23/18 0247  WBC 10.5 7.4 6.7  RBC 3.39* 3.22* 3.34*  HGB 8.9* 8.5* 8.8*  HCT 29.6* 28.0* 29.4*  MCV 87.3 87.0 88.0  MCH 26.3 26.4 26.3  MCHC 30.1 30.4 29.9*  RDW 17.7* 17.4* 17.2*  PLT 131* 114* 94*    Cardiac EnzymesNo results for input(s): TROPONINI in the last 168 hours.  No results for input(s): TROPIPOC in the last 168 hours.   BNPNo results for input(s): BNP, PROBNP in the last 168 hours.   DDimer No results for input(s): DDIMER in the last 168 hours.   Radiology    Dg Chest Port 1 View  Result Date: 06/24/2018 CLINICAL DATA:  Trapped lung. EXAM: PORTABLE CHEST 1 VIEW COMPARISON:  June 23, 2018 FINDINGS: The right-sided chest tube has been removed. No pneumothorax. A small left pleural effusion is stable. Loculated effusion on the right with underlying opacity remains. The effusion and opacity at the right base are more prominent. Probable mild edema/pulmonary venous congestion. No other changes. IMPRESSION: 1. No pneumothorax after right chest tube removal. 2. Persistent loculated right pleural effusion with underlying opacity. The effusion and opacity at the right base is more prominent in the interval. 3. Small left effusion. 4. Mild pulmonary edema. Electronically Signed   By: Dorise Bullion III M.D   On: 06/24/2018 08:29   Dg Chest Port 1 View  Result Date: 06/23/2018 CLINICAL DATA:  Chest tube EXAM: PORTABLE CHEST 1 VIEW COMPARISON:  06/22/2018 FINDINGS: Stable  right chest tube and suspected right loculated pleural effusion. No pneumothorax consolidation in the right mid lung zone has increased. Vascular congestion is stable. Small left pleural effusion and hazy opacity at the left base is not significantly changed. IMPRESSION: Stable right chest tube without pneumothorax. Consolidation in the right mid lung zone has increased. Small left pleural effusion and hazy opacity at the left base is stable. Vascular congestion is stable. Electronically Signed   By: Marybelle Killings M.D.   On: 06/23/2018 08:06   Dg Chest Port 1 View  Result Date: 06/22/2018 CLINICAL DATA:  Empyema EXAM: PORTABLE CHEST 1 VIEW COMPARISON:  06/21/2018, 06/20/2018, 06/19/2018, CT 06/15/2018, shoulder radiograph 03/27/2018 FINDINGS: Right-sided central venous catheter has been removed. A right lower chest tube remains in place. Appearance of decreased loculated air collection at the right cardio phrenic sulcus. Similar appearance of residual right pleural thickening. Small left-sided pleural effusion. No change dense airspace disease left base and lingula. Stable enlarged cardiomediastinal silhouette. 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 IMPRESSION: 1. Removal of right-sided central venous catheter 2. No significant interval change in residual loculated right-sided pleural thickening. 3. No change in small left effusion and left basilar airspace disease. 4. Cardiomegaly. Electronically Signed   By: Donavan Foil M.D.   On: 06/22/2018 15:17    Cardiac Studies   Echo 06/11/2018: -EF 40 to 45%, mild elevated pulmonary pressures moderate mitral regurgitation  Patient Profile     73 y.o. female end-stage renal disease on hemodialysis with persistent atrial fibrillation difficult rate control with transient hypotension on Coumadin with empyema chest tube, now removed  Assessment & Plan    Persistent atrial fibrillation - Continue amiodarone and attempt cardioversion 3 weeks  after she maintains therapeutic INR from 2-3. - Current rates have been in the 90s mostly.  She is also been scheduled to get midodrine 10 mg pre-hemodialysis and PRN at halfway point by Dr. Pearson Grippe.   She has been noted in the past to have heart rates increasing to 100-130 during hemodialysis.  Even with sinus rhythm, she certainly could have a degree of hypotension.  Even after cardioversion, at increased risk for atrial fibrillation once again.  End-stage renal disease -Nephrology notes reviewed.  Hemodialysis can be challenging secondary to hypotension.  Seems to be an ongoing issue.  No changes.  Empyema -  VATS, right empyema,  chest tube.  This is also potentiating her hypotension.  Continuing with drainage, IV antibiotics.  Chest tube discontinued.  Prognosis remains poor.  No changes.     For questions or updates, please contact [place] Please consult [email or web address] for contact info under      Signed, Candee Furbish, MD  06/24/2018, 9:19 AM

## 2018-06-24 NOTE — Progress Notes (Addendum)
[place] for warfarin Indication: atrial fibrillation  Allergies  Allergen Reactions  . Sulfa Antibiotics Nausea Only    Patient Measurements: Height: 5\' 4"  (162.6 cm) Weight: 111 lb 12.4 oz (50.7 kg) IBW/kg (Calculated) : 54.7  Vital Signs: Temp: 97.2 F (36.2 C) (04/28 0437) Temp Source: Oral (04/28 0437) BP: 120/98 (04/28 0437) Pulse Rate: 111 (04/28 0437)  Labs: Recent Labs    06/21/18 0812 06/22/18 0531 06/23/18 0247 06/24/18 0236  HGB 8.5*  --  8.8*  --   HCT 28.0*  --  29.4*  --   PLT 114*  --  94*  --   LABPROT  --  17.4* 23.4* 30.7*  INR  --  1.4* 2.1* 3.0*  CREATININE 3.37* 2.10*  --  3.51*    Estimated Creatinine Clearance: 11.3 mL/min (A) (by C-G formula based on SCr of 3.51 mg/dL (H)).   Medical History: Past Medical History:  Diagnosis Date  . Anemia    takes Folic Acid;pt states she gets an injection every 2wks   . Chronic combined systolic and diastolic congestive heart failure ([place])   . Closed right hip fracture, initial encounter ([place]) 10/19/2017  . Depression    but doesn't take any meds for it  . Diabetes mellitus    takes Lantus and Novolog 70/30;fasting blood sugar 200;Type 2  . GERD (gastroesophageal reflux disease)    takes Omeprazole daily  . GI bleeding   . History of bladder infections    last one a month ago;saw Dr.Wrenn and cysto was done in office;was on antibiotics and completed 2wks ago  . History of blood transfusion    no blood transfusion  . HLD (hyperlipidemia) 10/19/2017  . HTN (hypertension) 10/19/2017  . Hyperlipidemia    takes Zocor daily  . Hypertension    takes Coreg daily  . Mitral regurgitation   . Myocardial infarction ([place]) 2010  . PAF (paroxysmal atrial fibrillation) ([place])   . Pancreatitis   . Peripheral neuropathy   . Pleural effusion, bilateral   . 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 UTI (lower urinary tract infection)     Assessment: 73 year old female s/p  VATS 4/22. Patient with history of afib on warfarin prior to admission. Pharmacy has been consulted for warfarin dosing.   PTA regimen is 2.5 mg daily except 1.25 mg on Saturday. Admit INR was 4.   Of note, ABLA noted, and pt was given Vitamin K IV 10 mg on 4/19 and 4/20, with an additional 5 mg IV x1 4/20. Additionally, pt has been on amiodarone since 4/15, which can increase warfarin concentrations and therefore INR.   INR jumped from 2.1 to 3 - warfarin restarted on 4/23. Pt is also on Lovenox for DVT ppx while INR is subtherapeutic- will discontinue today. Hgb 8.8, plt 94. No s/sx of bleeding.   Goal of Therapy:  INR 2-3 Monitor platelets by anticoagulation protocol: Yes   Plan:  Holding warfarin tonight Stop enoxaparin Daily INR  Antonietta Jewel, PharmD, BCCCP Clinical Pharmacist  Pager: [phone] Phone: [phone] 06/24/2018 7:42 AM

## 2018-06-24 NOTE — Progress Notes (Signed)
Initial Nutrition Assessment  RD working remotely.  DOCUMENTATION CODES:   Not applicable, suspect some degree of malnutrition but unable to diagnose without completion of NFPE  INTERVENTION:   - Boost Breeze po TID, each supplement provides 250 kcal and 9 grams of protein  - Continue rena-vit daily  - Pro-stat 30 ml BID, each supplement provides 100 kcal and 15 grams of protein  - d/c Nepro Shake due to pt continued refusal  NUTRITION DIAGNOSIS:   Inadequate oral intake related to poor appetite as evidenced by per patient/family report.  GOAL:   Patient will meet greater than or equal to 90% of their needs  MONITOR:   PO intake, Supplement acceptance, Labs, Skin, I & O's, Weight trends  REASON FOR ASSESSMENT:   LOS    ASSESSMENT:   73 year old female who was admitted on 4/15 with generalized weakness. Pt initially presented to [facility] with A-fib with RVR and right-sided pleural effusion. PMH of ESRD on HD, HTN, HLD, T2DM, GERD. Pt admitted with acute on chronic CHF and A-fib with RVR.  4/17 - s/p IR thoracentesis with 300 ml of blood fluid removed 4/20 - s/p CT guided right chest tube placement by IR 4/22 - s/p VATS for drainage of empyema 4/27 - CT removed  Reviewed CVTS and nephrology notes, pt's prognosis is poor.  Pt's diet has been liberated to Regular to promote PO intake.  Spoke with pt via phone call to room. Pt states that she just had some jello and is waiting for dialysis. Pt reports that that she felt good yesterday and feels okay today but knows that dialysis will "wipe me out."  Pt endorses having a poor appetite and states "I'm not eating very much." Pt reports she has been mostly having "cereal for breakfast and cereal for dinner." Pt believes that her poor appetite started "a couple months ago" and believes it is because she "sits around and lays around" which causes her not to get an appetite.  Pt reports "I've never been a good eater" and "I've  never been someone who eats 3 meals a day." Pt reports that PTA, she ate 2 meals daily. Pt states, "I eat whatever I want."  Breakfast: sausage biscuit OR egg biscuit OR cereal Lunch: skipped Diner: husband fixes something or take-out from a restaurant (usually a meat and 1 vegetable or 2 vegetables with no meat)  Pt endorses weight loss over the last "couple months." Pt reports her UBW prior to any weight loss was 140 lbs and is not sure exactly when she weighed that. Reviewed weight history in chart. Pt has experienced a 7.6 kg weight loss since 08/06/17. This is a 13% weight loss in 10 months which is not quite significant for timeframe. Overall, pt has lost 8 lbs during admission to the hospital. Suspect some of weight loss could be related to fluid status but also believe pt has been losing dry weight.  Pt states she does not like Nepro shakes and has tried and doesn't like Ensure or Glucerna either. Pt reports that she has not tried [employer] and is amenable to trying them.  Pt confirms plan to d/c to CIR.  Dicussed pt with RN who confirms pt is not eating much of anything. Discussed plan to switch supplement.  Meal Completion: - 4/24: 50%, 40% - 4/25: 20%, 20% - 4/26: 80%, 20%, 30% - 4/27: 15%, 20% - 4/28: 100%  Medications reviewed and include: Nepro BID, SSI, rena-vit  Labs reviewed:Marland Kitchen CBG's: 72,  81, 169 x 24 hours  Net UF 4/25: 833 ml  NUTRITION - FOCUSED PHYSICAL EXAM:  Unable to complete at this time. RD working remotely.  Diet Order:   Diet Order            Diet regular Room service appropriate? Yes; Fluid consistency: Thin  Diet effective now              EDUCATION NEEDS:   Education needs have been addressed  Skin:  Skin Assessment: Skin Integrity Issues: Unstageable: sacrum Incisions: closed incision to chest Other: non-pressure wounds to right leg  Last BM:  06/21/18  Height:   Ht Readings from Last 1 Encounters:  06/11/18 5\' 4"  (1.626 m)     Weight:   Wt Readings from Last 1 Encounters:  06/24/18 50.7 kg    Ideal Body Weight:  54.5 kg  BMI:  Body mass index is 19.19 kg/m.  Estimated Nutritional Needs:   Kcal:  1350-1550  Protein:  70-85 grams  Fluid:  UOP + 1000 ml    Gaynell Face, MS, RD, LDN Inpatient Clinical Dietitian Pager: [phone] Weekend/After Hours: [phone]

## 2018-06-24 NOTE — Telephone Encounter (Signed)
LEFT MESSAGE TO CALL BACK- TOC  CALL

## 2018-06-25 DIAGNOSIS — M24512 Contracture, left shoulder: Secondary | ICD-10-CM

## 2018-06-25 DIAGNOSIS — R627 Adult failure to thrive: Secondary | ICD-10-CM

## 2018-06-25 DIAGNOSIS — R5381 Other malaise: Secondary | ICD-10-CM

## 2018-06-25 DIAGNOSIS — I953 Hypotension of hemodialysis: Secondary | ICD-10-CM

## 2018-06-25 LAB — GLUCOSE, CAPILLARY
Glucose-Capillary: 46 mg/dL — ABNORMAL LOW (ref 70–99)
Glucose-Capillary: 140 mg/dL — ABNORMAL HIGH (ref 70–99)
Glucose-Capillary: 52 mg/dL — ABNORMAL LOW (ref 70–99)
Glucose-Capillary: 71 mg/dL (ref 70–99)
Glucose-Capillary: 81 mg/dL (ref 70–99)
Glucose-Capillary: 206 mg/dL — ABNORMAL HIGH (ref 70–99)

## 2018-06-25 LAB — RENAL FUNCTION PANEL
Albumin: 1.8 g/dL — ABNORMAL LOW (ref 3.5–5.0)
Anion gap: 8 (ref 5–15)
BUN: 27 mg/dL — ABNORMAL HIGH (ref 8–23)
CO2: 28 mmol/L (ref 22–32)
Calcium: 7.6 mg/dL — ABNORMAL LOW (ref 8.9–10.3)
Chloride: 97 mmol/L — ABNORMAL LOW (ref 98–111)
Creatinine, Ser: 4.4 mg/dL — ABNORMAL HIGH (ref 0.44–1.00)
GFR calc Af Amer: 11 mL/min — ABNORMAL LOW (ref >60)
GFR calc non Af Amer: 9 mL/min — ABNORMAL LOW (ref >60)
Glucose, Bld: 48 mg/dL — ABNORMAL LOW (ref 70–99)
Phosphorus: 3.5 mg/dL (ref 2.5–4.6)
Potassium: 4 mmol/L (ref 3.5–5.1)
Sodium: 133 mmol/L — ABNORMAL LOW (ref 135–145)

## 2018-06-25 LAB — CBC
HCT: 26.7 % — ABNORMAL LOW (ref 36.0–46.0)
Hemoglobin: 8 g/dL — ABNORMAL LOW (ref 12.0–15.0)
MCH: 26.1 pg (ref 26.0–34.0)
MCHC: 30 g/dL (ref 30.0–36.0)
MCV: 87 fL (ref 80.0–100.0)
Platelets: 91 10*3/uL — ABNORMAL LOW (ref 150–400)
RBC: 3.07 MIL/uL — ABNORMAL LOW (ref 3.87–5.11)
RDW: 17.2 % — ABNORMAL HIGH (ref 11.5–15.5)
WBC: 8.4 10*3/uL (ref 4.0–10.5)
nRBC: 0 % (ref 0.0–0.2)

## 2018-06-25 LAB — PROTIME-INR
INR: 3.2 — ABNORMAL HIGH (ref 0.8–1.2)
Prothrombin Time: 31.9 seconds — ABNORMAL HIGH (ref 11.4–15.2)

## 2018-06-25 MED ORDER — MIDODRINE HCL 5 MG PO TABS
10.0000 mg | ORAL_TABLET | Freq: Three times a day (TID) | ORAL | Status: DC
  Administered 2018-06-25 – 2018-06-27 (×9): 10 mg via ORAL
  Filled 2018-06-25 (×7): qty 2

## 2018-06-25 MED ORDER — SODIUM CHLORIDE 0.9 % IV SOLN
INTRAVENOUS | Status: DC | PRN
  Administered 2018-06-25: 07:00:00 500 mL via INTRAVENOUS

## 2018-06-25 MED ORDER — MIDODRINE HCL 5 MG PO TABS
ORAL_TABLET | ORAL | Status: AC
  Administered 2018-06-25: 10 mg via ORAL
  Filled 2018-06-25: qty 2

## 2018-06-25 MED ORDER — INSULIN ASPART 100 UNIT/ML ~~LOC~~ SOLN
0.0000 [IU] | Freq: Three times a day (TID) | SUBCUTANEOUS | Status: DC
  Administered 2018-06-26 – 2018-06-27 (×2): 1 [IU] via SUBCUTANEOUS

## 2018-06-25 MED ORDER — DEXTROSE 50 % IV SOLN
INTRAVENOUS | Status: AC
  Administered 2018-06-25: 25 mL
  Filled 2018-06-25: qty 50

## 2018-06-25 MED ORDER — DEXTROSE 5 % IV SOLN: 3.0000 g | INTRAVENOUS | Status: DC

## 2018-06-25 MED ORDER — DARBEPOETIN ALFA 100 MCG/0.5ML IJ SOSY: 100.0000 ug | PREFILLED_SYRINGE | INTRAMUSCULAR | Status: DC

## 2018-06-25 MED ORDER — SODIUM CHLORIDE 0.9 % IV SOLN
2.0000 g | INTRAVENOUS | Status: DC
  Administered 2018-06-26: 2 g via INTRAVENOUS
  Filled 2018-06-25: qty 2

## 2018-06-25 NOTE — TOC Progression Note (Signed)
Transition of Care [facility]) - Progression Note    Patient Details  Name: Vanessa Webb MRN: [id number] Date of Birth: [birth year]  Transition of Care [facility]) CM/SW [place], [place] Phone Number: [phone] 06/25/2018, 3:09 PM  Clinical Narrative:    CSW visited with the patient at bedside. She was alert and oriented. CSW discussed possible placement at SNF if not admitted into CIR. Patient states she lives in a single level home with her spouse and the support of her daughter.   Patient recognizes need for rehab before returning home and is agreeable to a SNF placement but wanted to wait until she receives decision from CIR. Patient is willing to consider [facility] and [facility]. CSW encouraged the patient to discuss placement options and decision with family.   Patient expressed understanding of CSW role and discharge process as well as medical condition. No questions/concerns about plan or treatment at this time.  Thurmond Butts, MSW, [facility] Clinical Social Worker [phone]    Expected Discharge Plan: [place] Barriers to Discharge: Continued Medical Work up  Expected Discharge Plan and Services Expected Discharge Plan: [place] In-house Referral: NA Discharge Planning Services: CM Consult Post Acute Care Choice: [place] arrangements for the past 2 months: Single Family Home                 DME Arranged: N/A DME Agency: NA       HH Arranged: RN, Disease Management, PT [place] Agency: [place] [facility]         Social Determinants of Health (SDOH) Interventions    Readmission Risk Interventions No flowsheet data found.

## 2018-06-25 NOTE — Progress Notes (Signed)
[facility] for Infectious Disease   Reason for visit: Follow up on empyema  Antimicrobials: Cefepime, day 7  Interval History: tolerated HD w/o event yesterday (didn't go for HD until late last PM, but she did get cefepime after HD session). Appetite remains extremely poor as pt sleeps when meals arrive, then complains of food being cold and still won't eat. Discussed strategies re: nutrition at length with nursing staff today. Labs, cxs, HD schedule, provider notes all independently reviewed   Physical Exam: Constitutional: chronically ill, small stature, A&O x 3 Vitals:   06/25/18 0547 06/25/18 0823  BP: (!) 124/59 124/67  Pulse: 68 87  Resp:  (!) 21  Temp:  (!) 97.2 F (36.2 C)  SpO2:  96%  EYES: anicteric, PERRL, EOMI ENMT:dry MM, OP clear, mild bitemporal wasting Neck: mild JVD, supple Cardiovascular:tachycardic rate, irregular rhythm, no murmurs appreciated Respiratory:decreased BS at RT base, RT sided CT intact and connected to LWS, no wheeze QM:GQQP, NTND, +BS Musculoskeletal:normal muscle tone, slight atrophy Extrems: no LE edema, RT arm AVF with good palpable thrill and audible bruit Skin:poor skin turgor, no rashes Neuro: CN II-XII grossly intact, no focal neurologic deficits, gait was not assessed as pt evaluated on HD Lines: RT IJ TLC, RT chest tube   Review of Systems: +malaise, FTT x 3 weeks PTA, +DOE and pleuritic CP x 1 week PTA, no F/C, no N/V/D, no HA, +anuric All other systems reviewed and are negative  Lab Results  Component Value Date   WBC 8.4 06/25/2018   HGB 8.0 (L) 06/25/2018   HCT 26.7 (L) 06/25/2018   MCV 87.0 06/25/2018   PLT 91 (L) 06/25/2018    Lab Results  Component Value Date   CREATININE 4.40 (H) 06/25/2018   BUN 27 (H) 06/25/2018   NA 133 (L) 06/25/2018   K 4.0 06/25/2018   CL 97 (L) 06/25/2018   CO2 28 06/25/2018    Lab Results  Component Value Date   ALT 9 06/20/2018   AST 20 06/20/2018   ALKPHOS 65  06/20/2018     Microbiology: Recent Results (from the past 240 hour(s))  Surgical pcr screen     Status: None   Collection Time: 06/17/18  9:47 PM  Result Value Ref Range Status   MRSA, PCR NEGATIVE NEGATIVE Final   Staphylococcus aureus NEGATIVE NEGATIVE Final    Comment: (NOTE) The Xpert SA Assay (FDA approved for NASAL specimens in patients 56 years of age and older), is one component of a comprehensive surveillance program. It is not intended to diagnose infection nor to guide or monitor treatment. Performed at [facility], [place] [address], [place], [place] [address]     Impression/Plan: The patient is a 73 y/o WF diabetic with ESRD on HD admitted to OSH with FTT in Afib with RVR found to have a RT sided pleural effusion transitioning to an empyema/trapped lung, s/p VATS with chest tube drainage.  1. Empyema -while Pseudomonas isanunusual community-acquired pathogen, this was isolated from her pleural fluid culture following her thoracentesis. Patient denies receiving any antecedent antibiotics prior to her admission despite her prolonged generalized illness. Interestingly, the patient did not report dyspnea or cough so much as pleuritic chest pain in the 3 weeks prior to her admission here. She underwent successful VATS/decortication for empyema on April 22 and now has a right-sided chest tube that remains intact. Will defer management of her chest tube to CT surgery. Continue cefepime monotherapy but will change from  daily dosing to dosing on 2-2-3 protocol after HD. Given the patient's A. fib with RVR recently, I would be cautious using a quinolone only for outpatient treatment of her pathogen. Her isolate was pansensitive. I do suspect that the patient developed this complication as a result of a community-acquired pneumonia for which she did not seek initial care. Would anticipate a 4-week course of antibiotics in total from the time of her decortication with repeat  chest imaging used to assist in determining final duration of treatment. Tentative ABX d/c date is 07/13/2018.  2. Leukocytosis -the patient's white blood cell count peaked around approximately 20,000 prior to her thoracentesis. Since evacuation of her fluid collection and particularly since her VATS the patient's white blood cell count is now normalized along with empiric antibiotics. Check this patient CBC with differential regularly now that she has her white blood cell count returning to normal limits.  3. ESRD - Thepatient normally dialyzes Tuesday,Thursday,Saturday as an outpatient. While parenteral therapy would be preferred given that she has a gram-negative pathogen,ordinarily she would require daily dosing of IV antibiotics and therefore would require a second IV line. In order to avoid a secon line in an HD patient, will attempt a schedule dosing only after HD on a 2-2-3 cefepime regimen. Would request that nephrology notify ID team of any straying from her normal HD schedule as this will impact her ABX schedule as well and may require additional dosing to bridge.

## 2018-06-25 NOTE — Plan of Care (Signed)
Patient remains weak and w/poor appetite.  She has been trying to participate in PT but continues to need much encouragement.  Today she was declined as a patient for inpatient rehab d/t inability to continue w/3 hrs of therapy per day.  Pt has given CSW options for SNF placement.  Will continue to monitor and encourage increased activity as well as increased PO intake.

## 2018-06-25 NOTE — Telephone Encounter (Signed)
06/25/18-pt currently hospitalized/al

## 2018-06-25 NOTE — Consult Note (Signed)
Physical Medicine and Rehabilitation Consult   Reason for Consult: Debility Referring Physician:  Dr. Erlinda Hong   HPI: Vanessa Webb is a 73 y.o. female with history of ESRD, T2DM, RLE wounds, thrombocytopenia, who was evaluated at [facility] for A fib with RVR, malaise with weakness and right pleural effusion. Due to difficulty with rate control as well as need for HD, she was transferred to [facility] on 06/11/18 for management.  She was maintained on IV amiodarone and CT chest 4/17  done revealing large right empyema. She underwent thoracocentesis of 300 cc fluid with chest tube placement. Follow up CXR showed persistent lung collapse and she underwent R-VATS for drainage by Dr. Roxy Manns. Chest tube placed for drainage and was discontinued 4/27.  Pleural fluid positive for pseudomonas and to continue Cefepime X 4 weeks per ID input. Will need repeat CT chest to determine antibiotic course--tentative end-date 07/13/18   RLE wound and sacral decub being managed with conservative care. She has had issue with confusion, hypotension affecting HD as well as difficulty with heart rate control. Plans to continue amiodarone load with DCCV in 3 weeks?  Therapy ongoing and patient noted to have BLE instability with pregait activity as well as delayed processing with difficulty completing basic ADLs. CIR recommended due to debility.    Patient had incontinence of loose stool during our visit. Patient indicates that prior to hospitalization, she was able to do her own ADLs independently and ambulate. She has not done much ambulating with PT thus far. She denies any worsening of breathing status post chest tube removal  Review of Systems  Constitutional: Negative for chills and fever.  HENT: Negative for hearing loss and tinnitus.   Musculoskeletal: Positive for falls (right shoulder Fx 3 months ago).  Skin: Negative for rash.      Past Medical History:  Diagnosis Date  . Anemia    takes Folic Acid;pt states she  gets an injection every 2wks   . Chronic combined systolic and diastolic congestive heart failure ([place])   . Closed right hip fracture, initial encounter ([place]) 10/19/2017  . Depression    but doesn't take any meds for it  . Diabetes mellitus    takes Lantus and Novolog 70/30;fasting blood sugar 200;Type 2  . GERD (gastroesophageal reflux disease)    takes Omeprazole daily  . GI bleeding   . History of bladder infections    last one a month ago;saw Dr.Wrenn and cysto was done in office;was on antibiotics and completed 2wks ago  . History of blood transfusion    no blood transfusion  . HLD (hyperlipidemia) 10/19/2017  . HTN (hypertension) 10/19/2017  . Hyperlipidemia    takes Zocor daily  . Hypertension    takes Coreg daily  . Mitral regurgitation   . Myocardial infarction ([place]) 2010  . PAF (paroxysmal atrial fibrillation) ([place])   . Pancreatitis   . Peripheral neuropathy   . Pleural effusion, bilateral   . 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 UTI (lower urinary tract infection)     Past Surgical History:  Procedure Laterality Date  . A/V FISTULAGRAM Right 12/03/2016   Procedure: A/V Fistulagram - Right Arm;  Surgeon: Angelia Mould, MD;  Location: [place] [facility];  Service: Cardiovascular;  Laterality: Right;  . ABDOMINAL HYSTERECTOMY  1976  . APPENDECTOMY    . BASCILIC VEIN TRANSPOSITION Right 09/11/2012   Procedure: BASCILIC VEIN TRANSPOSITION- RIGHT ARM;  Surgeon: Mal Misty, MD;  Location:  [facility] OR;  Service: Vascular;  Laterality: Right;  . bilateral laser surgery    . CARDIAC CATHETERIZATION  2010  . CATARACT EXTRACTION W/PHACO  03/01/2011   Procedure: CATARACT EXTRACTION PHACO AND INTRAOCULAR LENS PLACEMENT (IOC);  Surgeon: Tonny Branch;  Location: [facility];  Service: Ophthalmology;  Laterality: Right;  CDE: 15.83  . CHOLECYSTECTOMY  2010  . DILATION AND CURETTAGE OF UTERUS  1965  . ESOPHAGOGASTRODUODENOSCOPY    . hemorrhage to bilateral eye    . INTRAMEDULLARY (IM)  NAIL INTERTROCHANTERIC Right 10/20/2017   Procedure: INTRAMEDULLARY (IM) NAIL INTERTROCHANTRIC FEMORAL NAIL;  Surgeon: Mcarthur Rossetti, MD;  Location: [place];  Service: Orthopedics;  Laterality: Right;  . IR THORACENTESIS ASP PLEURAL SPACE W/IMG GUIDE  06/13/2018  . PERIPHERAL VASCULAR BALLOON ANGIOPLASTY Right 12/03/2016   Procedure: PERIPHERAL VASCULAR BALLOON ANGIOPLASTY;  Surgeon: Angelia Mould, MD;  Location: [place] [facility];  Service: Cardiovascular;  Laterality: Right;  arm fistula  . ruptured ulcer in stomach  2010   had a feeding tube over a month  . SHUNTOGRAM N/A 12/02/2012   Procedure: Fistulagram;  Surgeon: Serafina Mitchell, MD;  Location: [facility] CATH LAB;  Service: Cardiovascular;  Laterality: N/A;  . VIDEO ASSISTED THORACOSCOPY (VATS)/EMPYEMA Right 06/18/2018   Procedure: VIDEO ASSISTED THORACOSCOPY (VATS)/ DRAINAGE OF EMPYEMA;  Surgeon: Rexene Alberts, MD;  Location: [facility] OR;  Service: Thoracic;  Laterality: Right;    Family History  Problem Relation Age of Onset  . Diabetes Mother   . Cancer Father   . Diabetes Daughter   . Heart attack Daughter   . Pneumonia Daughter   . Anesthesia problems Neg Hx   . Hypotension Neg Hx   . Malignant hyperthermia Neg Hx   . Pseudochol deficiency Neg Hx     Social History:  reports that she has never smoked. She has never used smokeless tobacco. She reports that she does not drink alcohol or use drugs.   Allergies  Allergen Reactions  . Sulfa Antibiotics Nausea Only   Medications Prior to Admission  Medication Sig Dispense Refill  . acetaminophen (TYLENOL) 500 MG tablet Take 500 mg by mouth every 6 (six) hours as needed (for pain.).    Marland Kitchen amLODipine (NORVASC) 2.5 MG tablet TAKE ONE TABLET BY MOUTH ON NON DIALYSIS DAYS. (MONDAY, WEDNESDAY, FRIDAY & SUNDAY) (Patient taking differently: Take 2.5 mg by mouth See admin instructions. TAKE ONE TABLET BY MOUTH ON NON DIALYSIS DAYS. (MONDAY, WEDNESDAY, FRIDAY & SUNDAY)) 70 tablet 3   . atorvastatin (LIPITOR) 20 MG tablet Take 20 mg by mouth at bedtime.    . lidocaine-prilocaine (EMLA) cream Apply 1 application topically daily as needed (prior to port being accessed).     . metoprolol tartrate (LOPRESSOR) 25 MG tablet TAKE 1 AND 1/2 TABLETS BY MOUTH TWICE DAILY. (MORNING,BEDTIME) (Patient taking differently: Take 25 mg by mouth 2 (two) times daily. ) 270 tablet 3  . oxyCODONE (OXY IR/ROXICODONE) 5 MG immediate release tablet Take 1 tablet (5 mg total) by mouth every 4 (four) hours as needed for severe pain. 20 tablet 0  . warfarin (COUMADIN) 2.5 MG tablet Take 1 tablet daily except 1/2 tablet on Saturdays as directed (Patient taking differently: Take 1.25-2.5 mg by mouth See admin instructions. Take 2.5MG  by mouth daily except 1.25MG  on saturdays.) 30 tablet 6    Home: Home Living Family/patient expects to be discharged to:: Private residence Living Arrangements: Spouse/significant other Available Help at Discharge: Family(? `24/7) Type of Home: House Home Access:  Stairs to enter [employer] of Steps: 2 STE Entrance Stairs-Rails: None Home Layout: One level Bathroom Shower/Tub: Tub/shower unit, Architectural technologist: Standard Bathroom Accessibility: No Home Equipment: Tub bench, Grab bars - tub/shower, Bedside commode, Walker - 2 wheels  Functional History: Prior Function Level of Independence: Needs assistance(has needed assist or AD for over a month) Gait / Transfers Assistance Needed: uses RW - but has been trouble walking for over a month ADL's / Homemaking Assistance Needed: assist from husband Comments: Husband drives her to and from HD Functional Status:  Mobility: Bed Mobility Overal bed mobility: Needs Assistance Bed Mobility: Supine to Sit, Sit to Supine Supine to sit: Mod assist Sit to supine: Mod assist General bed mobility comments: More truncal assist to come forward up via right elbow than the need to assist the LE's into bed.   Scooting eo EOB in sitting with min guard. Transfers Overall transfer level: Needs assistance Equipment used: Rolling walker (2 wheeled) Transfers: Sit to/from Stand Sit to Stand: Mod assist Stand pivot transfers: Mod assist General transfer comment: 3 trials of standing with cues for hand placemet, assist coming forward and stability for weak knees. Ambulation/Gait General Gait Details: pregait tasks with stepping 2 steps forward and back with stability assist and movement of the RW`    ADL: ADL Overall ADL's : Needs assistance/impaired Eating/Feeding: Modified independent Grooming: Oral care, Wash/dry face, Minimal assistance, Standing Grooming Details (indicate cue type and reason): sit<>stand at the sink, leans against sink for stability - but visibly fatigues with standing activity Upper Body Bathing: Moderate assistance Lower Body Bathing: Moderate assistance Upper Body Dressing : Min guard Lower Body Dressing: Maximal assistance, Sit to/from stand Toilet Transfer: Moderate assistance, Buyer, retail Details (indicate cue type and reason): simulated through recliner transfer [place] and Hygiene: Moderate assistance, Sit to/from stand Functional mobility during ADLs: Moderate assistance, +2 for physical assistance, +2 for safety/equipment General ADL Comments: fatigues quickly, very unsteady on her feet, watch for feet to push back in an odd fashion  Cognition: Cognition Overall Cognitive Status: No family/caregiver present to determine baseline cognitive functioning Orientation Level: Oriented X4 Cognition Arousal/Alertness: Awake/alert Behavior During Therapy: Flat affect Overall Cognitive Status: No family/caregiver present to determine baseline cognitive functioning Area of Impairment: Following commands, Problem solving Following Commands: Follows one step commands with increased time Problem Solving: Slow processing, Requires  verbal cues General Comments: slow processing, able to follow simple commands   Blood pressure 124/67, pulse 87, temperature (!) 97.2 F (36.2 C), temperature source Oral, resp. rate (!) 21, height 5\' 4"  (1.626 m), weight 51.3 kg, SpO2 96 %. Physical Exam  Nursing note and vitals reviewed. Constitutional: She is oriented to person, place, and time. She appears well-developed.  Cachectic  HENT:  Head: Normocephalic and atraumatic.  Eyes: Pupils are equal, round, and reactive to light. Conjunctivae and EOM are normal. Right eye exhibits no discharge. Left eye exhibits no discharge. No scleral icterus.  Neck: Normal range of motion. Neck supple. No JVD present.  Cardiovascular: Normal heart sounds. An irregularly irregular rhythm present.  No murmur heard. Respiratory: Breath sounds normal. No stridor. She is in respiratory distress. She has no wheezes.  GI: Soft. Bowel sounds are normal. She exhibits no distension. There is no abdominal tenderness.  Musculoskeletal:     Left shoulder: She exhibits decreased range of motion and deformity.     Comments: Atrophy of left shoulder girdle Limited active range of motion to 45 degrees of abduction and  forward flexion  Neurological: She is alert and oriented to person, place, and time.  Motor strength is 3- at the left deltoid limited by range of motion 4 at the right deltoid 4 bilateral biceps triceps grip 4 bilateral hip flexor knee extensor ankle dorsiflexor Sensation is equal to light touch bilateral upper and lower extremities.  Gait was not tested due to stool incontinence Able to sit at edge of bed without loss of balance.  Her feet were dangling.  She did not require support  Skin: Skin is warm and dry.  Psychiatric: She has a normal mood and affect.    Results for orders placed or performed during the hospital encounter of 06/11/18 (from the past 24 hour(s))  Glucose, capillary     Status: None   Collection Time: 06/24/18 12:08 PM   Result Value Ref Range   Glucose-Capillary 72 70 - 99 mg/dL   Comment 1 Notify RN    Comment 2 Document in Chart   Glucose, capillary     Status: Abnormal   Collection Time: 06/24/18  4:01 PM  Result Value Ref Range   Glucose-Capillary 233 (H) 70 - 99 mg/dL   Comment 1 Notify RN    Comment 2 Document in Chart   Glucose, capillary     Status: Abnormal   Collection Time: 06/24/18  8:59 PM  Result Value Ref Range   Glucose-Capillary 155 (H) 70 - 99 mg/dL  Renal function panel     Status: Abnormal   Collection Time: 06/25/18  2:18 AM  Result Value Ref Range   Sodium 133 (L) 135 - 145 mmol/L   Potassium 4.0 3.5 - 5.1 mmol/L   Chloride 97 (L) 98 - 111 mmol/L   CO2 28 22 - 32 mmol/L   Glucose, Bld 48 (L) 70 - 99 mg/dL   BUN 27 (H) 8 - 23 mg/dL   Creatinine, Ser 4.40 (H) 0.44 - 1.00 mg/dL   Calcium 7.6 (L) 8.9 - 10.3 mg/dL   Phosphorus 3.5 2.5 - 4.6 mg/dL   Albumin 1.8 (L) 3.5 - 5.0 g/dL   GFR calc non Af Amer 9 (L) >60 mL/min   GFR calc Af Amer 11 (L) >60 mL/min   Anion gap 8 5 - 15  Protime-INR     Status: Abnormal   Collection Time: 06/25/18  3:05 AM  Result Value Ref Range   Prothrombin Time 31.9 (H) 11.4 - 15.2 seconds   INR 3.2 (H) 0.8 - 1.2  CBC     Status: Abnormal   Collection Time: 06/25/18  3:05 AM  Result Value Ref Range   WBC 8.4 4.0 - 10.5 K/uL   RBC 3.07 (L) 3.87 - 5.11 MIL/uL   Hemoglobin 8.0 (L) 12.0 - 15.0 g/dL   HCT 26.7 (L) 36.0 - 46.0 %   MCV 87.0 80.0 - 100.0 fL   MCH 26.1 26.0 - 34.0 pg   MCHC 30.0 30.0 - 36.0 g/dL   RDW 17.2 (H) 11.5 - 15.5 %   Platelets 91 (L) 150 - 400 K/uL   nRBC 0.0 0.0 - 0.2 %  Glucose, capillary     Status: Abnormal   Collection Time: 06/25/18  6:15 AM  Result Value Ref Range   Glucose-Capillary 46 (L) 70 - 99 mg/dL  Glucose, capillary     Status: Abnormal   Collection Time: 06/25/18  6:33 AM  Result Value Ref Range   Glucose-Capillary 52 (L) 70 - 99 mg/dL  Glucose, capillary  Status: Abnormal   Collection Time:  06/25/18  6:59 AM  Result Value Ref Range   Glucose-Capillary 140 (H) 70 - 99 mg/dL   Dg Chest Port 1 View  Result Date: 06/24/2018 CLINICAL DATA:  Trapped lung. EXAM: PORTABLE CHEST 1 VIEW COMPARISON:  June 23, 2018 FINDINGS: The right-sided chest tube has been removed. No pneumothorax. A small left pleural effusion is stable. Loculated effusion on the right with underlying opacity remains. The effusion and opacity at the right base are more prominent. Probable mild edema/pulmonary venous congestion. No other changes. IMPRESSION: 1. No pneumothorax after right chest tube removal. 2. Persistent loculated right pleural effusion with underlying opacity. The effusion and opacity at the right base is more prominent in the interval. 3. Small left effusion. 4. Mild pulmonary edema. Electronically Signed   By: Dorise Bullion III M.D   On: 06/24/2018 08:29     Assessment/Plan: Diagnosis: Will T after empyema in a patient with end-stage renal disease 1. Does the need for close, 24 hr/day medical supervision in concert with the patient's rehab needs make it unreasonable for this patient to be served in a less intensive setting? Yes and Potentially 2. Co-Morbidities requiring supervision/potential complications: Atrial fibrillation with rapid ventricular response, orthostatic hypotension acute and chronic, left shoulder contracture, bowel incontinence 3. Due to bowel management, safety, skin/wound care, disease management, medication administration, pain management and patient education, does the patient require 24 hr/day rehab nursing? Yes 4. Does the patient require coordinated care of a physician, rehab nurse, PT (1-2 hrs/day, 5 days/week) and OT (1-2 hrs/day, 5 days/week) to address physical and functional deficits in the context of the above medical diagnosis(es)? Yes Addressing deficits in the following areas: balance, endurance, locomotion, strength, transferring, bowel/bladder control, bathing,  dressing, feeding, toileting, cognition and psychosocial support 5. Can the patient actively participate in an intensive therapy program of at least 3 hrs of therapy per day at least 5 days per week? Potentially 6. The potential for patient to make measurable gains while on inpatient rehab is fair 7. Anticipated functional outcomes upon discharge from inpatient rehab are supervision  with PT, supervision with OT, n/a with SLP. 8. Estimated rehab length of stay to reach the above functional goals is: 12 to 16 days 9. Anticipated D/C setting: Home 10. Anticipated post D/C treatments: [facility] therapy 11. Overall Rehab/Functional Prognosis: fair  RECOMMENDATIONS: This patient's condition is appropriate for continued rehabilitative care in the following setting: CIR if able to tolerate ambulation with therapy keeping HR less than 120 bpm and blood pressure above 90 with standing Patient has agreed to participate in recommended program. Potentially Note that insurance prior authorization may be required for reimbursement for recommended care.  Comment: We will need to monitor therapy tolerance.  Would like acute care therapy to document HR during therapy as well as blood pressure during episodes of dizziness.  Rehab admission coordinator to follow-up May benefit from thigh-high TED hose during ambulation  Bary Leriche, PA-C 06/25/2018

## 2018-06-25 NOTE — Progress Notes (Signed)
Occupational Therapy Treatment Patient Details Name: Vanessa Webb MRN: [id number] DOB: 06-Jul-1945 Today's Date: 06/25/2018    History of present illness  Vanessa Webb is a 73 y.o. female with medical history significant of ESRD on TTS schedule, hypertension, hyperlipidemia, type 2 diabetes mellitus, on chronic anticoagulation with Coumadin presents to the hospital with chief complaint of generalized weakness.  Found to be in Afib with RVR.  CT showed large right sided pleural effusion.  pt s/p right thoracocentensis on 4/17.  Chest tube placed by IR 4/19.  Pt s/p VATS for empyema with pseudomonas 4/22.   OT comments  Pt progressing towards OT goals this session.Mod a for bed mobility and set up for grooming tasks seated EOB. Completed sit <>stand transfers x4 with mod to max A from EOB. Pt able to progress to short in room mobility with mod to max A +2 for safety. Therapies recorded vitals throughout:  Sitting: max HR 103 BP 80/60 (73) Standing: max HR 117 BP 101/86 (92)  Walking trial #1: Max HR 134 BP 115/90 (100) Walking trail #2: max HR 127 BP 98/34 (54)  Due to MD note and  HR exceeding 120 with mobility, changed DC recommendation to SNF. Pt continues to benefit from skilled OT in the acute setting as well as afterwards at the SNF level to maximize safety and independence in ADL and functional transfers.  Follow Up Recommendations  SNF;Supervision/Assistance - 24 hour    Equipment Recommendations  3 in 1 bedside commode;Other (comment)(defer to next venue)    Recommendations for Other Services      Precautions / Restrictions Precautions Precautions: Fall Restrictions Weight Bearing Restrictions: No       Mobility Bed Mobility Overal bed mobility: Needs Assistance Bed Mobility: Supine to Sit;Sit to Supine     Supine to sit: Mod assist Sit to supine: Mod assist   General bed mobility comments: More truncal assist to come forward up via right elbow than the need to  assist the LE's into bed.  Scooting EOB in sitting with min guard.  Transfers Overall transfer level: Needs assistance Equipment used: Rolling walker (2 wheeled) Transfers: Sit to/from Stand Sit to Stand: Mod assist         General transfer comment: able to perform sit<>stand x4 from EOB with mod A and vc for hand placement each time    Balance Overall balance assessment: Needs assistance Sitting-balance support: No upper extremity supported;Feet supported Sitting balance-Leahy Scale: Fair Sitting balance - Comments: able to maintain sitting without assist   Standing balance support: During functional activity;Bilateral upper extremity supported;Single extremity supported Standing balance-Leahy Scale: Poor Standing balance comment: dependent on RW and external support                           ADL either performed or assessed with clinical judgement   ADL Overall ADL's : Needs assistance/impaired                         Toilet Transfer: Moderate assistance;Stand-pivot           Functional mobility during ADLs: Moderate assistance;+2 for safety/equipment;+2 for physical assistance;Rolling walker;Cueing for sequencing General ADL Comments: fatigues quickly, very unsteady on her feet, watch for feet to push back in an odd fashion     Vision       Perception     Praxis      Cognition Arousal/Alertness: Awake/alert Behavior  During Therapy: Flat affect Overall Cognitive Status: No family/caregiver present to determine baseline cognitive functioning Area of Impairment: Problem solving                       Following Commands: Follows one step commands with increased time       General Comments: slow processing, able to follow simple commands        Exercises     Shoulder Instructions       General Comments      Pertinent Vitals/ Pain       Pain Assessment: Faces Faces Pain Scale: Hurts little more Pain Location:  generalized Pain Descriptors / Indicators: Dull;Grimacing Pain Intervention(s): Monitored during session;Repositioned  Home Living                                          Prior Functioning/Environment              Frequency  Min 2X/week        Progress Toward Goals  OT Goals(current goals can now be found in the care plan section)  Progress towards OT goals: Progressing toward goals  Acute Rehab OT Goals Patient Stated Goal: Wants to get home as soon as she can. OT Goal Formulation: With patient Time For Goal Achievement: 07/07/18 Potential to Achieve Goals: Good  Plan Discharge plan needs to be updated;Frequency remains appropriate    Co-evaluation    PT/OT/SLP Co-Evaluation/Treatment: Yes Reason for Co-Treatment: For patient/therapist safety;To address functional/ADL transfers PT goals addressed during session: Mobility/safety with mobility;Balance;Proper use of DME OT goals addressed during session: ADL's and self-care      AM-PAC OT "6 Clicks" Daily Activity     Outcome Measure   Help from another person eating meals?: None Help from another person taking care of personal grooming?: A Little Help from another person toileting, which includes using toliet, bedpan, or urinal?: A Lot Help from another person bathing (including washing, rinsing, drying)?: A Lot Help from another person to put on and taking off regular upper body clothing?: A Little Help from another person to put on and taking off regular lower body clothing?: A Lot 6 Click Score: 16    End of Session Equipment Utilized During Treatment: Gait belt;Rolling walker  OT Visit Diagnosis: Unsteadiness on feet (R26.81);Other abnormalities of gait and mobility (R26.89);Muscle weakness (generalized) (M62.81);Adult, failure to thrive (R62.7);Other symptoms and signs involving cognitive function   Activity Tolerance Patient tolerated treatment well   Patient Left in bed;with call  bell/phone within reach;with bed alarm set   Nurse Communication Mobility status        Time: 1550-1620 OT Time Calculation (min): 30 min  Charges: OT General Charges $OT Visit: 1 Visit OT Treatments $Self Care/Home Management : 8-22 mins  Hulda Humphrey OTR/L Acute Rehabilitation Services Pager: [phone] Office: [place] 06/25/2018, 4:45 PM

## 2018-06-25 NOTE — Progress Notes (Signed)
PROGRESS NOTE  Vanessa Webb BMW:[id number] DOB: February 09, 1946 DOA: 06/11/2018 PCP: Monico Blitz, MD  Brief Narrative: 73 year old female with history of ESRD on HD TTS, hypertension, hyperlipidemia, A. fib on Coumadin for anticoagulation and chronic right leg ulcer transferred from [facility] for hemodialysis. Patient presented to [facility] with 2 weeks of generalized weakness and found to be in A. fib with RVR. She was placed on diltiazem drip transition to amiodarone prior to transfer. Patient is dialysis on 4/14 due to weakness. Reports good compliance with her metoprolol.  Of note, patient has had poor appetite, generalized weakness and about 40 pound weight loss over the last 1 year. She fell and broke her right hip 6 months ago and underwent IM nailing. She also fell and broke her left shoulder 3 months ago on the way to dialysis that has been treated conservatively.  Patient remained in A. fib with RVR on amiodarone drip. Initially, plan for DCCV if heart rate is not under good control. This has changed after talking to CTS about the pleural effusion and noted to have Large right-sided pleural effusion  on chest x-ray and CT chest. These appear to be chronic. She had US-guided diagnostic and therapeutic right thoracocentesis on 4/17 that yielded 300 cc exudative fluid by LDH. Pleural fluid Gram stain and culture with GNR. AFB smear negative.Started on Unasyn and Flagyl. Cardiothoracic surgery consulted on 4/19 recommended stopping warfarin, INR reversal and chest tube placement by IR. Pleural fluid culture with Pseudomonas aeruginosa and transitioned to cefepime on 06/16/2018. 4/20: IR chest tube placement and Gram stain negative, cytology consistent with acute inflammation. 4/22: VATS. 4/26- abx changed to [place] 4/27- chest tube pulled out    HPI/Recap of past 24 hours:  Denies pain, on room air at rest, remain in afib/ rate controlled, bp stable, no edema Hypoglycemia this am  She  lives with her husband prior to this hospitalization, daughter lives close by She would like me to call her husband today  She desires to go to [facility], she is [place] with snf if she is declined by CIR  Assessment/Plan: Principal Problem:   Paroxysmal SVT (supraventricular tachycardia) ([place]) Active Problems:   ESRD (end stage renal disease) on dialysis ([place])   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   HTN (hypertension)   Hyperlipidemia   S/P right hip fracture   Pressure injury of skin   Mitral regurgitation   Chronic combined systolic and diastolic congestive heart failure (HCC)   Pleural effusion, bilateral   Empyema of pleural space ([place])   Left humeral fracture   Diarrhea   Trapped lung   Persistent atrial fibrillation  Empyema right chest with Pseudomonas , Community-acquired likely from pneumonia untreated: s/p VATS 4/22 W finding of 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 Tube out 4/27. Cont ceftaz 4/26 with HD-for 4 weeks of antibiotics from the time of her decortication,  tentative antibiotic stop date 07/13/18.Clinically stable afebrile but given trapped lung patient is at risk for pneumonia and empyema (poor prognosis)   Persistent atrial fibrillation : Rate has been difficult to control and initial plan was Cardioversion after 3 weeks of therapeutic INR.She has had challenges especially  with hypotension and RVR IN 10-130S  during dialysis limiting ultrafiltration. Continue with amiodarone, metoprolol.  Appreciate cardiology input. INR 2.1->3.0-being dosed by pharmacy.  ESRD  On HD TTS w anemia of chronic disease/mineral bone disease: nephrology on board for dialysis. last HD 4/24: bp  was in 70s-albumin  Was given, again difficulty with ultrafiltration due to hypotension A. fib tachycardia.per nephro.  Hypotension: BP seems stable now, started on Midodrine 10 mg pre HD, and prn halfway  Hyponatremia  improved with dialysis.  Protein calorie malnutrition severe, with hypoalbuminemia. Is not eating well and only minimal intake- changed to regular diet to increase food option.  Again encouraged oral intake.  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  Hallucination/Intermittent confusion as per the husband:  Now stable.She is off of Lomotil and Flagyl.  Type 2 DM, hba1c 5.9 4/17, well controlled, monitor CBG.  Pressure injury unstageable , RLE wound followed by wound care outpatient, POA,  12cm x 1.5cm x 0.2cm; 90% black soft eschar-Medial 4cm x 2.0cm x 0.2cm; 50% black soft eschar.present on admission, wound on left leg present on admission.cont wound care Cont wound care  Stage  II Sacral decubitus0.4cm x 0.2cm x 0.1cm; POA.  Scabbed without drainage.  Cont wound care  Hyperlipidemia:on lipitor.  Pulmonary nodule/mild right paratracheal lymphadenopathy 8 mm LLL pulmonary nodule seen on CT chest 8 follow-up in 3 to 6 months with CT chest.  Pleural fluid is negative for malignancy.  Recurrent fal/hx of right hip fractures, comminuted left proximal humerus fracture: status post IM nail of hip 6 months ago, left shoulder managed conservatively, continue pain control, PT OT  FTT: CIR vs SNF  Pending heart rate and blood pressure with physical therapy   DVT prophylaxis: SCD/ warfarin  Code Status: DNR  Family Communication: patient   Disposition Plan: CIR VS SNF   Consultants:  ID  IR for thoracentesis on 4/17 then chest tube placement on 4/20  Thoracic surgery Dr Roxy Manns for VATS of right empyema  Nephrology for HD TTS  Cardiology for afib/chf/mitral regurgitation  CIR  Wound care  Procedures:  Right sided chest tube placement on 4/20,   VAST right empyema on  4/22, chest tube removed on 4/27  HD  Antibiotics:  As above   Objective: BP  124/67 (BP Location: Right Arm)   Pulse 87   Temp (!) 97.2 F (36.2 C) (Oral)   Resp (!) 21   Ht 5\' 4"  (1.626 m)   Wt 51.3 kg   SpO2 96%   BMI 19.41 kg/m   Intake/Output Summary (Last 24 hours) at 06/25/2018 1130 Last data filed at 06/25/2018 0600 Gross per 24 hour  Intake 480 ml  Output 1000 ml  Net -520 ml   Filed Weights   06/24/18 0437 06/25/18 0220 06/25/18 0535  Weight: 50.7 kg 52.4 kg 51.3 kg    Exam: Patient is examined daily including today on 06/25/2018, exams remain the same as of yesterday except that has changed    General:  Frail, NAD, aaox3, no confusion today  Cardiovascular: IRRR  Respiratory: CTABL  Abdomen: Soft/ND/NT, positive BS  Musculoskeletal: No Edema  Neuro: alert, oriented   Data Reviewed: Basic Metabolic Panel: Recent Labs  Lab 06/19/18 0620 06/20/18 0500 06/21/18 0613 06/21/18 0812 06/22/18 0531 06/24/18 0236 06/25/18 0218  NA 135 135  --  134* 136 135 133*  K 4.9 3.8  --  3.9 4.3 4.5 4.0  CL 99 95*  --  99 97* 98 97*  CO2 24 27  --  25 28 26 28   GLUCOSE 137* 108*  --  124* 150* 74 48*  BUN 25* 15  --  23 9 20  27*  CREATININE 3.55* 2.54*  --  3.37* 2.10* 3.51* 4.40*  CALCIUM 7.8* 7.9*  --  7.5* 7.9* 7.9* 7.6*  MG 1.9 1.9 2.1  --  1.9  --   --   PHOS  --  2.9  --  3.6 2.3* 3.0 3.5   Liver Function Tests: Recent Labs  Lab 06/20/18 0500 06/21/18 0812 06/22/18 0531 06/24/18 0236 06/25/18 0218  AST 20  --   --   --   --   ALT 9  --   --   --   --   ALKPHOS 65  --   --   --   --   BILITOT 0.7  --   --   --   --   PROT 5.3*  --   --   --   --   ALBUMIN 1.6* 1.6* 2.1* 1.9* 1.8*   No results for input(s): LIPASE, AMYLASE in the last 168 hours. No results for input(s): AMMONIA in the last 168 hours. CBC: Recent Labs  Lab 06/19/18 0620 06/20/18 0500 06/21/18 0812 06/23/18 0247 06/25/18 0305  WBC 11.0* 10.5 7.4 6.7 8.4  HGB 9.1* 8.9* 8.5* 8.8* 8.0*  HCT 29.9* 29.6* 28.0* 29.4* 26.7*  MCV 86.4 87.3 87.0 88.0 87.0   PLT 130* 131* 114* 94* 91*   Cardiac Enzymes:   No results for input(s): CKTOTAL, CKMB, CKMBINDEX, TROPONINI in the last 168 hours. BNP (last 3 results) Recent Labs    06/11/18 1113  BNP 1,119.1*    ProBNP (last 3 results) No results for input(s): PROBNP in the last 8760 hours.  CBG: Recent Labs  Lab 06/24/18 1601 06/24/18 2059 06/25/18 0615 06/25/18 0633 06/25/18 0659  GLUCAP 233* 155* 46* 52* 140*    Recent Results (from the past 240 hour(s))  Surgical pcr screen     Status: None   Collection Time: 06/17/18  9:47 PM  Result Value Ref Range Status   MRSA, PCR NEGATIVE NEGATIVE Final   Staphylococcus aureus NEGATIVE NEGATIVE Final    Comment: (NOTE) The Xpert SA Assay (FDA approved for NASAL specimens in patients 55 years of age and older), is one component of a comprehensive surveillance program. It is not intended to diagnose infection nor to guide or monitor treatment. Performed at [facility], [place] [address] [place], [place] [address]      Studies: No results found.  Scheduled Meds: . amiodarone  200 mg Oral BID  . atorvastatin  20 mg Oral QHS  . Chlorhexidine Gluconate Cloth  6 each Topical Q0600  . collagenase   Topical Daily  . [START ON 06/28/2018] darbepoetin (ARANESP) injection - DIALYSIS  100 mcg Intravenous Q Sat-HD  . feeding supplement  1 Container Oral TID BM  . feeding supplement (PRO-STAT SUGAR FREE 64)  30 mL Oral BID  . Gerhardt's butt cream   Topical QID  . insulin aspart  0-24 Units Subcutaneous TID AC & HS  . metoprolol tartrate  25 mg Oral BID  . midodrine  10 mg Oral TID WC  . multivitamin  1 tablet Oral QHS  . Warfarin - Pharmacist Dosing Inpatient   Does not apply q1800    Continuous Infusions: . sodium chloride 500 mL (06/25/18 0646)  . cefTAZidime (FORTAZ)  IV 2 g (06/25/18 6045)     Time spent: 26mins, case discussed with cardiology/CIR I have personally reviewed and interpreted on  06/25/2018 daily labs, tele  strips, imagings as discussed above  under date review session and assessment and plans.  I reviewed all nursing notes, pharmacy notes, consultant notes,  vitals, pertinent old records  I have discussed plan of care as described above with RN , patient on 06/25/2018   Florencia Reasons MD, PhD  [facility] Pager [phone]. If 7PM-7AM, please contact night-coverage at [email or web address], password [facility] 06/25/2018, 11:30 AM  LOS: 13 days

## 2018-06-25 NOTE — Progress Notes (Signed)
ANTICOAGULATION CONSULT NOTE  + Antibiotic Consult Note  Pharmacy Consult for warfarin + ceftazidime Indication: atrial fibrillation / empyema  Allergies  Allergen Reactions  . Sulfa Antibiotics Nausea Only    Patient Measurements: Height: 5\' 4"  (162.6 cm) Weight: 113 lb 1.5 oz (51.3 kg) IBW/kg (Calculated) : 54.7  Vital Signs: Temp: 97.2 F (36.2 C) (04/29 0823) Temp Source: Oral (04/29 0823) BP: 124/67 (04/29 0823) Pulse Rate: 87 (04/29 0823)  Labs: Recent Labs    06/23/18 0247 06/24/18 0236 06/25/18 0218 06/25/18 0305  HGB 8.8*  --   --  8.0*  HCT 29.4*  --   --  26.7*  PLT 94*  --   --  91*  LABPROT 23.4* 30.7*  --  31.9*  INR 2.1* 3.0*  --  3.2*  CREATININE  --  3.51* 4.40*  --     Estimated Creatinine Clearance: 9.1 mL/min (A) (by C-G formula based on SCr of 4.4 mg/dL (H)).   Medical History: Past Medical History:  Diagnosis Date  . Anemia    takes Folic Acid;pt states she gets an injection every 2wks   . Chronic combined systolic and diastolic congestive heart failure ([place])   . Closed right hip fracture, initial encounter ([place]) 10/19/2017  . Depression    but doesn't take any meds for it  . Diabetes mellitus    takes Lantus and Novolog 70/30;fasting blood sugar 200;Type 2  . GERD (gastroesophageal reflux disease)    takes Omeprazole daily  . GI bleeding   . History of bladder infections    last one a month ago;saw Dr.Wrenn and cysto was done in office;was on antibiotics and completed 2wks ago  . History of blood transfusion    no blood transfusion  . HLD (hyperlipidemia) 10/19/2017  . HTN (hypertension) 10/19/2017  . Hyperlipidemia    takes Zocor daily  . Hypertension    takes Coreg daily  . Mitral regurgitation   . Myocardial infarction ([place]) 2010  . PAF (paroxysmal atrial fibrillation) ([place])   . Pancreatitis   . Peripheral neuropathy   . Pleural effusion, bilateral   . 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 UTI (lower urinary tract infection)      Assessment: 72 year old female s/p VATS 4/22. Patient with history of afib on warfarin prior to admission. Pharmacy has been consulted for warfarin dosing.   PTA warfarin regimen is 2.5 mg daily except 1.25 mg on Saturday. Admit INR was 4.  INR unchanged from yesterday, remains subtherapeutic at 1.4 - warfarin restarted on 4/23. Of note, ABLA noted, and pt was given Vitamin K IV 10 mg on 4/19 and 4/20, with an additional 5 mg IV x1 4/20. Additionally, pt has been on amiodarone since 4/15, which can increase warfarin concentrations and therefore INR. INR increased significantly to 3 yesterday and warfarin was held and enoxaparin D/C'd. INR today remains elevated at 3.2 despite holding a dose yesterday. Will hold warfarin one more day, can likely restart tomorrow.   ID: Cefepime started for pseudomonal empyema. Chest tube placement 4/20, VATs 4/22.  -WBC currently normal, afeb -ID consulted and has changed antibiotics to ceftazidime, will dose with HD. Last HD was overnight and ceftazidime was given.   Goal of Therapy:  INR 2-3 Monitor platelets by anticoagulation protocol: Yes   Plan:  -Hold warfarin again tonight -Daily INR -Continue ceftazidime 2g TTS with hemodialysis   Arrie Senate, PharmD, BCPS Clinical Pharmacist [phone] Please check AMION for all [place] numbers  06/25/2018  

## 2018-06-25 NOTE — Progress Notes (Signed)
Patient ID: Vanessa Webb, female   DOB: Jan 06, 1946, 73 y.o.   MRN: [id number] [facility] Progress Note   Assessment/ Plan:   1.  Atrial fibrillation with rapid ventricular response:  per cardiology; metoprolol and amio.  For future elective cardioversion per cardiology.  Note that hypotension has limited UF with HD in the past and HR running 100-130's with HD.  2.  End-stage renal disease: HD is per TTS schedule - Normally at [facility] AVF - HD 4/30: 3K, UF 1-2L as able SBP > 90.  Midodrine 10mg  pre HD and prn at half way point, no heparin; albumin could be used as well  3. Pleural effusion with pseudomonal empyema - s/p VATS.  chest tube removed 4/27. TCTX and RCID have consulted.  ON ceftazidime  4.  Hyponatremia: improved with HD/UF   5. 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  aranesp given 4/24 q Sat; low dose and driting down, inc ot 122mcg Aranesp weekly.  High ferritin, no IV Fe at this time  5.  Secondary hyperparathyroidism: follow calcium/phosphorus levels. Stable without binder  6.  Protein calorie malnutrition: Significant hypoalbuminemia noted most possibly arising from nonhealing leg ulcer/acute phase reactant.  Continue renal vitamin/ONS.  7.  Hallucinations: off of Lomotil and metronidazole   Subjective:    HD overnight: 1L UF, post weight 51.3kg  Seems BP is stable  CIR beign considered    Objective:   BP 124/67 (BP Location: Right Arm)   Pulse 87   Temp (!) 97.2 F (36.2 C) (Oral)   Resp (!) 21   Ht 5\' 4"  (1.626 m)   Wt 51.3 kg   SpO2 96%   BMI 19.41 kg/m   Physical Exam: Gen: NAD  CVS: irregularly irregular; no rub; normal rate Resp: right lung reduced breath sounds; left lung clear; unlabored Abd: Soft, flat, nontender Ext: no lower extremity edema; right arm less swollen Access: right AVF with bruit and thrill   Labs: BMET Recent Labs  Lab 06/19/18 0620 06/20/18 0500 06/21/18 0812 06/22/18 0531 06/24/18 0236  06/25/18 0218  NA 135 135 134* 136 135 133*  K 4.9 3.8 3.9 4.3 4.5 4.0  CL 99 95* 99 97* 98 97*  CO2 24 27 25 28 26 28   GLUCOSE 137* 108* 124* 150* 74 48*  BUN 25* 15 23 9 20  27*  CREATININE 3.55* 2.54* 3.37* 2.10* 3.51* 4.40*  CALCIUM 7.8* 7.9* 7.5* 7.9* 7.9* 7.6*  PHOS  --  2.9 3.6 2.3* 3.0 3.5   CBC Recent Labs  Lab 06/20/18 0500 06/21/18 0812 06/23/18 0247 06/25/18 0305  WBC 10.5 7.4 6.7 8.4  HGB 8.9* 8.5* 8.8* 8.0*  HCT 29.6* 28.0* 29.4* 26.7*  MCV 87.3 87.0 88.0 87.0  PLT 131* 114* 94* 91*   Medications:    . amiodarone  200 mg Oral BID  . atorvastatin  20 mg Oral QHS  . Chlorhexidine Gluconate Cloth  6 each Topical Q0600  . collagenase   Topical Daily  . [START ON 06/28/2018] darbepoetin (ARANESP) injection - DIALYSIS  40 mcg Intravenous Q Sat-HD  . feeding supplement  1 Container Oral TID BM  . feeding supplement (PRO-STAT SUGAR FREE 64)  30 mL Oral BID  . Gerhardt's butt cream   Topical QID  . insulin aspart  0-24 Units Subcutaneous TID AC & HS  . metoprolol tartrate  25 mg Oral BID  . midodrine  10 mg Oral TID WC  . multivitamin  1  tablet Oral QHS  . Warfarin - Pharmacist Dosing Inpatient   Does not apply q1800    Rexene Agent 06/25/2018

## 2018-06-25 NOTE — Progress Notes (Signed)
Pharmacy Antibiotic Note  Vanessa Webb is a 73 y.o. female admitted on 06/11/2018 with empyema with pseudomonas .  Pharmacy has been consulted for cefepime dosing. S/P VATs 4/22. Patient is ESRD with HD on TTS. WBC WNL and patient afebrile. Planned 4 week course from VATS with stop date 5/19.   Plan: Cefepime 2 gm Q TTh and 3 gm on Sat with HD Monitor HD sessions, clinical improvement  Height: 5\' 4"  (162.6 cm) Weight: 113 lb 1.5 oz (51.3 kg) IBW/kg (Calculated) : 54.7  Temp (24hrs), Avg:97.2 F (36.2 C), Min:97 F (36.1 C), Max:97.3 F (36.3 C)  Recent Labs  Lab 06/19/18 0620 06/20/18 0500 06/21/18 0812 06/22/18 0531 06/23/18 0247 06/24/18 0236 06/25/18 0218 06/25/18 0305  WBC 11.0* 10.5 7.4  --  6.7  --   --  8.4  CREATININE 3.55* 2.54* 3.37* 2.10*  --  3.51* 4.40*  --     Estimated Creatinine Clearance: 9.1 mL/min (A) (by C-G formula based on SCr of 4.4 mg/dL (H)).    Allergies  Allergen Reactions  . Sulfa Antibiotics Nausea Only     Thank you for allowing pharmacy to be a part of this patient's care.  Jimmy Footman, PharmD, BCPS, [place] [facility] Clinical Pharmacist Phone: [phone] 06/25/2018 12:05 PM

## 2018-06-25 NOTE — Progress Notes (Signed)
Progress Note  Patient Name: Vanessa Webb Date of Encounter: 06/25/2018  Primary Cardiologist: Carlyle Dolly, MD   Subjective   NO CP, sleeping comfortably.   Inpatient Medications    Scheduled Meds:  amiodarone  200 mg Oral BID   atorvastatin  20 mg Oral QHS   Chlorhexidine Gluconate Cloth  6 each Topical Q0600   collagenase   Topical Daily   [START ON 06/28/2018] darbepoetin (ARANESP) injection - DIALYSIS  100 mcg Intravenous Q Sat-HD   feeding supplement  1 Container Oral TID BM   feeding supplement (PRO-STAT SUGAR FREE 64)  30 mL Oral BID   Gerhardt's butt cream   Topical QID   insulin aspart  0-24 Units Subcutaneous TID AC & HS   metoprolol tartrate  25 mg Oral BID   midodrine  10 mg Oral TID WC   multivitamin  1 tablet Oral QHS   Warfarin - Pharmacist Dosing Inpatient   Does not apply q1800   Continuous Infusions:  sodium chloride 500 mL (06/25/18 0646)   cefTAZidime (FORTAZ)  IV 2 g (06/25/18 0647)   PRN Meds: sodium chloride, acetaminophen **OR** acetaminophen, acetaminophen, antiseptic oral rinse, bisacodyl, loperamide, morphine injection, ondansetron (ZOFRAN) IV, senna-docusate, traMADol   Vital Signs    Vitals:   06/25/18 0530 06/25/18 0535 06/25/18 0547 06/25/18 0823  BP: 115/86 115/86 (!) 124/59 124/67  Pulse:  68 68 87  Resp:  16  (!) 21  Temp:  (!) 97 F (36.1 C)  (!) 97.2 F (36.2 C)  TempSrc:  Oral  Oral  SpO2:  100%  96%  Weight:  51.3 kg    Height:        Intake/Output Summary (Last 24 hours) at 06/25/2018 1006 Last data filed at 06/25/2018 0600 Gross per 24 hour  Intake 480 ml  Output 1000 ml  Net -520 ml   Last 3 Weights 06/25/2018 06/25/2018 06/24/2018  Weight (lbs) 113 lb 1.5 oz 115 lb 8.3 oz 111 lb 12.4 oz  Weight (kg) 51.3 kg 52.4 kg 50.7 kg      Telemetry    80-90s again atrial fibrillation- Personally Reviewed  ECG    A. fib- Personally Reviewed  Physical Exam   Comfortable in bed, sleeping   Labs    Chemistry Recent Labs  Lab 06/20/18 0500  06/22/18 0531 06/24/18 0236 06/25/18 0218  NA 135   < > 136 135 133*  K 3.8   < > 4.3 4.5 4.0  CL 95*   < > 97* 98 97*  CO2 27   < > 28 26 28   GLUCOSE 108*   < > 150* 74 48*  BUN 15   < > 9 20 27*  CREATININE 2.54*   < > 2.10* 3.51* 4.40*  CALCIUM 7.9*   < > 7.9* 7.9* 7.6*  PROT 5.3*  --   --   --   --   ALBUMIN 1.6*   < > 2.1* 1.9* 1.8*  AST 20  --   --   --   --   ALT 9  --   --   --   --   ALKPHOS 65  --   --   --   --   BILITOT 0.7  --   --   --   --   GFRNONAA 18*   < > 23* 12* 9*  GFRAA 21*   < > 26* 14* 11*  ANIONGAP 13   < > 11 11  8   < > = values in this interval not displayed.     Hematology Recent Labs  Lab 06/21/18 0812 06/23/18 0247 06/25/18 0305  WBC 7.4 6.7 8.4  RBC 3.22* 3.34* 3.07*  HGB 8.5* 8.8* 8.0*  HCT 28.0* 29.4* 26.7*  MCV 87.0 88.0 87.0  MCH 26.4 26.3 26.1  MCHC 30.4 29.9* 30.0  RDW 17.4* 17.2* 17.2*  PLT 114* 94* 91*    Cardiac EnzymesNo results for input(s): TROPONINI in the last 168 hours. No results for input(s): TROPIPOC in the last 168 hours.   BNPNo results for input(s): BNP, PROBNP in the last 168 hours.   DDimer No results for input(s): DDIMER in the last 168 hours.   Radiology    Dg Chest Port 1 View  Result Date: 06/24/2018 CLINICAL DATA:  Trapped lung. EXAM: PORTABLE CHEST 1 VIEW COMPARISON:  June 23, 2018 FINDINGS: The right-sided chest tube has been removed. No pneumothorax. A small left pleural effusion is stable. Loculated effusion on the right with underlying opacity remains. The effusion and opacity at the right base are more prominent. Probable mild edema/pulmonary venous congestion. No other changes. IMPRESSION: 1. No pneumothorax after right chest tube removal. 2. Persistent loculated right pleural effusion with underlying opacity. The effusion and opacity at the right base is more prominent in the interval. 3. Small left effusion. 4. Mild pulmonary edema. Electronically  Signed   By: Dorise Bullion III M.D   On: 06/24/2018 08:29    Cardiac Studies   Echo 06/11/2018: -EF 40 to 45%, mild elevated pulmonary pressures moderate mitral regurgitation  Patient Profile     73 y.o. female end-stage renal disease on hemodialysis with persistent atrial fibrillation difficult rate control with transient hypotension on Coumadin with empyema chest tube, now removed  Assessment & Plan    Persistent atrial fibrillation - Continue amiodarone and attempt cardioversion 3 weeks after she maintains therapeutic INR from 2-3. Tx on 4/27 (needs 3 weeks prior to cardioversion attempt) - Current rates have been in the 90s mostly.  She is also been scheduled to get midodrine 10 mg pre-hemodialysis and PRN at halfway point by Dr. Pearson Grippe.   She has been noted in the past to have heart rates increasing to 100-130 during hemodialysis in the past.  Even with sinus rhythm, she certainly could have a degree of hypotension.  Even after cardioversion, at increased risk for atrial fibrillation once again.  End-stage renal disease -Nephrology notes reviewed.  Hemodialysis can be challenging secondary to hypotension.  Seems to be an ongoing issue.  No changes.  Empyema -  VATS, right empyema, chest tube.  This is also potentiating her hypotension.  Continuing with drainage, IV antibiotics.  Chest tube discontinued.  Prognosis remains poor.  No changes.     For questions or updates, please contact [place] Please consult [email or web address] for contact info under      Signed, Candee Furbish, MD  06/25/2018, 10:06 AM

## 2018-06-25 NOTE — Progress Notes (Signed)
Hypoglycemic Event  CBG: 46  Treatment: 8 oz juice/soda  Symptoms: Pale  Follow-up CBG: [id number] CBG Result:52  Possible Reasons for Event: Unknown/Occurred on return from HD  Follow-up treatment: 63mL D50 IV as patient refuses any POs at this time.  2nd follow-up CBG: Time: 4000       CBG Restult: [place]

## 2018-06-25 NOTE — Progress Notes (Signed)
Inpatient Diabetes Program Recommendations  AACE/ADA: New Consensus Statement on Inpatient Glycemic Control (2015)  Target Ranges:  Prepandial:   less than 140 mg/dL      Peak postprandial:   less than 180 mg/dL (1-2 hours)      Critically ill patients:  140 - 180 mg/dL   Lab Results  Component Value Date   GLUCAP 71 06/25/2018   HGBA1C 5.9 (H) 06/13/2018    Review of Glycemic Control Results for Vanessa Webb, Vanessa Webb (MRN [id number]) as of 06/25/2018 13:15  Ref. Range 06/25/2018 06:15 06/25/2018 06:33 06/25/2018 06:59 06/25/2018 12:00  Glucose-Capillary Latest Ref Range: 70 - 99 mg/dL 46 (L) 52 (L) 140 (H) 71   Diabetes history: Type 2 DM Outpatient Diabetes medications: Lantus 3-15 units QHS Current orders for Inpatient glycemic control: Novolog 0-24 units TID & HS  Inpatient Diabetes Program Recommendations:    Noted patient having episodes of hypoglycemia with this AM of 46 mg/dL. Consider reducing correction to Novolog 0-9 units TID.  MD text paged.   Thanks, Bronson Curb, MSN, RNC-OB Diabetes Coordinator [phone] (8a-5p)

## 2018-06-25 NOTE — Progress Notes (Signed)
Inpatient Rehabilitation-Admissions Coordinator   Based on latest therapy session, AC would agree with new acute therapy recommendations for SNF. Per PM&R MD consult note, vitals were recorded during treatment session with pt's vital signs indicating an inability to tolerate the intensity of a CIR program. [facility] spoke with pt bedside and explained reasoning behind SNF recommendation. Pt verbalized understanding and is requesting a SNF closer to her home in [place]. AC will communicate request with CM/SW.   AC will sign off.   Please call if questions.   Vanessa Webb, OTR/L  Rehab Admissions Coordinator  [phone] 06/25/2018 5:25 PM

## 2018-06-25 NOTE — Progress Notes (Signed)
Physical Therapy Treatment Patient Details Name: Vanessa Webb MRN: [id number] DOB: 08/12/1945 Today's Date: 06/25/2018    History of Present Illness  Vanessa Webb is a 73 y.o. female with medical history significant of ESRD on TTS schedule, hypertension, hyperlipidemia, type 2 diabetes mellitus, on chronic anticoagulation with Coumadin presents to the hospital with chief complaint of generalized weakness.  Found to be in Afib with RVR.  CT showed large right sided pleural effusion.  pt s/p right thoracocentensis on 4/17.  Chest tube placed by IR 4/19.  Pt s/p VATS for empyema with pseudomonas 4/22.    PT Comments    Pt was agreeable to doing a little therapy today.  She was generally moderate assist overall for bed mobility, sit to stand and short distance gait/pregait at the EOB.  During treatment BP's are as follows:  Sitting: max HR 103 BP 80/60 (73) Standing: max HR 117 BP 101/86 (92)  Walking trial #1: Max HR 134 BP 115/90 (100) Walking trail #2: max HR 127 BP 98/34 (54)  Due to MD note and  HR exceeding 120 with mobility, changed DC recommendation to SNF. Pt will continue to benefit from skilled PT in the acute setting as well as afterwards at the SNF level to maximize safety and independence in functional mobility.   Follow Up Recommendations  SNF;Supervision/Assistance - 24 hour     Equipment Recommendations  Other (comment)(TBA)    Recommendations for Other Services       Precautions / Restrictions Precautions Precautions: Fall Restrictions Weight Bearing Restrictions: No    Mobility  Bed Mobility Overal bed mobility: Needs Assistance Bed Mobility: Supine to Sit;Sit to Supine     Supine to sit: Mod assist Sit to supine: Mod assist   General bed mobility comments: More truncal assist to come forward up via right elbow than the need to assist the LE's into bed.  Scooting EOB in sitting with min guard.  Transfers Overall transfer level: Needs  assistance Equipment used: Rolling walker (2 wheeled) Transfers: Sit to/from Stand Sit to Stand: Mod assist         General transfer comment: able to perform sit<>stand x4 from EOB with mod A and vc for hand placement each time  Ambulation/Gait Ambulation/Gait assistance: Mod assist Gait Distance (Feet): 3 Feet(forward and back x2) Assistive device: Rolling walker (2 wheeled) Gait Pattern/deviations: Step-through pattern     General Gait Details: unsteady gait with uncoordinated/ataxic steps with weak pelvis and bil LE's   Stairs             Wheelchair Mobility    Modified Rankin (Stroke Patients Only)       Balance Overall balance assessment: Needs assistance Sitting-balance support: No upper extremity supported;Feet supported Sitting balance-Leahy Scale: Fair Sitting balance - Comments: able to maintain sitting without assist   Standing balance support: During functional activity;Bilateral upper extremity supported;Single extremity supported Standing balance-Leahy Scale: Poor Standing balance comment: dependent on RW and external support                            Cognition Arousal/Alertness: Awake/alert Behavior During Therapy: Flat affect Overall Cognitive Status: No family/caregiver present to determine baseline cognitive functioning Area of Impairment: Problem solving                       Following Commands: Follows one step commands with increased time       General Comments:  slow processing, able to follow simple commands      Exercises      General Comments        Pertinent Vitals/Pain Pain Assessment: Faces Faces Pain Scale: Hurts little more Pain Location: generalized Pain Descriptors / Indicators: Dull;Grimacing Pain Intervention(s): Monitored during session    Home Living                      Prior Function            PT Goals (current goals can now be found in the care plan section) Acute  Rehab PT Goals Patient Stated Goal: Wants to get home as soon as she can. PT Goal Formulation: With patient Time For Goal Achievement: 07/04/18 Potential to Achieve Goals: Fair Progress towards PT goals: Progressing toward goals    Frequency    Min 2X/week      PT Plan Discharge plan needs to be updated;Frequency needs to be updated    Co-evaluation PT/OT/SLP Co-Evaluation/Treatment: Yes Reason for Co-Treatment: For patient/therapist safety PT goals addressed during session: Mobility/safety with mobility OT goals addressed during session: ADL's and self-care      AM-PAC PT "6 Clicks" Mobility   Outcome Measure  Help needed turning from your back to your side while in a flat bed without using bedrails?: A Lot Help needed moving from lying on your back to sitting on the side of a flat bed without using bedrails?: A Lot Help needed moving to and from a bed to a chair (including a wheelchair)?: A Lot Help needed standing up from a chair using your arms (e.g., wheelchair or bedside chair)?: A Lot Help needed to walk in hospital room?: Total Help needed climbing 3-5 steps with a railing? : Total 6 Click Score: 10    End of Session   Activity Tolerance: Patient tolerated treatment well;Patient limited by fatigue Patient left: in bed;with call bell/phone within reach;Other (comment) Nurse Communication: Mobility status PT Visit Diagnosis: Other abnormalities of gait and mobility (R26.89);Muscle weakness (generalized) (M62.81);Difficulty in walking, not elsewhere classified (R26.2);Unsteadiness on feet (R26.81)     Time: 1550-1620 PT Time Calculation (min) (ACUTE ONLY): 30 min  Charges:  $Therapeutic Activity: 8-22 mins                     06/25/2018  Donnella Sham, PT Acute Rehabilitation Services [phone]  (pager) [phone]  (office)   Tessie Fass Anabelen Kaminsky 06/25/2018, 6:03 PM

## 2018-06-26 LAB — RENAL FUNCTION PANEL
Albumin: 1.8 g/dL — ABNORMAL LOW (ref 3.5–5.0)
Anion gap: 10 (ref 5–15)
BUN: 19 mg/dL (ref 8–23)
CO2: 24 mmol/L (ref 22–32)
Calcium: 7.7 mg/dL — ABNORMAL LOW (ref 8.9–10.3)
Chloride: 98 mmol/L (ref 98–111)
Creatinine, Ser: 3.67 mg/dL — ABNORMAL HIGH (ref 0.44–1.00)
GFR calc Af Amer: 13 mL/min — ABNORMAL LOW (ref >60)
GFR calc non Af Amer: 12 mL/min — ABNORMAL LOW (ref >60)
Glucose, Bld: 209 mg/dL — ABNORMAL HIGH (ref 70–99)
Phosphorus: 3.3 mg/dL (ref 2.5–4.6)
Potassium: 3.4 mmol/L — ABNORMAL LOW (ref 3.5–5.1)
Sodium: 132 mmol/L — ABNORMAL LOW (ref 135–145)

## 2018-06-26 LAB — GLUCOSE, CAPILLARY
Glucose-Capillary: 136 mg/dL — ABNORMAL HIGH (ref 70–99)
Glucose-Capillary: 88 mg/dL (ref 70–99)
Glucose-Capillary: 92 mg/dL (ref 70–99)
Glucose-Capillary: 156 mg/dL — ABNORMAL HIGH (ref 70–99)

## 2018-06-26 LAB — CBC
HCT: 31.1 % — ABNORMAL LOW (ref 36.0–46.0)
Hemoglobin: 9.6 g/dL — ABNORMAL LOW (ref 12.0–15.0)
MCH: 27 pg (ref 26.0–34.0)
MCHC: 30.9 g/dL (ref 30.0–36.0)
MCV: 87.4 fL (ref 80.0–100.0)
Platelets: 107 10*3/uL — ABNORMAL LOW (ref 150–400)
RBC: 3.56 MIL/uL — ABNORMAL LOW (ref 3.87–5.11)
RDW: 17.3 % — ABNORMAL HIGH (ref 11.5–15.5)
WBC: 9.8 10*3/uL (ref 4.0–10.5)
nRBC: 0 % (ref 0.0–0.2)

## 2018-06-26 LAB — PROTIME-INR
INR: 2.8 — ABNORMAL HIGH (ref 0.8–1.2)
Prothrombin Time: 29 seconds — ABNORMAL HIGH (ref 11.4–15.2)

## 2018-06-26 MED ORDER — WARFARIN SODIUM 2.5 MG PO TABS
2.5000 mg | ORAL_TABLET | Freq: Once | ORAL | Status: AC
  Administered 2018-06-26: 2.5 mg via ORAL
  Filled 2018-06-26: qty 1

## 2018-06-26 MED ORDER — NYSTATIN 100000 UNIT/ML MT SUSP
5.0000 mL | Freq: Four times a day (QID) | OROMUCOSAL | Status: DC
  Administered 2018-06-26 – 2018-06-27 (×4): 500000 [IU] via ORAL
  Filled 2018-06-26 (×4): qty 5

## 2018-06-26 MED ORDER — GERHARDT'S BUTT CREAM: 1.0000 "application " | TOPICAL_CREAM | Freq: Four times a day (QID) | CUTANEOUS | Status: AC

## 2018-06-26 MED ORDER — MIDODRINE HCL 5 MG PO TABS
ORAL_TABLET | ORAL | Status: AC
  Filled 2018-06-26: qty 2

## 2018-06-26 MED ORDER — MIDODRINE HCL 10 MG PO TABS: 10.0000 mg | ORAL_TABLET | Freq: Three times a day (TID) | ORAL | Status: AC

## 2018-06-26 MED ORDER — PRO-STAT SUGAR FREE PO LIQD: 30.0000 mL | Freq: Two times a day (BID) | ORAL | 0 refills | Status: AC

## 2018-06-26 MED ORDER — COLLAGENASE 250 UNIT/GM EX OINT: TOPICAL_OINTMENT | Freq: Every day | CUTANEOUS | 0 refills | Status: AC

## 2018-06-26 MED ORDER — CEFEPIME IV (FOR PTA / DISCHARGE USE ONLY): INTRAVENOUS | 0 refills | Status: AC

## 2018-06-26 MED ORDER — METOPROLOL TARTRATE 25 MG PO TABS: 25.0000 mg | ORAL_TABLET | Freq: Two times a day (BID) | ORAL | Status: AC

## 2018-06-26 MED ORDER — NYSTATIN 100000 UNIT/ML MT SUSP: 5.0000 mL | Freq: Four times a day (QID) | OROMUCOSAL | 0 refills | Status: AC

## 2018-06-26 MED ORDER — RENA-VITE PO TABS: 1.0000 | ORAL_TABLET | Freq: Every day | ORAL | 0 refills | Status: AC

## 2018-06-26 MED ORDER — AMIODARONE HCL 200 MG PO TABS: 200.0000 mg | ORAL_TABLET | Freq: Every day | ORAL | Status: AC

## 2018-06-26 NOTE — TOC Progression Note (Addendum)
Transition of Care [facility]) - Progression Note    Patient Details  Name: ANNIELEE JEMMOTT MRN: [id number] Date of Birth: 02-Nov-1945  Transition of Care [facility] CM/SW [place], [place] Phone Number: [phone] 06/26/2018, 12:16 PM  Clinical Narrative:     CSW spoke with the patient's spouse,Henry and he confirmed and accepted bed offer with [facility].If medically ready SNF can admit patient tomorrow [facility] [phone]). SNF states they will transport patient to dialysis. CSW spoke with PT and they confirm the patient should be able to be transported by [facility]i to HD clinic.   CSW updated RN and MD.  Thurmond Butts, MSW, [facility] Clinical Social Worker [phone]    Expected Discharge Plan: [place] Barriers to Discharge: Continued Medical Work up  Expected Discharge Plan and Services Expected Discharge Plan: [place] In-house Referral: NA Discharge Planning Services: CM Consult Post Acute Care Choice: [place] arrangements for the past 2 months: Single Family Home                 DME Arranged: N/A DME Agency: NA       HH Arranged: RN, Disease Management, PT [place] Agency: [place] ([facility]         Social Determinants of Health (SDOH) Interventions    Readmission Risk Interventions No flowsheet data found.

## 2018-06-26 NOTE — Progress Notes (Signed)
Physical Therapy Treatment Patient Details Name: Vanessa Webb MRN: [id number] DOB: Dec 14, 1945 Today's Date: 06/26/2018    History of Present Illness  Vanessa Webb is a 73 y.o. female with medical history significant of ESRD on TTS schedule, hypertension, hyperlipidemia, type 2 diabetes mellitus, on chronic anticoagulation with Coumadin presents to the hospital with chief complaint of generalized weakness.  Found to be in Afib with RVR.  CT showed large right sided pleural effusion.  pt s/p right thoracocentensis on 4/17.  Chest tube placed by IR 4/19.  Pt s/p VATS for empyema with pseudomonas 4/22.    PT Comments    Session limited by pt's fatigue.  Emphasis on bed exercise, transitions to EOB, sitting balance/tolerance EOB, sit to stand and stepping activity to transfer to the chair.    Follow Up Recommendations  SNF;Supervision/Assistance - 24 hour     Equipment Recommendations  Other (comment)(TBA)    Recommendations for Other Services       Precautions / Restrictions Precautions Precautions: Fall    Mobility  Bed Mobility Overal bed mobility: Needs Assistance Bed Mobility: Supine to Sit     Supine to sit: Mod assist     General bed mobility comments: Truncal assist up to elbow then stability while pt assisted with R UE.  Transfers Overall transfer level: Needs assistance Equipment used: Rolling walker (2 wheeled) Transfers: Sit to/from Stand Sit to Stand: Mod assist         General transfer comment: sit to stand x4 with cues for hand placement and forwar/boost assist  Ambulation/Gait Ambulation/Gait assistance: Mod assist;+2 safety/equipment Gait Distance (Feet): 2 Feet(forward, pivot and 2 feet over to a chair) Assistive device: Rolling walker (2 wheeled) Gait Pattern/deviations: Step-through pattern;Decreased step length - right;Decreased step length - left;Decreased stride length     General Gait Details: unsteady steps with trunk stability to help  coordinate steps forward and backward.   Stairs             Wheelchair Mobility    Modified Rankin (Stroke Patients Only)       Balance Overall balance assessment: Needs assistance   Sitting balance-Leahy Scale: Fair Sitting balance - Comments: able to maintain sitting without assist     Standing balance-Leahy Scale: Poor Standing balance comment: dependent on RW and external support                            Cognition Arousal/Alertness: Awake/alert Behavior During Therapy: Flat affect Overall Cognitive Status: No family/caregiver present to determine baseline cognitive functioning                         Following Commands: Follows one step commands with increased time     Problem Solving: Slow processing General Comments: slow processing, able to follow simple commands      Exercises Other Exercises Other Exercises: warm up ROM exercise prior to mobility  bil without, then with graded resistance ~15 reps Other Exercises: bicep/tricep presses x10 reps bil with limited stress on the L shoulder    General Comments General comments (skin integrity, edema, etc.): pt reports minor dizziness, but not BP taken.  Pt relate extreme fatigue.      Pertinent Vitals/Pain Pain Assessment: Faces Faces Pain Scale: Hurts little more Pain Location: sacrum Pain Descriptors / Indicators: Discomfort;Sore Pain Intervention(s): Monitored during session    Home Living  Prior Function            PT Goals (current goals can now be found in the care plan section) Acute Rehab PT Goals Patient Stated Goal: Wants to get home as soon as she can. PT Goal Formulation: With patient Time For Goal Achievement: 07/04/18 Potential to Achieve Goals: Fair Progress towards PT goals: Progressing toward goals    Frequency    Min 2X/week      PT Plan Current plan remains appropriate    Co-evaluation              AM-PAC  PT "6 Clicks" Mobility   Outcome Measure  Help needed turning from your back to your side while in a flat bed without using bedrails?: A Lot Help needed moving from lying on your back to sitting on the side of a flat bed without using bedrails?: A Lot Help needed moving to and from a bed to a chair (including a wheelchair)?: A Lot Help needed standing up from a chair using your arms (e.g., wheelchair or bedside chair)?: A Lot Help needed to walk in hospital room?: Total Help needed climbing 3-5 steps with a railing? : Total 6 Click Score: 10    End of Session   Activity Tolerance: Patient tolerated treatment well;Patient limited by fatigue Patient left: in chair;with call bell/phone within reach;with chair alarm set Nurse Communication: Mobility status PT Visit Diagnosis: Other abnormalities of gait and mobility (R26.89);Muscle weakness (generalized) (M62.81);Difficulty in walking, not elsewhere classified (R26.2);Unsteadiness on feet (R26.81)     Time: 9379-0240 PT Time Calculation (min) (ACUTE ONLY): 26 min  Charges:  $Therapeutic Exercise: 8-22 mins $Therapeutic Activity: 8-22 mins                     06/26/2018  Donnella Sham, PT Acute Rehabilitation Services [phone]  (pager) [phone]  (office)   Tessie Fass Saheed Carrington 06/26/2018, 1:23 PM

## 2018-06-26 NOTE — Progress Notes (Signed)
PHARMACY CONSULT NOTE FOR:  OUTPATIENT  PARENTERAL ANTIBIOTIC THERAPY (OPAT)  Indication: Pseudomonas Empyema  Regimen:Cefepime 2 gm Q TTh with HD  + 3 gm Sat with HD   End date: 07/15/2018  IV antibiotic discharge orders are pended. To discharging provider:  please sign these orders via discharge navigator,  Select New Orders & click on the button choice - Manage This Unsigned Work.     Thank you for allowing pharmacy to be a part of this patient's care.  Jimmy Footman, PharmD, BCPS, BCIDP Infectious Diseases Clinical Pharmacist Phone: [phone] 06/26/2018, 10:04 AM

## 2018-06-26 NOTE — Telephone Encounter (Signed)
06/26/18-pt currently hospitalized.

## 2018-06-26 NOTE — Progress Notes (Signed)
PROGRESS NOTE  Vanessa Webb TMH:[id number] DOB: 1945-04-10 DOA: 06/11/2018 PCP: Monico Blitz, MD  Brief Narrative: 73 year old female with history of ESRD on HD TTS, hypertension, hyperlipidemia, A. fib on Coumadin for anticoagulation and chronic right leg ulcer transferred from [facility] for hemodialysis. Patient presented to [facility] with 2 weeks of generalized weakness and found to be in A. fib with RVR. She was placed on diltiazem drip transition to amiodarone prior to transfer. Patient is dialysis on 4/14 due to weakness. Reports good compliance with her metoprolol.  Of note, patient has had poor appetite, generalized weakness and about 40 pound weight loss over the last 1 year. She fell and broke her right hip 6 months ago and underwent IM nailing. She also fell and broke her left shoulder 3 months ago on the way to dialysis that has been treated conservatively.  Patient remained in A. fib with RVR on amiodarone drip. Initially, plan for DCCV if heart rate is not under good control. This has changed after talking to CTS about the pleural effusion and noted to have Large right-sided pleural effusion  on chest x-ray and CT chest. These appear to be chronic. She had US-guided diagnostic and therapeutic right thoracocentesis on 4/17 that yielded 300 cc exudative fluid by LDH. Pleural fluid Gram stain and culture with GNR. AFB smear negative.Started on Unasyn and Flagyl. Cardiothoracic surgery consulted on 4/19 recommended stopping warfarin, INR reversal and chest tube placement by IR. Pleural fluid culture with Pseudomonas aeruginosa and transitioned to cefepime on 06/16/2018. 4/20: IR chest tube placement and Gram stain negative, cytology consistent with acute inflammation. 4/22: VATS. 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 Tube out 4/27.  -Patient is ESRD with HD on TTS. WBC WNL and patient afebrile. Planned 4 week course from VATS with stop date 5/19 Plan: Cefepime 2 gm Q TTh and 3 gm on Sat with HD -appreciate pharmacy and ID input Clinically stable afebrile but given trapped lung patient is at risk for pneumonia and empyema (poor prognosis)  Pulmonary nodule/mild right paratracheal lymphadenopathy 8 mm LLL pulmonary nodule seen on CT chest 8 follow-up in 3 to 6 months with CT chest.  Pleural fluid is negative for malignancy.    Thrombocytopenia: Seems to be chronic and fluctuating for year, the lowest in the record was in the 80's from 2018  She is not on heparin, check b12/folate monitor   Persistent atrial fibrillation : Rate has been difficult to control and initial plan was Cardioversion after 3 weeks of therapeutic INR.She has had challenges especially  with hypotension and RVR IN 10-130S  during dialysis limiting ultrafiltration. Continue with  amiodarone, metoprolol.  coumadin per pharmacy INR 2.8 Appreciate cardiology /pharmacy input.   Acute on chronic combined CHF with EF 40 to 45% down from 55 to 60% in January 2018.Fluid being managed with dialysis   ESRD  On HD TTS w anemia of chronic disease/mineral bone disease: nephrology on board for dialysis.   Hypotension: BP seems stable now, started on Midodrine 10 mg TID , and prn halfway  Hyponatremia improved with dialysis.  Diet controlled Type 2 DM with hypoglycemia,  a1c 5.9 4/17,  Hyperlipidemia:on lipitor.  Protein calorie malnutrition severe, with hypoalbuminemia. Is not eating well and only minimal intake- changed to regular diet to increase food option.  Again encouraged oral intake.  Hallucination/Intermittent confusion as per the husband:  Now stable.She is off of Lomotil and Flagyl.   Pressure injury unstageable , RLE wound followed by wound care outpatient, POA,  12cm x 1.5cm x 0.2cm; 90% black soft eschar-Medial 4cm x 2.0cm x 0.2cm; 50% black soft eschar.present on admission, wound on left leg present on admission.cont wound care Cont wound care  Stage  II Sacral decubitus0.4cm x 0.2cm x 0.1cm; POA.  Scabbed without drainage.  Cont wound care    Recurrent fal/hx of right hip fractures, comminuted left proximal humerus fracture: status post IM nail of hip 6 months ago, left shoulder managed conservatively, continue pain control, PT OT  FTT: not able to tolerate CIR, she is to discharge to SNF    DVT prophylaxis: SCD/ warfarin  Code Status: DNR  Family Communication: patient   Disposition Plan: SNF on 4/30   Consultants:  ID  IR for thoracentesis on 4/17 then chest tube placement on 4/20  Thoracic surgery Dr Roxy Manns for VATS of right empyema  Nephrology for HD TTS  Cardiology for afib/chf/mitral regurgitation  CIR  Wound care  Procedures:  Right sided chest tube placement on 4/20,   VAST right empyema on  4/22, chest tube  removed on 4/27  HD  Antibiotics:  As above   Objective: BP (!) 133/55 (BP Location: Left Arm)   Pulse 80   Temp (!) 97.1 F (36.2 C) (Axillary)   Resp 14   Ht 5\' 4"  (1.626 m)   Wt 50.4 kg   SpO2 100%   BMI 19.07 kg/m   Intake/Output Summary (Last 24 hours) at 06/26/2018 0700 Last data filed at 06/25/2018 1900 Gross per 24 hour  Intake 70.23 ml  Output -  Net 70.23 ml   Filed Weights   06/25/18 0220 06/25/18 0535 06/26/18 0500  Weight: 52.4 kg 51.3 kg 50.4 kg    Exam: Patient is examined daily including today on 06/26/2018, exams remain the same as of yesterday except that has changed    General:  Frail, NAD, aaox3, no confusion today  Cardiovascular: IRRR  Respiratory: CTABL  Abdomen: Soft/ND/NT, positive BS  Musculoskeletal: No Edema  Neuro: alert, oriented   Data Reviewed: Basic Metabolic Panel: Recent Labs  Lab 06/20/18 0500 06/21/18 0613 06/21/18 0812 06/22/18 0531 06/24/18 0236 06/25/18 0218  NA 135  --  134* 136 135 133*  K 3.8  --  3.9 4.3 4.5 4.0  CL 95*  --  99 97* 98 97*  CO2 27  --  25 28 26 28   GLUCOSE 108*  --  124* 150* 74 48*  BUN 15  --  23 9 20  27*  CREATININE 2.54*  --  3.37* 2.10* 3.51* 4.40*  CALCIUM 7.9*  --  7.5* 7.9* 7.9* 7.6*  MG 1.9 2.1  --  1.9  --   --   PHOS 2.9  --  3.6 2.3* 3.0 3.5   Liver Function Tests: Recent Labs  Lab 06/20/18 0500 06/21/18 0812 06/22/18 0531 06/24/18 0236 06/25/18 0218  AST 20  --   --   --   --   ALT 9  --   --   --   --   ALKPHOS 65  --   --   --   --   BILITOT 0.7  --   --   --   --   PROT 5.3*  --   --   --   --   ALBUMIN 1.6* 1.6* 2.1* 1.9* 1.8*   No results for input(s): LIPASE, AMYLASE in the last 168 hours. No results for input(s): AMMONIA in the last 168 hours. CBC: Recent Labs  Lab 06/20/18 0500 06/21/18 0812 06/23/18 0247 06/25/18 0305  WBC 10.5 7.4 6.7 8.4  HGB 8.9* 8.5* 8.8* 8.0*  HCT 29.6* 28.0* 29.4* 26.7*  MCV 87.3 87.0 88.0 87.0  PLT 131* 114* 94* 91*    Cardiac Enzymes:   No results for input(s): CKTOTAL, CKMB, CKMBINDEX, TROPONINI in the last 168 hours. BNP (last 3 results) Recent Labs    06/11/18 1113  BNP 1,119.1*    ProBNP (last 3 results) No results for input(s): PROBNP in the last 8760 hours.  CBG: Recent Labs  Lab 06/25/18 0659 06/25/18 1200 06/25/18 1729 06/25/18 2141 06/26/18 0602  GLUCAP 140* 71 81 206* 136*    Recent Results (from the past 240 hour(s))  Surgical pcr screen     Status: None   Collection Time: 06/17/18  9:47 PM  Result Value Ref Range Status   MRSA, PCR NEGATIVE NEGATIVE Final   Staphylococcus aureus NEGATIVE NEGATIVE Final    Comment: (NOTE) The Xpert SA Assay (FDA approved for NASAL specimens in patients 6 years of age and older), is one component of a comprehensive surveillance program. It is not intended to diagnose infection nor to guide or monitor treatment. Performed at [facility], [place] [address] [place], [place] [address]      Studies: No results found.  Scheduled Meds: . amiodarone  200 mg Oral BID  . atorvastatin  20 mg Oral QHS  . Chlorhexidine Gluconate Cloth  6 each Topical Q0600  . collagenase   Topical Daily  . [START ON 06/28/2018] darbepoetin (ARANESP) injection - DIALYSIS  100 mcg Intravenous Q Sat-HD  . feeding supplement  1 Container Oral TID BM  . feeding supplement (PRO-STAT SUGAR FREE 64)  30 mL Oral BID  . Gerhardt's butt cream   Topical QID  . insulin aspart  0-9 Units Subcutaneous TID WC  . metoprolol tartrate  25 mg Oral BID  . midodrine  10 mg Oral TID WC  . multivitamin  1 tablet Oral QHS  . Warfarin - Pharmacist Dosing Inpatient   Does not apply q1800    Continuous Infusions: . sodium chloride Stopped (06/25/18 1431)  . ceFEPime (MAXIPIME) IV    . [START ON 06/28/2018] ceFEPime (MAXIPIME) IV       Time spent: 88mins, I have personally reviewed and interpreted on  06/26/2018 daily labs,  tele strips, imagings as discussed above under date  review session and assessment and plans.  I reviewed all nursing notes, pharmacy notes, consultant notes,  vitals, pertinent old records  I have discussed plan of care as described above with RN , patient on 06/26/2018   Florencia Reasons MD, PhD  [facility] Pager [phone]. If 7PM-7AM, please contact night-coverage at [email or web address], password [facility] 06/26/2018, 7:00 AM  LOS: 14 days

## 2018-06-26 NOTE — Progress Notes (Signed)
[facility] for Infectious Disease   Reason for visit: Follow up on empyema  Antimicrobials: Cefepime, day 8  Interval History:  Exercised with PT for about 10 minutes. Due to weakened state, pt was declined for inpt rehab and CM pursuing SNF placement instead. Appetite remains extremely poor but is drinking protein juice-based drinks. Reminded pt not to sleep through mealtime to ensure her food is warm.  Physical Exam: Constitutional: chronically ill, small stature, A&O x 3 Vitals:   06/25/18 2357 06/26/18 0339  BP: 137/72 (!) 133/55  Pulse: (!) 110 80  Resp: 15 14  Temp: (!) 97.1 F (36.2 C)   SpO2: 100% 100%  EYES: anicteric, PERRL, EOMI ENMT:dry MM, OP clear, mild bitemporal wasting Neck: mild JVD, supple Cardiovascular:NRRR, no murmurs appreciated Respiratory:decreased BS at RT base OTW CTA throughout, no wheeze UD:JSHF, NTND, +BS Musculoskeletal:normal muscle tone, slight atrophy Extrems: no LE edema, RT arm AVF with good palpable thrill and audible bruit Skin:poor skin turgor, no rashes Neuro: CN II-XII grossly intact, no focal neurologic deficits, gait was not assessed as pt evaluated on HD Lines: PIV   Review of Systems: +malaise, FTT x 3 weeks PTA, +DOE and pleuritic CP x 1 week PTA, no F/C, no N/V/D, no HA, +anuric, continued poor appetite All other systems reviewed and are negative  Lab Results  Component Value Date   WBC 8.4 06/25/2018   HGB 8.0 (L) 06/25/2018   HCT 26.7 (L) 06/25/2018   MCV 87.0 06/25/2018   PLT 91 (L) 06/25/2018    Lab Results  Component Value Date   CREATININE 4.40 (H) 06/25/2018   BUN 27 (H) 06/25/2018   NA 133 (L) 06/25/2018   K 4.0 06/25/2018   CL 97 (L) 06/25/2018   CO2 28 06/25/2018    Lab Results  Component Value Date   ALT 9 06/20/2018   AST 20 06/20/2018   ALKPHOS 65 06/20/2018     Microbiology: Recent Results (from the past 240 hour(s))  Surgical pcr screen     Status: None   Collection Time:  06/17/18  9:47 PM  Result Value Ref Range Status   MRSA, PCR NEGATIVE NEGATIVE Final   Staphylococcus aureus NEGATIVE NEGATIVE Final    Comment: (NOTE) The Xpert SA Assay (FDA approved for NASAL specimens in patients 76 years of age and older), is one component of a comprehensive surveillance program. It is not intended to diagnose infection nor to guide or monitor treatment. Performed at [facility], [place] [address], [place], [place] [address]     Impression/Plan: The patient is a 73 y/o WF diabetic with ESRD on HD admitted to OSH with FTT in Afib with RVR found to have a RT sided pleural effusion transitioning to an empyema/trapped lung, s/p VATS with chest tube drainage.  1. Empyema -while Pseudomonas isanunusual community-acquired pathogen, this was isolated from her pleural fluid culture following her thoracentesis. Patient denies receiving any antecedent antibiotics prior to her admission despite her prolonged generalized illness. Interestingly, the patient did not report dyspnea or cough so much as pleuritic chest pain in the 3 weeks prior to her admission here. She underwent successful VATS/decortication for empyema on April 22 and now has a right-sided chest tube that remains intact. Will defer management of her chest tube to CT surgery. Continue cefepime monotherapy but will change from daily dosing to dosing on 2-2-3 protocol after HD. Given the patient's A. fib with RVR recently, I would be cautious using a quinolone  only for outpatient treatment of her pathogen. Her isolate was pansensitive. I do suspect that the patient developed this complication as a result of a community-acquired pneumonia for which she did not seek initial care. Would anticipate a 4-week course of antibiotics in total from the time of her decortication with repeat chest imaging used to assist in determining final duration of treatment. Tentative ABX d/c date is 07/15/2018. > 20 minutes of time spent  on d/c planning alone today.  2. Leukocytosis -the patient's white blood cell count peaked around approximately 20,000 prior to her thoracentesis. Since evacuation of her fluid collection and particularly since her VATS the patient's white blood cell count is now normalized along with empiric antibiotics. Check this patient CBC with differential regularly now that she has her white blood cell count returning to normal limits.  3. ESRD - Thepatient normally dialyzes Tuesday,Thursday,Saturday as an outpatient. While parenteral therapy would be preferred given that she has a gram-negative pathogen,ordinarily she would require daily dosing of IV antibiotics and therefore would require a second IV line. In order to avoid a secon line in an HD patient, will attempt a schedule dosing only after HD on a 2-2-3 cefepime regimen. Would request that nephrology notify ID team of any straying from her normal HD schedule as this will impact her ABX schedule as well and may require additional dosing to bridge.  OPAT ORDERS:  Diagnosis: Pseudomonal empyema  Culture Result: Pseudomonas from pleural fluid  Allergies  Allergen Reactions   Sulfa Antibiotics Nausea Only    Discharge antibiotics: Cefepime 2-2-3 protocol Per pharmacy protocol for HD Duration: 4 weeks End Date: 07/15/2018  [facility] Care and Maintenance Per Protocol _X_ Please pull PIC at completion of IV antibiotics __ Please leave PIC in place until doctor has seen patient or been notified  Labs weekly while on IV antibiotics: _X_ CBC with differential _X_ BMP __ BMP TWICE WEEKLY** __ CMP __ CRP __ ESR __ Vancomycin trough  Fax weekly labs to [phone] c/o Dr. Prince Rome  Clinic Follow Up Appt: In 2 weeks, prior to cessation of ABX

## 2018-06-26 NOTE — Progress Notes (Signed)
Progress Note  Patient Name: Vanessa Webb Date of Encounter: 06/26/2018  Primary Cardiologist: Carlyle Dolly, MD   Subjective   Comfortable in bed, eating breakfast.  No chest pain, no shortness of breath.  Inpatient Medications    Scheduled Meds: . amiodarone  200 mg Oral BID  . atorvastatin  20 mg Oral QHS  . Chlorhexidine Gluconate Cloth  6 each Topical Q0600  . collagenase   Topical Daily  . [START ON 06/28/2018] darbepoetin (ARANESP) injection - DIALYSIS  100 mcg Intravenous Q Sat-HD  . feeding supplement  1 Container Oral TID BM  . feeding supplement (PRO-STAT SUGAR FREE 64)  30 mL Oral BID  . Gerhardt's butt cream   Topical QID  . insulin aspart  0-9 Units Subcutaneous TID WC  . metoprolol tartrate  25 mg Oral BID  . midodrine  10 mg Oral TID WC  . multivitamin  1 tablet Oral QHS  . Warfarin - Pharmacist Dosing Inpatient   Does not apply q1800   Continuous Infusions: . sodium chloride Stopped (06/25/18 1431)  . ceFEPime (MAXIPIME) IV    . [START ON 06/28/2018] ceFEPime (MAXIPIME) IV     PRN Meds: sodium chloride, acetaminophen **OR** acetaminophen, acetaminophen, antiseptic oral rinse, bisacodyl, loperamide, morphine injection, ondansetron (ZOFRAN) IV, senna-docusate, traMADol   Vital Signs    Vitals:   06/25/18 2149 06/25/18 2357 06/26/18 0339 06/26/18 0500  BP: 126/68 137/72 (!) 133/55   Pulse: 98 (!) 110 80   Resp:  15 14   Temp:  (!) 97.1 F (36.2 C)    TempSrc:  Axillary    SpO2:  100% 100%   Weight:    50.4 kg  Height:        Intake/Output Summary (Last 24 hours) at 06/26/2018 0857 Last data filed at 06/25/2018 1900 Gross per 24 hour  Intake 70.23 ml  Output -  Net 70.23 ml   Last 3 Weights 06/26/2018 06/25/2018 06/25/2018  Weight (lbs) 111 lb 1.8 oz 113 lb 1.5 oz 115 lb 8.3 oz  Weight (kg) 50.4 kg 51.3 kg 52.4 kg      Telemetry    Atrial fibrillation heart rate in the 80s- Personally Reviewed  ECG    No new- Personally Reviewed   Physical Exam   GEN: No acute distress thin, in bed.   Neck: No JVD Cardiac:  No significant edema Respiratory:  Normal respiratory effort. GI: Soft, nontender, non-distended  MS: No edema; No deformity. Neuro:  Nonfocal  Psych: Normal affect   Labs    Chemistry Recent Labs  Lab 06/20/18 0500  06/22/18 0531 06/24/18 0236 06/25/18 0218  NA 135   < > 136 135 133*  K 3.8   < > 4.3 4.5 4.0  CL 95*   < > 97* 98 97*  CO2 27   < > 28 26 28   GLUCOSE 108*   < > 150* 74 48*  BUN 15   < > 9 20 27*  CREATININE 2.54*   < > 2.10* 3.51* 4.40*  CALCIUM 7.9*   < > 7.9* 7.9* 7.6*  PROT 5.3*  --   --   --   --   ALBUMIN 1.6*   < > 2.1* 1.9* 1.8*  AST 20  --   --   --   --   ALT 9  --   --   --   --   ALKPHOS 65  --   --   --   --  BILITOT 0.7  --   --   --   --   GFRNONAA 18*   < > 23* 12* 9*  GFRAA 21*   < > 26* 14* 11*  ANIONGAP 13   < > 11 11 8    < > = values in this interval not displayed.     Hematology Recent Labs  Lab 06/21/18 0812 06/23/18 0247 06/25/18 0305  WBC 7.4 6.7 8.4  RBC 3.22* 3.34* 3.07*  HGB 8.5* 8.8* 8.0*  HCT 28.0* 29.4* 26.7*  MCV 87.0 88.0 87.0  MCH 26.4 26.3 26.1  MCHC 30.4 29.9* 30.0  RDW 17.4* 17.2* 17.2*  PLT 114* 94* 91*    Cardiac EnzymesNo results for input(s): TROPONINI in the last 168 hours. No results for input(s): TROPIPOC in the last 168 hours.   BNPNo results for input(s): BNP, PROBNP in the last 168 hours.   DDimer No results for input(s): DDIMER in the last 168 hours.   Radiology    No results found.  Cardiac Studies   ECHO 06/11/18:  1. The left ventricle has mild-moderately reduced systolic function, with an ejection fraction of 40-45%. The cavity size was normal. There is mildly increased left ventricular wall thickness. Left ventricular diastolic Doppler parameters are consistent  with impaired relaxation. Left ventricular diffuse hypokinesis.  2. The right ventricle has normal systolc function. Right ventricular systolic  pressure is mildly elevated with an estimated pressure of 41.7 mmHg.  3. The mitral valve is degenerative. Moderate thickening of the mitral valve leaflet. Moderate calcification of the mitral valve leaflet. Mitral valve regurgitation is moderate by color flow Doppler.  4. The aortic valve is tricuspid Moderate thickening of the aortic valve Moderate calcification of the aortic valve.  5. The aortic root is normal in size and structure.  Patient Profile     73 y.o. female with end-stage renal disease on hemodialysis with empyema status post chest tube, pseudomonal infection on antibiotics IV with persistent atrial fibrillation prior difficult rate control with periodic hypotension during hemodialysis.  Assessment & Plan    Persistent atrial fibrillation - She is doing very well with rate control currently - When she is discharged from this hospitalization, decrease her amiodarone to 200 mg once a day. - After 3 weeks of INR is greater than 2 we could consider cardioversion however given her excellent rate control currently we may not need to proceed with this at this time.  End-stage renal disease on hemodialysis -midodrine surrounding hemodialysis.  Hypotension noted at times.  Empyema -Status post chest tube.  IV antibiotics.  Infectious disease note reviewed.     For questions or updates, please contact [place] Please consult [email or web address] for contact info under        Signed, Candee Furbish, MD  06/26/2018, 8:57 AM

## 2018-06-26 NOTE — Progress Notes (Signed)
ANTICOAGULATION CONSULT NOTE  + Antibiotic Consult Note  Pharmacy Consult for warfarin  Indication: atrial fibrillation / empyema  Allergies  Allergen Reactions  . Sulfa Antibiotics Nausea Only    Patient Measurements: Height: 5\' 4"  (162.6 cm) Weight: 111 lb 1.8 oz (50.4 kg) IBW/kg (Calculated) : 54.7  Vital Signs: Temp: 97.1 F (36.2 C) (04/29 2357) Temp Source: Axillary (04/29 2357) BP: 133/55 (04/30 0339) Pulse Rate: 80 (04/30 0339)  Labs: Recent Labs    06/24/18 0236 06/25/18 0218 06/25/18 0305 06/26/18 0235  HGB  --   --  8.0*  --   HCT  --   --  26.7*  --   PLT  --   --  91*  --   LABPROT 30.7*  --  31.9* 29.0*  INR 3.0*  --  3.2* 2.8*  CREATININE 3.51* 4.40*  --   --     Estimated Creatinine Clearance: 8.9 mL/min (A) (by C-G formula based on SCr of 4.4 mg/dL (H)).   Medical History: Past Medical History:  Diagnosis Date  . Anemia    takes Folic Acid;pt states she gets an injection every 2wks   . Chronic combined systolic and diastolic congestive heart failure ([place])   . Closed right hip fracture, initial encounter ([place]) 10/19/2017  . Depression    but doesn't take any meds for it  . Diabetes mellitus    takes Lantus and Novolog 70/30;fasting blood sugar 200;Type 2  . GERD (gastroesophageal reflux disease)    takes Omeprazole daily  . GI bleeding   . History of bladder infections    last one a month ago;saw Dr.Wrenn and cysto was done in office;was on antibiotics and completed 2wks ago  . History of blood transfusion    no blood transfusion  . HLD (hyperlipidemia) 10/19/2017  . HTN (hypertension) 10/19/2017  . Hyperlipidemia    takes Zocor daily  . Hypertension    takes Coreg daily  . Mitral regurgitation   . Myocardial infarction ([place]) 2010  . PAF (paroxysmal atrial fibrillation) ([place])   . Pancreatitis   . Peripheral neuropathy   . Pleural effusion, bilateral   . 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 UTI (lower urinary tract infection)      Assessment: 73 year old female s/p VATS 4/22. Patient with history of afib on warfarin prior to admission. Pharmacy has been consulted for warfarin dosing.   PTA warfarin regimen is 2.5 mg daily except 1.25 mg on Saturday. Admit INR was 4.  Of note, ABLA noted, and pt was given Vitamin K IV 10 mg on 4/19 and 4/20, with an additional 5 mg IV x1 4/20. Additionally, pt has been on amiodarone since 4/15, which can increase warfarin concentrations and therefore INR. INR has been held x2 doses after rapid rise, now down to 2.8.   Goal of Therapy:  INR 2-3 Monitor platelets by anticoagulation protocol: Yes   Plan:  -Warfarin 2.5mg  x1 tonight -Daily INR   Arrie Senate, PharmD, BCPS Clinical Pharmacist [phone] Please check AMION for all [facility] Pharmacy numbers 06/26/2018

## 2018-06-26 NOTE — Progress Notes (Signed)
Patient ID: Vanessa Webb, female   DOB: 09-Feb-1946, 73 y.o.   MRN: [id number] [facility] Progress Note   Assessment/ Plan:   1.  Atrial fibrillation with rapid ventricular response:  per cardiology; metoprolol and amio.  For possible future elective cardioversion per cardiology.  Note that hypotension has limited UF with HD in the past and HR running 100-130's with HD.  2.  End-stage renal disease: HD is per TTS schedule - Normally at [facility] - HD 4/30: 3K, UF 1-2L as able SBP > 90.  Midodrine 10mg  pre HD and prn at half way point, no heparin; albumin could be used as well  3. Pleural effusion with pseudomonal empyema - s/p VATS.  chest tube removed 4/27. TCTX and [facility] have consulted.  ON cefepime. Plan for 2gm post HD and 3gm on 2d intradilaytic break. End date 5/19.   4.  Hyponatremia: improved with HD/UF   5. 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  aranesp given 4/24 q Sat; low dose and driting down, inc ot 159mcg Aranesp weekly.  High ferritin, no IV Fe at this time  5.  Secondary hyperparathyroidism: follow calcium/phosphorus levels. Stable without binder  6.  Protein calorie malnutrition: Significant hypoalbuminemia noted most possibly arising from nonhealing leg ulcer/acute phase reactant.  Continue renal vitamin/ONS.  7.  Hallucinations: off of Lomotil and metronidazole   Subjective:    SNF search in process  No other issues    Objective:   BP (!) 133/55 (BP Location: Left Arm)   Pulse 80   Temp (!) 97.1 F (36.2 C) (Axillary)   Resp 14   Ht 5\' 4"  (1.626 m)   Wt 50.4 kg   SpO2 100%   BMI 19.07 kg/m   Physical Exam: Gen: NAD  CVS: irregularly irregular; no rub; normal rate Resp: right lung reduced breath sounds; left lung clear; unlabored Abd: Soft, flat, nontender Ext: no lower extremity edema; right arm less swollen Access: right AVF with bruit and thrill   Labs: BMET Recent Labs  Lab 06/20/18 0500 06/21/18 0812 06/22/18 0531  06/24/18 0236 06/25/18 0218  NA 135 134* 136 135 133*  K 3.8 3.9 4.3 4.5 4.0  CL 95* 99 97* 98 97*  CO2 27 25 28 26 28   GLUCOSE 108* 124* 150* 74 48*  BUN 15 23 9 20  27*  CREATININE 2.54* 3.37* 2.10* 3.51* 4.40*  CALCIUM 7.9* 7.5* 7.9* 7.9* 7.6*  PHOS 2.9 3.6 2.3* 3.0 3.5   CBC Recent Labs  Lab 06/20/18 0500 06/21/18 0812 06/23/18 0247 06/25/18 0305  WBC 10.5 7.4 6.7 8.4  HGB 8.9* 8.5* 8.8* 8.0*  HCT 29.6* 28.0* 29.4* 26.7*  MCV 87.3 87.0 88.0 87.0  PLT 131* 114* 94* 91*   Medications:    . amiodarone  200 mg Oral BID  . atorvastatin  20 mg Oral QHS  . Chlorhexidine Gluconate Cloth  6 each Topical Q0600  . collagenase   Topical Daily  . [START ON 06/28/2018] darbepoetin (ARANESP) injection - DIALYSIS  100 mcg Intravenous Q Sat-HD  . feeding supplement  1 Container Oral TID BM  . feeding supplement (PRO-STAT SUGAR FREE 64)  30 mL Oral BID  . Gerhardt's butt cream   Topical QID  . insulin aspart  0-9 Units Subcutaneous TID WC  . metoprolol tartrate  25 mg Oral BID  . midodrine  10 mg Oral TID WC  . multivitamin  1 tablet Oral QHS  . nystatin  5 mL Oral QID  . warfarin  2.5 mg Oral ONCE-1800  . Warfarin - Pharmacist Dosing Inpatient   Does not apply q1800    Rexene Agent 06/26/2018

## 2018-06-26 NOTE — NC FL2 (Signed)
[place] MEDICAID FL2 LEVEL OF CARE SCREENING TOOL     IDENTIFICATION  Patient Name: Vanessa Webb Birthdate: [birth year] Sex: female Admission Date (Current Location): 06/11/2018  [facility] and [place] Number:  Herbalist and Address:  [facility], [place] [address] [place], [place] [address]      Provider Number: [id number]  Attending Physician Name and Address:  Florencia Reasons, MD  Relative Name and Phone Number:  Jeanelle, Dake    [phone]    Current Level of Care: Hospital Recommended Level of Care: [place] Prior Approval Number:    Date Approved/Denied:   PASRR Number: [id number]  Discharge Plan: SNF    Current Diagnoses: Patient Active Problem List   Diagnosis Date Noted  . FTT (failure to thrive) in adult   . Persistent atrial fibrillation   . 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 Diarrhea 06/17/2018  . Empyema of pleural space ([place]) 06/15/2018  . Left humeral fracture 06/15/2018  . Chronic combined systolic and diastolic congestive heart failure ([place])   . Pleural effusion, bilateral   . Mitral regurgitation   . Pressure injury of skin 06/11/2018  . Closed right hip fracture, initial encounter ([place]) 10/19/2017  . HTN (hypertension) 10/19/2017  . S/P right hip fracture 10/19/2017  . Hyperlipidemia 10/18/2016  . Paroxysmal SVT (supraventricular tachycardia) ([place]) 10/18/2016  . PAF (paroxysmal atrial fibrillation) ([place])   . Atherosclerosis of native arteries of the extremities with ulceration ([place]) 03/18/2015  . ESRD (end stage renal disease) on dialysis ([place]) 08/26/2012  . 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 10/21/2011    Orientation RESPIRATION BLADDER Height & Weight     Self, Time, Situation, Place  O2(nasal cannual 2(L/min)) Continent Weight: 111 lb 1.8 oz (50.4 kg) Height:  5\' 4"  (162.6 cm)  BEHAVIORAL SYMPTOMS/MOOD NEUROLOGICAL BOWEL NUTRITION STATUS      Continent Diet(please  see discharge summary)  AMBULATORY STATUS COMMUNICATION OF NEEDS Skin     Verbally Surgical wounds, Skin abrasions(Abrasion, elbow arm right, ecchymosis, shoulder, left  non pressure wound leg, right lateral, non pressure wound leg, right medial)                       Personal Care Assistance Level of Assistance  Bathing, Feeding, Dressing Bathing Assistance: Limited assistance Feeding assistance: Independent Dressing Assistance: Limited assistance     Functional Limitations Info  Sight, Hearing, Speech Sight Info: Adequate Hearing Info: Adequate Speech Info: Adequate    SPECIAL CARE FACTORS FREQUENCY  PT (By licensed PT), OT (By licensed OT)     PT Frequency: 5x per wk OT Frequency: 5x per wk            Contractures Contractures Info: Not present    Additional Factors Info  Code Status, Allergies(Dialysisi Patient T, Th, Sat at [facility]) Code Status Info: DNR Allergies Info: Sulfa Antibiotics           Current Medications (06/26/2018):  This is the current hospital active medication list Current Facility-Administered Medications  Medication Dose Route Frequency Provider Last Rate Last Dose  . 0.9 %  sodium chloride infusion   Intravenous PRN Florencia Reasons, MD   Stopped at 06/25/18 1431  . acetaminophen (TYLENOL) tablet 650 mg  650 mg Oral Q6H PRN Nani Skillern, PA-C   650 mg at 06/18/18 2026   Or  . acetaminophen (TYLENOL) suppository 650 mg  650 mg Rectal Q6H PRN Nani Skillern, PA-C      .  acetaminophen (TYLENOL) tablet 500 mg  500 mg Oral Q6H PRN Lars Pinks M, PA-C      . amiodarone (PACERONE) tablet 200 mg  200 mg Oral BID Lars Pinks M, PA-C   200 mg at 06/25/18 2149  . antiseptic oral rinse (BIOTENE) solution 15 mL  15 mL Mouth Rinse PRN Lars Pinks M, PA-C      . atorvastatin (LIPITOR) tablet 20 mg  20 mg Oral QHS Lars Pinks M, PA-C   20 mg at 06/25/18 2149  . bisacodyl (DULCOLAX) EC tablet 10 mg  10 mg Oral  Daily PRN Kc, Ramesh, MD      . ceFEPIme (MAXIPIME) 2 g in sodium chloride 0.9 % 100 mL IVPB  2 g Intravenous Once per day on Tue Thu Sinclair, Emily S, [facility]      . [START ON 06/28/2018] ceFEPIme (MAXIPIME) 3 g in dextrose 5 % 50 mL IVPB  3 g Intravenous Q Sat-1800 Powers, Evern Core, MD      . Chlorhexidine Gluconate Cloth 2 % PADS 6 each  6 each Topical Q0600 Claudia Desanctis, MD   6 each at 06/24/18 [phone]  . collagenase (SANTYL) ointment   Topical Daily Lars Pinks M, PA-C      . [START ON 06/28/2018] Darbepoetin Alfa (ARANESP) injection 100 mcg  100 mcg Intravenous Q Sat-HD Pearson Grippe B, MD      . feeding supplement (BOOST / RESOURCE BREEZE) liquid 1 Container  1 Container Oral TID BM Antonieta Pert, MD   1 Container at 06/25/18 1318  . feeding supplement (PRO-STAT SUGAR FREE 64) liquid 30 mL  30 mL Oral BID Kc, Ramesh, MD   30 mL at 06/25/18 2149  . Gerhardt's butt cream   Topical QID Nani Skillern, PA-C   1 application at 02/58/52 2145  . insulin aspart (novoLOG) injection 0-9 Units  0-9 Units Subcutaneous TID WC Florencia Reasons, MD   1 Units at 06/26/18 0645  . loperamide (IMODIUM) capsule 2 mg  2 mg Oral BID PRN Nani Skillern, PA-C   2 mg at 06/25/18 1323  . metoprolol tartrate (LOPRESSOR) tablet 25 mg  25 mg Oral BID Lars Pinks M, PA-C   25 mg at 06/25/18 2150  . midodrine (PROAMATINE) tablet 10 mg  10 mg Oral TID WC Rexene Agent, MD   10 mg at 06/26/18 0645  . morphine 2 MG/ML injection 1-2 mg  1-2 mg Intravenous Q2H PRN Lars Pinks M, PA-C      . multivitamin (RENA-VIT) tablet 1 tablet  1 tablet Oral QHS Nani Skillern, PA-C   1 tablet at 06/25/18 2149  . ondansetron (ZOFRAN) injection 4 mg  4 mg Intravenous Q6H PRN Lars Pinks M, PA-C   4 mg at 06/23/18 1332  . senna-docusate (Senokot-S) tablet 1 tablet  1 tablet Oral QHS PRN Kc, Ramesh, MD      . traMADol (ULTRAM) tablet 50 mg  50 mg Oral Q12H PRN Nani Skillern, PA-C      . Warfarin -  Pharmacist Dosing Inpatient   Does not apply q1800 Lyndee Leo [place] at 06/25/18 1800     Discharge Medications: Please see discharge summary for a list of discharge medications.  Relevant Imaging Results:  Relevant Lab Results:   Additional Information SSN# [id number]  Dialysis Patient   Vinie Sill, [place]

## 2018-06-26 NOTE — Care Management Important Message (Signed)
Important Message  Patient Details  Name: Vanessa Webb MRN: [id number] Date of Birth: 25-Jun-1945   Medicare Important Message Given:  Yes    Sofiya Ezelle 06/26/2018, 1:39 PM

## 2018-06-27 ENCOUNTER — Telehealth: Payer: Self-pay | Admitting: Physician Assistant

## 2018-06-27 ENCOUNTER — Other Ambulatory Visit: Payer: Self-pay | Admitting: Pharmacist

## 2018-06-27 DIAGNOSIS — Z7901 Long term (current) use of anticoagulants: Secondary | ICD-10-CM

## 2018-06-27 LAB — CBC WITH DIFFERENTIAL/PLATELET
Abs Immature Granulocytes: 0.04 10*3/uL (ref 0.00–0.07)
Basophils Absolute: 0 10*3/uL (ref 0.0–0.1)
Basophils Relative: 0 %
Eosinophils Absolute: 0.1 10*3/uL (ref 0.0–0.5)
Eosinophils Relative: 1 %
HCT: 28.9 % — ABNORMAL LOW (ref 36.0–46.0)
Hemoglobin: 8.7 g/dL — ABNORMAL LOW (ref 12.0–15.0)
Immature Granulocytes: 0 %
Lymphocytes Relative: 6 %
Lymphs Abs: 0.6 10*3/uL — ABNORMAL LOW (ref 0.7–4.0)
MCH: 26.4 pg (ref 26.0–34.0)
MCHC: 30.1 g/dL (ref 30.0–36.0)
MCV: 87.6 fL (ref 80.0–100.0)
Monocytes Absolute: 0.8 10*3/uL (ref 0.1–1.0)
Monocytes Relative: 8 %
Neutro Abs: 8.2 10*3/uL — ABNORMAL HIGH (ref 1.7–7.7)
Neutrophils Relative %: 85 %
Platelets: 97 10*3/uL — ABNORMAL LOW (ref 150–400)
RBC: 3.3 MIL/uL — ABNORMAL LOW (ref 3.87–5.11)
RDW: 17.4 % — ABNORMAL HIGH (ref 11.5–15.5)
WBC: 9.7 10*3/uL (ref 4.0–10.5)
nRBC: 0 % (ref 0.0–0.2)

## 2018-06-27 LAB — HEPATIC FUNCTION PANEL
ALT: 8 U/L (ref 0–44)
AST: 12 U/L — ABNORMAL LOW (ref 15–41)
Albumin: 1.8 g/dL — ABNORMAL LOW (ref 3.5–5.0)
Alkaline Phosphatase: 71 U/L (ref 38–126)
Bilirubin, Direct: 0.1 mg/dL (ref 0.0–0.2)
Indirect Bilirubin: 0.5 mg/dL (ref 0.3–0.9)
Total Bilirubin: 0.6 mg/dL (ref 0.3–1.2)
Total Protein: 5.4 g/dL — ABNORMAL LOW (ref 6.5–8.1)

## 2018-06-27 LAB — PROTIME-INR
INR: 2.9 — ABNORMAL HIGH (ref 0.8–1.2)
Prothrombin Time: 29.9 seconds — ABNORMAL HIGH (ref 11.4–15.2)

## 2018-06-27 LAB — GLUCOSE, CAPILLARY
Glucose-Capillary: 111 mg/dL — ABNORMAL HIGH (ref 70–99)
Glucose-Capillary: 150 mg/dL — ABNORMAL HIGH (ref 70–99)

## 2018-06-27 LAB — FOLATE: Folate: 90.6 ng/mL (ref >5.9)

## 2018-06-27 LAB — VITAMIN B12: Vitamin B-12: 833 pg/mL (ref 180–914)

## 2018-06-27 MED ORDER — WARFARIN SODIUM 2 MG PO TABS: 2.0000 mg | ORAL_TABLET | Freq: Once | ORAL | Status: DC

## 2018-06-27 MED ORDER — SENNOSIDES-DOCUSATE SODIUM 8.6-50 MG PO TABS: 1.0000 | ORAL_TABLET | Freq: Every evening | ORAL | Status: AC | PRN

## 2018-06-27 MED ORDER — WARFARIN SODIUM 2.5 MG PO TABS: ORAL_TABLET | ORAL | 0 refills | Status: AC

## 2018-06-27 NOTE — Progress Notes (Signed)
Occupational Therapy Treatment Patient Details Name: Vanessa Webb MRN: [id number] DOB: 1946-02-10 Today's Date: 06/27/2018    History of present illness  Vanessa Webb is a 73 y.o. female with medical history significant of ESRD on TTS schedule, hypertension, hyperlipidemia, type 2 diabetes mellitus, on chronic anticoagulation with Coumadin presents to the hospital with chief complaint of generalized weakness.  Found to be in Afib with RVR.  CT showed large right sided pleural effusion.  pt s/p right thoracocentensis on 4/17.  Chest tube placed by IR 4/19.  Pt s/p VATS for empyema with pseudomonas 4/22.   OT comments  Pt making steady progress today. Able to ambulate in room with moderate assistance, RW and second person for safety. Pt stood at sink for one grooming activity x 1 minute and sat at sink to comb her hair. Pt remained up in chair at end of session. NT aware that pt's sacrum is sore and she will need assistance within 1-1.5 hours to return to bed. Continues to be appropriate for SNF level rehab.   Follow Up Recommendations  SNF;Supervision/Assistance - 24 hour    Equipment Recommendations  Other (comment)(defer to next venue)    Recommendations for Other Services      Precautions / Restrictions Precautions Precautions: Fall       Mobility Bed Mobility Overal bed mobility: Needs Assistance Bed Mobility: Supine to Sit     Supine to sit: Mod assist     General bed mobility comments: assist to raise trunk  Transfers Overall transfer level: Needs assistance Equipment used: Rolling walker (2 wheeled) Transfers: Sit to/from Stand Sit to Stand: Mod assist;+2 safety/equipment         General transfer comment: assist to rise and steady, cues for hand placement    Balance Overall balance assessment: Needs assistance   Sitting balance-Leahy Scale: Fair     Standing balance support: During functional activity Standing balance-Leahy Scale: Poor Standing balance  comment: min assist and one hand on sink during toothbrushing                           ADL either performed or assessed with clinical judgement   ADL Overall ADL's : Needs assistance/impaired     Grooming: Brushing hair;Sitting;Minimal assistance;Oral care;Standing Grooming Details (indicate cue type and reason): pt able to stand at sink x 1 minute to brush her teeth, sat to comb hair                             Functional mobility during ADLs: Moderate assistance;+2 for safety/equipment;Rolling walker       Vision       Perception     Praxis      Cognition Arousal/Alertness: Awake/alert Behavior During Therapy: Flat affect Overall Cognitive Status: No family/caregiver present to determine baseline cognitive functioning Area of Impairment: Problem solving                             Problem Solving: Slow processing          Exercises     Shoulder Instructions       General Comments      Pertinent Vitals/ Pain       Pain Assessment: Faces Faces Pain Scale: Hurts little more Pain Location: sacrum Pain Descriptors / Indicators: Discomfort;Sore Pain Intervention(s): Monitored during session  Home Living  Prior Functioning/Environment              Frequency  Min 2X/week        Progress Toward Goals  OT Goals(current goals can now be found in the care plan section)  Progress towards OT goals: Progressing toward goals  Acute Rehab OT Goals Patient Stated Goal: Wants to get home as soon as she can. OT Goal Formulation: With patient Time For Goal Achievement: 07/07/18 Potential to Achieve Goals: Good  Plan Discharge plan remains appropriate    Co-evaluation    PT/OT/SLP Co-Evaluation/Treatment: Yes Reason for Co-Treatment: For patient/therapist safety   OT goals addressed during session: ADL's and self-care      AM-PAC OT "6 Clicks" Daily  Activity     Outcome Measure   Help from another person eating meals?: None Help from another person taking care of personal grooming?: A Lot Help from another person toileting, which includes using toliet, bedpan, or urinal?: A Lot Help from another person bathing (including washing, rinsing, drying)?: A Lot Help from another person to put on and taking off regular upper body clothing?: A Little Help from another person to put on and taking off regular lower body clothing?: A Lot 6 Click Score: 15    End of Session Equipment Utilized During Treatment: Rolling walker;Gait belt  OT Visit Diagnosis: Unsteadiness on feet (R26.81);Other abnormalities of gait and mobility (R26.89);Muscle weakness (generalized) (M62.81);Adult, failure to thrive (R62.7);Other symptoms and signs involving cognitive function   Activity Tolerance Patient tolerated treatment well(VSS on RA)   Patient Left in chair;with call bell/phone within reach;with chair alarm set   Nurse Communication Other (comment)(NT aware that pt should only sit up as comfortable)        Time: 6301-6010 OT Time Calculation (min): 37 min  Charges: OT General Charges $OT Visit: 1 Visit OT Treatments $Self Care/Home Management : 8-22 mins  Nestor Lewandowsky, OTR/L Acute Rehabilitation Services Pager: [phone] Office: [phone]   Malka So 06/27/2018, 10:51 AM

## 2018-06-27 NOTE — Discharge Summary (Signed)
Discharge Summary  Vanessa Webb ZOX:[id number] DOB: 18-Mar-1945  PCP: Vanessa Blitz, MD  Admit date: 06/11/2018 Discharge date: 06/27/2018  Time spent: 41mns, more than 50% time spent on coordination of care.  Recommendations for Outpatient Follow-up:  1. F/u with SNF PCP for hospital discharge follow up, repeat cbc/bmp at follow up. Monitor INR 2. F/u with cardiology on 5/4 (tele visit) 3. F/u with infectious disease on 5/14 4. F/u with thoracic surgery on 5/18 5. Continue dialysis TTS, abx during dialysis , last dose on 5/19. Midodrine to be given before and during dialysis treatment. 6. 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    Discharge Condition: stable  Diet recommendation: heart healthy/renal diet  Filed Weights   06/26/18 1310 06/26/18 1708 06/27/18 0137  Weight: 52.1 kg 50.8 kg 48.8 kg    History of present illness: (per admitting MD Dr GAnnye Asa PCP: SMonico Blitz MD  Patient coming from: [facility]  Chief Complaint: Generalized weakness  HPI: Vanessa LOOPERis a 73y.o. female with medical history significant of ESRD on TTS schedule, hypertension,  hyperlipidemia, type 2 diabetes mellitus, on chronic anticoagulation with Coumadin presents to the hospital with chief complaint of generalized weakness.  [facility]. fib with RVR and a chest x-ray over there showed right-sided pleural effusion, she was placed on diltiazem infusion initially however it appears that she was transitioned to amiodarone since her rates were poorly controlled.  Given lack of nephrology at their hospital she was transferred here.  Patient complains of generalized weakness that is been going on for the past couple weeks, barely able to get up and move around due to lack of energy.  She missed her dialysis yesterday on 4/14 due to weakness.  She denies any chest pain, denies any shortness of breath.  She denies any palpitations.  She has not had any fever or chills, no cough or chest congestion.  No sick contacts.  She also has been having right lower extremity wounds that have been going on for the past month which are gradually improving with local wound care.  She denies any abdominal pain, nausea or vomiting.  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  -Patient is ESRD with HD on TTS.  Planned 4 week course of abx with cefepime from VATS with stop date 5/19 Plan: Cefepime 2 gm Q TTh and 3 gm on Sat with HD -appreciate pharmacy and ID input -Clinically stable ,afebrilebutgiven trapped lung patient is at risk for pneumonia and empyema (poor prognosis) -she is to follow up with infectious disease and thoracic surgery  Pulmonary nodule/mild right paratracheal lymphadenopathy8 mm LLL pulmonary nodule seen on CT chest 8 follow-up in 3 to 6 months with CT chest. Pleural fluid is negative for malignancy.   Thrombocytopenia: Seems to be chronic and fluctuating for years, the lowest in the record was in the 80's from 2018 She is not on heparin,  B12/folate unremarkable plt 97 at discharge, monitor  Persistent atrial fibrillation : Rate has been difficult to controland initial plan wasCardioversion after 3 weeks of therapeutic INR.She hashad challengesespecially with hypotension and RVR IN 10-130Sduring dialysis limiting ultrafiltration. She is started on amiodarone, continue metoprolol.  Appreciate cardiology /pharmacy input.  Coumadin dose:  2.37m Mon/Wed/Fri and 1.243mTues/Thurs/Sat/Sun,  Monitor INR closely, goal of INR 2-3 She is to follow up with cardiology for afib management and cardioversion evaluation.    Acute on chronic combined CHFwith EF 40 to 45% down from 55 to 60% in January 2018.Fluid being managed with dialysis   ESRD On HD TTSw anemia of chronic disease/mineral bone disease: follow up with nephrology. Need midodrine before and during dialysis     Hypotension:BP seems stable now, started on Midodrine 10 mg TID , and prn halfway during dialysis, nephrology following   Hyponatremiaimproved with dialysis.  Diet controlled Type 2 DM with hypoglycemia, a1c 5.9 4/17,  Hyperlipidemia:on lipitor.  Protein calorie malnutrition severe,  with hypoalbuminemia. Is not eating well and only minimal intake- changed to regular diet to increase food option.Again encouraged oral intake.  Hallucination/Intermittent confusion as per the husband:Now stable.She is off of Lomotil and Flagyl.   Pressure injury unstageable, RLE wound followed by wound care outpatient, POA, 12cm x 1.5cm x 0.2cm; 90% black soft eschar-Medial 4cm x 2.0cm x 0.2cm; 50% black soft eschar.present on admission, wound on left leg present on admission.cont wound care.  Stage II Sacral decubitus0.4cm x 0.2cm x 0.1cm; POA.Scabbed without drainage. Cont wound care    Recurrent fal/hx of right hip fractures, comminuted left proximal humerus fracture: status post IM nail of hip 6 months ago, left shoulder managed conservatively, continue pain control, PT OT  FTT:  she is to discharge to SNF    DVT prophylaxis: SCD/ warfarin  Code Status: DNR (signed and placed on paper chart)  Family Communication: patient   Disposition Plan: SNF on 5/1   Consultants:  ID  IR for thoracentesis on 4/17 then chest tube placement on 4/20  Thoracic surgery Dr OwRoxy Mannsor VATS of right empyema  Nephrology for HD TTS  Cardiology for afib/chf/mitral regurgitation  CIR  Wound care  Procedures:  Right sided chest tube placement on 4/20,   VAST right empyema on  4/22, chest tube removed on 4/27  HD  Antibiotics:  As above   Discharge Exam:  BP (!) 104/57 (BP Location: Left Arm)    Pulse 88    Temp (!) 97.4 F (36.3 C) (Oral)    Resp 16    Ht '5\' 4"'  (1.626 m)    Wt 48.8 kg    SpO2 96%    BMI 18.47 kg/m    General:  Frail, NAD, aaox3, no confusion today  Cardiovascular: IRRR  Respiratory: CTABL  Abdomen: Soft/ND/NT, positive BS  Musculoskeletal: No Edema  Neuro: alert, oriented    Discharge Instructions You were cared for by a hospitalist during your hospital stay. If you have any questions about your discharge medications  or the care you received while you were in the hospital after you are discharged, you can call the unit and asked to speak with the hospitalist on call if the hospitalist that took care of you is not available. Once you are discharged, your primary care physician will handle any further medical issues. Please note that NO REFILLS for any discharge medications will be authorized once you are discharged, as it is imperative that you return to your primary care physician (or establish a relationship with a primary care physician if you do not have one) for your aftercare needs so that they can reassess your need for medications and monitor your lab values.  Discharge Instructions    Diet - low sodium heart healthy   Complete by:  As directed    Renal diet   Home infusion instructions [facility] May follow [facility] Dosing Protocol; May administer Cathflo as needed to maintain patency of vascular access device.; Flushing of vascular access device: per [facility] Protocol: 0.9% NaCl pre/post medica...   Complete by:  As directed    Instructions:  May follow [place] Dosing Protocol   Instructions:  May administer Cathflo as needed to maintain patency of vascular access device.   Instructions:  Flushing of vascular access device: per [facility] Protocol: 0.9% NaCl pre/post medication administration and prn patency; Heparin 100 u/ml, 35m for implanted ports and Heparin 10u/ml, 574mfor all other central venous catheters.   Instructions:  May follow [facility] Anaphylaxis Protocol for First Dose Administration in the home: 0.9% NaCl at 25-50 ml/hr to maintain IV access for protocol meds. Epinephrine 0.3 ml IV/IM PRN and Benadryl 25-50 IV/IM PRN s/s of anaphylaxis.   Instructions:  AdDurannfusion Coordinator (RN) to assist per patient IV care needs in the home PRN.   Increase activity slowly   Complete by:  As directed      Allergies as of 06/27/2018      Reactions   Sulfa Antibiotics Nausea Only       Medication List    STOP taking these medications   amLODipine 2.5 MG tablet Commonly known as:  NORVASC   oxyCODONE 5 MG immediate release tablet Commonly known as:  Oxy IR/ROXICODONE     TAKE these medications   acetaminophen 500 MG tablet Commonly known as:  TYLENOL Take 500 mg by mouth every 6 (six) hours as needed (for pain.).   amiodarone 200 MG tablet Commonly known as:  PACERONE Take 1 tablet (200 mg total) by mouth daily.   atorvastatin 20 MG tablet Commonly known as:  LIPITOR Take 20 mg by mouth at bedtime.   ceFEPime  IVPB Commonly known as:  MAXIPIME Administer 2 gm IV with HD on T and Th and 3 gm with HD on Sat  Indication:  Pseudomonas empyema  Last Day of Therapy:  07/15/2018 Labs -  Once weekly:  CBC/D and BMP, Labs - Every other week:  ESR and CRP   collagenase ointment Commonly known as:  SANTYL Apply topically daily.   feeding supplement (PRO-STAT SUGAR FREE 64) Liqd Take 30 mLs by mouth 2 (two) times daily.   Gerhardt's butt cream Crea Apply 1 application topically 4 (four) times daily.   lidocaine-prilocaine cream Commonly known as:  EMLA Apply 1 application topically daily as needed (prior to port being accessed).   metoprolol tartrate 25 MG tablet Commonly known as:  LOPRESSOR Take 1 tablet (25 mg total) by mouth 2 (two) times daily. What changed:  See the new instructions.   midodrine 10 MG tablet Commonly known as:  PROAMATINE Take 1 tablet (10 mg total) by mouth 3 (three) times daily with meals.   multivitamin Tabs tablet Take 1 tablet by mouth at bedtime.   nystatin 100000 UNIT/ML suspension Commonly known as:  MYCOSTATIN Take 5 mLs (500,000 Units total) by mouth 4 (four) times daily.   senna-docusate 8.6-50 MG tablet Commonly known as:  Senokot-S Take 1 tablet by mouth at bedtime as needed for mild constipation.   warfarin 2.5 MG tablet Commonly known as:  COUMADIN 2.66m Mon/Wed/Fri and 1.229mTues/Thurs/Sat/Sun, monitor  INR, further dose adjustment per INR level Goal of INR 2-3 What changed:  additional instructions            Home Infusion Instuctions  (From admission, onward)         Start     Ordered   06/26/18 0000  Home infusion instructions [facility] May follow [place] Protocol; May administer Cathflo as needed to maintain patency of vascular access device.; Flushing of vascular access device: [facility]: 0.9% NaCl pre/post medica...    Question Answer Comment  Instructions May follow [place] Protocol   Instructions May administer Cathflo as needed to maintain patency of vascular access device.   Instructions Flushing of vascular access device: per [facility]: 0.9% NaCl pre/post medication administration and prn patency; Heparin 100 u/ml, 22m67mor implanted ports and Heparin 10u/ml, 22ml67mr all other central venous catheters.   Instructions May follow [facility] Anaphylaxis Protocol for First Dose Administration in the home: 0.9% NaCl at 25-50 ml/hr to maintain IV access for protocol meds. Epinephrine 0.3 ml IV/IM PRN and Benadryl 25-50 IV/IM PRN s/s of anaphylaxis.   Instructions [facility] Infusion Coordinator (RN) to assist per patient IV care needs in the home PRN.      06/26/18 1241         Allergies  Allergen Reactions   Sulfa Antibiotics Nausea Only   Follow-up Information    LawrLendon Colonel Follow up on 07/06/2018.   Specialties:  Nurse Practitioner, Radiology, Cardiology Why:  You have been scheduled for a virtual visit with KathJory Sims 07/04/2018 at 9:30amm. You will receive a call 2-3 days prior to your appt to discuss additional instructions. Please refer to your discharge paperwork for more information.  Contact information: [id number] [phone]        ShahMonico Webb Follow up.   Specialty:  Internal Medicine Contact information: [address] [phone]        OwenRexene Alberts Follow up.   Specialty:  Cardiothoracic Surgery Why:  Your appointment is on 07/14/2018 at 1:00pm. Please arrive at 12:30pm for [facility].  Contact information: [address] [place] [address] [phone]        continue dialysis TTS Follow up.   Why:  continue antibiotics during dialysis , last date of antibiotics on 5/19. needs midorine to be given before and during dialysis treatment       Powers, Evern Core, MD Follow up.   Specialty:  Infectious Diseases Why:  07/10/18 at 2:30. If you are not able to make appointment, please contact our office  Contact information: [place] [address] [phone]            The results of significant diagnostics from this hospitalization (including imaging, microbiology, ancillary and laboratory) are listed below for reference.    Significant Diagnostic Studies: Dg Chest 1 View  Result Date: 06/13/2018 CLINICAL DATA:  Post thoracentesis right EXAM: CHEST  1 VIEW COMPARISON:  CT 06/13/2018 FINDINGS: Moderately large right pleural effusion. No pneumothorax post thoracentesis. Diffuse airspace disease on the right unchanged Small left effusion and mild left lower lobe airspace disease. Fracture proximal left humerus appears subacute with some callus formation. IMPRESSION: No pneumothorax post right thoracentesis. Electronically Signed   By: Franchot Gallo M.D.   On: 06/13/2018 17:43   Ct Chest Wo Contrast  Result Date: 06/15/2018 CLINICAL DATA:  Bilateral pleural effusions. EXAM: CT CHEST WITHOUT CONTRAST TECHNIQUE: Multidetector CT imaging of the chest was performed following the standard protocol without IV contrast. COMPARISON:  06/13/2018 FINDINGS: Cardiovascular: The heart size is normal. No substantial pericardial effusion. Coronary artery calcification is evident.  Atherosclerotic calcification is noted in the wall of the thoracic aorta. Prominence of the main pulmonary arteries suggests pulmonary arterial hypertension. Mediastinum/Nodes: Upper normal mediastinal lymph nodes are again noted. Assessment right hilum limited by lack of intravenous contrast and adjacent collapse/consolidation. No gross bulky left hilar lymphadenopathy. There is no axillary lymphadenopathy. Gas and residual contrast material in the esophagus may be related to reflux or dysmotility. Lungs/Pleura: The central tracheobronchial airways are patent. Collapse/consolidation of the right middle and lower lobes is similar to prior. 9 mm left upper lobe pulmonary nodule is similar to prior. Dependent left lower lobe atelectasis is stable. Large right pleural effusion is stable to potentially minimally decreased in the interval. The diffuse apparent pleural thickening in the right hemithorax is similar to prior. Small left pleural effusion is stable to minimally progressed. Upper Abdomen: Nodular contour of the liver suggest cirrhosis. Musculoskeletal: Heterogeneous mineralization of bony anatomy is unchanged. Comminuted proximal left humerus fracture again noted. IMPRESSION: 1. No substantial interval change in the large apparently loculated right pleural effusion with associated pleural thickening in the right hemithorax. 2. Similar appearance of the small dependent left lower lobe effusion. 3. Marked collapse/consolidation of the right middle and lower lobes with some dependent atelectasis in the left base. 4. Similar appearance 8-9 mm left upper lobe pulmonary nodule. 5. Prominence of the main pulmonary arteries suggests pulmonary arterial hypertension. 6. Nodular hepatic contour suggests cirrhosis. 7.  Aortic Atherosclerois (ICD10-170.0) Electronically Signed   By: Misty Stanley M.D.   On: 06/15/2018 15:20   Ct Chest Wo Contrast  Result Date: 06/13/2018 CLINICAL DATA:  Invasion. End-stage renal  disease on hemodialysis. Evaluate pleural effusion. EXAM: CT CHEST WITHOUT CONTRAST TECHNIQUE: Multidetector CT imaging of the chest was performed following the standard protocol without IV contrast. COMPARISON:  06/11/2018 chest radiograph. FINDINGS: Cardiovascular: Top-normal heart size. No significant pericardial effusion/thickening. Left main and 3 vessel coronary  atherosclerosis. Atherosclerotic nonaneurysmal thoracic aorta. Dilated main pulmonary artery (3.8 cm diameter). Mediastinum/Nodes: No discrete thyroid nodules. Unremarkable esophagus. No axillary adenopathy. Right paratracheal adenopathy up to 1.5 cm (series 3/image 57). No discrete hilar adenopathy on this noncontrast scan. Lungs/Pleura: No pneumothorax. Large loculated right pleural effusion with diffuse irregular right pleural thickening. Small dependent left pleural effusion. No appreciable calcified pleural plaques. Near complete right lung atelectasis with mild residual aeration predominantly in the right upper lobe. Mild compressive atelectasis in the dependent left lower lobe. No discrete lung masses. Solid left upper lobe 8 mm pulmonary nodule (series 5/image 53). No additional significant pulmonary nodules. No central airway stenoses. Upper abdomen: Liver surface appears slightly irregular, cannot exclude hepatic cirrhosis. Small volume perihepatic ascites. Musculoskeletal: No aggressive appearing focal osseous lesions. Mild thoracic spondylosis. Extensively comminuted left proximal humerus fracture is partially visualized. IMPRESSION: 1. Large loculated right pleural effusion with diffuse irregular right pleural thickening. Right pleural empyema or neoplasm cannot be excluded. Suggest diagnostic right thoracentesis. 2. Near complete right lung atelectasis. 3. Small dependent left pleural effusion with mild dependent left lung base atelectasis. No left pleural thickening. 4. Solid 8 mm left upper lobe pulmonary nodule. If evaluation of the  right pleural effusion reveals neoplastic etiology, suggested attention to this nodule on follow-up chest CT in 3 months. Otherwise, follow-up chest CT in 6 months recommended. 5. Suggestion of slight liver surface irregularity, cannot exclude appendix cirrhosis. Small volume perihepatic ascites. 6. Dilated main pulmonary artery, suggesting pulmonary arterial hypertension. 7. Nonspecific mild right paratracheal lymphadenopathy. If evaluation of the right pleural effusion reveals neoplastic etiology, recommend attention on follow-up chest CT with IV contrast in 3 months. 8. Left main and 3 vessel coronary atherosclerosis. Aortic Atherosclerosis (ICD10-I70.0). Electronically Signed   By: Ilona Sorrel M.D.   On: 06/13/2018 09:38   Dg Chest Port 1 View  Result Date: 06/24/2018 CLINICAL DATA:  Trapped lung. EXAM: PORTABLE CHEST 1 VIEW COMPARISON:  June 23, 2018 FINDINGS: The right-sided chest tube has been removed. No pneumothorax. A small left pleural effusion is stable. Loculated effusion on the right with underlying opacity remains. The effusion and opacity at the right base are more prominent. Probable mild edema/pulmonary venous congestion. No other changes. IMPRESSION: 1. No pneumothorax after right chest tube removal. 2. Persistent loculated right pleural effusion with underlying opacity. The effusion and opacity at the right base is more prominent in the interval. 3. Small left effusion. 4. Mild pulmonary edema. Electronically Signed   By: Dorise Bullion III M.D   On: 06/24/2018 08:29   Dg Chest Port 1 View  Result Date: 06/23/2018 CLINICAL DATA:  Chest tube EXAM: PORTABLE CHEST 1 VIEW COMPARISON:  06/22/2018 FINDINGS: Stable right chest tube and suspected right loculated pleural effusion. No pneumothorax consolidation in the right mid lung zone has increased. Vascular congestion is stable. Small left pleural effusion and hazy opacity at the left base is not significantly changed. IMPRESSION: Stable  right chest tube without pneumothorax. Consolidation in the right mid lung zone has increased. Small left pleural effusion and hazy opacity at the left base is stable. Vascular congestion is stable. Electronically Signed   By: Marybelle Killings M.D.   On: 06/23/2018 08:06   Dg Chest Port 1 View  Result Date: 06/22/2018 CLINICAL DATA:  Empyema EXAM: PORTABLE CHEST 1 VIEW COMPARISON:  06/21/2018, 06/20/2018, 06/19/2018, CT 06/15/2018, shoulder radiograph 03/27/2018 FINDINGS: Right-sided central venous catheter has been removed. A right lower chest tube remains in place. Appearance of decreased loculated  air collection at the right cardio phrenic sulcus. Similar appearance of residual right pleural thickening. Small left-sided pleural effusion. No change dense airspace disease left base and lingula. Stable enlarged cardiomediastinal silhouette. 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 IMPRESSION: 1. Removal of right-sided central venous catheter 2. No significant interval change in residual loculated right-sided pleural thickening. 3. No change in small left effusion and left basilar airspace disease. 4. Cardiomegaly. Electronically Signed   By: Donavan Foil M.D.   On: 06/22/2018 15:17   Dg Chest Port 1 View  Result Date: 06/21/2018 CLINICAL DATA:  Empyema pleural space. EXAM: PORTABLE CHEST 1 VIEW COMPARISON:  June 20, 2018 FINDINGS: The right central line is stable. The right chest tube/drain is stable. The loculated pleural fluid and associated thickening on the right are stable. Mild haziness in the mid right lung is more prominent. A small left effusion is seen. No other changes. IMPRESSION: 1. Support apparatus as above. 2. Stable loculated pleural effusion and associated pleural thickening on the right. 3. Opacity in the right mid to lower lung is a little more prominent and could represent atelectasis versus developing infiltrate. Recommend attention on follow-up. 4. Small left pleural  effusion with underlying atelectasis. Electronically Signed   By: Dorise Bullion III M.D   On: 06/21/2018 06:49   Dg Chest Port 1 View  Result Date: 06/20/2018 CLINICAL DATA:  73 year old female with loculated right pleural effusion, right chest empyema, right chest tube. EXAM: PORTABLE CHEST 1 VIEW COMPARISON:  06/19/2018 and earlier. FINDINGS: Portable AP semi upright view at 0615 hours. Stable right chest tube and right IJ central line. Residual right lung loculated fluid or pleural thickening is stable. No pneumothorax. Patchy right perihilar opacity is stable. Stable cardiac size and mediastinal contours. Stable left lung with small pleural effusion suspected. Stable visible IMPRESSION: 1. Stable right chest tube and right IJ central line. 2. No pneumothorax small volume residual loculated right pleural fluid or pleural thickening. 3. Small left pleural effusion suspected. Electronically Signed   By: Genevie Ann M.D.   On: 06/20/2018 08:00   Dg Chest Port 1 View  Result Date: 06/19/2018 CLINICAL DATA:  73 year old female with a right-sided pneumothorax EXAM: PORTABLE CHEST 1 VIEW COMPARISON:  Prior chest x-ray 06/18/2018 FINDINGS: Right-sided thoracostomy tube remains in good position. Right IJ central venous catheter with the tip at the superior cavoatrial junction no residual right-sided pneumothorax. There is persistent pleural thickening versus pleural effusion. Stable borderline cardiomegaly. Mediastinal contours remain unchanged. Atherosclerotic calcifications again noted in the transverse aorta. Small left pleural effusion with associated left basilar atelectasis. Overall, slightly improved aeration of the right lung. Background bronchitic changes are stable. IMPRESSION: 1. Slightly improved aeration and volumes within the right lung. 2. Persistent patchy airspace opacity in the right lower lobe likely reflects underlying pneumonia or residual atelectasis. 3. Well-positioned right-sided thoracostomy  tube without evidence of pneumothorax. 4. Persistent mild right pleural thickening versus residual pleural effusion. 5. Small layering left pleural effusion and associated left basilar atelectasis. 6. The tip of the right IJ central venous catheter overlies the cavoatrial junction in good position. Electronically Signed   By: Jacqulynn Cadet M.D.   On: 06/19/2018 07:47   Dg Chest Port 1 View  Result Date: 06/18/2018 CLINICAL DATA:  Follow-up central line placement EXAM: PORTABLE CHEST 1 VIEW COMPARISON:  06/17/2018 FINDINGS: Right jugular central line is noted at the cavoatrial junction. No pneumothorax is seen. Previously noted pigtail catheter has been removed and a new  large bore chest tube has been placed on the right. Persistent pleural thickening is noted laterally as well as parenchymal density in the base somewhat improved from the prior study. Small left-sided pleural effusion remains. Chronic changes about the proximal left humerus are noted. IMPRESSION: No pneumothorax following central line placement. New large bore chest tube is noted on the right with improved aeration in the right base. Electronically Signed   By: Inez Catalina M.D.   On: 06/18/2018 10:44   Dg Chest Port 1 View  Result Date: 06/17/2018 CLINICAL DATA:  Chest tube in place EXAM: PORTABLE CHEST 1 VIEW COMPARISON:  06/13/2018 FINDINGS: A chest tube now projects over the right lower hemithorax. The right effusion has improved. Overall volume has diminished. There is now some air within the pleural fluid consistent with hydropneumothorax. There is no apical right pneumothorax. Small left effusion and basilar atelectasis. Normal heart size. IMPRESSION: Right chest tube placed with improvement in the right pleural effusion. Electronically Signed   By: Marybelle Killings M.D.   On: 06/17/2018 08:11   Dg Chest Port 1 View  Result Date: 06/11/2018 CLINICAL DATA:  Pleural effusion. Hypertension. Diabetes. Prior myocardial infarction. EXAM:  PORTABLE CHEST 1 VIEW COMPARISON:  06/10/2018 and 03/27/2018. FINDINGS: Patient rotated to the right. Midline trachea. Mild cardiomegaly. A moderate right pleural effusion with suggestion of loculation superiorly, similar. Tiny left pleural effusion. No pneumothorax. Similar lower lung predominant right-sided airspace disease. The left lung remains clear. Atherosclerosis in the transverse aorta. Proximal left humerus fracture. IMPRESSION: No significant change since one day prior. Moderate right pleural effusion with suggestion of loculation superiorly. Underlying right-sided airspace disease could represent atelectasis and/or infection. Aortic Atherosclerosis (ICD10-I70.0). Tiny left pleural effusion. Electronically Signed   By: Abigail Miyamoto M.D.   On: 06/11/2018 08:22   Ct Image Guided Fluid Drain By Catheter  Result Date: 06/16/2018 INDICATION: 73 year old with multiple medical problems including a right chest empyema. Request for chest tube placement. EXAM: CT-GUIDED RIGHT CHEST TUBE PLACEMENT MEDICATIONS: No antibiotics were given for this procedure. ANESTHESIA/SEDATION: Fentanyl 50 mcg IV; Versed 1.0 mg IV Moderate Sedation Time:  19 minutes The patient was continuously monitored during the procedure by the interventional radiology nurse under my direct supervision. COMPLICATIONS: None immediate. PROCEDURE: Informed written consent was obtained from the patient after a thorough discussion of the procedural risks, benefits and alternatives. All questions were addressed. Maximal Sterile Barrier Technique was utilized including caps, mask, sterile gowns, sterile gloves, sterile drape, hand hygiene and skin antiseptic. A timeout was performed prior to the initiation of the procedure. Patient was placed supine on the CT scanner. Images through the chest were obtained. The right mid axillary region was marked with CT localization. The right mid axillary region was prepped and draped in sterile fashion. Skin and  soft tissues were anesthetized with 1% lidocaine. Yueh catheter was directed into the right pleural space using CT guidance. Initially, bloody fluid was aspirated. Stiff Amplatz wire was advanced into the pleural collection and the tract was dilated to accommodate a 14 [place] multipurpose drain. Subsequently, the yellow pleural fluid was aspirated. Catheter was sutured to skin and attached to a PleurEvac device. No additional fluid was sent for analysis. FINDINGS: Bilateral pleural effusions, right side greater than left. Again noted is irregular right pleural thickening. Collapse of the right lower lobe. Air-fluid pockets in the right pleural space following chest tube placement and findings are compatible with a complex or loculated effusion. IMPRESSION: CT-guided placement of a chest tube  within the loculated right pleural effusion. Moderate sized left pleural effusion. Electronically Signed   By: Markus Daft M.D.   On: 06/16/2018 12:46   Ir Thoracentesis Asp Pleural Space W/img Guide  Result Date: 06/13/2018 INDICATION: Patient with right pleural effusion. Request is made for diagnostic and therapeutic thoracentesis. EXAM: ULTRASOUND GUIDED DIAGNOSTIC AND THERAPEUTIC RIGHT THORACENTESIS MEDICATIONS: 10 mL 1% lidocaine COMPLICATIONS: None immediate. PROCEDURE: An ultrasound guided thoracentesis was thoroughly discussed with the patient and questions answered. The benefits, risks, alternatives and complications were also discussed. The patient understands and wishes to proceed with the procedure. Written consent was obtained. Ultrasound was performed to localize and mark an adequate pocket of fluid in the right chest. The area was then prepped and draped in the normal sterile fashion. 1% Lidocaine was used for local anesthesia. Under ultrasound guidance a 6 Fr Safe-T-Centesis catheter was introduced. Thoracentesis was performed. The catheter was removed and a dressing applied. FINDINGS: A total of approximately  300 mL of bloody fluid was removed. Samples were sent to the laboratory as requested by the clinical team. IMPRESSION: Successful ultrasound guided diagnostic and therapeutic right thoracentesis yielding 300 mL of pleural fluid. Read by: Brynda Greathouse PA-C Electronically Signed   By: Markus Daft M.D.   On: 06/13/2018 17:12    Microbiology: Recent Results (from the past 240 hour(s))  Surgical pcr screen     Status: None   Collection Time: 06/17/18  9:47 PM  Result Value Ref Range Status   MRSA, PCR NEGATIVE NEGATIVE Final   Staphylococcus aureus NEGATIVE NEGATIVE Final    Comment: (NOTE) The Xpert SA Assay (FDA approved for NASAL specimens in patients 27 years of age and older), is one component of a comprehensive surveillance program. It is not intended to diagnose infection nor to guide or monitor treatment. Performed at [facility], [place] [address], [place], [place] [address]      Labs: Basic Metabolic Panel: Recent Labs  Lab 06/21/18 [phone] 06/21/18 9629 06/22/18 0531 06/24/18 0236 06/25/18 0218 06/26/18 1340  NA  --  134* 136 135 133* 132*  K  --  3.9 4.3 4.5 4.0 3.4*  CL  --  99 97* 98 97* 98  CO2  --  '25 28 26 28 24  ' GLUCOSE  --  124* 150* 74 48* 209*  BUN  --  '23 9 20 ' 27* 19  CREATININE  --  3.37* 2.10* 3.51* 4.40* 3.67*  CALCIUM  --  7.5* 7.9* 7.9* 7.6* 7.7*  MG 2.1  --  1.9  --   --   --   PHOS  --  3.6 2.3* 3.0 3.5 3.3   Liver Function Tests: Recent Labs  Lab 06/22/18 0531 06/24/18 0236 06/25/18 0218 06/26/18 1340 06/27/18 0546  AST  --   --   --   --  12*  ALT  --   --   --   --  8  ALKPHOS  --   --   --   --  71  BILITOT  --   --   --   --  0.6  PROT  --   --   --   --  5.4*  ALBUMIN 2.1* 1.9* 1.8* 1.8* 1.8*   No results for input(s): LIPASE, AMYLASE in the last 168 hours. No results for input(s): AMMONIA in the last 168 hours. CBC: Recent Labs  Lab 06/21/18 0812 06/23/18 0247 06/25/18 0305 06/26/18 1838 06/27/18 0546  WBC 7.4 6.7 8.4  9.8 9.7  NEUTROABS  --   --   --   --  8.2*  HGB 8.5* 8.8* 8.0* 9.6* 8.7*  HCT 28.0* 29.4* 26.7* 31.1* 28.9*  MCV 87.0 88.0 87.0 87.4 87.6  PLT 114* 94* 91* 107* 97*   Cardiac Enzymes: No results for input(s): CKTOTAL, CKMB, CKMBINDEX, TROPONINI in the last 168 hours. BNP: BNP (last 3 results) Recent Labs    06/11/18 1113  BNP 1,119.1*    ProBNP (last 3 results) No results for input(s): PROBNP in the last 8760 hours.  CBG: Recent Labs  Lab 06/26/18 0602 06/26/18 1146 06/26/18 1752 06/26/18 2127 06/27/18 0654  GLUCAP 136* 88 92 156* 111*       Signed:  Florencia Reasons MD, PhD  [facility] 06/27/2018, 10:29 AM

## 2018-06-27 NOTE — Progress Notes (Addendum)
[place] for warfarin  Indication: atrial fibrillation  Allergies  Allergen Reactions  . Sulfa Antibiotics Nausea Only    Patient Measurements: Height: 5\' 4"  (162.6 cm) Weight: 107 lb 9.4 oz (48.8 kg) IBW/kg (Calculated) : 54.7  Vital Signs: Temp: 97.4 F (36.3 C) (05/01 0314) Temp Source: Oral (05/01 0314) BP: 136/61 (05/01 0314) Pulse Rate: 88 (05/01 0314)  Labs: Recent Labs    06/25/18 0218 06/25/18 0305 06/26/18 0235 06/26/18 1340 06/26/18 1838 06/27/18 0546  HGB  --  8.0*  --   --  9.6*  --   HCT  --  26.7*  --   --  31.1*  --   PLT  --  91*  --   --  107*  --   LABPROT  --  31.9* 29.0*  --   --  29.9*  INR  --  3.2* 2.8*  --   --  2.9*  CREATININE 4.40*  --   --  3.67*  --   --     Estimated Creatinine Clearance: 10.4 mL/min (A) (by C-G formula based on SCr of 3.67 mg/dL (H)).   Medical History: Past Medical History:  Diagnosis Date  . Anemia    takes Folic Acid;pt states she gets an injection every 2wks   . Chronic combined systolic and diastolic congestive heart failure ([place])   . Closed right hip fracture, initial encounter ([place]) 10/19/2017  . Depression    but doesn't take any meds for it  . Diabetes mellitus    takes Lantus and Novolog 70/30;fasting blood sugar 200;Type 2  . GERD (gastroesophageal reflux disease)    takes Omeprazole daily  . GI bleeding   . History of bladder infections    last one a month ago;saw Dr.Wrenn and cysto was done in office;was on antibiotics and completed 2wks ago  . History of blood transfusion    no blood transfusion  . HLD (hyperlipidemia) 10/19/2017  . HTN (hypertension) 10/19/2017  . Hyperlipidemia    takes Zocor daily  . Hypertension    takes Coreg daily  . Mitral regurgitation   . Myocardial infarction ([place]) 2010  . PAF (paroxysmal atrial fibrillation) ([place])   . Pancreatitis   . Peripheral neuropathy   . Pleural effusion, bilateral   . 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 UTI (lower urinary tract infection)     Assessment: 73 year old female s/p VATS 4/22. Patient with history of afib on warfarin prior to admission. Pharmacy has been consulted for warfarin dosing.   PTA warfarin regimen is 2.5 mg daily except 1.25 mg on Saturday. Admit INR was 4.  Of note, ABLA noted, and pt was given Vitamin K IV 10 mg on 4/19 and 4/20, with an additional 5 mg IV x1 4/20. Additionally, pt has been on amiodarone since 4/15, which can increase warfarin concentrations and therefore INR. INR was been held x2 doses after rapid rise, now down to 2.9 after redosing last night. Noted plans for potential discharge today - given new amiodarone would recommend dose reduction from 2.5mg  daily except 1.25mg  Sunday previously to a new regimen of 2.5mg  Mon/Wed/Fri and 1.25mg  Tues/Thurs/Sat/Sun,   Goal of Therapy:  INR 2-3 Monitor platelets by anticoagulation protocol: Yes   Plan:  -Warfarin 2mg  x1 tonight if still admitted -Daily INR   Arrie Senate, PharmD, BCPS Clinical Pharmacist [phone] Please check AMION for all [place] numbers 06/27/2018

## 2018-06-27 NOTE — Progress Notes (Signed)
Patient ID: Vanessa Webb, female   DOB: Jan 15, 1946, 73 y.o.   MRN: [id number] [facility] Progress Note   Assessment/ Plan:   1.  Atrial fibrillation with rapid ventricular response:  per cardiology; metoprolol and amio.  For possible future elective cardioversion per cardiology.  Note that hypotension has limited UF with HD in the past and HR running 100-130's with HD.  2.  End-stage renal disease: HD is per TTS schedule - Normally at [facility] AVF - HD 5/2: 3K, UF 1-2L as able.  Midodrine 10mg  pre HD and prn at half way point, no heparin; albumin could be used as well  3. Pleural effusion with pseudomonal empyema - s/p VATS.  chest tube removed 4/27. TCTX and [facility] have consulted.  ON cefepime. Plan for 2gm post HD and 3gm on 2d intradilaytic break. End date 5/19.   4.  Hyponatremia: improved with HD/UF   5. 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  aranesp given 4/24 q Sat; low dose and driting down, inc ot 183mcg Aranesp weekly.  High ferritin, no IV Fe at this time  5.  Secondary hyperparathyroidism: follow calcium/phosphorus levels. Stable without binder  6.  Protein calorie malnutrition: Significant hypoalbuminemia noted most possibly arising from nonhealing leg ulcer/acute phase reactant.  Continue renal vitamin/ONS.  7.  Hallucinations: off of Lomotil and metronidazole   Subjective:    HD yesterday: 1.3L UF, post weight 50.8kg  No other c/o    Objective:   BP (!) 104/57 (BP Location: Left Arm)   Pulse 88   Temp (!) 97.4 F (36.3 C) (Oral)   Resp 16   Ht 5\' 4"  (1.626 m)   Wt 48.8 kg   SpO2 100%   BMI 18.47 kg/m   Physical Exam: Gen: NAD  CVS: irregularly irregular; no rub; normal rate Resp: right lung reduced breath sounds; left lung clear; unlabored Abd: Soft, flat, nontender Ext: no lower extremity edema; right arm less swollen Access: right AVF with bruit and thrill   Labs: BMET Recent Labs  Lab 06/21/18 0812 06/22/18 0531 06/24/18 0236  06/25/18 0218 06/26/18 1340  NA 134* 136 135 133* 132*  K 3.9 4.3 4.5 4.0 3.4*  CL 99 97* 98 97* 98  CO2 25 28 26 28 24   GLUCOSE 124* 150* 74 48* 209*  BUN 23 9 20  27* 19  CREATININE 3.37* 2.10* 3.51* 4.40* 3.67*  CALCIUM 7.5* 7.9* 7.9* 7.6* 7.7*  PHOS 3.6 2.3* 3.0 3.5 3.3   CBC Recent Labs  Lab 06/23/18 0247 06/25/18 0305 06/26/18 1838 06/27/18 0546  WBC 6.7 8.4 9.8 9.7  NEUTROABS  --   --   --  8.2*  HGB 8.8* 8.0* 9.6* 8.7*  HCT 29.4* 26.7* 31.1* 28.9*  MCV 88.0 87.0 87.4 87.6  PLT 94* 91* 107* 97*   Medications:    . amiodarone  200 mg Oral BID  . atorvastatin  20 mg Oral QHS  . Chlorhexidine Gluconate Cloth  6 each Topical Q0600  . collagenase   Topical Daily  . [START ON 06/28/2018] darbepoetin (ARANESP) injection - DIALYSIS  100 mcg Intravenous Q Sat-HD  . feeding supplement  1 Container Oral TID BM  . feeding supplement (PRO-STAT SUGAR FREE 64)  30 mL Oral BID  . Gerhardt's butt cream   Topical QID  . insulin aspart  0-9 Units Subcutaneous TID WC  . metoprolol tartrate  25 mg Oral BID  . midodrine  10 mg Oral TID WC  .  multivitamin  1 tablet Oral QHS  . nystatin  5 mL Oral QID  . warfarin  2 mg Oral ONCE-1800  . Warfarin - Pharmacist Dosing Inpatient   Does not apply [place] 06/27/2018

## 2018-06-27 NOTE — Progress Notes (Signed)
Renal Navigator notes patient discharge prior to hearing back from staff at OP HD clinic/[facility] in [place]. Renal Navigator spoke with CSW who states she spoke with SNF staff who report speaking with OP HD clinic and have confirmed that they are prepared to take patient back on current medication regimen.  Renal Navigator contacted OP HD staff who verified this information, however, state that their MD has decided they will treat patient with a 2-2-2 protocol of Cefepime instead of 2-2-3 protocol as written by ID/Dr. Prince Rome (see note from 4/30). Renal Navigator informed Dr. Barbaraann Boys.  Alphonzo Cruise Renal Navigator [phone]

## 2018-06-27 NOTE — TOC Transition Note (Signed)
Transition of Care [facility]) - CM/SW Discharge Note   Patient Details  Name: Vanessa Webb MRN: [id number] Date of Birth: April 18, 1945  [facility]) CM/SW Contact:  Vinie Sill, [place] Phone Number:[phone] 06/27/2018, 11:15 AM   Clinical Narrative:    Patient will DC to: [facility]  DC Date: 06/27/2018 Family Notified: Vanessa Webb; Spouse  Transport By: Vanessa Harold   RN, patient, and facility notified of DC. Discharge Summary sent to facility. RN given number for [phone], [place] Alert;  Main number if needed [phone]. DC packet on chart. Ambulance transport requested for patient.   Clinical Social Worker signing off. Thurmond Butts, MSW, [facility] Clinical Social Worker [phone]     Final next level of care: Skilled Nursing Facility Barriers to Discharge: No Barriers Identified   Patient Goals and CMS Choice Patient states their goals for this hospitalization and ongoing recovery are:: [facility] at [facility] CMS Medicare.gov Compare Post Acute Care list provided to:: Patient Choice offered to / list presented to : Patient, Spouse  Discharge Placement   Existing PASRR number confirmed : 06/26/18          Patient chooses bed at: [facility]) Patient to be transferred to facility by: [place] Name of family member notified: Vanessa Webb, spouse  Patient and family notified of of transfer: 06/27/18  Discharge Plan and Services In-house Referral: NA Discharge Planning Services: CM Consult Post Acute Care Choice: Home Health          DME Arranged: N/A DME Agency: NA       HH Arranged: RN, Disease Management, PT [place] Agency: [place] ([facility])        Social Determinants of Health (SDOH) Interventions     Readmission Risk Interventions No flowsheet data found.

## 2018-06-27 NOTE — Progress Notes (Signed)
Physical Therapy Treatment Patient Details Name: Vanessa Webb MRN: [id number] DOB: 04-Apr-1945 Today's Date: 06/27/2018    History of Present Illness  Vanessa Webb is a 73 y.o. female with medical history significant of ESRD on TTS schedule, hypertension, hyperlipidemia, type 2 diabetes mellitus, on chronic anticoagulation with Coumadin presents to the hospital with chief complaint of generalized weakness.  Found to be in Afib with RVR.  CT showed large right sided pleural effusion.  pt s/p right thoracocentensis on 4/17.  Chest tube placed by IR 4/19.  Pt s/p VATS for empyema with pseudomonas 4/22.    PT Comments    Agreeable to participate.  Visibly fatigued, but worked hard.  Emphasis on transition to EOB, sitting balance/tolerance, sit to stand, ambulating with the RW, standing task at the sink for standing stamina.    Follow Up Recommendations  SNF;Supervision/Assistance - 24 hour     Equipment Recommendations  Other (comment)    Recommendations for Other Services       Precautions / Restrictions Precautions Precautions: Fall    Mobility  Bed Mobility Overal bed mobility: Needs Assistance Bed Mobility: Supine to Sit     Supine to sit: Mod assist     General bed mobility comments: assist to raise trunk  Transfers Overall transfer level: Needs assistance Equipment used: Rolling walker (2 wheeled) Transfers: Sit to/from Stand Sit to Stand: Mod assist;+2 safety/equipment         General transfer comment: assist to rise and steady, cues for hand placement  Ambulation/Gait Ambulation/Gait assistance: Mod assist;+2 safety/equipment Gait Distance (Feet): 10 Feet(x2 to/from the sink) Assistive device: Rolling walker (2 wheeled) Gait Pattern/deviations: Step-through pattern;Decreased step length - right;Decreased step length - left;Decreased stride length   Gait velocity interpretation: <1.31 ft/sec, indicative of household ambulator General Gait Details:  uncoordinated steps L worse than right, but placed in adequate positions to progress without too much instability.   Stairs             Wheelchair Mobility    Modified Rankin (Stroke Patients Only)       Balance Overall balance assessment: Needs assistance   Sitting balance-Leahy Scale: Fair Sitting balance - Comments: able to maintain sitting without assist   Standing balance support: During functional activity Standing balance-Leahy Scale: Poor Standing balance comment: min assist and one hand on sink during toothbrushing over about 2 minutes time                            Cognition Arousal/Alertness: Awake/alert Behavior During Therapy: Flat affect Overall Cognitive Status: No family/caregiver present to determine baseline cognitive functioning Area of Impairment: Problem solving                             Problem Solving: Slow processing        Exercises      General Comments General comments (skin integrity, edema, etc.): Again mildly dizzy and fatigued.      Pertinent Vitals/Pain Pain Assessment: Faces Faces Pain Scale: Hurts little more Pain Location: sacrum Pain Descriptors / Indicators: Discomfort;Sore Pain Intervention(s): Monitored during session    Home Living                      Prior Function            PT Goals (current goals can now be found in the care plan section)  Acute Rehab PT Goals Patient Stated Goal: Wants to get home as soon as she can. PT Goal Formulation: With patient Time For Goal Achievement: 07/04/18 Potential to Achieve Goals: Fair Progress towards PT goals: Progressing toward goals    Frequency    Min 2X/week      PT Plan Current plan remains appropriate    Co-evaluation PT/OT/SLP Co-Evaluation/Treatment: Yes Reason for Co-Treatment: For patient/therapist safety PT goals addressed during session: Mobility/safety with mobility OT goals addressed during session: ADL's  and self-care      AM-PAC PT "6 Clicks" Mobility   Outcome Measure  Help needed turning from your back to your side while in a flat bed without using bedrails?: A Lot Help needed moving from lying on your back to sitting on the side of a flat bed without using bedrails?: A Lot Help needed moving to and from a bed to a chair (including a wheelchair)?: A Lot Help needed standing up from a chair using your arms (e.g., wheelchair or bedside chair)?: A Lot Help needed to walk in hospital room?: A Lot Help needed climbing 3-5 steps with a railing? : Total 6 Click Score: 11    End of Session   Activity Tolerance: Patient tolerated treatment well;Patient limited by fatigue Patient left: in chair;with call bell/phone within reach;with chair alarm set Nurse Communication: Mobility status PT Visit Diagnosis: Other abnormalities of gait and mobility (R26.89);Muscle weakness (generalized) (M62.81);Difficulty in walking, not elsewhere classified (R26.2);Unsteadiness on feet (R26.81)     Time: 4403-4742 PT Time Calculation (min) (ACUTE ONLY): 37 min  Charges:  $Gait Training: 8-22 mins                     06/27/2018  Vanessa Webb, PT Acute Rehabilitation Services [phone]  (pager) [phone]  (office)   Vanessa Webb 06/27/2018, 11:17 AM

## 2018-06-27 NOTE — Telephone Encounter (Signed)
   TELEPHONE CALL NOTE  Per Dr. Marlou Porch, this patient has been deemed a candidate for follow-up tele-health visit to limit community exposure during the Covid-19 pandemic. I spoke with the patient via phone to discuss instructions. This has been outlined on the patient's AVS (dotphrase: [email or web address]). The patient was advised to review the section on consent for treatment as well. I verbally reviewed consent with her today on the phone and she has consented to treatment via virtual visit.    The patient will be going to SNF - [facility] [phone]. I called the facility to find out how they will be handling virtual office visits. (Most SNFs have identified a point of contact to be available the day of the call.) I spoke with staff member Dewitt Hoes who indicated that the best manager to discuss their process with, Jeral Fruit, is currently out of the office. She will be back next week. Therefore, I will forward this message to our [place] office to contact the SNF next week to work out the details of who will be helping to facilitate this patient's follow-up visit. Dewitt Hoes said her extension number is [phone]. The patient will also need an EKG around the time of her visit as well to determine if she is still in atrial fibrillation. Per Vilinda Flake contracts this out to a mobile company so this will need to be arranged as well when our office speaks with Deal[facility]. This is especially important since the visit is to help determine whether she will require cardioversion.  Under appointment notes, we do not yet know if this will be a video or telephone visit so I put type is TBD.   Charlie Pitter, PA-C 06/27/2018 10:52 AM

## 2018-06-27 NOTE — Discharge Instructions (Addendum)
Information on my medicine - Coumadin®   (Warfarin) ° °This medication education was reviewed with me or my healthcare representative as part of my discharge preparation.  The pharmacist that spoke with me during my hospital stay was:  Michael T Bitonti, RPH ° °Why was Coumadin prescribed for you? °Coumadin was prescribed for you because you have a blood clot or a medical condition that can cause an increased risk of forming blood clots. Blood clots can cause serious health problems by blocking the flow of blood to the heart, lung, or brain. Coumadin can prevent harmful blood clots from forming. °As a reminder your indication for Coumadin is:   Stroke Prevention Because Of Atrial Fibrillation ° °What test will check on my response to Coumadin? °While on Coumadin (warfarin) you will need to have an INR test regularly to ensure that your dose is keeping you in the desired range. The INR (international normalized ratio) number is calculated from the result of the laboratory test called prothrombin time (PT). ° °If an INR APPOINTMENT HAS NOT ALREADY BEEN MADE FOR YOU please schedule an appointment to have this lab work done by your health care provider within 7 days. °Your INR goal is usually a number between:  2 to 3 or your provider may give you a more narrow range like 2-2.5.  Ask your health care provider during an office visit what your goal INR is. ° °What  do you need to  know  About  COUMADIN? °Take Coumadin (warfarin) exactly as prescribed by your healthcare provider about the same time each day.  DO NOT stop taking without talking to the doctor who prescribed the medication.  Stopping without other blood clot prevention medication to take the place of Coumadin may increase your risk of developing a new clot or stroke.  Get refills before you run out. ° °What do you do if you miss a dose? °If you miss a dose, take it as soon as you remember on the same day then continue your regularly scheduled regimen the next  day.  Do not take two doses of Coumadin at the same time. ° °Important Safety Information °A possible side effect of Coumadin (Warfarin) is an increased risk of bleeding. You should call your healthcare provider right away if you experience any of the following: °? Bleeding from an injury or your nose that does not stop. °? Unusual colored urine (red or dark brown) or unusual colored stools (red or black). °? Unusual bruising for unknown reasons. °? A serious fall or if you hit your head (even if there is no bleeding). ° °Some foods or medicines interact with Coumadin® (warfarin) and might alter your response to warfarin. To help avoid this: °? Eat a balanced diet, maintaining a consistent amount of Vitamin K. °? Notify your provider about major diet changes you plan to make. °? Avoid alcohol or limit your intake to 1 drink for women and 2 drinks for men per day. °(1 drink is 5 oz. wine, 12 oz. beer, or 1.5 oz. liquor.) ° °Make sure that ANY health care provider who prescribes medication for you knows that you are taking Coumadin (warfarin).  Also make sure the healthcare provider who is monitoring your Coumadin knows when you have started a new medication including herbals and non-prescription products. ° °Coumadin® (Warfarin)  Major Drug Interactions  °Increased Warfarin Effect Decreased Warfarin Effect  °Alcohol (large quantities) °Antibiotics (esp. Septra/Bactrim, Flagyl, Cipro) °Amiodarone (Cordarone) °Aspirin (ASA) °Cimetidine (Tagamet) °Megestrol (Megace) °NSAIDs (ibuprofen,   naproxen, etc.) Piroxicam (Feldene) Propafenone (Rythmol SR) Propranolol (Inderal) Isoniazid (INH) Posaconazole (Noxafil) Barbiturates (Phenobarbital) Carbamazepine (Tegretol) Chlordiazepoxide (Librium) Cholestyramine (Questran) Griseofulvin Oral Contraceptives Rifampin Sucralfate (Carafate) Vitamin K   Coumadin (Warfarin) Major Herbal Interactions  Increased Warfarin Effect Decreased Warfarin Effect   Garlic Ginseng Ginkgo biloba Coenzyme Q10 Green tea St. Johns wort    Coumadin (Warfarin) FOOD Interactions  Eat a consistent number of servings per week of foods HIGH in Vitamin K (1 serving =  cup)  Collards (cooked, or boiled & drained) Kale (cooked, or boiled & drained) Mustard greens (cooked, or boiled & drained) Parsley *serving size only =  cup Spinach (cooked, or boiled & drained) Swiss chard (cooked, or boiled & drained) Turnip greens (cooked, or boiled & drained)  Eat a consistent number of servings per week of foods MEDIUM-HIGH in Vitamin K (1 serving = 1 cup)  Asparagus (cooked, or boiled & drained) Broccoli (cooked, boiled & drained, or raw & chopped) Brussel sprouts (cooked, or boiled & drained) *serving size only =  cup Lettuce, raw (green leaf, endive, romaine) Spinach, raw Turnip greens, raw & chopped   These websites have more information on Coumadin (warfarin):  FailFactory.se; VeganReport.com.au;   ----------------------------    YOUR CARDIOLOGY TEAM HAS ARRANGED FOR AN E-VISIT FOR YOUR APPOINTMENT - PLEASE REVIEW IMPORTANT INFORMATION BELOW SEVERAL DAYS PRIOR TO YOUR APPOINTMENT  Due to the recent COVID-19 pandemic, we are transitioning in-person office visits to tele-medicine visits in an effort to decrease unnecessary exposure to our patients, their families, and staff. These visits are billed to your insurance just like a normal visit is. We also encourage you to sign up for MyChart if you have not already done so. You will need a smartphone if possible. For patients that do not have this, we can still complete the visit using a regular telephone but do prefer a smartphone to enable video when possible. We also ask that you have a blood pressure cuff and scale to measure your blood pressure, heart rate and weight prior to your scheduled appointment. Patients with clinical needs that need an in-person evaluation and testing will still  be able to come to the office if absolutely necessary. If you have any questions, feel free to call our office.     IF THE VISIT IS CONDUCTED WITH A SMARTPHONE, YOUR PROVIDER WILL BE USING [email or web address] TO MEET WITH YOU BY VIDEO. The staff will give you instructions on receiving your link to join the meeting the day of your visit.     2-3 DAYS BEFORE YOUR APPOINTMENT  You will receive a telephone call from one of our [place] team members - your caller ID may say "Unknown caller." If this is a video visit, we will walk you through how to get the video launched on your phone. We will remind you check your blood pressure, heart rate and weight prior to your scheduled appointment. If you have an Apple Watch or Kardia, please upload any pertinent ECG strips the day before or morning of your appointment to [facility]. Our staff will also make sure you have reviewed the consent and agree to move forward with your scheduled tele-health visit.     THE DAY OF YOUR APPOINTMENT  Approximately 15 minutes prior to your scheduled appointment, you will receive a telephone call from one of [place] team - your caller ID may say "Unknown caller."  Our staff will confirm medications, vital signs for the day and any symptoms you may be experiencing. Please  have this information available prior to the time of visit start. It may also be helpful for you to have a pad of paper and pen handy for any instructions given during your visit. They will also walk you through joining the smartphone meeting if this is a video visit.    CONSENT FOR TELE-HEALTH VISIT - PLEASE REVIEW  I hereby voluntarily request, consent and authorize [facility] and its employed or contracted physicians, physician assistants, nurse practitioners or other licensed health care professionals (the Practitioner), to provide me with telemedicine health care services (the Services") as deemed necessary by the treating Practitioner. I acknowledge and  consent to receive the Services by the Practitioner via telemedicine. I understand that the telemedicine visit will involve communicating with the Practitioner through live audiovisual communication technology and the disclosure of certain medical information by electronic transmission. I acknowledge that I have been given the opportunity to request an in-person assessment or other available alternative prior to the telemedicine visit and am voluntarily participating in the telemedicine visit.  I understand that I have the right to withhold or withdraw my consent to the use of telemedicine in the course of my care at any time, without affecting my right to future care or treatment, and that the Practitioner or I may terminate the telemedicine visit at any time. I understand that I have the right to inspect all information obtained and/or recorded in the course of the telemedicine visit and may receive copies of available information for a reasonable fee.  I understand that some of the potential risks of receiving the Services via telemedicine include:   Delay or interruption in medical evaluation due to technological equipment failure or disruption;  Information transmitted may not be sufficient (e.g. poor resolution of images) to allow for appropriate medical decision making by the Practitioner; and/or   In rare instances, security protocols could fail, causing a breach of personal health information.  Furthermore, I acknowledge that it is my responsibility to provide information about my medical history, conditions and care that is complete and accurate to the best of my ability. I acknowledge that Practitioner's advice, recommendations, and/or decision may be based on factors not within their control, such as incomplete or inaccurate data provided by me or distortions of diagnostic images or specimens that may result from electronic transmissions. I understand that the practice of medicine is not an  exact science and that Practitioner makes no warranties or guarantees regarding treatment outcomes. I acknowledge that I will receive a copy of this consent concurrently upon execution via email to the email address I last provided but may also request a printed copy by calling the office of [facility].    I understand that my insurance will be billed for this visit.   I have read or had this consent read to me.  I understand the contents of this consent, which adequately explains the benefits and risks of the Services being provided via telemedicine.   I have been provided ample opportunity to ask questions regarding this consent and the Services and have had my questions answered to my satisfaction.  I give my informed consent for the services to be provided through the use of telemedicine in my medical care  By participating in this telemedicine visit I agree to the above.

## 2018-06-27 NOTE — Progress Notes (Signed)
Progress Note  Patient Name: Vanessa Webb Date of Encounter: 06/27/2018  Primary Cardiologist: Carlyle Dolly, MD   Subjective   Resting comfortably in bed, no chest discomfort.  Atrial fibrillation on telemetry well controlled.  Inpatient Medications    Scheduled Meds: . amiodarone  200 mg Oral BID  . atorvastatin  20 mg Oral QHS  . Chlorhexidine Gluconate Cloth  6 each Topical Q0600  . collagenase   Topical Daily  . [START ON 06/28/2018] darbepoetin (ARANESP) injection - DIALYSIS  100 mcg Intravenous Q Sat-HD  . feeding supplement  1 Container Oral TID BM  . feeding supplement (PRO-STAT SUGAR FREE 64)  30 mL Oral BID  . Gerhardt's butt cream   Topical QID  . insulin aspart  0-9 Units Subcutaneous TID WC  . metoprolol tartrate  25 mg Oral BID  . midodrine  10 mg Oral TID WC  . multivitamin  1 tablet Oral QHS  . nystatin  5 mL Oral QID  . warfarin  2 mg Oral ONCE-1800  . Warfarin - Pharmacist Dosing Inpatient   Does not apply q1800   Continuous Infusions: . sodium chloride Stopped (06/25/18 1431)  . ceFEPime (MAXIPIME) IV Stopped (06/26/18 2018)  . [START ON 06/28/2018] ceFEPime (MAXIPIME) IV     PRN Meds: sodium chloride, acetaminophen **OR** acetaminophen, acetaminophen, antiseptic oral rinse, bisacodyl, loperamide, morphine injection, ondansetron (ZOFRAN) IV, senna-docusate, traMADol   Vital Signs    Vitals:   06/27/18 0137 06/27/18 0314 06/27/18 0741 06/27/18 0915  BP:  136/61 (!) 104/57   Pulse:  88    Resp:  16    Temp:  (!) 97.4 F (36.3 C) (!) 97.4 F (36.3 C)   TempSrc:  Oral Oral   SpO2:  100% 100% 96%  Weight: 48.8 kg     Height:        Intake/Output Summary (Last 24 hours) at 06/27/2018 0957 Last data filed at 06/27/2018 0900 Gross per 24 hour  Intake 340 ml  Output 1312 ml  Net -972 ml   Last 3 Weights 06/27/2018 06/26/2018 06/26/2018  Weight (lbs) 107 lb 9.4 oz 111 lb 15.9 oz 114 lb 13.8 oz  Weight (kg) 48.8 kg 50.8 kg 52.1 kg      Telemetry    Atrial fibrillation heart rate in the 80s, no change.  Brief 1.5-second pause- Personally Reviewed  ECG    No new- Personally Reviewed  Physical Exam   GEN: Thin, resting, in no acute distress  HEENT: normal  Neck: no JVD, carotid bruits, or masses Cardiac: Irregularly irregular, normal rate; no murmurs, rubs, or gallops,no edema  Respiratory:  clear to auscultation bilaterally, normal work of breathing GI: soft, nontender, nondistended, + BS MS: no deformity or atrophy  Skin: warm and dry, no rash Neuro:  Alert and Oriented x 3, Strength and sensation are intact Psych: euthymic mood, full affect   Labs    Chemistry Recent Labs  Lab 06/24/18 0236 06/25/18 0218 06/26/18 1340 06/27/18 0546  NA 135 133* 132*  --   K 4.5 4.0 3.4*  --   CL 98 97* 98  --   CO2 26 28 24   --   GLUCOSE 74 48* 209*  --   BUN 20 27* 19  --   CREATININE 3.51* 4.40* 3.67*  --   CALCIUM 7.9* 7.6* 7.7*  --   PROT  --   --   --  5.4*  ALBUMIN 1.9* 1.8* 1.8* 1.8*  AST  --   --   --  12*  ALT  --   --   --  8  ALKPHOS  --   --   --  71  BILITOT  --   --   --  0.6  GFRNONAA 12* 9* 12*  --   GFRAA 14* 11* 13*  --   ANIONGAP 11 8 10   --      Hematology Recent Labs  Lab 06/25/18 0305 06/26/18 1838 06/27/18 0546  WBC 8.4 9.8 9.7  RBC 3.07* 3.56* 3.30*  HGB 8.0* 9.6* 8.7*  HCT 26.7* 31.1* 28.9*  MCV 87.0 87.4 87.6  MCH 26.1 27.0 26.4  MCHC 30.0 30.9 30.1  RDW 17.2* 17.3* 17.4*  PLT 91* 107* 97*    Cardiac EnzymesNo results for input(s): TROPONINI in the last 168 hours. No results for input(s): TROPIPOC in the last 168 hours.   BNPNo results for input(s): BNP, PROBNP in the last 168 hours.   DDimer No results for input(s): DDIMER in the last 168 hours.   Radiology    No results found.  Cardiac Studies   ECHO 06/11/18:  1. The left ventricle has mild-moderately reduced systolic function, with an ejection fraction of 40-45%. The cavity size was normal. There is mildly increased  left ventricular wall thickness. Left ventricular diastolic Doppler parameters are consistent  with impaired relaxation. Left ventricular diffuse hypokinesis.  2. The right ventricle has normal systolc function. Right ventricular systolic pressure is mildly elevated with an estimated pressure of 41.7 mmHg.  3. The mitral valve is degenerative. Moderate thickening of the mitral valve leaflet. Moderate calcification of the mitral valve leaflet. Mitral valve regurgitation is moderate by color flow Doppler.  4. The aortic valve is tricuspid Moderate thickening of the aortic valve Moderate calcification of the aortic valve.  5. The aortic root is normal in size and structure.  Patient Profile     73 y.o. female with end-stage renal disease on hemodialysis with empyema status post chest tube, pseudomonal infection on antibiotics IV with persistent atrial fibrillation prior difficult rate control with periodic hypotension during hemodialysis.  Assessment & Plan    Persistent atrial fibrillation - She is doing very well with rate control currently - When she is discharged from this hospitalization, decrease her amiodarone to 200 mg once a day. - After 3 weeks of INR is greater than 2 we could consider cardioversion however given her excellent rate control currently we may not need to proceed with this at this time. -No significant changes today.  End-stage renal disease on hemodialysis -midodrine surrounding hemodialysis.  Hypotension noted at times.  Blood pressures for the most part reasonable.  On low-dose metoprolol.  Empyema -Status post chest tube.   Infectious disease note reviewed.  IV antibiotics cefepime.  Chronic anticoagulation - On Coumadin.  Continue to adjust especially with amiodarone.  Goal INR 2-3.  End-stage renal disease - Hemodialysis.  She is going to skilled nursing facility today.  We will go ahead and sign off.  We will set up a virtual follow-up in 2 to 3 weeks to  determine whether or not we need to proceed with cardioversion.  Would not be unreasonable to give her an opportunity at sinus rhythm especially with the amiodarone on board.  Discussed with maintain.  For questions or updates, please contact [place] Please consult [email or web address] for contact info under      Signed, Candee Furbish, MD  06/27/2018, 9:57 AM

## 2018-06-27 NOTE — Progress Notes (Signed)
Renal Navigator received call from Dr. Elliot Gurney regarding d/c medications for patient. Renal Navigator spoke with CSW/C. Johnson to ensure that patient is able to get Rx Midodrine 10mg  at SNF. She reports she will follow up. Renal Navigator contacted OP HD clinic/[facility] to inform of patient's Rx for Cefepime 2-2-3 protocol as documented in ID/Dr. Prince Rome' note (4/30) to ensure this is a medication patient will be able to get during OP HD. Clinic staff state they have the medication, but require an H&P and Discharge Summary be faxed to them for their MD to approve before accepting patient back on this protocol. Nephrologist and CSW aware. Renal Navigator will follow closely.  Alphonzo Cruise Renal Navigator [phone]

## 2018-06-30 ENCOUNTER — Telehealth: Payer: Medicare Other | Admitting: Adult Health

## 2018-06-30 NOTE — Telephone Encounter (Signed)
See telephone note 06/27/18

## 2018-07-01 NOTE — Telephone Encounter (Signed)
Spoke with [facility] rehab who states that the pt is no longer there.

## 2018-07-01 NOTE — Telephone Encounter (Signed)
Called [facility]. No answer. No voicemail. Will try again later.

## 2018-07-10 ENCOUNTER — Inpatient Hospital Stay: Payer: Medicare Other | Admitting: Infectious Diseases

## 2018-07-16 ENCOUNTER — Telehealth: Payer: Medicare Other | Admitting: Cardiology

## 2018-07-28 LAB — ACID FAST CULTURE WITH REFLEXED SENSITIVITIES (MYCOBACTERIA): Acid Fast Culture: NEGATIVE

## 2018-07-28 DEATH — deceased

## 2019-05-01 IMAGING — DX PORTABLE CHEST - 1 VIEW
1 series · 1 of 1 positions shown · non-contrast
Comparison: 06/21/2018, 06/20/2018, 06/19/2018, CT 06/15/2018,
shoulder radiograph 03/27/2018

CLINICAL DATA: Empyema

EXAM:
PORTABLE CHEST 1 VIEW

[chest ap]
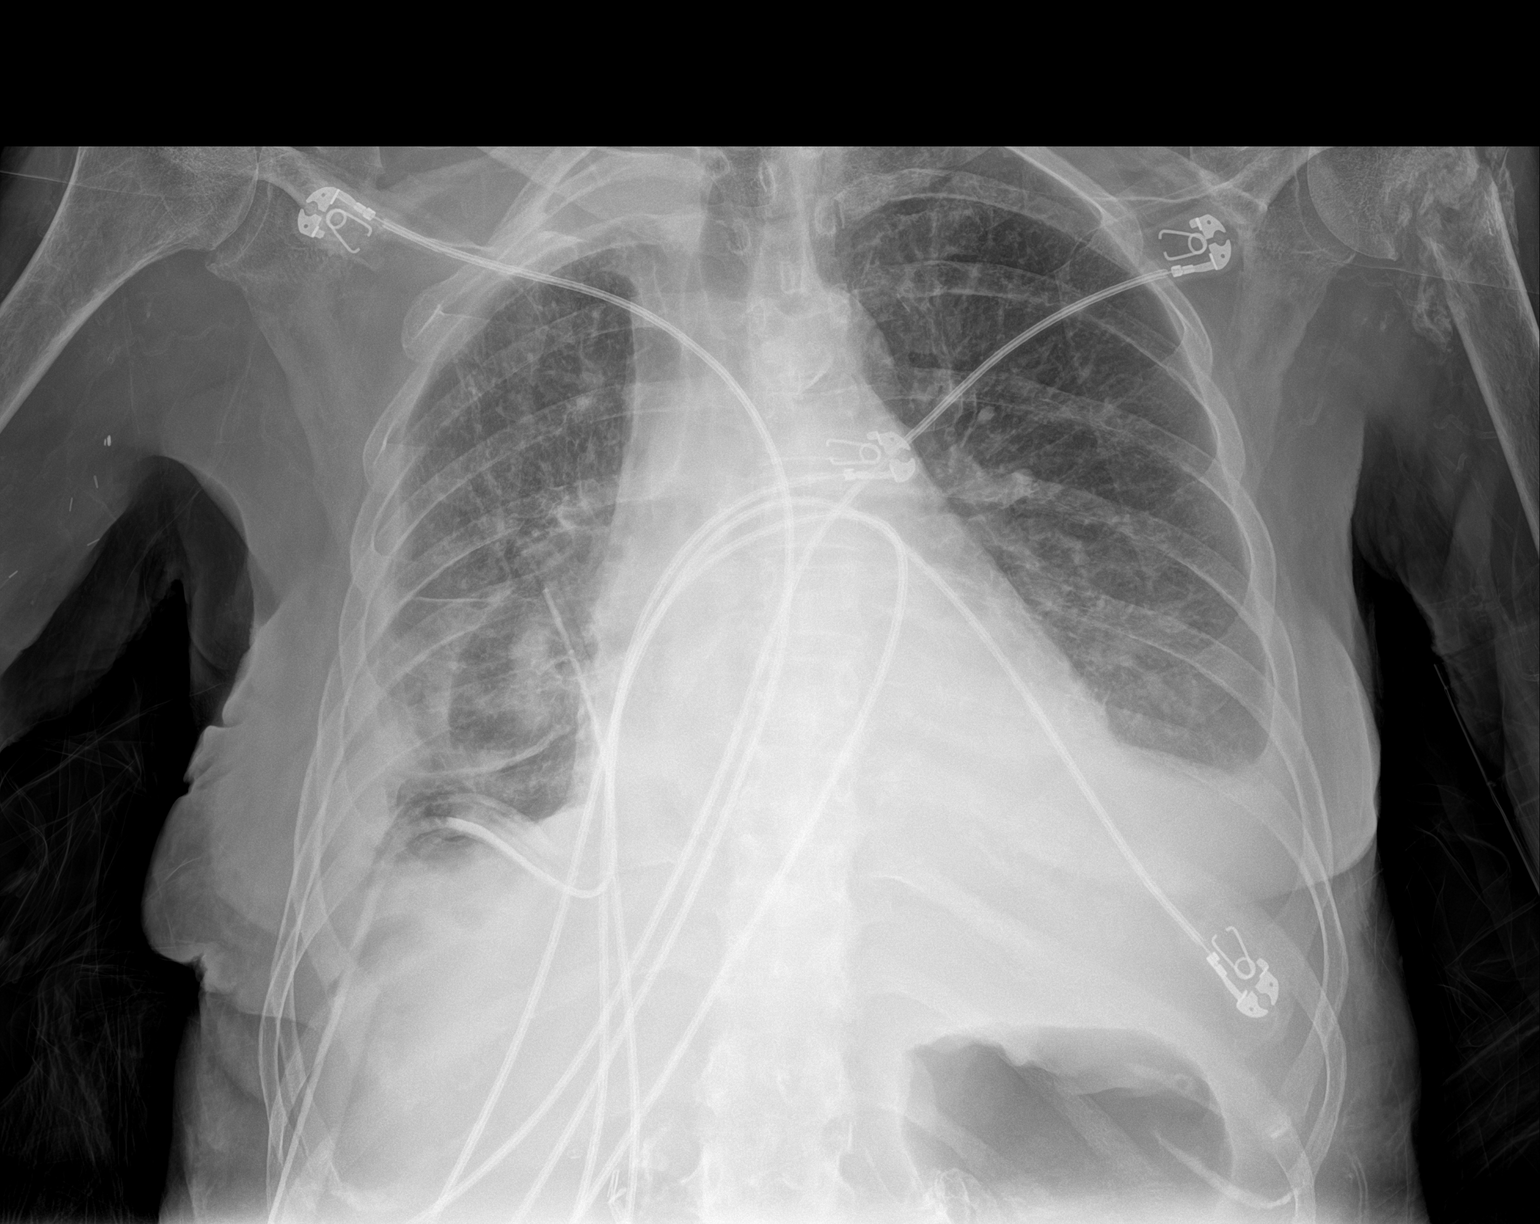

[1 of 1 positions shown; findings below may reference images not displayed]

FINDINGS: Right-sided central venous catheter has been removed. A right lower
chest tube remains in place. Appearance of decreased loculated air
collection at the right cardio phrenic sulcus. Similar appearance of
residual right pleural thickening. Small left-sided pleural
effusion. No change dense airspace disease left base and lingula.
Stable enlarged cardiomediastinal silhouette. Chronic ununited
fracture deformity of the left humerus.
IMPRESSION: 1. Removal of right-sided central venous catheter
2. No significant interval change in residual loculated right-sided
pleural thickening.
3. No change in small left effusion and left basilar airspace
disease.
4. Cardiomegaly.
